# Patient Record
Sex: Male | Born: 1940
Health system: Southern US, Community
[De-identification: ages and names within clinical notes are randomized; demographics above are authoritative.]

## PROBLEM LIST (undated history)

## (undated) DIAGNOSIS — D649 Anemia, unspecified: Secondary | ICD-10-CM

## (undated) DIAGNOSIS — I471 Supraventricular tachycardia, unspecified: Secondary | ICD-10-CM

## (undated) DIAGNOSIS — F0781 Postconcussional syndrome: Secondary | ICD-10-CM

## (undated) DIAGNOSIS — M19019 Primary osteoarthritis, unspecified shoulder: Secondary | ICD-10-CM

## (undated) DIAGNOSIS — E538 Deficiency of other specified B group vitamins: Secondary | ICD-10-CM

## (undated) DIAGNOSIS — R0602 Shortness of breath: Secondary | ICD-10-CM

## (undated) DIAGNOSIS — E039 Hypothyroidism, unspecified: Secondary | ICD-10-CM

## (undated) DIAGNOSIS — E785 Hyperlipidemia, unspecified: Secondary | ICD-10-CM

## (undated) DIAGNOSIS — R7303 Prediabetes: Secondary | ICD-10-CM

## (undated) DIAGNOSIS — K219 Gastro-esophageal reflux disease without esophagitis: Secondary | ICD-10-CM

## (undated) DIAGNOSIS — M75100 Unspecified rotator cuff tear or rupture of unspecified shoulder, not specified as traumatic: Secondary | ICD-10-CM

## (undated) DIAGNOSIS — I10 Essential (primary) hypertension: Secondary | ICD-10-CM

## (undated) DIAGNOSIS — I2510 Atherosclerotic heart disease of native coronary artery without angina pectoris: Secondary | ICD-10-CM

## (undated) HISTORY — DX: Supraventricular tachycardia: I47.1

## (undated) HISTORY — DX: Hyperlipidemia, unspecified: E78.5

## (undated) HISTORY — PX: COLONOSCOPY W/ ENDOSCOPIC US: SHX1379

## (undated) HISTORY — PX: OTHER SURGICAL HISTORY: SHX169

## (undated) HISTORY — DX: Primary osteoarthritis, unspecified shoulder: M19.019

## (undated) HISTORY — DX: Atherosclerotic heart disease of native coronary artery without angina pectoris: I25.10

## (undated) HISTORY — PX: CARPAL TUNNEL RELEASE: SHX101

## (undated) HISTORY — DX: Essential (primary) hypertension: I10

## (undated) HISTORY — PX: JOINT REPLACEMENT: SHX530

## (undated) HISTORY — PX: NASAL SINUS SURGERY: SHX719

## (undated) HISTORY — PX: RESECTION TUMOR CLAVICLE RADICAL: SUR1255

## (undated) HISTORY — PX: TRIGGER FINGER RELEASE: SHX641

## (undated) HISTORY — DX: Unspecified rotator cuff tear or rupture of unspecified shoulder, not specified as traumatic: M75.100

## (undated) HISTORY — PX: LUMBAR LAMINECTOMY: SHX95

## (undated) HISTORY — DX: Supraventricular tachycardia, unspecified: I47.10

## (undated) HISTORY — PX: BACK SURGERY: SHX140

## (undated) HISTORY — DX: Prediabetes: R73.03

## (undated) HISTORY — DX: Gastro-esophageal reflux disease without esophagitis: K21.9

---

## 1969-06-27 HISTORY — PX: LUMBAR LAMINECTOMY: SHX95

## 1998-04-10 ENCOUNTER — Ambulatory Visit (HOSPITAL_COMMUNITY): Admission: RE | Admit: 1998-04-10 | Discharge: 1998-04-10 | Payer: Self-pay | Admitting: Orthopedic Surgery

## 1998-04-18 ENCOUNTER — Encounter: Admission: RE | Admit: 1998-04-18 | Discharge: 1998-07-17 | Payer: Self-pay | Admitting: Anesthesiology

## 2000-04-20 ENCOUNTER — Emergency Department (HOSPITAL_COMMUNITY): Admission: EM | Admit: 2000-04-20 | Discharge: 2000-04-20 | Payer: Self-pay | Admitting: Emergency Medicine

## 2000-04-20 ENCOUNTER — Encounter: Payer: Self-pay | Admitting: Emergency Medicine

## 2004-06-27 HISTORY — PX: CORONARY ANGIOPLASTY WITH STENT PLACEMENT: SHX49

## 2004-07-08 ENCOUNTER — Inpatient Hospital Stay (HOSPITAL_COMMUNITY): Admission: AD | Admit: 2004-07-08 | Discharge: 2004-07-10 | Payer: Self-pay | Admitting: Cardiology

## 2004-07-14 ENCOUNTER — Inpatient Hospital Stay (HOSPITAL_COMMUNITY): Admission: EM | Admit: 2004-07-14 | Discharge: 2004-07-16 | Payer: Self-pay | Admitting: Emergency Medicine

## 2004-07-16 ENCOUNTER — Encounter (INDEPENDENT_AMBULATORY_CARE_PROVIDER_SITE_OTHER): Payer: Self-pay | Admitting: *Deleted

## 2005-09-24 ENCOUNTER — Emergency Department (HOSPITAL_COMMUNITY): Admission: EM | Admit: 2005-09-24 | Discharge: 2005-09-24 | Payer: Self-pay | Admitting: Emergency Medicine

## 2006-02-25 ENCOUNTER — Ambulatory Visit: Payer: Self-pay | Admitting: Internal Medicine

## 2006-03-19 ENCOUNTER — Ambulatory Visit (HOSPITAL_BASED_OUTPATIENT_CLINIC_OR_DEPARTMENT_OTHER): Admission: RE | Admit: 2006-03-19 | Discharge: 2006-03-19 | Payer: Self-pay | Admitting: Orthopedic Surgery

## 2006-04-21 ENCOUNTER — Emergency Department (HOSPITAL_COMMUNITY): Admission: EM | Admit: 2006-04-21 | Discharge: 2006-04-21 | Payer: Self-pay | Admitting: Emergency Medicine

## 2006-04-24 ENCOUNTER — Ambulatory Visit: Payer: Self-pay | Admitting: Internal Medicine

## 2006-06-03 ENCOUNTER — Ambulatory Visit: Payer: Self-pay | Admitting: Internal Medicine

## 2006-06-03 ENCOUNTER — Encounter (INDEPENDENT_AMBULATORY_CARE_PROVIDER_SITE_OTHER): Payer: Self-pay | Admitting: *Deleted

## 2007-07-25 ENCOUNTER — Emergency Department (HOSPITAL_COMMUNITY): Admission: EM | Admit: 2007-07-25 | Discharge: 2007-07-25 | Payer: Self-pay | Admitting: Emergency Medicine

## 2007-07-29 ENCOUNTER — Ambulatory Visit (HOSPITAL_COMMUNITY): Admission: RE | Admit: 2007-07-29 | Discharge: 2007-07-29 | Payer: Self-pay | Admitting: Internal Medicine

## 2007-09-09 ENCOUNTER — Ambulatory Visit (HOSPITAL_BASED_OUTPATIENT_CLINIC_OR_DEPARTMENT_OTHER): Admission: RE | Admit: 2007-09-09 | Discharge: 2007-09-09 | Payer: Self-pay | Admitting: Orthopedic Surgery

## 2007-12-26 HISTORY — PX: REPLACEMENT TOTAL KNEE: SUR1224

## 2008-01-24 ENCOUNTER — Inpatient Hospital Stay (HOSPITAL_COMMUNITY): Admission: RE | Admit: 2008-01-24 | Discharge: 2008-01-28 | Payer: Self-pay | Admitting: Orthopedic Surgery

## 2008-01-25 ENCOUNTER — Encounter (INDEPENDENT_AMBULATORY_CARE_PROVIDER_SITE_OTHER): Payer: Self-pay | Admitting: *Deleted

## 2008-03-08 ENCOUNTER — Encounter (INDEPENDENT_AMBULATORY_CARE_PROVIDER_SITE_OTHER): Payer: Self-pay | Admitting: *Deleted

## 2008-12-19 ENCOUNTER — Ambulatory Visit (HOSPITAL_COMMUNITY): Admission: RE | Admit: 2008-12-19 | Discharge: 2008-12-19 | Payer: Self-pay | Admitting: Cardiology

## 2008-12-19 HISTORY — PX: CARDIAC CATHETERIZATION: SHX172

## 2009-10-27 DIAGNOSIS — F0781 Postconcussional syndrome: Secondary | ICD-10-CM

## 2009-10-27 HISTORY — DX: Postconcussional syndrome: F07.81

## 2010-03-21 ENCOUNTER — Encounter: Admission: RE | Admit: 2010-03-21 | Discharge: 2010-03-21 | Payer: Self-pay | Admitting: Neurology

## 2010-06-20 ENCOUNTER — Ambulatory Visit: Payer: Self-pay | Admitting: Cardiology

## 2010-10-01 ENCOUNTER — Encounter (INDEPENDENT_AMBULATORY_CARE_PROVIDER_SITE_OTHER): Payer: Self-pay | Admitting: *Deleted

## 2010-10-04 ENCOUNTER — Ambulatory Visit: Payer: Self-pay | Admitting: Cardiology

## 2010-10-09 ENCOUNTER — Telehealth (INDEPENDENT_AMBULATORY_CARE_PROVIDER_SITE_OTHER): Payer: Self-pay | Admitting: Radiology

## 2010-10-10 ENCOUNTER — Encounter: Payer: Self-pay | Admitting: Cardiovascular Disease

## 2010-10-10 ENCOUNTER — Encounter (HOSPITAL_COMMUNITY)
Admission: RE | Admit: 2010-10-10 | Discharge: 2010-11-26 | Payer: Self-pay | Source: Home / Self Care | Attending: Cardiology | Admitting: Cardiology

## 2010-10-10 ENCOUNTER — Encounter: Payer: Self-pay | Admitting: Cardiology

## 2010-10-10 ENCOUNTER — Ambulatory Visit: Payer: Self-pay

## 2010-10-27 HISTORY — PX: CARDIOVASCULAR STRESS TEST: SHX262

## 2010-11-25 ENCOUNTER — Ambulatory Visit
Admission: RE | Admit: 2010-11-25 | Discharge: 2010-11-25 | Payer: Self-pay | Source: Home / Self Care | Attending: Internal Medicine | Admitting: Internal Medicine

## 2010-11-25 ENCOUNTER — Encounter: Payer: Self-pay | Admitting: Internal Medicine

## 2010-11-25 DIAGNOSIS — K219 Gastro-esophageal reflux disease without esophagitis: Secondary | ICD-10-CM | POA: Insufficient documentation

## 2010-11-25 DIAGNOSIS — Z860100 Personal history of colon polyps, unspecified: Secondary | ICD-10-CM | POA: Insufficient documentation

## 2010-11-25 DIAGNOSIS — K5900 Constipation, unspecified: Secondary | ICD-10-CM | POA: Insufficient documentation

## 2010-11-25 DIAGNOSIS — I2510 Atherosclerotic heart disease of native coronary artery without angina pectoris: Secondary | ICD-10-CM | POA: Insufficient documentation

## 2010-11-25 DIAGNOSIS — R079 Chest pain, unspecified: Secondary | ICD-10-CM | POA: Insufficient documentation

## 2010-11-26 NOTE — Letter (Signed)
Summary: New Patient letter  [facility]  [address] [place], [place] [address]   Phone: [phone]  Fax: [phone]       10/01/2010 MRN: [id number]  [facility] [address] [place], [place]  [address]  Dear Johnny [name],  Welcome to the Gastroenterology Division at [facility].    You are scheduled to see Dr.  Marina Goodell on 11-25-10 at 10:00a.m. on the 3rd floor at [facility], [address]  We ask that you try to arrive at our office 15 minutes prior to your appointment time to allow for check-in.  We would like you to complete the enclosed self-administered evaluation form prior to your visit and bring it with you on the day of your appointment.  We will review it with you.  Also, please bring a complete list of all your medications or, if you prefer, bring the medication bottles and we will list them.  Please bring your insurance card so that we Degner make a copy of it.  If your insurance requires a referral to see a specialist, please bring your referral form from your primary care physician.  Co-payments are due at the time of your visit and Anger be paid by cash, check or credit card.     Your office visit will consist of a consult with your physician (includes a physical exam), any laboratory testing he/she [place] order, scheduling of any necessary diagnostic testing (e.g. x-ray, ultrasound, CT-scan), and scheduling of a procedure (e.g. Endoscopy, Colonoscopy) if required.  Please allow enough time on your schedule to allow for any/all of these possibilities.    If you cannot keep your appointment, please call [phone] to cancel or reschedule prior to your appointment date.  This allows [place] the opportunity to schedule an appointment for another patient in need of care.  If you do not cancel or reschedule by 5 p.m. the business day prior to your appointment date, you will be charged a $50.00 late cancellation/no-show fee.    Thank you for choosing  [facility] for your medical needs.  We appreciate the opportunity to care for you.  Please visit [place] at our website  to learn more about our practice.                     Sincerely,                                                             The Gastroenterology Division

## 2010-11-27 ENCOUNTER — Encounter: Payer: Self-pay | Admitting: Internal Medicine

## 2010-11-28 NOTE — Therapy (Signed)
Summary: Audiology  Audiology   Imported By: Pat Kocher 10/10/2010 09:02:49  _____________________________________________________________________  External Attachment:    Type:   Image     Comment:   External Document

## 2010-11-28 NOTE — Discharge Summary (Signed)
Summary: Discharge Summary   NAME:  Johnny [name], Johnny [name] NO.:  [id number]      MEDICAL RECORD NO.:  [id number]          PATIENT TYPE:  INP      LOCATION:  4743                         FACILITY:  [facility]      PHYSICIAN:  Mila Homer. Sherlean Foot, M.D. DATE OF BIRTH:  04-02-1941      DATE OF ADMISSION:  01/24/2008   DATE OF DISCHARGE:  01/28/2008                                  DISCHARGE SUMMARY      ADMISSION DIAGNOSES:   1. End-stage osteoarthritis bilateral knees, left worse than right.   2. Hypertension.   3. Coronary artery disease with history of stents on Plavix.   4. Hyperlipidemia.   5. Chronic vitamin B12 anemia.      DISCHARGE DIAGNOSES:   1. End-stage osteoarthritis bilateral knees status post left total       knee arthroplasty.   2. Acute blood loss anemia secondary to surgery.   3. Nausea/reflux.   4. Vertigo, now resolved.   5. Supraventricular tachycardia, now resolved.   6. Constipation.   7. Hypokalemia, now resolved.   8. Hypertension.   9. Coronary artery disease with history of stents on Plavix.   10.Hyperlipidemia.   11.Chronic pernicious anemia due to vitamin B12 deficiency.      SURGICAL PROCEDURE:  On January 24, 2008, Johnny [name] underwent a left total   knee arthroplasty by Dr. Mila Homer. Lucey assisted by Legrand Pitts. Duffy, PA-   C.  He had a NexGen All-Poly size 35 poly, 9 mm thickness placed with a   NexGen Legacy knee LPS femoral component size G left.  A NexGen Legacy   knee LPS articular surface size blue GH 10 mm height with a NexGen   fluted stemmed tibial component size 7.      COMPLICATIONS:  None.      CONSULTS:   1. Physical Therapy consult January 25, 2008.   2. Cardiology consult by Dr. Swaziland January 25, 2008, in addition to a       case management consult.   3. Occupational Therapy consult January 26, 2008.      HISTORY OF PRESENT ILLNESS:  This 70 year old white male patient   presented to Dr. Sherlean Foot with history of bilateral knee pain,  left worse   than right for more than 20 years.  The left knee pain is constant,   severe, sharp, dull, stabbing, and burning sensation which is getting   worse.  It awakens him at night.  Activity and work worsen his symptoms,   and he has failed conservative treatments.  X-rays show end-stage   arthritic changes of the knee and because of this he is presenting for a   left knee replacement.      HOSPITAL COURSE:  Johnny [name] tolerated his surgical procedure well without   immediate postoperative complications.  He was transferred to 5000.   Postop day 1, he was having some nausea and reflux symptoms and had   complained that his left leg fell off the bed while  he was in the PACU   the day before.  That was unable to be verified, but an x-ray was taken   to rule out fracture which was within normal limits.  He was started on   meds for reflux.  Hemovac was discontinued from the leg.  He was started   on therapy per protocol.  During that day while he was getting up with   therapy, he actually had a run of severe tachycardia.  Because of that,   a cardiology consult was obtained from Dr. Elvis Coil office and he   followed him throughout the hospitalization.      Postop day 2, a chest x-ray the day before showed low lung volumes.  CT   of the chest was negative for PE.  Cardiac enzymes were within normal   limits.  He did convert to normal sinus rhythm after being treated with   some adenosine, and he seemed to be stable from a cardiac standpoint.   His Foley was able to be discontinued, dressing was changed to the knee,   and incision was well-approximated with staples.  He was continued on   therapy and switched to p.o. pain meds.      On postop day #3, he continued to remain stable from a cardiac   standpoint.  T-max was 99.1.  Vitals were stable.  Hemoglobin 11.2,   hematocrit 32.3, white count 11.3.  Leg was neurovascularly intact.  He   has tolerated CPM and had some constipation which  was treated with a   laxative.  Cardiology felt he needed to be monitored for another day,   but then on February 24, 2008, he was ready for discharge home.  He was   afebrile at that time, vitals stable, doing well with therapy, felt he   was ready for discharge home, and discharged home later that day.      DISCHARGE INSTRUCTIONS:  Diet:  He can resume his regular   prehospitalization diet.      MEDICATIONS:  He Schmieder resume his home meds as follows.   1. Plavix 75 mg p.o. q.a.m.   2. Mobic is on hold at this time.   3. Monopril 3 mg half a tablet p.o. q.a.m.   4. Protonix 40 mg p.o. q.a.m.   5. Aspirin 81 mg is on hold at this time until his Lovenox is       completed.   6. Lipitor 10 mg p.o. q.a.m.   7. Metoprolol 25 mg p.o. b.i.d.   8. Hydroxyzine 25 mg p.o. q.a.m.   9. Meclizine 25 mg p.o. q.6 h. p.r.n.   10.He is to hold off on the Extra Strength Tylenol at this time.   11.Vitamin B12 shot monthly.   12.Amlodipine 10 mg p.o. q.a.m.      Additional meds at this time include:   1. Lovenox 40 mg subcu at 8 a.m., last dose on February 07, 2008, and he       can resume his aspirin on February 08, 2008.   2. Celebrex 200 mg 1 tablet p.o. b.i.d. for 1 week and then 1 q.a.m.       21 with no refill.   3. Percocet 5/325 1-2 tablets p.o. q.4 h. p.r.n. for pain 60 with no       refill.   4. Robaxin 500 mg 1-2 tablets p.o. q.6 h. p.r.n. for spasms 50 with no       refill.  ACTIVITY:  He can be out of bed, weightbearing as tolerated on the left   leg with use of a walker.  He is to have home CPM 0-90 degrees 6-8 hours   a day and home health PT per [facility].  Please see the   blue total knee discharge sheet for further activity instructions.      WOUND CARE:  He Meldrum shower after no drainage from the wound for 2 days.   Please see the blue total knee discharge sheet for further wound care   instructions.      FOLLOWUP:  He needs to follow up with Dr. Sherlean Foot in our office  on   Tuesday, February 08, 2008, and needs to call [phone] for that   appointment.      LABORATORY DATA:  Chest CT done on January 25, 2008, showed no signs of   pulmonary embolism and mild-to-moderate bibasilar atelectasis.      ABG done on January 25, 2008, on room air showed a pH of 7.434, pCO2 of   36.1, pO2 of 50.6, bicarb of 23.8, and total CO2 of 24.9.      Hemoglobin/hematocrit ranged from 17 and 49.8 on January 19, 2008, to 11.2   and 32.3 on January 27, 2008.  White count went from 8.3 on January 19, 2008,   to 15.2 on January 26, 2008, to 11.3 on January 27, 2008.  Platelets remained   within normal limits.      Sodium dropped to a low of 132 on January 26, 2008.  Potassium ranged from   4.5 on January 19, 2008, to 3.2 on January 27, 2008, to 3.3 on January 28, 2008.   Glucose ranged from 98 on January 19, 2008, to 144 on January 27, 2008, to   100 on January 28, 2008.      Calcium ranged from 10.1 on January 19, 2008, to a low of 8.1 on January 26, 2008, to 8.1 on January 28, 2008.      CK, CK-MB on January 25, 2008, at 15:15 was 296 and 5 with a troponin of   0.02; at 6:10 it was 327 and 5.4 with a troponin of 0.02; and on January 26, 2008, CK was 382, CK-MB 6, troponin 0.02.  All other laboratory   studies were within normal limits.               Legrand Pitts Duffy, P.A.         ______________________________   Mila Homer. Sherlean Foot, M.D.         KED/MEDQ  D:  03/08/2008  T:  03/09/2008  Job:  [id number]

## 2010-11-28 NOTE — Procedures (Signed)
Summary: Colonoscopy   Colonoscopy  Procedure date:  06/03/2006  Findings:      Location:  [facility].  Results: Polyp. Tubular Adenoma  Patient Name: [name], [name] MRN:  Procedure Procedures: Colonoscopy CPT: [phone].    with polypectomy. CPT: [id number].  Personnel: Endoscopist: Wilhemina Bonito. Marina Goodell, MD.  Referred By: Pearletha Furl Jacky Kindle, MD.  Exam Location: Exam performed in Outpatient Clinic. Outpatient  Patient Consent: Procedure, Alternatives, Risks and Benefits discussed, consent obtained, from patient. Consent was obtained by the RN.  Indications  Increased Risk Screening: Family History of Polyps.  History  Current Medications: Patient is not currently taking Coumadin.  Comments: Patient with drug eluting stent. He has elected to stay on plavix / asa for the exam Pre-Exam Physical: Performed Jun 03, 2006. Cardio-pulmonary exam, Rectal exam, HEENT exam , Abdominal exam, Mental status exam WNL.  Comments: Pt. history reviewed/updated, physical exam performed prior to initiation of sedation?yes Exam Exam: Extent of exam reached: Cecum, extent intended: Cecum.  The cecum was identified by appendiceal orifice and IC valve. Patient position: on left side. Time to Cecum: 00:03:10. Time for Withdrawl: 00:14:45. Colon retroflexion performed. Images taken. ASA Classification: III. Tolerance: excellent.  Monitoring: Pulse and BP monitoring, Oximetry used. Supplemental O2 given.  Colon Prep Used Miralax for colon prep. Prep results: excellent.  Sedation Meds: Patient assessed and found to be appropriate for moderate (conscious) sedation. Fentanyl 50 mcg. given IV. Versed 5 mg. given IV.  Findings NORMAL EXAM: Cecum to Rectum.  POLYP: Sigmoid Colon, diminutive, Procedure:  snare without cautery, removed, retrieved, Polyp sent to pathology. ICD9: Colon Polyps: 211. 3.   Assessment  Diagnoses: 211.3: Colon Polyps.   Events  Unplanned Interventions: No  intervention was required.  Unplanned Events: There were no complications. Plans Medication Plan: Continue current medications.  Disposition: After procedure patient sent to recovery. After recovery patient sent home.  Scheduling/Referral: Colonoscopy, to Wilhemina Bonito. Marina Goodell, MD, in 5 years if polyp adenomatous; otherwise 10 years,    This report was created from the original endoscopy report, which was reviewed and signed by the above listed endoscopist.   cc:  Geoffry Paradise, MD      Peter Swaziland, MD      The Patient

## 2010-11-28 NOTE — Progress Notes (Signed)
Summary: nuc pre-procedure  Phone Note Outgoing Call   Call placed by: Harlow Asa CNMT Call placed to: Patient Reason for Call: Confirm/change Appt Summary of Call: Left message with information on [employer] Information Sheet (see scanned document for details).      Nuclear Med Background Indications for Stress Test: Evaluation for Ischemia, Stent Patency, PTCA Patency   History: Angioplasty, Heart Catheterization, Myocardial Perfusion Study, Stents  History Comments: '05 ptca-LAD; '08 MPI inf. scar with partial reversiblility/ EF=54%  Symptoms: Chest Pain, DOE, Light-Headedness    Nuclear Pre-Procedure Cardiac Risk Factors: Family History - CAD, History of Smoking, Hypertension, Lipids

## 2010-11-28 NOTE — Assessment & Plan Note (Signed)
Summary: Cardiology Nuclear Testing  Nuclear Med Background Indications for Stress Test: Evaluation for Ischemia, Stent Patency, PTCA Patency   History: Angioplasty, Heart Catheterization, Myocardial Perfusion Study, Stents  History Comments: '05 ptca-LAD; '08 MPI inf. scar with partial reversiblility/ EF=54%  Symptoms: Chest Pain, DOE, Light-Headedness, Palpitations    Nuclear Pre-Procedure Cardiac Risk Factors: Family History - CAD, History of Smoking, Hypertension, Lipids Caffeine/Decaff Intake: None NPO After: 8:00 PM Lungs: clear IV 0.9% NS with Angio Cath: 22g     IV Site: R Antecubital IV Started by: Bonnita Levan, RN Chest Size (in) 48     Height (in): 72 Weight (lb): 204 BMI: 27.77  Nuclear Med Study 1 or 2 day study:  1 day     Stress Test Type:  Stress Reading MD:  Kristeen Miss, MD     Referring MD:  P. Swaziland Resting Radionuclide:  Technetium 68m Tetrofosmin     Resting Radionuclide Dose:  11.0 mCi  Stress Radionuclide:  Technetium 39m Tetrofosmin     Stress Radionuclide Dose:  33.0 mCi   Stress Protocol Exercise Time (min):  5:01 min     Max HR:  130 bpm     Predicted Max HR:  149 bpm  Max Systolic BP: 192 mm Hg     Percent Max HR:  87.25 %     METS: 7.0 Rate Pressure Product:  [address]    Stress Test Technologist:  Milana Na, EMT-P     Nuclear Technologist:  Domenic Polite, CNMT  Rest Procedure  Myocardial perfusion imaging was performed at rest 45 minutes following the intravenous administration of Technetium 59m Tetrofosmin.  Stress Procedure  The patient exercised for 5:01. The patient stopped due to fatigue and denied any chest pain.  There were no significant ST-T wave changes and occ pvcs/pac.  Technetium 77m Tetrofosmin was injected at peak exercise and myocardial perfusion imaging was performed after a brief delay.  QPS Raw Data Images:  Normal; no motion artifact; normal heart/lung ratio. Stress Images:  There is mild attenuation of the  entire inferior wall with normal uptake in the remaining walls. Rest Images:  There is mild attenuation of the entire inferior wall with normal uptake in the remaining walls. Subtraction (SDS):  No evidence of ischemia. Transient Ischemic Dilatation:  0.97  (Normal <1.22)  Lung/Heart Ratio:  0.22  (Normal <0.45)  Quantitative Gated Spect Images QGS EDV:  138 ml QGS ESV:  64 ml QGS EF:  54 % QGS cine images:  Normal LV function.  Findings Low risk nuclear study      Overall Impression  Exercise Capacity: Fair exercise capacity. BP Response: Normal blood pressure response. Clinical Symptoms: No chest pain ECG Impression: No significant ST segment change suggestive of ischemia. Overall Impression: Low risk stress nuclear study. Overall Impression Comments: There is no evidence of ischemia.  There is mild attenuation of the inferior wall that Horsman be due to diaphramatic attenuation.  The LV systolic function is normal.  Appended Document: Cardiology Nuclear Testing copy sent to Dr.Jordan

## 2010-12-04 NOTE — Assessment & Plan Note (Signed)
Summary: Chest pain, GERD, colon polyps   History of Present Illness Visit Type: Initial Consult Primary GI MD: Yancey Flemings MD Primary Provider: Geoffry Paradise, MD Requesting Provider:  Dr Geoffry Paradise Chief Complaint: Patient here to discuss having an endoscopy per Dr Lanell Matar request. Patient states that he has occasional chest pain and reflux although he thinks it is fairly well controlled on protonix. History of Present Illness:   70 year old white male with a history of hypertension, coronary artery disease status post coronary artery stent placement remotely, GERD, adenomatous colon polyps,and osteoarthritis. He is accompanied by his wife. He has had today regarding GERD and chest pain, and the need for upper endoscopy. Patient was last seen in August of 2007 when he underwent colonoscopy because of a family history of colon polyps. He was found to have sigmoid adenoma which was removed. Followup in 5 years recommended. In terms of chest pain, he states that he has had this since his stent placement 6 years ago. He does have chronic GERD for which he takes protonix. He describes chest pain as left-sided as well as the chest pressure. States he has this most days. He does note as this was certain activities. There is no effect with meals. No dysphagia. Increasing PPI b.i.d. does not help. He has undergone recent cardiac evaluation with Dr. Swaziland regarding his pain. He and his wife tell me that that evaluation was negative. His lower GI complaint is that of constipation for which he has been taking Dulcolax. No bleeding. He has come off Plavix in the past for procedure work including knee replacement.   GI Review of Systems    Reports acid reflux and  chest pain.      Denies abdominal pain, belching, bloating, dysphagia with liquids, dysphagia with solids, heartburn, loss of appetite, nausea, vomiting, vomiting blood, weight loss, and  weight gain.      Reports constipation.     Denies  anal fissure, black tarry stools, change in bowel habit, diarrhea, diverticulosis, fecal incontinence, heme positive stool, hemorrhoids, irritable bowel syndrome, jaundice, light color stool, liver problems, rectal bleeding, and  rectal pain.    Current Medications (verified): 1)  Cyanocobalamin 1000 Mcg/ml Soln (Cyanocobalamin) .[name].. 1 Injection Im Monthly 2)  Hydroxyzine Hcl 25 Mg Tabs (Hydroxyzine Hcl) .[name].. 1 By Mouth Once Daily 3)  Klor-Con 10 10 Meq Cr-Tabs (Potassium Chloride) .[name].. 1 By Mouth Once Daily 4)  Lipitor 10 Mg Tabs (Atorvastatin Calcium) .[name].. 1 By Mouth Once Daily 5)  Lisinopril 20 Mg Tabs (Lisinopril) .[name].. 1 By Mouth Once Daily 6)  Meclizine Hcl 25 Mg Tabs (Meclizine Hcl) .[name].. 1 By Mouth 2-3 Times Per Day As Needed 7)  Metoprolol Tartrate 25 Mg Tabs (Metoprolol Tartrate) .... Take 1 Tablet By Mouth Two Times A Day 8)  Mobic 15 Mg Tabs (Meloxicam) .[name].. 1 By Mouth Once Daily 9)  Plavix 75 Mg Tabs (Clopidogrel Bisulfate) .[name].. 1 By Mouth Once Daily 10)  Protonix 40 Mg Tbec (Pantoprazole Sodium) .[name].. 1 By Mouth Once Daily 11)  Aspirin 81 Mg Tbec (Aspirin) .[name].. 1 By Mouth Once Daily  Allergies (verified): No Known Drug Allergies  Past History:  Past Medical History: ASCAD IGT HTN Osteoarthritis Carpel Tunnel GERD Anemia Heart Arrythmia Chronic Headaches  Past Surgical History: Cardiac Stent Placement Back Surgery Bilateral Hand Surgery Sinus Surgery x 2 Left Knee Replacement  Family History: Family History of Colon Polyps: Brother? Family History of Diabetes: Father  Social History: Patient is a former smoker. -stopped  38 years ago Alcohol Use - no Illicit Drug Use - no  Review of Systems       The patient complains of arthritis/joint pain, headaches-new, hearing problems, heart rhythm changes, itching, muscle pains/cramps, and thirst - excessive.  The patient denies allergy/sinus, anemia, anxiety-new, back pain, blood in urine, breast changes/lumps, change  in vision, confusion, cough, coughing up blood, depression-new, fainting, fatigue, fever, heart murmur, menstrual pain, night sweats, nosebleeds, pregnancy symptoms, shortness of breath, skin rash, sleeping problems, sore throat, swelling of feet/legs, swollen lymph glands, urination - excessive, urination changes/pain, urine leakage, vision changes, and voice change.    Vital Signs:  Patient profile:   70 year old male Height:      72 inches Weight:      210.25 pounds BMI:     28.62 BSA:     2.18 Pulse rate:   72 / minute Pulse rhythm:   regular BP sitting:   150 / 80  (left arm)  Vitals Entered By: Lamona Curl CMA Duncan Dull) (November 25, 2010 9:36 AM)  Physical Exam  General:  Well developed, well nourished, no acute distress. Head:  Normocephalic and atraumatic. Eyes:  PERRLA, no icterus. Mouth:  No deformity or lesions. Neck:  Supple; no masses or thyromegaly. Lungs:  Clear throughout to auscultation. Heart:  Regular rate and rhythm; no murmurs, rubs,  or bruits. Abdomen:  Soft, nontender and nondistended. No masses, hepatosplenomegaly or hernias noted. Normal bowel sounds. Rectal:  deferred Msk:  Symmetrical with no gross deformities. Normal posture. Pulses:  Normal pulses noted. Extremities:  No clubbing, cyanosis, edema or deformities noted. Neurologic:  Alert and  oriented x4;  grossly normal neurologically. Skin:  Intact without significant lesions or rashes. Psych:  Alert and cooperative. Somewhat anxious   Impression & Recommendations:  Problem # 1:  CHEST PAIN UNSPECIFIED (ICD-786.50) chronic chest pain. History of coronary artery disease. Recent negative cardiac evaluation reported. History of chronic GERD. Uncertain the nature of his chest pain. Does not have specific GI complaints and does not respond to high-dose PPI. No prior upper endoscopy in a 70 year old white male with a greater than 20 year history of GERD.  Plan: #1. Upper endoscopy to evaluate  chest pain as well as provide Barrett's screening. The nature of the procedure as well as the risks, benefits, and alternatives were reviewed. He understood and agreed to proceed  Problem # 2:  GERD (ICD-530.81) Plan: #1. Reflux precautions #2. Continue Protonix #3. Upper endoscopy planned. See above  Problem # 3:  CONSTIPATION (ICD-564.00) likely functional. Last colonoscopy 5 years ago as described. Okay to use Ducolax on demand. Could also use MiraLax  Problem # 4:  PERSONAL HISTORY OF COLONIC POLYPS (ICD-V12.72) history of adenomatous colon polyp. Due for followup this year. We will schedule his surveillance colonoscopy at this time along with his upper endoscopy for his convenience. The nature of colonoscopy as well as risks, benefits, and alternatives were again reviewed. He understood and agreed to proceed. Movi prep prescribed. The patient instructed on its use. We will need to address his Plavix therapy. He is interested in holding Plavix if okay with his cardiologist, Dr. Swaziland. We will send Dr. Swaziland a letter regarding the feasibility of holding Plavix, while continuing aspirin, for his procedure work.  Problem # 5:  CAD (ICD-414.00) stable with negative and recent workup. Relevant issue is Plavix therapy and his procedure were. We will check with Dr. Swaziland to see if it is okay to hold Plavix for  about 5 days prior to his procedures, while continuing aspirin.  Other Orders: Colon/Endo (Colon/Endo)  Patient Instructions: 1)  Colon/Endo [facility] 12/17/10 2:00 pm arrive at 1:00 pm on 4th floor. 2)  Movi prep instructions given. 3)  Movi prep Rx sent to your pharmacy for you to pick up. 4)  We will send a letter to Dr. Peter Swaziland for approval to hold your Plavix x 5 days.   Stay on your Aspirin.   5)  Copy sent to : Geoffry Paradise, MD  Peter Swaziland, MD 6)  Colonoscopy and Flexible Sigmoidoscopy brochure given.  7)  Upper Endoscopy brochure given.  8)  The medication list was  reviewed and reconciled.  All changed / newly prescribed medications were explained.  A complete medication list was provided to the patient / caregiver. Prescriptions: MOVIPREP 100 GM  SOLR (PEG-KCL-NACL-NASULF-NA ASC-C) As per prep instructions.  #1 x 0   Entered by:   Milford Cage NCMA   Authorized by:   Hilarie Fredrickson MD   Signed by:   Milford Cage NCMA on 11/25/2010   Method used:   Electronically to        [facility]* (retail)       [address]       [place], [place]  [address]       Ph: [phone]       Fax: [phone]   RxID:   [id number]

## 2010-12-04 NOTE — Letter (Signed)
Summary: [facility]  [facility]  [address] [place], [place] [address]   Phone: [phone]  Fax: [phone]       Johnny [name]    [birth year]    MRN: [id number]        Procedure Day /Date:TUESDAY, 12/17/10     Arrival Time:1:00 PM     Procedure Time:2:00 PM     Location of Procedure:                    X  [facility] (4th Floor)   PREPARATION FOR COLONOSCOPY WITH MOVIPREP/ENDO   Starting 5 days prior to your procedure 12/12/10 do not eat nuts, seeds, popcorn, corn, beans, peas,  salads, or any raw vegetables.  Do not take any fiber supplements (e.g. Metamucil, Citrucel, and Benefiber).  THE DAY BEFORE YOUR PROCEDURE         DATE: 12/16/10  DAY: MONDAY  1.  Drink clear liquids the entire day-NO SOLID FOOD  2.  Do not drink anything colored red or purple.  Avoid juices with pulp.  No orange juice.  3.  Drink at least 64 oz. (8 glasses) of fluid/clear liquids during the day to prevent dehydration and help the prep work efficiently.  CLEAR LIQUIDS INCLUDE: Water Jello Ice Popsicles Tea (sugar ok, no milk/cream) Powdered fruit flavored drinks Coffee (sugar ok, no milk/cream) Gatorade Juice: apple, white grape, white cranberry  Lemonade Clear bullion, consomm, broth Carbonated beverages (any kind) Strained chicken noodle soup Hard Candy                             4.  In the morning, mix first dose of MoviPrep solution:    Empty 1 Pouch A and 1 Pouch B into the disposable container    Add lukewarm drinking water to the top line of the container. Mix to dissolve    Refrigerate (mixed solution should be used within 24 hrs)  5.  Begin drinking the prep at 5:00 p.m. The MoviPrep container is divided by 4 marks.   Every 15 minutes drink the solution down to the next mark (approximately 8 oz) until the full liter is complete.   6.  Follow completed prep with 16 oz of clear liquid of your choice (Nothing red or purple).  Continue  to drink clear liquids until bedtime.  7.  Before going to bed, mix second dose of MoviPrep solution:    Empty 1 Pouch A and 1 Pouch B into the disposable container    Add lukewarm drinking water to the top line of the container. Mix to dissolve    Refrigerate  THE DAY OF YOUR PROCEDURE      DATE: 12/17/10 DAY: TUESDAY  Beginning at 9:00 a.m. (5 hours before procedure):         1. Every 15 minutes, drink the solution down to the next mark (approx 8 oz) until the full liter is complete.  2. Follow completed prep with 16 oz. of clear liquid of your choice.    3. You Buehner drink clear liquids until 12 NOON (2 HOURS BEFORE PROCEDURE).   MEDICATION INSTRUCTIONS  Unless otherwise instructed, you should take regular prescription medications with a small sip of water   as early as possible the morning of your procedure.   Stop taking Plavix 12/12/10  (5 days before procedure).   WE WILL SEND DR. Swaziland A LETTER FOR APPROVAL TO  HOLD PLAVIX.  WE WILL CONTACT YOU REGARDING THIS.  IF YOU HAVEN'T HEARD FROM OUR OFFICE WITHIN 1 WEEK PRIOR TO PROCEDURE, PLEASE CALL OUR OFFICE.        OTHER INSTRUCTIONS  You will need a responsible adult at least 72 years of age to accompany you and drive you home.   This person must remain in the waiting room during your procedure.  Wear loose fitting clothing that is easily removed.  Leave jewelry and other valuables at home.  However, you Roberti wish to bring a book to read or  an iPod/MP3 player to listen to music as you wait for your procedure to start.  Remove all body piercing jewelry and leave at home.  Total time from sign-in until discharge is approximately 2-3 hours.  You should go home directly after your procedure and rest.  You can resume normal activities the  day after your procedure.  The day of your procedure you should not:   Drive   Make legal decisions   Operate machinery   Drink alcohol   Return to work  You will receive  specific instructions about eating, activities and medications before you leave.    The above instructions have been reviewed and explained to me by   _______________________    I fully understand and can verbalize these instructions _____________________________ Date _________

## 2010-12-04 NOTE — Letter (Signed)
Summary: Anticoagulation Modification Letter  [facility]  [address] [place], [place] [address]   Phone: [phone]  Fax: [phone]    November 25, 2010  Re:    Johnny [name] DOB:    1941/06/12 MRN:    [id number]    Dear Peter Swaziland, MD:  We have scheduled the above patient for an Endo/Colon procedure. Our records show that  he is on anticoagulation therapy. Please advise as to how long the patient [name] come off their therapy of Plavix prior to the scheduled procedure(s) on 12/17/10.   Please fax back/or route the completed form to Milford Cage, CMA at [phone].  Thank you for your help with this matter.  Sincerely,    Wilhemina Bonito. Marina Goodell, MD   Physician Recommendation:  Hold Plavix 5 days prior ________________   Other  Stay on Aspirin  Appended Document: Anticoagulation Modification Letter Ok'd by Dr. Swaziland to hold Plavix x 5 days and stay on Aspirin.  Pt. notified.

## 2010-12-12 NOTE — Letter (Signed)
Summary: Anticoag  Anticoag   Imported By: Lester Cordaville 12/04/2010 07:27:10  _____________________________________________________________________  External Attachment:    Type:   Image     Comment:   External Document

## 2010-12-17 ENCOUNTER — Encounter: Payer: Self-pay | Admitting: Internal Medicine

## 2010-12-17 ENCOUNTER — Encounter (AMBULATORY_SURGERY_CENTER): Payer: Medicare Other | Admitting: Internal Medicine

## 2010-12-17 DIAGNOSIS — Z1211 Encounter for screening for malignant neoplasm of colon: Secondary | ICD-10-CM

## 2010-12-17 DIAGNOSIS — Z860100 Personal history of colon polyps, unspecified: Secondary | ICD-10-CM

## 2010-12-17 DIAGNOSIS — K222 Esophageal obstruction: Secondary | ICD-10-CM

## 2010-12-17 DIAGNOSIS — K219 Gastro-esophageal reflux disease without esophagitis: Secondary | ICD-10-CM

## 2010-12-18 NOTE — Consult Note (Signed)
Summary: [facility]   Imported By: Sherian Rein 12/11/2010 06:56:08  _____________________________________________________________________  External Attachment:    Type:   Image     Comment:   External Document

## 2010-12-24 NOTE — Procedures (Signed)
Summary: Upper Endoscopy  Patient: [name] Note: All result statuses are Final unless otherwise noted.  Tests: (1) Upper Endoscopy (EGD)   EGD Upper Endoscopy       DONE     [facility]     [address]     [place], [place]  [address]           ENDOSCOPY PROCEDURE REPORT           PATIENT:  [name], Johnny  MR#:  [id number]     BIRTHDATE:  [birth year], 69 yrs. old  GENDER:  male           ENDOSCOPIST:  Wilhemina Bonito. Eda Keys, MD     Referred by:  Office           PROCEDURE DATE:  12/17/2010     PROCEDURE:  EGD, diagnostic [address]     ASA CLASS:  Class III     INDICATIONS:  GERD, Screening for Barrett's esopahgus in a GERD     patient., CHRONIC atypical chest pain           MEDICATIONS:   There was residual sedation effect present from     prior procedure.     TOPICAL ANESTHETIC:  Exactacain Spray           DESCRIPTION OF PROCEDURE:   After the risks benefits and     alternatives of the procedure were thoroughly explained, informed     consent was obtained.  The LB [id number]-H180 [id number] endoscope was     introduced through the mouth and advanced to the second portion of     the duodenum, without limitations.  The instrument was slowly     withdrawn as the mucosa was fully examined.     <<PROCEDUREIMAGES>>           A large caliber ring-like stricture was found in the distal     esophagus.  Otherwise normal esophagus without Barrett's.  The     stomach was entered and closely examined. The antrum, angularis,     and lesser curvature were well visualized, including a retroflexed     view of the cardia and fundus. The stomach wall was normally     distensable. The scope passed easily through the pylorus into the     duodenum.  The duodenal bulb was normal in appearance, as was the     postbulbar duodenum.    Retroflexed views revealed a small hiatal     hernia.    The scope was then withdrawn from the patient and the     procedure completed.           COMPLICATIONS:  None             ENDOSCOPIC IMPRESSION:     1) Stricture in the distal esophagus     2) Otherwise normal esophagus     3) Normal stomach     4) Normal duodenum     5) Gerd           RECOMMENDATIONS:     1) Anti-reflux regimen to be followed     2) Continue PPI (reflux medication0           _____________________________     Wilhemina Bonito. Eda Keys, MD           CC:  Geoffry Paradise, MD;Peter Swaziland, MD;The Patient           n.     eSIGNED:  Wilhemina Bonito. Eda Keys at 12/17/2010 03:08 PM           Johnny [name], Johnny [name], [id number]  Note: An exclamation mark (!) indicates a result that was not dispersed into the flowsheet. Document Creation Date: 12/17/2010 3:08 PM _______________________________________________________________________  (1) Order result status: Final Collection or observation date-time: 12/17/2010 15:01 Requested date-time:  Receipt date-time:  Reported date-time:  Referring Physician:   Ordering Physician: Fransico Setters [phone]) Specimen Source:  Source: Launa Grill Order Number: [id number] Lab site:

## 2010-12-24 NOTE — Procedures (Signed)
Summary: Colonoscopy  Patient: Johnny [name] Note: All result statuses are Final unless otherwise noted.  Tests: (1) Colonoscopy (COL)   COL Colonoscopy           DONE     [facility]     [address]     [place], [place]  [address]           COLONOSCOPY PROCEDURE REPORT           PATIENT:  [name], [name]  MR#:  [id number]     BIRTHDATE:  [birth year], 69 yrs. old  GENDER:  male     ENDOSCOPIST:  Wilhemina Bonito. Eda Keys, MD     REF. BY:  Office     PROCEDURE DATE:  12/17/2010     PROCEDURE:  Surveillance Colonoscopy     ASA CLASS:  Class III     INDICATIONS:  history of pre-cancerous (adenomatous) colon polyps,     surveillance and high-risk screening ; index 05-2006 w/ TAs     MEDICATIONS:   Fentanyl 75 mcg IV, Versed 7 mg IV           DESCRIPTION OF PROCEDURE:   After the risks benefits and     alternatives of the procedure were thoroughly explained, informed     consent was obtained.  Digital rectal exam was performed and     revealed no abnormalities.   The LB 180AL [id number] endoscope was     introduced through the anus and advanced to the cecum, which was     identified by both the appendix and ileocecal valve, without     limitations.Time to cecum = 5:00 min.  The quality of the prep was     excellent, using MoviPrep.  The instrument was then slowly     withdrawn (time =9:07 min) as the colon was fully examined.     <<PROCEDUREIMAGES>>           FINDINGS:  A normal appearing cecum, ileocecal valve, and     appendiceal orifice were identified. The ascending, hepatic     flexure, transverse, splenic flexure, descending, sigmoid colon,     and rectum appeared unremarkable.   Retroflexed views in the     rectum revealed internal hemorrhoids.    The scope was then     withdrawn from the patient and the procedure completed.           COMPLICATIONS:  None           ENDOSCOPIC IMPRESSION:     1) Normal colon     2) Internal hemorrhoids           RECOMMENDATIONS:     1) Follow up  colonoscopy in 5 years (hx adenomas)     2) RESUME PLAVIX TODAY           ______________________________     Wilhemina Bonito. Eda Keys, MD           CC:  Geoffry Paradise, MD;Peter Swaziland, MD;The Patient           n.     Rosalie DoctorWilhemina Bonito. Eda Keys at 12/17/2010 02:57 PM           [name], [name], [id number]  Note: An exclamation mark (!) indicates a result that was not dispersed into the flowsheet. Document Creation Date: 12/17/2010 2:57 PM _______________________________________________________________________  (1) Order result status: Final Collection or observation date-time: 12/17/2010 14:51 Requested date-time:  Receipt date-time:  Reported date-time:  Referring Physician:   Ordering Physician: Fransico Setters [phone]) Specimen Source:  Source: Launa Grill Order Number: [id number] Lab site:   Appended Document: Colonoscopy    Clinical Lists Changes  Observations: Added new observation of COLONNXTDUE: 11/2015 (12/18/2010 8:16)

## 2011-01-22 ENCOUNTER — Telehealth: Payer: Self-pay | Admitting: *Deleted

## 2011-01-22 NOTE — Telephone Encounter (Signed)
Called stating he is having rotator cuff surgery and needs to know if he should stop Plavix and ASA. Per Dr. Swaziland stop Plavix 5 days before surgery but needs to stay on ASA 81 mg

## 2011-01-23 ENCOUNTER — Telehealth: Payer: Self-pay | Admitting: *Deleted

## 2011-01-23 NOTE — Telephone Encounter (Signed)
Called stating he is having rotator cuff surgery and wants to know if should stop Plavix. Per Dr. Swaziland Knueppel stop 5 days prior to procedure but needs to continue ASA

## 2011-01-30 ENCOUNTER — Telehealth: Payer: Self-pay | Admitting: Cardiology

## 2011-01-30 NOTE — Telephone Encounter (Signed)
PRE SURGICAL TESTING AT  ASKING FOR: LAST OV NOTE,EKG,ANY CARDIAC TESTING THAT HAS  BEEN DONE (AS IN [facility] STUDY)

## 2011-02-04 ENCOUNTER — Other Ambulatory Visit: Payer: Self-pay | Admitting: Orthopedic Surgery

## 2011-02-04 ENCOUNTER — Encounter (HOSPITAL_COMMUNITY): Payer: Medicare Other

## 2011-02-04 LAB — CBC
MCH: 30.9 pg (ref 26.0–34.0)
MCHC: 34.4 g/dL (ref 30.0–36.0)
MCV: 89.8 fL (ref 78.0–100.0)
Platelets: 215 10*3/uL (ref 150–400)
RDW: 12.3 % (ref 11.5–15.5)
WBC: 8.3 10*3/uL (ref 4.0–10.5)

## 2011-02-04 LAB — BASIC METABOLIC PANEL
BUN: 15 mg/dL (ref 6–23)
Calcium: 9.5 mg/dL (ref 8.4–10.5)
Creatinine, Ser: 0.97 mg/dL (ref 0.4–1.5)
GFR calc non Af Amer: >60 mL/min (ref >60)
Potassium: 4.2 mEq/L (ref 3.5–5.1)

## 2011-02-04 LAB — SURGICAL PCR SCREEN: Staphylococcus aureus: POSITIVE — AB

## 2011-02-11 ENCOUNTER — Ambulatory Visit (HOSPITAL_COMMUNITY): Admission: RE | Admit: 2011-02-11 | Payer: Medicare Other | Source: Ambulatory Visit | Admitting: Orthopedic Surgery

## 2011-02-11 ENCOUNTER — Inpatient Hospital Stay (HOSPITAL_COMMUNITY)
Admission: RE | Admit: 2011-02-11 | Discharge: 2011-02-13 | DRG: 497 | Disposition: A | Discharge status: Home / Self Care | Payer: Medicare Other | Source: Ambulatory Visit | Attending: Orthopedic Surgery | Admitting: Orthopedic Surgery

## 2011-02-11 DIAGNOSIS — S43429A Sprain of unspecified rotator cuff capsule, initial encounter: Secondary | ICD-10-CM | POA: Diagnosis present

## 2011-02-11 DIAGNOSIS — Z9861 Coronary angioplasty status: Secondary | ICD-10-CM

## 2011-02-11 DIAGNOSIS — S43439A Superior glenoid labrum lesion of unspecified shoulder, initial encounter: Secondary | ICD-10-CM | POA: Diagnosis present

## 2011-02-11 DIAGNOSIS — S43499A Other sprain of unspecified shoulder joint, initial encounter: Secondary | ICD-10-CM | POA: Diagnosis present

## 2011-02-11 DIAGNOSIS — Z01818 Encounter for other preprocedural examination: Secondary | ICD-10-CM

## 2011-02-11 DIAGNOSIS — I10 Essential (primary) hypertension: Secondary | ICD-10-CM | POA: Diagnosis present

## 2011-02-11 DIAGNOSIS — M249 Joint derangement, unspecified: Principal | ICD-10-CM | POA: Diagnosis present

## 2011-02-11 DIAGNOSIS — M19019 Primary osteoarthritis, unspecified shoulder: Secondary | ICD-10-CM | POA: Diagnosis present

## 2011-02-11 DIAGNOSIS — M25819 Other specified joint disorders, unspecified shoulder: Secondary | ICD-10-CM | POA: Diagnosis present

## 2011-02-11 DIAGNOSIS — I2510 Atherosclerotic heart disease of native coronary artery without angina pectoris: Secondary | ICD-10-CM | POA: Diagnosis present

## 2011-02-11 DIAGNOSIS — X58XXXA Exposure to other specified factors, initial encounter: Secondary | ICD-10-CM | POA: Diagnosis present

## 2011-02-11 DIAGNOSIS — IMO0002 Reserved for concepts with insufficient information to code with codable children: Secondary | ICD-10-CM | POA: Diagnosis present

## 2011-02-11 HISTORY — PX: SHOULDER SURGERY: SHX246

## 2011-02-27 NOTE — Op Note (Signed)
Johnny [name], Johnny [name]                   ACCOUNT NO.:  [id number]  MEDICAL RECORD NO.:  [id number]           PATIENT TYPE:  I  LOCATION:  1614                         FACILITY:  [facility]  PHYSICIAN:  Marlowe Kays, M.D.  DATE OF BIRTH:  August 13, 1941  DATE OF PROCEDURE:  02/11/2011 DATE OF DISCHARGE:                              OPERATIVE REPORT   PREOPERATIVE DIAGNOSES: 1. Labral tear. 2. Suspected partial complete biceps tendon tear. 3. Acromioclavicular joint arthritis. 4. Rotator cuff tear, right shoulder.  POSTOPERATIVE DIAGNOSES: 1. Labral tear. 2. Suspected partial complete biceps tendon tear. 3. Acromioclavicular joint arthritis. 4. Rotator cuff tear, right shoulder.  OPERATIONS: 1. Right shoulder arthroscopy with labral debridement and debridement     of probable remnant of the biceps tendon. 2. Open distal clavicle resection. 3. Open anterior acromionectomy and repair of torn rotator cuff.  SURGEON:  Marlowe Kays, MD  ASSISTANT:  Druscilla Brownie. Idolina Primer, [place]  ANESTHESIA:  General.  INDICATION FOR PROCEDURE:  Because of painful right shoulder, MRI was done demonstrating preoperative diagnoses.  He has been cleared for surgery.  He has been off his Plavix for 5 days and it was felt that he could have the above-mentioned surgery.  PROCEDURE IN DETAIL:  Prophylactic antibiotics, satisfactory general anesthesia, beach-chair position on the Allen frame, right shoulder was prepped with DuraPrep and draped in sterile field.  Time-out performed. Anatomy of the shoulder joint was marked out as well as planned incision for the distal clavicle resection and rotator cuff repair.  Through a posterior soft spot portal entered atraumatically into the glenohumeral joint.  Initially, there was a large amount of clotted blood, secondary to his Plavix which Auletta not have been cleared from the system completely.  By flushing the joint, I was eventually able to clear most of the clotted  material and enough visualization to see a very frayed labrum.  I really could not make out the interval between the biceps tendon subscapularis but advanced the scope anteriorly using a switching stick, made an anterior incision, and over the switching stick, I placed a metal cannula in the joint followed by 4.2 shaver.  This allowed me to clear out the clotted material and shave down the labrum, what probably was the remnant of the biceps tendon I shaved down as well.  We then kept the camera operative and I made incision over the distal clavicle curving down over the anterior acromion.  The very arthritic AC joint was identified and measured centimeter and a half medial to it and then undermined the clavicle at this point using micro saw and protecting the underlying tissues, amputated clavicle at this point.  I then removed this with towel clip and cutting cautery technique.  Applied bone wax to the raw bone.  I then continued the dissection distally with subperiosteal dissection using cutting cautery, exposing the anterior acromion, which I undermined with a small Cobb elevator and joint fluid came forth confirming the tear.  I placed a larger Cobb elevator and made initial anterior acromionectomy with micro saw.  I then supplemented this with another pass with the saw  and using small rongeur and rasp, widely decompressed the subacromial space.  A triangular- shaped rotator cuff tear with retraction was noted.  It is about 2 cm at the base, it is about 2 cm at the apex.  The posterior wing of this was quite thinned out and also noted vertical tear way up beneath the distal clavicle.  After roughening up the area just above the greater tuberosity with a small rongeur, I used a 4-strand Stryker anchor, tacking down the rotator cuff.  I then used the same suture with multiple side-to-side sutures including several side-to-side sutures on the surface of the clavicle.  This seemed to give  a nice stable repair with his arm to a side.  Pre and post repair pictures were also taken. I then irrigated the wound well with sterile saline and placed Gelfoam in the distal clavicle resection site and then closed the wound with interrupted #1 Vicryl in the fascia over the distal clavicle and acromion, also repairing the small slit at the deltoid muscle.  This was with #1 Vicryl, then closed the subcutaneous tissue with 2-0 Vicryl, Steri-Strips on the skin except for the 2 portal sites which I closed with 4-0 nylon.  Betadine and Adaptic was placed on them.  Dry sterile dressing to the shoulder, followed by shoulder immobilizer.  He tolerated the procedure well and was taken to recovery in satisfactory condition with no known complications.  Minimal bleeding and no blood replacement.          ______________________________ Marlowe Kays, M.D.     JA/MEDQ  D:  02/11/2011  T:  02/12/2011  Job:  [id number]  Electronically Signed by Marlowe Kays M.D. on 02/27/2011 12:35:38 PM

## 2011-03-11 NOTE — H&P (Signed)
NAMEMarland Kitchen  Johnny, [name] NO.:  [id number]   MEDICAL RECORD NO.:  [id number]           PATIENT TYPE:   LOCATION:                                 FACILITY:   PHYSICIAN:  Johnny [name], M.D.  DATE OF BIRTH:  1941/02/07   DATE OF ADMISSION:  DATE OF DISCHARGE:                              HISTORY & PHYSICAL   HISTORY OF PRESENT ILLNESS:  [name] is a 70 year old white male who has  a history of coronary artery disease and supraventricular tachycardia.  He presents with a 45-month history of increased dyspnea on exertion  associated with chest tightness and a sharp left parasternal pain.  The  symptoms are consistent with angina.  He is status post stenting of the  LAD in August 2005 with a 3.5 x 13-mm Cypher stent.  He subsequently had  stress Cardiolite studies that have been equivocal showing evidence of  inferoseptal infarction and/or ischemia that did not correlate with his  cardiac catheterization findings.  Given these recent onset of symptoms  that are typical for angina it was felt that he would be best served by  a diagnostic cardiac catheterization since stress testing would likely  still be equivocal.   PAST MEDICAL HISTORY:  1. History of supraventricular tachycardia probably AV node reentrant      tachycardia that has been treated with beta blocker therapy.  2. Coronary artery disease as noted above.  3. Hypertension.  4. Hyperlipidemia.  5. History of mild glucose intolerance.   The patient has had previous lumbar laminectomy and previous total knee  replacement.   ALLERGIES:  He has no known allergies.   CURRENT MEDICATIONS:  1. Metoprolol 25 mg b.i.d.  2. Monopril 25 mg daily.  3. Plavix 75 mg daily.  4. Aspirin 81 mg daily.  5. Potassium 10 mEq daily.  6. Protonix 40 mg per day.  7. Lipitor 10 mg per day.  8. Mobic 15 mg at bedtime.  9. Hydroxyzine 25 mg at bedtime.  10.Meclizine 25 mg p.r.n.   SOCIAL HISTORY:  The patient is married.   He quit smoking over 30 years  ago.  He does not drink alcohol.  He is retired, previously worked as a  Advertising account executive.   FAMILY HISTORY:  Father died of diabetes and renal failure at age 46.  Mother died at age 56 with a stroke.  He has had four brothers who are  alive and well.  One sister is alive and well.  He has two children who  are alive and well.   REVIEW OF SYSTEMS:  He denies any symptoms of claudication.  He has had  no history of TIA or stroke.  He has had no bleeding problems.  He does  have some intermittent vertigo.  He has had persistent knee pain despite  his knee replacement surgery.  He has had no orthopnea, PND or edema.  No change in appetite or weight.  No change in bowel or bladder habits.  All other systems are reviewed and are negative.   PHYSICAL EXAMINATION:  GENERAL:  The patient is a pleasant white male in  no apparent distress.  VITAL SIGNS:  His weight is 213, blood pressure is 118/70, pulse 64 and  regular, respirations were normal.  HEENT:  He is normocephalic, atraumatic.  His pupils are equal, round,  and reactive to light and accommodation.  Sclerae clear.  Oropharynx is  clear.  NECK:  Supple without JVD, adenopathy, thyromegaly or bruits.  LUNGS:  Clear to auscultation and percussion.  CARDIAC:  A regular rate and rhythm.  Normal S1-S2 without gallop,  murmur, rub or click.  ABDOMEN:  Soft and nontender.  He has no hepatosplenomegaly, mass or  bruits.  Bowel sounds are positive.  EXTREMITIES:  Without edema.  Femoral and pedal pulses are 2+ and  symmetric.  There is no cyanosis.  SKIN:  Warm and dry.  NEUROLOGIC:  He is alert and oriented x4.  Cranial nerves II-XII are  intact.  His muscle strength is normal.   LABORATORY DATA:  ECG shows normal sinus rhythm with left ventricular  hypertrophy and nonspecific T-wave abnormality.  Chest x-ray shows no  active disease.   IMPRESSION:  1. New onset of dyspnea on exertion and chest  tightness consistent      with angina pectoris.  2. Coronary artery disease status post stenting of the left anterior      descending in 2005.  3. History of supraventricular tachycardia.  4. Hypertension.  5. Hyperlipidemia.  6. Status post left total knee replacement.   PLAN:  We will proceed with diagnostic cardiac catheterization with  further therapy pending these results.           ______________________________  Johnny [name], M.D.     PMJ/MEDQ  D:  12/15/2008  T:  12/16/2008  Job:  [id number]   cc:   Johnny [name], M.D.

## 2011-03-11 NOTE — Cardiovascular Report (Signed)
NAMEMarland [name]  [name], [name] NO.:  [id number]   MEDICAL RECORD NO.:  [id number]          PATIENT TYPE:  OIB   LOCATION:  2899                         FACILITY:  [facility]   PHYSICIAN:  Peter M. Swaziland, M.D.  DATE OF BIRTH:  1940/12/29   DATE OF PROCEDURE:  12/19/2008  DATE OF DISCHARGE:                            CARDIAC CATHETERIZATION   INDICATIONS FOR PROCEDURE:  Mr. Tirpak is a 70 year old white male with a  history of coronary artery disease status post stenting of the proximal  LAD in September 2005.  He experienced some recent symptoms of dyspnea  on exertion and substernal chest tightness.  Previous Cardiolite studies  have been equivocal.  The patient does have a history of hypertension  and hypercholesterolemia.   PROCEDURES:  Left heart catheterization, coronary and left ventricular  angiography.   ACCESS:  Via the right femoral artery using standard Seldinger  technique.   EQUIPMENT:  A 6-French 4-cm right and left Judkins catheter, 6-French  pigtail catheter, and 6-French arterial sheath.   MEDICATIONS:  Local anesthesia 1% Xylocaine and Versed 2 mg IV.   CONTRAST:  90 mL of Omnipaque.   HEMODYNAMIC DATA:  Aortic pressure was 150/76 with a mean of 105 mmHg.  Left ventricular pressure was 148 with EDP of 8 mmHg.   ANGIOGRAPHIC DATA:  Left coronary artery arises and distributes in the  dominant fashion.  The left main coronary artery is short and normal.   The left anterior descending artery is a large vessel.  There is a stent  in the proximal vessel that is widely patent with 0% stenosis.  There is  20% narrowing in the LAD following the stent.  The remainder LAD is  without significant disease.  The first diagonal has a high takeoff and  has a 30% narrowing in the ostium.   The left circumflex coronary is a large dominant vessel and has minor  irregularities less than 10% proximally, otherwise appears normal.   The right coronary artery is a  nondominant vessel and is small and is  normal.   Left ventricular angiography was performed in the RAO view.  This  demonstrates normal left ventricular size and contractility with normal  systolic function.  Ejection fraction is estimated at 55%.   FINAL INTERPRETATION:  1. There is mild nonobstructive coronary artery disease.  The stent in      the proximal left anterior descending is still widely patent.  2. Normal left ventricular function.           ______________________________  Peter M. Swaziland, M.D.     PMJ/MEDQ  D:  12/19/2008  T:  12/19/2008  Job:  [id number]   cc:   Geoffry Paradise, M.D.

## 2011-03-11 NOTE — Op Note (Signed)
NAME:  Johnny, [name]                   ACCOUNT NO.:  [id number]   MEDICAL RECORD NO.:  [id number]          PATIENT TYPE:  AMB   LOCATION:  DSC                          FACILITY:  [facility]   PHYSICIAN:  Katy Fitch. Sypher, M.D. DATE OF BIRTH:  1941/08/27   DATE OF PROCEDURE:  09/09/2007  DATE OF DISCHARGE:  09/09/2007                               OPERATIVE REPORT   PREOPERATIVE DIAGNOSIS:  1. Chronic left median nerve entrapment neuropathy at wrist.  2. Chronic right ring finger stenosing tenosynovitis.  3. Right index finger metacarpal phalangeal joint degenerative      arthritis with pain.   POSTOPERATIVE DIAGNOSIS:  1. Chronic left median nerve entrapment neuropathy at wrist.  2. Chronic right ring finger stenosing tenosynovitis.  3. Right index finger metacarpal phalangeal joint degenerative      arthritis with pain.   OPERATION:  1. Release of left transverse carpal ligament.  2. Injection of right ring finger flexor sheath.  3. Injection of the right index finger metacarpal phalangeal joint to      try to palliate degenerative arthritis pain.   SURGEON:  Katy Fitch. Sypher, M.D.   ASSISTANT:  Marveen Reeks. Dasnoit, P.A.-C.   ANESTHESIA:  General by LMA.   SUPERVISING ANESTHESIOLOGIST:  Janetta Hora. Gelene Mink, M.D.   INDICATIONS:  Johnny [name] is a 70 year old gentleman referred through the  courtesy of Dr. Geoffry Paradise of [facility] for  evaluation of left hand numbness and right hand pain.  Clinical  examination revealed evidence of left carpal tunnel syndrome and severe  arthritis of the right index finger MP joint and stenosing tenosynovitis  of the right ring finger flexor tendons at the A1 pulley.  He also had  early Dupuytren's contracture involving the pretendinous fibers to the  right ring finger.  Electrodiagnostic studies of his hands confirmed  significant left carpal tunnel syndrome.   After informed consent, Johnny [name] is brought to the operating room at  this  time anticipating release of his left transverse carpal ligament to  treat his numbness, injection of his right ring finger flexor sheath to  try to relieve his stenosing tenosynovitis symptoms, and after lengthy  informed consent in the office, injection of the index MP joint to try  to palliate pain due to arthritis.  At some point in the future, he [name]  benefit from implant arthroplasty of the right index finger metacarpal  phalangeal joint.   PROCEDURE:  Johnny [name] is brought to the operating room and placed in the  supine position on the operating table.  Dr. Gelene Mink provided a  preoperative anesthesia consult and reviewed his medical records  including his cardiology records from [facility]/Dr. Peter Swaziland.  General anesthesia by LMA technique was  recommended and accepted.  Johnny [name] is brought to room 8 and placed in  the supine position on the operating table.  Due to the fact that he had  a healing burn in the dorsal aspect of his left hand, 1 gram of Ancef  was administered as an IV prophylactic antibiotic.  It  should be noted  that there was no sign of cellulitis or any lymphangitis.  The left arm  was prepped with Betadine soap solution and sterilely draped.  A  pneumatic tourniquet was applied to the proximal left brachium.  Following exsanguination of the left arm with an Esmarch bandage, the  arterial tourniquet inflated to 220 mmHg.   The procedure commenced with a 15 mm incision in line of the ring finger  and the palm.  The subcutaneous tissues were carefully divided down to  the palmar fascia.  This was split longitudinally to the common sensory  branch of the median nerve.  They were followed back to the median nerve  proper.  The median nerve was gently separated from the transverse  carpal ligament with a Insurance risk surveyor.  The transverse carpal  ligament was then released with scissors along its ulnar border  extending into the  distal forearm.  This widely opened the carpal canal.  The ulnar bursa was thickened and fibrotic.  No masses or other  predicaments were appreciated.  The wound was then repaired with  intradermal 3-0 Prolene suture.   Attention was then directed to the right hand.  A syringe with a mixture  of Depo-Medrol 40 mg per mL and 1.5 mL of 2% plain lidocaine was used to  infiltrate a total volume of 1.2 mL into the right ring finger flexor  sheath.  The technique was as follows:  The finger was fully flexed, the  A1 pulley palpated, the tendon sheath was instrumented directly through  the A1 pulley with a 27 gauge needle.  The finger was fully extended  disengaging the needle tip from the flexor tendons.  The flexor sheath  was injected with the mixture creating distention of the sheath for half  of the injection and the remaining half placed superficial to the sheath  at the A1 pulley.  The needle was withdrawn and a Band-Aid applied.   The index finger was then prepared on its dorsal surface of the MP  joint.  By palpation, the joint margin was identified.  A 27 gauge  needle was used to instrument the joint through the ulnar sagittal  fibers assuring that the joint capsule was penetrated.  A mixture of 1  mL of 50/50 Depo-Medrol 40 mg per mL and 2% lidocaine was infiltrated  with capsular distention.  There were no apparent complications.  This  wound was cleaned with sterile saline followed by placement of a Band-  Aid.   Johnny [name] tolerated the surgery and anesthesia well.  His left hand wound  was dressed with Steri-Strips, sterile gauze, sterile Webril, and a  volar plaster splint maintaining the wrist in 5 degrees dorsiflexion.  He was transferred to the recovery room with stable vital signs.  For  aftercare, he is provided a prescription for Percocet 5 mg one p.o. q.4-  6h. p.r.n. pain, 20 tablets without refill.  I will see him back for  follow up in one week in the office or sooner  if problems occur.      Katy Fitch Sypher, M.D.  Electronically Signed     RVS/MEDQ  D:  09/09/2007  T:  09/09/2007  Job:  [id number]

## 2011-03-11 NOTE — Consult Note (Signed)
NAMEMarland Kitchen  Johnny [name], Johnny [name] NO.:  [id number]   MEDICAL RECORD NO.:  [id number]          PATIENT TYPE:  INP   LOCATION:  4743                         FACILITY:  [facility]   PHYSICIAN:  Peter M. Swaziland, M.D.  DATE OF BIRTH:  Apr 09, 1941   DATE OF CONSULTATION:  01/25/2008  DATE OF DISCHARGE:                                 CONSULTATION   HISTORY OF PRESENT ILLNESS:  [name] is a 70 year old white male who is  seen for evaluation of tachycardia.  He is status post left total knee  replacement yesterday.  This morning he was noted to have a rapid  heartbeat.  He felt weak.  He had some vague chest discomfort.  He  denied any shortness of breath.  An ECG was obtained and documented  supraventricular tachycardia with a rate of 140 beats per minute.  There  were no ST or T-wave changes.  The patient was transferred to telemetry.  He was subsequently given 6 mg of IV adenosine, without effect.  He was  given then 12 mg of IV adenosine, with prompt conversion to normal sinus  rhythm with a rate of 100.  His blood pressure was stable at 130/70.  The patient has no prior history of arrhythmia.   PAST MEDICAL HISTORY:  1. Coronary artery disease, status post stenting of the proximal to      mid-LAD in September 2005 with a 3.5 x 13-mm Cypher stent.  The      patient had a stress Cardiolite study in [place] of 2008 which showed a      small, partially-reversible defect in the inferior wall.  The      anterior wall appeared normal.  This was unchanged from June of      2006.  Left ventricular function was normal.  2. Hyperlipidemia.  3. Hypertension.  4. Carpal tunnel syndrome.  5. Glucose intolerance.  6. Status post lumbar laminectomy.  7. History of sinus surgery x2.   ALLERGIES:  HE HAS NO KNOWN ALLERGIES.   CURRENT MEDICATIONS:  Include metoprolol 25 mg b.i.d., amlodipine 10 mg  per day, hydroxyzine 25 mg q.h.s., Monopril 20 mg per day, Protonix 40  mg per day, aspirin 81 mg per  day, Plavix 75 mg per day, Lipitor 10 mg  per day, Mobic daily, and Lovenox 30 mg subcu q.12 h.   SOCIAL HISTORY:  The patient is married.  He is a Advertising account executive.  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  He has had a remote history of tobacco use.  He  denies alcohol use.   FAMILY HISTORY:  Mother died age 35 with a stroke.  Father died at age  12 of kidney failure.   REVIEW OF SYSTEMS:  His review of systems is otherwise noncontributory.   PHYSICAL EXAMINATION:  The patient is a well-developed white male in no  apparent distress.  Blood pressure is 130/70, pulse is 100, sinus rhythm.  He is afebrile.  HEENT:  Exam is unremarkable.  He appears to be mildly sweaty.  NECK:  Supple without JVD, adenopathy, thyromegaly or  bruits.  Lungs were clear anteriorly.  CARDIAC:  Exam reveals a regular rate and rhythm without gallop, murmur  or click.  ABDOMEN:  Soft, nontender.  He has no edema.  Pedal pulses were good.  NEUROLOGIC:  Exam is intact.   LABORATORY DATA:  ECG showed supraventricular tachycardia, rate of 140,  otherwise normal.  Sodium is 137, potassium 3.8, chloride 103, CO2 25,  BUN 11, creatinine 0.98, glucose 125, white count 13,500, hemoglobin  12.3, hematocrit 35.6, platelets 188,000.  Arterial blood gas showed a  pH of 7.43, pCO2 of 36, pO2 of 51, bicarb of 24.  Troponin was 0.02 and  CK was 296 with 5 MB.   IMPRESSION:  1. Supraventricular tachycardia, new onset, now converted after      adenosine.  2. Coronary disease with prior stenting of the left anterior      descending.  3. Status post left total knee replacement.  4. Hypertension.  5. Hyperlipidemia.   PLAN:  Will repeat a 12-lead ECG now post conversion and will obtain  serial cardiac enzymes.  I would recommend proceeding with a chest CT  with contrast.  The patient will need to be maintained on telemetry.  Monitor.  Will increase his metoprolol to 50 mg b.i.d.           ______________________________  Peter M.  [place]iland, M.D.     PMJ/MEDQ  D:  01/25/2008  T:  01/25/2008  Job:  [id number]   cc:   Mila Homer. Sherlean Foot, M.D.  Geoffry Paradise, M.D.

## 2011-03-11 NOTE — Op Note (Signed)
NAMEMarland Kitchen  [name], Johnny NO.:  [id number]   MEDICAL RECORD NO.:  [id number]          PATIENT TYPE:  INP   LOCATION:  2550                         FACILITY:  [facility]   PHYSICIAN:  Mila Homer. Sherlean Foot, M.D. DATE OF BIRTH:  05-May-1941   DATE OF PROCEDURE:  01/24/2008  DATE OF DISCHARGE:                               OPERATIVE REPORT   SURGEON:  Dr. Sherlean Foot.   ASSISTANT:  Arnoldo Morale, PA.   ANESTHESIA:  General.   PREOPERATIVE DIAGNOSIS:  Left knee osteoarthritis.   POSTOPERATIVE DIAGNOSIS:  Left knee osteoarthritis.   PROCEDURE:  Left total knee arthroplasty.   INDICATIONS FOR PROCEDURE:  The patient is a 70 year old white male with  failure of conservative measures for osteoarthritis of the left knee.  Informed consent was obtained.   DESCRIPTION OF PROCEDURE:  The patient was given general anesthesia.  A  Foley catheter place was placed.  The left leg was prepped and draped in  the usual sterile fashion.  The extremity was exsanguinated with Esmarch  and tourniquet inflated to 350 mmHg to set for an hour.  A #10 blade was  used to make a midline incision 6 inches in length.  A new 10 blade was  used to make a median parapatellar arthrotomy to perform a synovectomy.  I elevated the deep MCL off the medial crest of the tibia around to and  through the semimembranosus attachment.  I then everted the patella that  measured 25-mm thick.  I reamed down 9 mm and drilled through three lug  holes through 30- to 5-mm template.  With the 35-trial in place, we  recreated 25-mm thickness.  I then subluxed the patella lateral and it  went into flexion.  I used the intramedullary guide on the tibia to make  a perpendicular cut to the anatomic axis of the tibia.  I used the  intramedullary guide on the femur to make an intramedullary hole and  then placed the guide on 6 degrees.  I pinned the distal femoral cutting  block in place and made the cut with a sagittal saw after pinning  it.  I  then marked out the epicondylar angle and the posterior condylar angle  measured 5 degrees and was sized to a size G and pinned through the 5-  degree external rotation holes.  I then made the anterior-posterior  chamfer cuts with the femoral cutter.  I then placed a lamina spreader  in the knee and removed the ACL, PCL, medial lateral menisci, posterior  condylar osteophytes.  I then placed a 10-mm spacer block in the knee  and had good flexion/extension gap balance.  I then finished the femur  with a size G finishing block and finished the tibia with a size 7  tibial tray drilling keel and then trialed with a 7  tibia, G femur, 10  insert, 35 patella, and had good flexion/extension gap balance, dropping  angle back to 125 degrees easily.  I then removed the trial cuts and  copiously irrigated.  I cemented the components and removed excess  cement.  Once the cement was hardened, I dropped the tourniquet and let  tourniquet down, obtained hemostasis, and copiously irrigated once  again.  The left knee Hemovac coming out superolateral and deep to the  arthrotomy, and pain catheter coming out superficial to the arthrotomy  and superomedially.  I closed the arthrotomy with figure-of-eight 1-  Vicryl sutures and closed deep soft tissue with 0-Vicryl sutures.  I  closed the subcuticular layer with a running 2-0 Vicryl.  I closed the  skin with skin staples and dressed it with Xeroform, dressing sponges,  sterile Webril, and TED stocking.   COMPLICATIONS:  None.   DRAINS:  Hemovac and pain catheter.   ESTIMATED BLOOD LOSS:  300 mL.   TOURNIQUET TIME:  53 minutes.           ______________________________  Mila Homer Sherlean Foot, M.D.     SDL/MEDQ  D:  01/24/2008  T:  01/24/2008  Job:  [id number]

## 2011-03-14 NOTE — Discharge Summary (Signed)
NAMEMarland Kitchen  Johnny [name], Johnny [name] NO.:  [id number]   MEDICAL RECORD NO.:  [id number]          PATIENT TYPE:  INP   LOCATION:  4743                         FACILITY:  [facility]   PHYSICIAN:  Mila Homer. Sherlean Foot, M.D. DATE OF BIRTH:  Jun 17, 1941   DATE OF ADMISSION:  01/24/2008  DATE OF DISCHARGE:  01/28/2008                               DISCHARGE SUMMARY   ADMISSION DIAGNOSES:  1. End-stage osteoarthritis bilateral knees, left worse than right.  2. Hypertension.  3. Coronary artery disease with history of stents on Plavix.  4. Hyperlipidemia.  5. Chronic vitamin B12 anemia.   DISCHARGE DIAGNOSES:  1. End-stage osteoarthritis bilateral knees status post left total      knee arthroplasty.  2. Acute blood loss anemia secondary to surgery.  3. Nausea/reflux.  4. Vertigo, now resolved.  5. Supraventricular tachycardia, now resolved.  6. Constipation.  7. Hypokalemia, now resolved.  8. Hypertension.  9. Coronary artery disease with history of stents on Plavix.  10.Hyperlipidemia.  11.Chronic pernicious anemia due to vitamin B12 deficiency.   SURGICAL PROCEDURE:  On January 24, 2008, Johnny [name] underwent a left total  knee arthroplasty by Dr. Mila Homer. Lucey assisted by Legrand Pitts. Duffy, PA-  C.  Johnny had a NexGen All-Poly size 35 poly, 9 mm thickness placed with a  NexGen Legacy knee LPS femoral component size G left.  A NexGen Legacy  knee LPS articular surface size blue GH 10 mm height with a NexGen  fluted stemmed tibial component size 7.   COMPLICATIONS:  None.   CONSULTS:  1. Physical Therapy consult January 25, 2008.  2. Cardiology consult by Dr. Swaziland January 25, 2008, in addition to a      case management consult.  3. Occupational Therapy consult January 26, 2008.   HISTORY OF PRESENT ILLNESS:  This 70 year old white male patient  presented to Dr. Sherlean Foot with history of bilateral knee pain, left worse  than right for more than 20 years.  The left knee pain is constant,  severe,  sharp, dull, stabbing, and burning sensation which is getting  worse.  It awakens him at night.  Activity and work worsen his symptoms,  and Johnny has failed conservative treatments.  X-rays show end-stage  arthritic changes of the knee and because of this Johnny is presenting for a  left knee replacement.   HOSPITAL COURSE:  Johnny [name] tolerated his surgical procedure well without  immediate postoperative complications.  Johnny was transferred to 5000.  Postop day 1, Johnny was having some nausea and reflux symptoms and had  complained that his left leg fell off the bed while Johnny was in the PACU  the day before.  That was unable to be verified, but an x-ray was taken  to rule out fracture which was within normal limits.  Johnny was started on  meds for reflux.  Hemovac was discontinued from the leg.  Johnny was started  on therapy per protocol.  During that day while Johnny was getting up with  therapy, Johnny actually had a run of severe tachycardia.  Because  of that,  a cardiology consult was obtained from Dr. Elvis Coil office and Johnny  followed him throughout the hospitalization.   Postop day 2, a chest x-ray the day before showed low lung volumes.  CT  of the chest was negative for PE.  Cardiac enzymes were within normal  limits.  Johnny did convert to normal sinus rhythm after being treated with  some adenosine, and Johnny seemed to be stable from a cardiac standpoint.  His Foley was able to be discontinued, dressing was changed to the knee,  and incision was well-approximated with staples.  Johnny was continued on  therapy and switched to p.o. pain meds.   On postop day #3, Johnny continued to remain stable from a cardiac  standpoint.  T-max was 99.1.  Vitals were stable.  Hemoglobin 11.2,  hematocrit 32.3, white count 11.3.  Leg was neurovascularly intact.  Johnny  has tolerated CPM and had some constipation which was treated with a  laxative.  Cardiology felt Johnny needed to be monitored for another day,  but then on February 24, 2008, Johnny  was ready for discharge home.  Johnny was  afebrile at that time, vitals stable, doing well with therapy, felt Johnny  was ready for discharge home, and discharged home later that day.   DISCHARGE INSTRUCTIONS:  Diet:  Johnny can resume his regular  prehospitalization diet.   MEDICATIONS:  Johnny Arnett resume his home meds as follows.  1. Plavix 75 mg p.o. q.a.m.  2. Mobic is on hold at this time.  3. Monopril 3 mg half a tablet p.o. q.a.m.  4. Protonix 40 mg p.o. q.a.m.  5. Aspirin 81 mg is on hold at this time until his Lovenox is      completed.  6. Lipitor 10 mg p.o. q.a.m.  7. Metoprolol 25 mg p.o. b.i.d.  8. Hydroxyzine 25 mg p.o. q.a.m.  9. Meclizine 25 mg p.o. q.6 h. p.r.n.  10.Johnny is to hold off on the Extra Strength Tylenol at this time.  11.Vitamin B12 shot monthly.  12.Amlodipine 10 mg p.o. q.a.m.   Additional meds at this time include:  1. Lovenox 40 mg subcu at 8 a.m., last dose on February 07, 2008, and Johnny      can resume his aspirin on February 08, 2008.  2. Celebrex 200 mg 1 tablet p.o. b.i.d. for 1 week and then 1 q.a.m.      21 with no refill.  3. Percocet 5/325 1-2 tablets p.o. q.4 h. p.r.n. for pain 60 with no      refill.  4. Robaxin 500 mg 1-2 tablets p.o. q.6 h. p.r.n. for spasms 50 with no      refill.   ACTIVITY:  Johnny can be out of bed, weightbearing as tolerated on the left  leg with use of a walker.  Johnny is to have home CPM 0-90 degrees 6-8 hours  a day and home health PT per [facility].  Please see the  blue total knee discharge sheet for further activity instructions.   WOUND CARE:  Johnny [name] shower after no drainage from the wound for 2 days.  Please see the blue total knee discharge sheet for further wound care  instructions.   FOLLOWUP:  Johnny needs to follow up with Dr. Sherlean Foot in our office on  Tuesday, February 08, 2008, and needs to call [phone] for that  appointment.   LABORATORY DATA:  Chest CT done on January 25, 2008, showed no signs of  pulmonary  embolism and mild-to-moderate bibasilar atelectasis.   ABG done on January 25, 2008, on room air showed a pH of 7.434, pCO2 of  36.1, pO2 of 50.6, bicarb of 23.8, and total CO2 of 24.9.   Hemoglobin/hematocrit ranged from 17 and 49.8 on January 19, 2008, to 11.2  and 32.3 on January 27, 2008.  White count went from 8.3 on January 19, 2008,  to 15.2 on January 26, 2008, to 11.3 on January 27, 2008.  Platelets remained  within normal limits.   Sodium dropped to a low of 132 on January 26, 2008.  Potassium ranged from  4.5 on January 19, 2008, to 3.2 on January 27, 2008, to 3.3 on January 28, 2008.  Glucose ranged from 98 on January 19, 2008, to 144 on January 27, 2008, to  100 on January 28, 2008.   Calcium ranged from 10.1 on January 19, 2008, to a low of 8.1 on January 26, 2008, to 8.1 on January 28, 2008.   CK, CK-MB on January 25, 2008, at 15:15 was 296 and 5 with a troponin of  0.02; at 6:10 it was 327 and 5.4 with a troponin of 0.02; and on January 26, 2008, CK was 382, CK-MB 6, troponin 0.02.  All other laboratory  studies were within normal limits.      Legrand Pitts Duffy, P.A.    ______________________________  Mila Homer. Sherlean Foot, M.D.    KED/MEDQ  D:  03/08/2008  T:  03/09/2008  Job:  [id number]

## 2011-03-14 NOTE — Discharge Summary (Signed)
NAMEMarland Kitchen  EILEEN, CROSWELL NO.:  [id number]   MEDICAL RECORD NO.:  [id number]          PATIENT TYPE:  INP   LOCATION:  5525                         FACILITY:  [facility]   PHYSICIAN:  Peter M. Swaziland, M.D.  DATE OF BIRTH:  [birth year]   DATE OF ADMISSION:  07/14/2004  DATE OF DISCHARGE:  07/16/2004                                 DISCHARGE SUMMARY   Mr. Hafford is seen for recurrent chest pain.  He had undergone stenting of a  large proximal LAD stenosis on July 09, 2004.  He states he felt  excellent for approximately three days but this past weekend began  experiencing sharp parasternal chest pain associated with a pressure.  This  pain was constant and unremitting.  Did not seem to improve significantly  with nitroglycerin.  He returned to the hospital for further evaluation.   For details of past medical history, social history, family history please  see admission history and physical.   LABORATORY DATA:  His ECG was normal.   His chest x-ray showed no acute changes.   His cardiac enzymes were negative x 3.  Chemistry panel was normal.  CBC and  coags were normal.   HOSPITAL COURSE:  The patient was initially admitted and placed on IV  heparin, IV nitroglycerin.  His pain really did not improve significantly  with this therapy but did gradually abate during his hospital stay.  He  ruled out for myocardial infarction.  Given the concern of his recurrent  pain after recent stent, he did undergo repeat cardiac catheterization on  July 15, 2004.  This demonstrated the LAD was widely patent to the  previously stented site.  There was a high take off of the first diagonal  that did show some mild impingement at the ostium but this did not appear to  be severe, and he had excellent flow down the diagonal branch.  This was  essentially unchanged from when we completed his stent procedure.  The  patient's heparin and nitroglycerin were subsequently discontinued.  He  was  started on Protonix for acid reflux disease.  He underwent abdominal  ultrasound the following morning and this was negative.  His pain had  resolved at this point and it was felt that he was stable for discharge.  Based on his findings, it was felt that his pain was non-cardiac.   IMPRESSION:  1.  Chest pain, noncardiac.  2.  Atherosclerotic coronary artery disease, status post recent stenting of      the proximal left anterior descending artery.  3.  Hypertension.  4.  Hypercholesterolemia.   PLAN:  Discharge to home.   MEDICATIONS:  1.  Coated aspirin 81 mg per day.  2.  Plavix 75 mg per day.  3.  Toprol XL 50 mg per day.  4.  Lipitor 10 mg daily.  5.  Monopril 20 mg daily.  6.  Mobic 15 mg daily.  7.  Hydroxizine 25 mg q.h.s.  8.  Protonix 40 mg per day.   We will continue with a low fat diet.  Follow up with Dr. Swaziland in one week.   His discharge status is improved.       PMJ/MEDQ  D:  07/16/2004  T:  07/16/2004  Job:  [id number]   cc:   Geoffry Paradise, M.D.  [address]  [place]  [place] [address]  Fax: [phone]

## 2011-03-14 NOTE — Op Note (Signed)
NAME:  Johnny [name], Johnny [name]                   ACCOUNT NO.:  [id number]   MEDICAL RECORD NO.:  [id number]          PATIENT TYPE:  AMB   LOCATION:  DSC                          FACILITY:  [facility]   PHYSICIAN:  Katy Fitch. Sypher, M.D. DATE OF BIRTH:  [birth year]   DATE OF PROCEDURE:  03/19/2006  DATE OF DISCHARGE:                                 OPERATIVE REPORT   PREOPERATIVE DIAGNOSES:  1.  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  2.  Chronic stenosing tenosynovitis right long finger at A1 pulley.   POSTOPERATIVE DIAGNOSES:  1.  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  2.  Chronic stenosing tenosynovitis right long finger at A1 pulley.   OPERATION:  1.  Release of right transverse carpal ligament.  2.  Release of right long finger A1 pulley.   SURGEON:  Katy Fitch. Sypher, M.D.   ASSISTANT:  No assistant.   ANESTHESIA:  General by LMA.   SUPERVISING ANESTHESIOLOGIST:  Dr. Gypsy Balsam.   INDICATIONS:  Johnny [name] is a 70 year old gentleman referred through the  courtesy of Dr. Jacky Kindle for evaluation and management of right hand pain and  triggering of the right long finger.   Due to a failure to respond to nonoperative measures, he is brought to the  operating room at this time for release of his right transverse carpal  ligament and release of his right long finger A1 pulley.   PROCEDURE:  Johnny [name] is brought to the operating room and placed in supine  position on the operating table.   Following the induction of general anesthesia by LMA technique, the right  arm was prepped with Betadine soap solution, sterilely draped.  Following  exsanguination of the right arm with an Esmarch bandage, the arterial  tourniquet was inflated to 220 mmHg.  The fingers were positioned in a lead  hand followed by incision directly over the palpably thickened A1 pulley of  the right long fingers.  The subcutaneous tissues were carefully divided  taking care to gently retract  the radial and ulnar proper digital nerves and  arteries.   The A1 pulley was isolated, cleared with a Freer, split along its radial  border with a scalpel and scissors and the tendons delivered.  A small A0  pulley was noted and released.   The wound was then repaired with intradermal 3-0 Prolene suture.   Attention then was directed to the proximal palm.  A short incision was  fashioned in line with the ring finger.  The subcutaneous tissues were  carefully divided revealing a large palmar cutaneous branch that was gently  retracted and protected.  The palmar fascia was split longitudinally to  reveal the common sensory branches of the median nerve in the mid palmar  space.  The distal margin of the transverse carpal ligament was isolated and  scissors were used to release the ulnar border of the transverse carpal  ligament into the distal forearm.   Johnny [name] has a very large hand.   Despite our best efforts to retract, I could not visualize the  volar forearm  fascia well through a palmar incision.   Johnny [name] has had by palpation with a Glorious [place]ach a thick accessory transverse  carpal ligament at the level of the proximal wrist flexion crease.  Therefore I made a small transverse incision in the distal wrist flexion  crease and dissected the skin superficially off of the volar forearm fascia  and released the accessory transverse carpal ligament and the volar forearm  fascia through the more proximal incision.   This fully decompressed the contents of the carpal canal and allowed release  of the volar forearm fascia 4 cm above the wrist flexion crease.  There were  no apparent complications.   Bleeding points were electrocauterized with bipolar current followed by  repair of the skin with intradermal 3-0 Prolene suture.   2% lidocaine was infiltrated into all wounds for postoperative analgesia  followed by application of Steri-Strips.  A compressive dressing applied to  the volar  plaster splint maintaining the wrist in 5 degrees of dorsiflexion.   Johnny [name] tolerated the surgery and anesthesia well. He was transferred to the  recovery room with stable vital signs.   For aftercare, he was provided a prescription for Percocet 5 mg 1 p.o. q. 4-  6 hours p.r.n. pain, 20 tablets without refill.   We will see him back for follow-up in our office in 1 week or sooner p.r.n.  problems.      Katy Fitch Sypher, M.D.  Electronically Signed     RVS/MEDQ  D:  03/19/2006  T:  03/20/2006  Job:  [id number]

## 2011-03-14 NOTE — Cardiovascular Report (Signed)
NAME:  Johnny [name], Johnny [name]                             ACCOUNT NO.:  [id number]   MEDICAL RECORD NO.:  [id number]                   PATIENT TYPE:  INP   LOCATION:  2016                                 FACILITY:  [facility]   PHYSICIAN:  Elmore Guise., M.D.           DATE OF BIRTH:  09/05/1941   DATE OF PROCEDURE:  07/09/2004  DATE OF DISCHARGE:                              CARDIAC CATHETERIZATION   PROCEDURE:  Left heart catheterization, LV angiogram, coronary angiogram.   PROCEDURE DESCRIPTION:  The patient was brought to the cardiac  catheterization lab after obtaining appropriate informed consent.  He was  prepped and draped in sterile fashion.  Approximately 20 mL of subcutaneous  1% lidocaine was used for local anesthesia.  6 French sheath was placed in  the right femoral artery without difficulty.  Left heart catheterization,  coronary angiogram and LV angiogram were then performed without difficulty.   FINDINGS:  Left main:  Normal.  LAD:  Large vessel with eccentric hazy proximal 70-80% stenosis, mid and  distal with mild luminal irregularities.  Left circumflex:  Dominant, large vessel with mild luminal irregularities.  RCA:  Nondominant, mild luminal irregularities.   LVEF is 50-55% with no wall motion abnormalities.  LVEDP was 8 mmHg.   IMPRESSION:  1.  Single-vessel obstructive left anterior descending disease.  2.  Preserved left ventricular systolic function with an ejection fraction      of 50-55% and no wall motion abnormalities.   PLAN:  PCI of LAD.                                               Elmore Guise., M.D.    TWK/MEDQ  D:  07/09/2004  T:  07/09/2004  Job:  [id number]

## 2011-03-14 NOTE — H&P (Signed)
NAME:  Johnny [name], Johnny [name]                             ACCOUNT NO.:  [id number]   MEDICAL RECORD NO.:  [id number]                   PATIENT TYPE:  INP   LOCATION:  2016                                 FACILITY:  [facility]   PHYSICIAN:  Peter M. Swaziland, M.D.               DATE OF BIRTH:  1940/10/30   DATE OF ADMISSION:  07/08/2004  DATE OF DISCHARGE:                                HISTORY & PHYSICAL   HISTORY OF PRESENT ILLNESS:  Mr. Swickard is a 70 year old white male admitted  with refractory chest pain.  He presents with a 64-month history of not  feeling well.  He complained of increased fatigue and weakness, and  intermittent bilateral parasternal chest pain.  It was associated with a  pressure and heaviness in his left parasternal region and radiation to his  left arm.  He underwent a stress Cardiolite study on July 04, 2004.  He  walked 6-1/2 minutes on the Bruce protocol and developed his typical chest  pain.  He had runs of both nonsustained ventricular tachycardia and  supraventricular tachycardia with exercise without significant ST segment  changes.  His Cardiolite images demonstrate a fixed inferior wall and  reversible inferoseptal wall defects.  His ejection fraction is 47%.  Over  this past weekend his chest pain has worsened and is now persistent.  Because of these progressive symptoms, he is now admitted for medical  stabilization and further cardiac evaluation.   PAST MEDICAL HISTORY:  1.  Hypertension.  2.  Borderline glucose intolerance.  3.  Previous lumbar laminectomy.  4.  Previous sinus surgery x2.   MEDICATIONS:  1.  Monopril 20 mg per day.  2.  Hydroxyzine 25 mg per day.  3.  Lozol 2.5 mg daily.   ALLERGIES:  He has no known allergies.   SOCIAL HISTORY:  He is married.  He is a Advertising account executive.  He has 3  children.  He quit smoking 25 years ago.  Denies alcohol use.   FAMILY HISTORY:  Father died at age 71 of kidney failure.  His mother died  at age 61 of  a stroke.   REVIEW OF SYSTEMS:  He has had no history of bleeding disorder or ulcers; no  history of TIA or stroke; denies any claudication symptoms; has had no  edema, orthopnea or PND; no recent change in bowel or bladder habits.  Other  review of systems are negative.   PHYSICAL EXAM:  GENERAL:  The patient is a pleasant white male in no  apparent distress.  Weight is 216.  VITAL SIGNS:  Blood pressure is 148/100, pulse is 80 and regular.  HEENT:  Pupils are equal, round and reactive to light and accommodation.  Extraocular movements are full.  Oropharynx is clear.  NECK:  Neck is supple without JVD, adenopathy, thyromegaly or bruits.  LUNGS:  Lungs are clear.  CARDIAC:  Exam  reveals regular rate and rhythm without murmurs, rubs,  gallops or clicks.  ABDOMEN:  Abdomen is soft and nontender.  He has no masses or bruits.  EXTREMITIES:  Extremities are without edema.  Pulses are 2+ and symmetric.  NEUROLOGIC:  Exam is intact.   LABORATORY DATA:  ECG shows normal sinus rhythm, nonspecific T wave  abnormality.   IMPRESSION:  1.  Unstable angina.  2.  Abnormal Cardiolite study.  3.  Hypertension.  4.  Glucose intolerance.   PLAN:  1.  Will admit to telemetry.  2.  Will begin on aspirin, beta blockade, IV heparin and IV nitroglycerin.  3.  We will check cardiac enzymes and routine laboratory data.  4.  We will plan on cardiac catheterization in the morning.                                                Peter M. Swaziland, M.D.    PMJ/MEDQ  D:  07/08/2004  T:  07/08/2004  Job:  [id number]   cc:   Geoffry Paradise, M.D.  [address]  [place]  [place] [address]  Fax: [phone]

## 2011-03-14 NOTE — Discharge Summary (Signed)
NAMEMarland [name]  ANTHONE, PRIEUR NO.:  [id number]   MEDICAL RECORD NO.:  [id number]          PATIENT TYPE:  INP   LOCATION:  5525                         FACILITY:  [facility]   PHYSICIAN:  Peter M. Swaziland, M.D.  DATE OF BIRTH:  Orser 19, 1942   DATE OF ADMISSION:  07/14/2004  DATE OF DISCHARGE:  07/16/2004                                 DISCHARGE SUMMARY   HISTORY OF PRESENT ILLNESS:  Johnny [name] is a 70 year old white male who  presented with symptoms of recurrent chest pain.  He underwent stent  placement in the proximal LAD on 07/09/04.  Prior Cardiolite study had shown  nonsustained ventricular tachycardia and runs of supraventricular  tachycardia with ST depression.  There was a reversible inferior septal wall  defect.  Ejection fraction was 47%.  Subsequent cardiac catheterization  demonstrated a 70-80% proximal LAD stenosis and this was successfully  stented.  The patient felt well until the day prior to readmission when he  recurrent anterior chest pain while he was walking, he took some  nitroglycerin with improvement but continued to having waxing and waning  pain throughout the day.  He presented back to the emergency room for  reevaluation.   For details of his past medical history, social history, family history, and  physical exam, please see admission history and physical.   LABORATORY DATA:  ECG showed no acute change.  Chest x-ray unchanged.  White  count 7900, hemoglobin 15.1, hematocrit 43.1, platelets 279,000.  Coags were  normal.  Sodium 136, potassium 3.6, chloride 107, CO2 22, BUN 13, creatinine  1.1, glucose 97.  All other chemistries were normal.  Serial cardiac enzymes  were negative.   HOSPITAL COURSE:  The patient was admitted and placed on IV nitroglycerin  and heparin.  He ruled out for a myocardial infarction.  He underwent repeat  cardiac catheterization which demonstrated that the stent in the LAD was  widely patent without filling defects.  There  was some ostial narrowing in  the first diagonal of approximately 50% but there was excellent flow and  this was essentially unchanged from his prior study.  There were no other  significant lesions noted and left ventricular function was normal.  At this  point, we discontinued his heparin and nitroglycerin.  He was placed on  Protonix.  Abdominal ultrasound obtained the following morning demonstrated  no gallstones.  He had a simple cyst in the lower pole of the right kidney.  Otherwise negative ultrasound.  The patient's pain had resolved.  He was  discharged home in stable condition on 08/15/04.   DISCHARGE DIAGNOSES:  1.  Noncardiac chest pain, possibly esophageal.  2.  Atherosclerotic coronary artery disease, status post recent stenting of      the left anterior descending with continued patency.  3.  Hypertension.  4.  Hyperlipidemia.  5.  Glucose intolerance.   DISCHARGE MEDICATIONS:  1.  Enteric-coated aspirin 81 mg per day.  2.  Plavix 75 mg daily.  3.  Toprol XL 50 mg per day.  4.  Lipitor 10 mg daily.  5.  Monopril 20 mg per day.  6.  Mobic 15 mg daily.  7.  Hydroxyzine 25 mg q.h.s.  8.  Protonix 40 mg per day.   The patient is to continue a low fat diet and follow up with Dr. Swaziland in 1  week.  If his pain resolves on Protonix, no further evaluation is warranted.  However, if he continues to have chest pain would consider further upper GI  evaluation and possibly CT of the chest.       PMJ/MEDQ  D:  08/15/2004  T:  08/15/2004  Job:  [id number]   cc:   Geoffry Paradise, M.D.  [address]  [place]  [place] [address]  Fax: [phone]

## 2011-03-14 NOTE — Cardiovascular Report (Signed)
NAME:  Johnny [name], Johnny [name]                             ACCOUNT NO.:  [id number]   MEDICAL RECORD NO.:  [id number]                   PATIENT TYPE:  INP   LOCATION:  5501                                 FACILITY:  [facility]   PHYSICIAN:  Peter M. Swaziland, M.D.               DATE OF BIRTH:  Jan 31, 1941   DATE OF PROCEDURE:  07/15/2004  DATE OF DISCHARGE:                              CARDIAC CATHETERIZATION   INDICATION FOR PROCEDURE:  [name] is a 70 year old white male who presented  a week ago with unstable angina.  He underwent cardiac catheterization at  that time and had a moderate to severe proximal LAD stenosis estimated 70-  80%.  He underwent successful stenting with a 3.5 x 13-mm Cypher drug-  eluting stent that was post dilated to 4 atmospheres.  Following this  procedure, the patient had resolution of chest pain and really felt well up  until yesterday when he had recurrent chest pain similar to his presenting  symptoms before.   ACCESS:  Via the left femoral artery using standard Seldinger technique.   PROCEDURE:  Left heart catheterization, coronary and left ventricular  angiography.   EQUIPMENT:  6 French 4 cm right and left Judkins catheter, 6 French pigtail  catheter, 6 French arterial sheath.   CONTRAST:  90 mL of Omnipaque.   HEMODYNAMIC DATA:  Aortic pressure 129/79 with mean of 100.  Left  ventricular pressure was 129 with EDP of 13 mmHg.   ANGIOGRAPHIC DATA:  The left coronary artery arises and distributes  normally.  The left main coronary artery is normal.   The left anterior descending artery is a very large vessel. The stented site  in the proximal vessel is still widely patent with 0% stenosis.  There is 20-  30% narrowing in the mid LAD.  There is a high take off of the diagonal  which is within the stented segment.  There appears to be a 50% narrowing at  the ostium of this vessel, but there is excellent flow down the diagonal.   The septal perforator is also  well spared.   The left circumflex coronary artery is a very large dominant vessel and  appears normal.   The right coronary artery is nondominant and is normal.   LEFT VENTRICULAR ANGIOGRAPHY:  Performed in the RAO view, demonstrates  normal left ventricular size and contractility with normal systolic  function.  Ejection fraction is estimated at 60%.  There are no segmental  wall motion abnormalities.   FINAL INTERPRETATION:  1.  There is nonobstructive atherosclerotic coronary artery disease.  The      stent in the proximal left anterior      descending is still widely patent and there does not appear to be any      significant side branch compromise.  2.  Normal left ventricular function.   PLAN:  Recommend continued medical  therapy.  Will consider further  evaluation for noncardiac chest pain.      PMJ/MEDQ  D:  07/15/2004  T:  07/15/2004  Job:  [id number]   cc:   Geoffry Paradise, M.D.  [address]  [place]  [place] [address]  Fax: [phone]

## 2011-03-14 NOTE — H&P (Signed)
NAMEMarland Kitchen  Johnny [name], Johnny [name] NO.:  [id number]   MEDICAL RECORD NO.:  [id number]          PATIENT TYPE:  INP   LOCATION:  1831                         FACILITY:  [facility]   PHYSICIAN:  Darden Palmer., M.D.DATE OF BIRTH:  May 06, 1941   DATE OF ADMISSION:  07/14/2004  DATE OF DISCHARGE:                                HISTORY & PHYSICAL   REASON FOR CONSULTATION:  Chest pain.   HISTORY:  This 70 year old male was admitted with recurrent chest pain  following stent placement to the LAD on July 09, 2004. He had presented  with a two-month history of malaise, fatigue, and bilateral parasternal  chest pain. It was associated with pressure and heaviness in his left  parasternal area and radiated to his left arm. He had a stress Cardiolite on  September 8th showing runs of nonsustained ventricular tachycardia with SVT  without ST depression. Cardiolite changes showed fixed inferior wall defect  and reversible inferoseptal defect. The EF was 47%. His chest pain was  worsened and persisted, and he was admitted for medical stabilization and  cardiac evaluation on September 12th. He had a 70% to 80% eccentric proximal  stenosis, the left circumflex was dominant with minimal irregularity, the  right coronary artery was nondominant, and the left ventricular function  showed an ejection fraction of 50% to 55%. He underwent stenting by Dr.  Peter Swaziland with a 3.5 x 13 mm Cypher drug-eluting stent. This was post  dilated with a 4 mm balloon. He had a result noted. He was sent home the  next day and felt well. Yesterday, he had the onset of recurrent anterior  chest discomfort when he was walking that was associated with exertion and  he went home and took a nitroglycerin that resulted in some improvement in  his symptoms. He had recurrent of discomfort that waxed and waned through  the evening and again recurred today, and he took additional nitroglycerin.  He stated the pain  felt similar to what he had previously been having and he  was advised to come to the emergency room. The pain had significantly  lessened by the time he got to the emergency room and was described as a 1-  2/10 pain. He did not have any pleuritic nature of the pain. The pain was  not made worse with pressure or changed in body position.   PAST MEDICAL HISTORY:  Remarkable for hypertension, borderline glucose  intolerance, and hyperlipidemia.   PAST SURGICAL HISTORY:  Lumbar laminectomy, previous sinus surgery times  two.   MEDICATIONS:  1.  Monopril 20 mg daily.  2.  Mobic 15 mg daily.  3.  Aspirin 81 mg daily.  4.  Plavix 75 mg daily.  5.  Lipitor 10 mg daily.  6.  Toprol XL 50 mg daily.  7.  Hydroxyzine 25 mg daily.   SOCIAL HISTORY:  He is married, is a Advertising account executive, and has three  children. He quit smoking 25 years ago. He denies alcohol usage.   FAMILY HISTORY:  Father died of age 13 of kidney  failure. Mother died of age  40 of a stroke.   REVIEW OF SYSTEMS:  He is essentially unchanged since the one dictated in  the history last week. He has no history of previous GI disorders at the  present time. He had been active since his catheterization without recurrent  difficulty.   PHYSICAL EXAMINATION:  GENERAL: He is a pleasant male who is in no acute  distress, slightly obese.  VITAL SIGNS: Blood pressure is currently 130/85, pulse 80 and regular.  SKIN: Warm and dry.  HEENT: EOMI. PERRLA. C&S clear. Fundi not examined. Pharynx negative.  NECK: Supple without masses, JVD, thyromegaly, or bruits.  LUNGS: Clear A&P.  CARDIAC: Normal S1 and S2. No S3, S4, or murmur.  ABDOMEN: Soft, nontender. No hepatosplenomegaly or aneurysm.  EXTREMITIES: Without edema. Femoral pulses 2+. Distal pulses 2+.  NEUROLOGIC: Normal.   EKG did not show any acute abnormality.   Laboratory data on admission shows normal CBC. Chemistry panel shows  potassium of 3.6. Initial set of  cardiac enzymes was normal.   IMPRESSION:  1.  Recurrent chest pain consistent with unstable angina following stent      placement last week.  2.  Coronary artery disease with previous left anterior descending disease,      minimal disease in other vessels.  3.  Hypertension.  4.  Hyperlipidemia, under treatment.  5.  History of lumbar laminectomy.   RECOMMENDATIONS:  I reviewed the cardiac catheterization films on him. He  had a good stent result, but he had a diagonal or intermediate branch that  arose proximal to the stenosis that possibly could have been affected during  the intervention. I could not identify the origin of this vessel on the post  catheterization angiograms and this  could essentially be a culprit vessel if he is having unstable angina. Thus,  he will be admitted to the hospital and placed on heparin, continue on his  other medications including Plavix and I will let Dr. Swaziland re-assess him  in the morning. He [place] need to have a repeat catheterization.       WST/MEDQ  D:  07/14/2004  T:  07/14/2004  Job:  [id number]   cc:   Geoffry Paradise, M.D.  [address]  [place]  [place] [address]  Fax: [phone]   Peter M. Swaziland, M.D.  [address]  [place], [place] [address]  Fax: [phone]

## 2011-03-14 NOTE — Discharge Summary (Signed)
NAME:  Johnny [name], Johnny [name]                             ACCOUNT NO.:  [id number]   MEDICAL RECORD NO.:  [id number]                   PATIENT TYPE:  INP   LOCATION:  6532                                 FACILITY:  [facility]   PHYSICIAN:  Peter M. Swaziland, M.D.               DATE OF BIRTH:  1941-04-18   DATE OF ADMISSION:  07/08/2004  DATE OF DISCHARGE:  07/10/2004                                 DISCHARGE SUMMARY   HISTORY OF PRESENT ILLNESS:  Johnny [name] is a 70 year old white male with  history of hypertension, who presented with refractory chest pain.  He had  not felt well over the past 2 months.  He developed intermittent bilateral  parasternal chest pain associated with pressure and heaviness.  He had a  stress Cardiolite on July 04, 2004 which did reproduce his chest pain.  He had some nonsustained arrhythmias.  He had no significant ST segment  changes.  His Cardiolite study suggested a fixed inferior reversible  inferoseptal wall defect.  Over the weekend, his pain had worsened and he  was admitted for medical stabilization and evaluation.  For details of his  past medical history, social history, family history and physical exam,  please see admission history and physical.   LABORATORY DATA:  His ECG showed sinus rhythm with nonspecific T wave  abnormality.   His chest x-ray showed no active disease.   His white count was 7700, his hemoglobin 15.7, hematocrit 44.5, platelets  250,000.  Sodium 139, potassium 3.4, chloride 105, CO2 26, BUN 15,  creatinine 1.1, glucose was 98.  His liver functions were normal.  Calcium  was normal.  BNP level was less than 30.  Hemoglobin A1c was 5.5%.  His  cholesterol panel showed a total cholesterol of 218, triglycerides 342, HDL  of 44 and LDL of 106.   HOSPITAL COURSE:  The patient was admitted to telemetry monitoring.  He was  started on IV nitroglycerin and IV heparin with significant improvement in  his chest pain symptoms.  On July 09, 2004, he underwent cardiac  catheterization.  This demonstrated a left dominant circulation with very  large circumflex vessel.  There was a 70% to 80% eccentric stenosis in the  proximal LAD, otherwise, no significant obstructive disease was noted.  Left  ventricular function appeared normal.  He underwent successful stenting of  the proximal LAD using a 3.5 x 13.0-mm CYPHER drug-eluting stent and post-  dilating up to 4 atmospheres with a 4.0 x 12.0-mm Quantum balloon up to 16  atmospheres.  This yielded an excellent angiographic result with 0% residual  stenosis.  Following the procedure, ECG was normal.  He had no further chest  pain.  He had no groin complications.  His blood pressure was running fairly  low during the hospital stay and because of his low potassium, we  recommended stopping his Lozol.  He  was discharged home on July 10, 2004 in stable condition.   DISCHARGE DIAGNOSES:  1.  Unstable angina pectoris.  2.  Hypertension.  3.  Hypercholesterolemia.   DISCHARGE MEDICATIONS:  1.  Hydroxyzine 25 mg nightly.  2.  Monopril 20 mg per day.  3.  Mobic 15 mg per day.  4.  Coated aspirin 81 mg per day.  5.  Plavix 75 mg per day.  6.  Lipitor 10 mg per day.  7.  Toprol-XL 50 mg per day.   ACTIVITY:  The patient is to avoid heavy lifting or straining for the next  week.   DIET:  Will follow a low-cholesterol diet.   FOLLOWUP:  Will follow up with Dr. Peter M. Swaziland in 1 weeks.   DISCHARGE STATUS:  Improved.                                                Peter M. Swaziland, M.D.    PMJ/MEDQ  D:  07/10/2004  T:  07/10/2004  Job:  [id number]   cc:   Geoffry Paradise, M.D.  [address]  [place]  [place] [address]  Fax: [phone]

## 2011-03-14 NOTE — Cardiovascular Report (Signed)
NAME:  Johnny [name], Johnny [name]                             ACCOUNT NO.:  [id number]   MEDICAL RECORD NO.:  [id number]                   PATIENT TYPE:  INP   LOCATION:  6532                                 FACILITY:  [facility]   PHYSICIAN:  Peter M. Swaziland, M.D.               DATE OF BIRTH:  Nov 29, 1940   DATE OF PROCEDURE:  07/09/2004  DATE OF DISCHARGE:                              CARDIAC CATHETERIZATION   INDICATION FOR PROCEDURE:  The patient is a 70 year old white male who  presents with unstable angina.  He has a history of hypertension,  hyperlipidemia.  Cardiac catheterization demonstrates a left dominant  circulation.  There is a moderate to severe stenosis in the proximal LAD  estimated at 70-80% which is eccentric.  It is a very large vessel.  He had  no other significant obstructive disease and left ventricular function was  normal.  We proceeded with stenting of the proximal LAD.   EQUIPMENT:  A 6 French arterial sheath, 6 [place] FL4 guide, a 0.014 High-  Torque floppy wire, a 3.5 x 13 mm CYPHER drug-eluting stent and a 4.0 x 12  mm Quantum drug-eluting stent.   MEDICATIONS:  The patient was given 300 mg of p.o. Plavix, 2 mg of IV  Versed, double bolus Integrilin followed by continuous infusion.  Heparin  5000 units IV with subsequent HCT of 280.  The patient was maintained on IV  nitroglycerin during procedure.   PROCEDURE NOTE:  The lesion in the LAD was easily crossed with the wire.  It  was primarily stented using a 3.5 x 13 mm CYPHER drug-eluting stent.  This  was deployed at 11 atmospheres and then post dilated to 16 atmospheres with  a stent balloon.  We then exchanged for a 4.0 x 12 mm Quantum balloon to  post dilate and this was dilated up to 16 atmospheres.  This yielded an  excellent angiographic result with 0% residual stenosis at the lesion site.  There was TIMI grade 3 flow.  The patient was asymptomatic.   FINAL INTERPRETATION:  Successful stenting of the proximal left  anterior  descending.                                               Peter M. Swaziland, M.D.    PMJ/MEDQ  D:  07/09/2004  T:  07/09/2004  Job:  [id number]   cc:   Geoffry Paradise, M.D.  [address]  [place]  [place] [address]  Fax: [phone]

## 2011-04-08 ENCOUNTER — Encounter: Payer: Self-pay | Admitting: Cardiology

## 2011-04-10 ENCOUNTER — Encounter: Payer: Self-pay | Admitting: Cardiology

## 2011-04-10 ENCOUNTER — Ambulatory Visit (INDEPENDENT_AMBULATORY_CARE_PROVIDER_SITE_OTHER): Payer: Medicare Other | Admitting: Cardiology

## 2011-04-10 DIAGNOSIS — I471 Supraventricular tachycardia: Secondary | ICD-10-CM

## 2011-04-10 DIAGNOSIS — I10 Essential (primary) hypertension: Secondary | ICD-10-CM

## 2011-04-10 DIAGNOSIS — I2510 Atherosclerotic heart disease of native coronary artery without angina pectoris: Secondary | ICD-10-CM

## 2011-04-10 DIAGNOSIS — E785 Hyperlipidemia, unspecified: Secondary | ICD-10-CM

## 2011-04-10 DIAGNOSIS — I498 Other specified cardiac arrhythmias: Secondary | ICD-10-CM

## 2011-04-10 NOTE — Progress Notes (Signed)
Johnny [name] Date of Birth: 1941-03-27   History of Present Illness: Johnny [name] is seen for followup of his coronary disease today. Since his last evaluation here he suffered a severe tear in his right shoulder and had extensive surgery. His arm is still in a sling. This has limited his activity. He denies any significant chest pain or shortness of breath. He does note intermittent indigestion which was also the symptoms back in December at which time his Cardiolite study was normal.  Current Outpatient Prescriptions on File Prior to Visit  Medication Sig Dispense Refill  . aspirin 81 MG tablet Take 81 mg by mouth daily.        [name] atorvastatin (LIPITOR) 10 MG tablet Take 10 mg by mouth daily.        . clopidogrel (PLAVIX) 75 MG tablet Take 75 mg by mouth daily.        . Cyanocobalamin (VITAMIN B-12 IJ) Inject as directed as directed.        . fosinopril (MONOPRIL) 20 MG tablet Take 20 mg by mouth daily.        . hydrOXYzine (ATARAX) 25 MG tablet Take 25 mg by mouth daily.        . meclizine (ANTIVERT) 25 MG tablet Take 25 mg by mouth 3 (three) times daily as needed.        . meloxicam (MOBIC) 15 MG tablet Take 15 mg by mouth daily.        . metoprolol tartrate (LOPRESSOR) 25 MG tablet Take 25 mg by mouth 2 (two) times daily.        . pantoprazole (PROTONIX) 40 MG tablet Take 40 mg by mouth daily.        . potassium chloride (K-DUR,KLOR-CON) 10 MEQ tablet Take 10 mEq by mouth daily.        [name] DISCONTD: topiramate (TOPAMAX) 25 MG tablet Take 25 mg by mouth 2 (two) times daily.          No Known Allergies  Past Medical History  Diagnosis Date  . Coronary artery disease   . SVT (supraventricular tachycardia)   . Dyslipidemia   . Hypertension   . GERD (gastroesophageal reflux disease)   . Indigestion     Past Surgical History  Procedure Date  . Lumbar laminectomy   . Replacement total knee   . Cardiac catheterization 12/19/08    NORMAL. EF 55%  . Colonoscopy w/ endoscopic [place]   .  Cardiovascular stress test 10/2010  . Shoulder surgery 02/11/2011    History  Smoking status  . Former Smoker  . Quit date: 04/07/1976  Smokeless tobacco  . Not on file    History  Alcohol Use No    Family History  Problem Relation Age of Onset  . Stroke Mother   . Heart attack Father   . Diabetes Father   . Kidney failure Father     Review of Systems: The review of systems is positive for intermittent indigestion.  All other systems were reviewed and are negative.  Physical Exam: BP 136/88  Pulse 72  Ht 6\' 1"  (1.854 m)  Wt 210 lb 12.8 oz (95.618 kg)  BMI 27.81 kg/m2 He is a pleasant white male in no acute distress. HEENT exam is unremarkable. He has no JVD or bruits. Lungs are clear. Cardiac exam reveals a regular rate and rhythm without gallop or murmur. His right arm is in a sling. Abdomen is soft and nontender. He has no edema. Pedal  pulses are good. He is alert and oriented x3. Cranial nerves II through XII are intact. LABORATORY DATA:   Assessment / Plan:

## 2011-04-10 NOTE — Assessment & Plan Note (Signed)
Well controlled on metoprolol. We will continue on his current dose.

## 2011-04-10 NOTE — Patient Instructions (Signed)
Continue your current medications.  I will see you back in 6 months.   

## 2011-04-10 NOTE — Assessment & Plan Note (Signed)
He is having no significant anginal symptoms. I think his indigestion symptoms are GI related. He will continue on Protonix.

## 2011-04-10 NOTE — Assessment & Plan Note (Signed)
Blood pressure was high on his initial assessment today but came down with rest. He will monitor his blood pressure at home and keep a record.

## 2011-07-04 ENCOUNTER — Other Ambulatory Visit: Payer: Self-pay | Admitting: Cardiology

## 2011-07-04 MED ORDER — METOPROLOL TARTRATE 25 MG PO TABS: 25.0000 mg | ORAL_TABLET | Freq: Two times a day (BID) | ORAL | Status: DC

## 2011-07-04 MED ORDER — ATORVASTATIN CALCIUM 10 MG PO TABS: 10.0000 mg | ORAL_TABLET | Freq: Every day | ORAL | Status: DC

## 2011-07-04 MED ORDER — PANTOPRAZOLE SODIUM 40 MG PO TBEC: 40.0000 mg | DELAYED_RELEASE_TABLET | Freq: Every day | ORAL | Status: DC

## 2011-07-04 MED ORDER — POTASSIUM CHLORIDE CRYS ER 10 MEQ PO TBCR: 10.0000 meq | EXTENDED_RELEASE_TABLET | Freq: Every day | ORAL | Status: DC

## 2011-07-04 NOTE — Telephone Encounter (Signed)
Called needing a 90 day refill of her husbands atorvastatin (LIPITOR) 10 MG tablet, pantoprazole (PROTONIX) 40 MG tablet, metoprolol tartrate (LOPRESSOR) 25 MG tablet, potassium chloride (K-DUR,KLOR-CON) 10 MEQ tablet fill with [facility]. Please call back. I have pulled his chart.

## 2011-07-04 NOTE — Telephone Encounter (Signed)
Called requesting refill on lipitor,protonix, metoprolol tartrate,KCL to be sent to [facility]. sent

## 2011-07-21 LAB — TYPE AND SCREEN

## 2011-07-21 LAB — CBC
HCT: 35.6 — ABNORMAL LOW
Hemoglobin: 12.3 — ABNORMAL LOW
MCHC: 34.2
MCHC: 34.6
Platelets: 278
RDW: 12.3
RDW: 12.9

## 2011-07-21 LAB — DIFFERENTIAL
Eosinophils Absolute: 0.2
Eosinophils Relative: 2
Lymphocytes Relative: 30
Lymphs Abs: 2.4
Monocytes Relative: 13 — ABNORMAL HIGH
Neutrophils Relative %: 55

## 2011-07-21 LAB — COMPREHENSIVE METABOLIC PANEL
ALT: 19
AST: 20
Calcium: 10.1
Creatinine, Ser: 1.04
GFR calc Af Amer: >60
Sodium: 137
Total Protein: 7.5

## 2011-07-21 LAB — BASIC METABOLIC PANEL
CO2: 25
GFR calc non Af Amer: >60
Glucose, Bld: 125 — ABNORMAL HIGH
Potassium: 3.8
Sodium: 137

## 2011-07-21 LAB — CARDIAC PANEL(CRET KIN+CKTOT+MB+TROPI)
CK, MB: 5 — ABNORMAL HIGH
Relative Index: 1.7
Relative Index: 1.7
Troponin I: 0.02
Troponin I: 0.02

## 2011-07-21 LAB — URINALYSIS, ROUTINE W REFLEX MICROSCOPIC
Hgb urine dipstick: NEGATIVE
Protein, ur: NEGATIVE
Specific Gravity, Urine: 1.006
Urobilinogen, UA: 0.2

## 2011-07-21 LAB — BLOOD GAS, ARTERIAL
Acid-Base Excess: 0
TCO2: 24.9
pCO2 arterial: 36.1

## 2011-07-21 LAB — PROTIME-INR: INR: 0.9

## 2011-07-22 LAB — CBC
HCT: 32.3 — ABNORMAL LOW
HCT: 33.1 — ABNORMAL LOW
Hemoglobin: 11.2 — ABNORMAL LOW
Hemoglobin: 11.6 — ABNORMAL LOW
MCV: 90.5
MCV: 90.9
Platelets: 167
RBC: 3.66 — ABNORMAL LOW
RDW: 12.2
WBC: 15.2 — ABNORMAL HIGH

## 2011-07-22 LAB — BASIC METABOLIC PANEL
BUN: 10
BUN: 10
BUN: 10
CO2: 23
CO2: 24
Calcium: 8.1 — ABNORMAL LOW
Chloride: 103
Creatinine, Ser: 0.91
Creatinine, Ser: 1
GFR calc non Af Amer: >60
GFR calc non Af Amer: >60
Glucose, Bld: 100 — ABNORMAL HIGH
Glucose, Bld: 127 — ABNORMAL HIGH
Glucose, Bld: 144 — ABNORMAL HIGH
Potassium: 3.3 — ABNORMAL LOW

## 2011-07-22 LAB — B-NATRIURETIC PEPTIDE (CONVERTED LAB): Pro B Natriuretic peptide (BNP): 123 — ABNORMAL HIGH

## 2011-07-25 ENCOUNTER — Telehealth: Payer: Self-pay | Admitting: Cardiology

## 2011-07-25 NOTE — Telephone Encounter (Signed)
Refill of generic plavix, uses [facility] 90 days supply

## 2011-07-28 MED ORDER — CLOPIDOGREL BISULFATE 75 MG PO TABS: 75.0000 mg | ORAL_TABLET | Freq: Every day | ORAL | Status: DC

## 2011-08-05 LAB — BASIC METABOLIC PANEL
BUN: 17
Chloride: 106
Creatinine, Ser: 1.04
Glucose, Bld: 144 — ABNORMAL HIGH
Potassium: 4.1

## 2011-08-05 LAB — POCT HEMOGLOBIN-HEMACUE: Operator id: 128471

## 2011-08-07 LAB — COMPREHENSIVE METABOLIC PANEL WITH GFR
AST: 22
Albumin: 4.1
Calcium: 9.4
Chloride: 103
Creatinine, Ser: 1.08
GFR calc Af Amer: >60

## 2011-08-07 LAB — DIFFERENTIAL
Basophils Absolute: 0
Basophils Relative: 0
Eosinophils Absolute: 0
Eosinophils Relative: 0
Lymphocytes Relative: 10 — ABNORMAL LOW
Lymphs Abs: 1
Monocytes Absolute: 0.6
Monocytes Relative: 6
Neutro Abs: 8.1 — ABNORMAL HIGH
Neutrophils Relative %: 83 — ABNORMAL HIGH

## 2011-08-07 LAB — COMPREHENSIVE METABOLIC PANEL
ALT: 17
Alkaline Phosphatase: 61
BUN: 16
CO2: 25
GFR calc non Af Amer: >60
Glucose, Bld: 105 — ABNORMAL HIGH
Potassium: 3.8
Sodium: 136
Total Bilirubin: 1
Total Protein: 7.3

## 2011-08-07 LAB — I-STAT 8, (EC8 V) (CONVERTED LAB)
BUN: 17
Bicarbonate: 24.4 — ABNORMAL HIGH
Glucose, Bld: 106 — ABNORMAL HIGH
HCT: 51
Hemoglobin: 17.3 — ABNORMAL HIGH
Operator id: 284141
Potassium: 3.8
Sodium: 138

## 2011-08-07 LAB — URINALYSIS, ROUTINE W REFLEX MICROSCOPIC
Bilirubin Urine: NEGATIVE
Glucose, UA: NEGATIVE
Hgb urine dipstick: NEGATIVE
Ketones, ur: NEGATIVE
Nitrite: NEGATIVE
Protein, ur: NEGATIVE
Specific Gravity, Urine: 1.009
Urobilinogen, UA: 0.2
pH: 7

## 2011-08-07 LAB — CBC
HCT: 48.3
Hemoglobin: 16.3
MCHC: 33.8
MCV: 91.4
Platelets: 257
RBC: 5.29
RDW: 12.3
WBC: 9.8

## 2011-08-07 LAB — POCT CARDIAC MARKERS
CKMB, poc: 2.5
Myoglobin, poc: 80.4
Operator id: 284141
Troponin i, poc: <0.05
Troponin i, poc: <0.05

## 2011-08-07 LAB — POCT I-STAT CREATININE: Creatinine, Ser: 1

## 2011-10-01 ENCOUNTER — Ambulatory Visit (INDEPENDENT_AMBULATORY_CARE_PROVIDER_SITE_OTHER): Payer: Medicare Other | Admitting: Cardiology

## 2011-10-01 ENCOUNTER — Encounter: Payer: Self-pay | Admitting: Cardiology

## 2011-10-01 VITALS — BP 122/72 | HR 62 | Ht 72.0 in | Wt 214.8 lb

## 2011-10-01 DIAGNOSIS — I498 Other specified cardiac arrhythmias: Secondary | ICD-10-CM

## 2011-10-01 DIAGNOSIS — I471 Supraventricular tachycardia: Secondary | ICD-10-CM

## 2011-10-01 DIAGNOSIS — I2510 Atherosclerotic heart disease of native coronary artery without angina pectoris: Secondary | ICD-10-CM

## 2011-10-01 DIAGNOSIS — I10 Essential (primary) hypertension: Secondary | ICD-10-CM

## 2011-10-01 MED ORDER — LISINOPRIL-HYDROCHLOROTHIAZIDE 20-12.5 MG PO TABS: 1.0000 | ORAL_TABLET | Freq: Two times a day (BID) | ORAL | Status: DC

## 2011-10-01 NOTE — Patient Instructions (Signed)
Continue your current medications  I will see you again in 6 months.   

## 2011-10-01 NOTE — Assessment & Plan Note (Signed)
No recurrence on metoprolol.

## 2011-10-01 NOTE — Progress Notes (Signed)
Johnny [name] Date of Birth: 07-31-1941   History of Present Illness: Johnny [name] is seen for followup of his coronary disease today. Since his last visit he underwent a shoulder reconstruction. He has recovered well from that. His activity is still limited. He reports his blood pressure was elevated and his ACE inhibitor was changed to lisinopril HCT twice a day. Since then his blood pressure has improved. He denies any chest pain. He's had no episodes of tachycardia.  Current Outpatient Prescriptions on File Prior to Visit  Medication Sig Dispense Refill  . aspirin 81 MG tablet Take 81 mg by mouth daily.        [name] atorvastatin (LIPITOR) 10 MG tablet Take 1 tablet (10 mg total) by mouth daily.  90 tablet  3  . clopidogrel (PLAVIX) 75 MG tablet Take 1 tablet (75 mg total) by mouth daily.  90 tablet  3  . Cyanocobalamin (VITAMIN B-12 IJ) Inject as directed as directed.        . hydrOXYzine (ATARAX) 25 MG tablet Take 25 mg by mouth daily.        . meclizine (ANTIVERT) 25 MG tablet Take 25 mg by mouth 3 (three) times daily as needed.        . meloxicam (MOBIC) 15 MG tablet Take 15 mg by mouth daily.        . metoprolol tartrate (LOPRESSOR) 25 MG tablet Take 1 tablet (25 mg total) by mouth 2 (two) times daily.  180 tablet  3  . pantoprazole (PROTONIX) 40 MG tablet Take 1 tablet (40 mg total) by mouth daily.  90 tablet  3  . potassium chloride (K-DUR,KLOR-CON) 10 MEQ tablet Take 1 tablet (10 mEq total) by mouth daily.  90 tablet  3  . lisinopril-hydrochlorothiazide (ZESTORETIC) 20-12.5 MG per tablet Take 1 tablet by mouth 2 (two) times daily.        No Known Allergies  Past Medical History  Diagnosis Date  . Coronary artery disease   . SVT (supraventricular tachycardia)   . Dyslipidemia   . Hypertension   . GERD (gastroesophageal reflux disease)   . Indigestion   . Glucose intolerance (pre-diabetes)     Borderline glucose intolerance  . Unstable angina pectoris   . Hypercholesterolemia   .  Acromioclavicular joint arthritis   . Rotator cuff tear     Past Surgical History  Procedure Date  . Lumbar laminectomy   . Replacement total knee   . Cardiac catheterization 12/19/08    NORMAL. EF 55%  . Colonoscopy w/ endoscopic [place]   . Cardiovascular stress test 10/2010  . Shoulder surgery 02/11/2011  . Nasal sinus surgery     x2  . Resection tumor clavicle radical     Open distal clavicle resection  . Reconstructive shoulder surgery     right    History  Smoking status  . Former Smoker  . Quit date: 04/07/1976  Smokeless tobacco  . Not on file    History  Alcohol Use No    Family History  Problem Relation Age of Onset  . Stroke Mother   . Heart attack Father   . Diabetes Father   . Kidney failure Father     Review of Systems: As noted in history of present illness.  All other systems were reviewed and are negative.  Physical Exam: BP 122/72  Pulse 62  Ht 6' (1.829 m)  Wt 97.433 kg (214 lb 12.8 oz)  BMI 29.13 kg/m2 He is  a pleasant white male in no acute distress. HEENT exam is unremarkable. He has no JVD or bruits. Lungs are clear. Cardiac exam reveals a regular rate and rhythm without gallop or murmur. His right arm is in a sling. Abdomen is soft and nontender. He has no edema. Pedal pulses are good. He is alert and oriented x3. Cranial nerves II through XII are intact. LABORATORY DATA: ECG today demonstrates normal sinus rhythm with leftward axis. It is otherwise normal.  Assessment / Plan:

## 2011-10-01 NOTE — Assessment & Plan Note (Signed)
Blood pressure is now well controlled on current antihypertensives. We will continue the same.

## 2011-10-15 ENCOUNTER — Encounter (HOSPITAL_COMMUNITY): Payer: Self-pay | Admitting: General Practice

## 2011-10-15 ENCOUNTER — Inpatient Hospital Stay (HOSPITAL_COMMUNITY): Payer: Medicare Other

## 2011-10-15 ENCOUNTER — Encounter: Payer: Self-pay | Admitting: Cardiology

## 2011-10-15 ENCOUNTER — Ambulatory Visit (HOSPITAL_COMMUNITY)
Admission: EM | Admit: 2011-10-15 | Discharge: 2011-10-16 | Disposition: A | Discharge status: Home / Self Care | Payer: Medicare Other | Source: Ambulatory Visit | Attending: Cardiovascular Disease | Admitting: Cardiovascular Disease

## 2011-10-15 ENCOUNTER — Ambulatory Visit (INDEPENDENT_AMBULATORY_CARE_PROVIDER_SITE_OTHER): Payer: Medicare Other | Admitting: Cardiology

## 2011-10-15 ENCOUNTER — Encounter (HOSPITAL_COMMUNITY)
Admission: EM | Disposition: A | Discharge status: Home / Self Care | Payer: Self-pay | Source: Ambulatory Visit | Attending: Cardiovascular Disease

## 2011-10-15 DIAGNOSIS — E785 Hyperlipidemia, unspecified: Secondary | ICD-10-CM | POA: Insufficient documentation

## 2011-10-15 DIAGNOSIS — I10 Essential (primary) hypertension: Secondary | ICD-10-CM | POA: Insufficient documentation

## 2011-10-15 DIAGNOSIS — I2510 Atherosclerotic heart disease of native coronary artery without angina pectoris: Secondary | ICD-10-CM

## 2011-10-15 DIAGNOSIS — Z9861 Coronary angioplasty status: Secondary | ICD-10-CM | POA: Insufficient documentation

## 2011-10-15 DIAGNOSIS — K219 Gastro-esophageal reflux disease without esophagitis: Secondary | ICD-10-CM | POA: Insufficient documentation

## 2011-10-15 DIAGNOSIS — R0602 Shortness of breath: Secondary | ICD-10-CM

## 2011-10-15 DIAGNOSIS — I471 Supraventricular tachycardia, unspecified: Secondary | ICD-10-CM | POA: Insufficient documentation

## 2011-10-15 DIAGNOSIS — R079 Chest pain, unspecified: Secondary | ICD-10-CM

## 2011-10-15 DIAGNOSIS — I200 Unstable angina: Secondary | ICD-10-CM | POA: Insufficient documentation

## 2011-10-15 DIAGNOSIS — E538 Deficiency of other specified B group vitamins: Secondary | ICD-10-CM

## 2011-10-15 DIAGNOSIS — I498 Other specified cardiac arrhythmias: Secondary | ICD-10-CM

## 2011-10-15 HISTORY — PX: CARDIAC CATHETERIZATION: SHX172

## 2011-10-15 HISTORY — DX: Postconcussional syndrome: F07.81

## 2011-10-15 HISTORY — PX: LEFT HEART CATHETERIZATION WITH CORONARY ANGIOGRAM: SHX5451

## 2011-10-15 HISTORY — DX: Deficiency of other specified B group vitamins: E53.8

## 2011-10-15 HISTORY — DX: Hypothyroidism, unspecified: E03.9

## 2011-10-15 HISTORY — DX: Anemia, unspecified: D64.9

## 2011-10-15 HISTORY — DX: Shortness of breath: R06.02

## 2011-10-15 LAB — POCT I-STAT, CHEM 8
BUN: 18 mg/dL (ref 6–23)
Calcium, Ion: 1.13 mmol/L (ref 1.12–1.32)
Chloride: 109 mEq/L (ref 96–112)
Glucose, Bld: 106 mg/dL — ABNORMAL HIGH (ref 70–99)
TCO2: 21 mmol/L (ref 0–100)

## 2011-10-15 LAB — COMPREHENSIVE METABOLIC PANEL
ALT: 12 U/L (ref 0–53)
AST: 16 U/L (ref 0–37)
Alkaline Phosphatase: 66 U/L (ref 39–117)
CO2: 19 mEq/L (ref 19–32)
Chloride: 104 mEq/L (ref 96–112)
GFR calc Af Amer: 72 mL/min — ABNORMAL LOW (ref >90)
GFR calc non Af Amer: 62 mL/min — ABNORMAL LOW (ref >90)
Glucose, Bld: 103 mg/dL — ABNORMAL HIGH (ref 70–99)
Potassium: 3.5 mEq/L (ref 3.5–5.1)
Sodium: 138 mEq/L (ref 135–145)
Total Bilirubin: 0.5 mg/dL (ref 0.3–1.2)

## 2011-10-15 LAB — LIPID PANEL
Cholesterol: 136 mg/dL (ref 0–200)
Total CHOL/HDL Ratio: 3.2 RATIO
VLDL: 47 mg/dL — ABNORMAL HIGH (ref 0–40)

## 2011-10-15 LAB — CBC
Hemoglobin: 15.8 g/dL (ref 13.0–17.0)
Platelets: 210 10*3/uL (ref 150–400)
RBC: 5.04 MIL/uL (ref 4.22–5.81)
WBC: 8.6 10*3/uL (ref 4.0–10.5)

## 2011-10-15 LAB — PROTIME-INR: Prothrombin Time: 13 seconds (ref 11.6–15.2)

## 2011-10-15 SURGERY — LEFT HEART CATHETERIZATION WITH CORONARY ANGIOGRAM
Anesthesia: LOCAL

## 2011-10-15 MED ORDER — MELOXICAM 15 MG PO TABS
15.0000 mg | ORAL_TABLET | Freq: Every day | ORAL | Status: DC
  Administered 2011-10-16: 15 mg via ORAL
  Filled 2011-10-15: qty 1

## 2011-10-15 MED ORDER — MIDAZOLAM HCL 2 MG/2ML IJ SOLN
INTRAMUSCULAR | Status: AC
  Filled 2011-10-15: qty 2

## 2011-10-15 MED ORDER — NITROGLYCERIN 0.4 MG SL SUBL: 0.4000 mg | SUBLINGUAL_TABLET | SUBLINGUAL | Status: DC | PRN

## 2011-10-15 MED ORDER — HYDROXYZINE HCL 25 MG PO TABS
25.0000 mg | ORAL_TABLET | Freq: Every day | ORAL | Status: DC
  Administered 2011-10-16: 25 mg via ORAL
  Filled 2011-10-15: qty 1

## 2011-10-15 MED ORDER — ASPIRIN EC 81 MG PO TBEC
81.0000 mg | DELAYED_RELEASE_TABLET | Freq: Every day | ORAL | Status: DC
  Administered 2011-10-16: 81 mg via ORAL
  Filled 2011-10-15: qty 1

## 2011-10-15 MED ORDER — LISINOPRIL 20 MG PO TABS
20.0000 mg | ORAL_TABLET | Freq: Two times a day (BID) | ORAL | Status: DC
  Administered 2011-10-16: 20 mg via ORAL
  Filled 2011-10-15 (×2): qty 1

## 2011-10-15 MED ORDER — SODIUM CHLORIDE 0.9 % IV SOLN: INTRAVENOUS | Status: AC

## 2011-10-15 MED ORDER — NITROGLYCERIN 0.2 MG/ML ON CALL CATH LAB
INTRAVENOUS | Status: AC
  Filled 2011-10-15: qty 1

## 2011-10-15 MED ORDER — METOPROLOL TARTRATE 25 MG PO TABS
25.0000 mg | ORAL_TABLET | Freq: Two times a day (BID) | ORAL | Status: DC
  Administered 2011-10-15 – 2011-10-16 (×2): 25 mg via ORAL
  Filled 2011-10-15 (×2): qty 1

## 2011-10-15 MED ORDER — LISINOPRIL-HYDROCHLOROTHIAZIDE 20-12.5 MG PO TABS: 1.0000 | ORAL_TABLET | Freq: Two times a day (BID) | ORAL | Status: DC

## 2011-10-15 MED ORDER — NITROGLYCERIN 0.4 MG SL SUBL
0.4000 mg | SUBLINGUAL_TABLET | SUBLINGUAL | Status: AC | PRN
Start: 2011-10-15 — End: ?

## 2011-10-15 MED ORDER — DIAZEPAM 5 MG PO TABS: 5.0000 mg | ORAL_TABLET | ORAL | Status: DC | PRN

## 2011-10-15 MED ORDER — ONDANSETRON HCL 4 MG/2ML IJ SOLN
4.0000 mg | Freq: Four times a day (QID) | INTRAMUSCULAR | Status: DC | PRN
Start: 2011-10-15 — End: 2011-10-16

## 2011-10-15 MED ORDER — PANTOPRAZOLE SODIUM 40 MG PO TBEC: 40.0000 mg | DELAYED_RELEASE_TABLET | Freq: Every day | ORAL | Status: DC

## 2011-10-15 MED ORDER — ACETAMINOPHEN 325 MG PO TABS
650.0000 mg | ORAL_TABLET | ORAL | Status: DC | PRN
  Administered 2011-10-15: 650 mg via ORAL
  Filled 2011-10-15: qty 2

## 2011-10-15 MED ORDER — CLOPIDOGREL BISULFATE 75 MG PO TABS
75.0000 mg | ORAL_TABLET | Freq: Every day | ORAL | Status: DC
  Administered 2011-10-16: 75 mg via ORAL
  Filled 2011-10-15: qty 1

## 2011-10-15 MED ORDER — FENTANYL CITRATE 0.05 MG/ML IJ SOLN
INTRAMUSCULAR | Status: AC
  Filled 2011-10-15: qty 2

## 2011-10-15 MED ORDER — OXYCODONE-ACETAMINOPHEN 5-325 MG PO TABS: 1.0000 | ORAL_TABLET | ORAL | Status: DC | PRN

## 2011-10-15 MED ORDER — LIDOCAINE HCL (PF) 1 % IJ SOLN
INTRAMUSCULAR | Status: AC
  Filled 2011-10-15: qty 30

## 2011-10-15 MED ORDER — ROSUVASTATIN CALCIUM 5 MG PO TABS
5.0000 mg | ORAL_TABLET | Freq: Every day | ORAL | Status: DC
  Filled 2011-10-15: qty 1

## 2011-10-15 MED ORDER — HEPARIN (PORCINE) IN NACL 2-0.9 UNIT/ML-% IJ SOLN
INTRAMUSCULAR | Status: AC
  Filled 2011-10-15: qty 2000

## 2011-10-15 MED ORDER — MECLIZINE HCL 25 MG PO TABS
25.0000 mg | ORAL_TABLET | Freq: Three times a day (TID) | ORAL | Status: DC | PRN
  Filled 2011-10-15: qty 1

## 2011-10-15 MED ORDER — HYDROCHLOROTHIAZIDE 12.5 MG PO CAPS
12.5000 mg | ORAL_CAPSULE | Freq: Two times a day (BID) | ORAL | Status: DC
  Administered 2011-10-16: 12.5 mg via ORAL
  Filled 2011-10-15 (×2): qty 1

## 2011-10-15 MED ORDER — ASPIRIN 81 MG PO TABS: 81.0000 mg | ORAL_TABLET | Freq: Every day | ORAL | Status: DC

## 2011-10-15 MED ORDER — POTASSIUM CHLORIDE CRYS ER 10 MEQ PO TBCR
10.0000 meq | EXTENDED_RELEASE_TABLET | Freq: Every day | ORAL | Status: DC
  Administered 2011-10-16: 10 meq via ORAL
  Filled 2011-10-15: qty 1

## 2011-10-15 NOTE — Procedures (Signed)
Cardiac Catheterization Procedure Note  Name: Johnny [name] MRN: [id number] DOB: [birth year]  Procedure: Left Heart Cath, Selective Coronary Angiography, LV angiography  Indication: 70 year old gentleman with known coronary artery disease who has undergone stenting of the proximal LAD several years ago. He presented to the office this afternoon with typical symptoms of unstable angina and was brought directly for urgent cardiac catheterization by EMS.   Procedural details: The right groin was prepped, draped, and anesthetized with 1% lidocaine. Using modified Seldinger technique, a 5 French sheath was introduced into the right femoral artery. Standard Judkins catheters were used for coronary angiography and left ventriculography. LAO aortic root angiography was performed. Catheter exchanges were performed over a guidewire. There were no immediate procedural complications. The patient was transferred to the post catheterization recovery area for further monitoring.  Procedural Findings: Hemodynamics:  AO 116/60 with a mean of 82 LV 115/2   Coronary angiography: Coronary dominance: right  Left mainstem: Dilated, widely patent, no obstructive disease.  Left anterior descending (LAD): There is a widely patent stent in the proximal LAD. There is diffuse ectasia in the mid LAD with slow flow. The distal LAD has diffuse nonobstructive disease with a focal 50-60% stenosis at a hinge point within a band of the vessel.  Left circumflex (LCx): The left circumflex is widely patent. There is also diffuse ectasia with some streaming of flow in the mid vessel. The left circumflex is dominant and it supplies 2 distal obtuse marginal branches and the left PDA branch. There is no high grade obstructive disease present.  Right coronary artery (RCA): Small, nondominant vessel with a dilated ostium.  Left ventriculography: Left ventricular systolic function is normal, LVEF is estimated at 55-65%, there is no  significant mitral regurgitation   Aortic root angiogram: No significant aortic insufficiency, no evi aortic dissection or aneurysm  Final Conclusions:   1. Patent proximal LAD stent 2. Diffuse coronary ectasia without significant obstructive disease 3. Normal left ventricular function 4. No significant abnormality noted in the ascending aorta  Recommendations: Admit for 23 hour observation, cycle cardiac enzymes, baseline lab work and portable chest x-ray. The patient does not appear to have an acute coronary syndrome.  Tonny Bollman 10/15/2011, 6:19 PM

## 2011-10-15 NOTE — H&P (Signed)
PCP: Dr. Jacky Kindle   70 yo with history of CAD and SVT was seen as an add-on today for chest pain. He usually sees Dr. [place]. Since his stent was placed in 2005, he has had periodic episodes of somewhat atypical chest pain. Yesterday, however, he had a more severe episode. He walked to his shop in his backyard and after getting there, developed rather severe substernal chest aching that radiated to his left arm. The severe pain lasted for several hours. He was short of breath. At some point (he does not remember the chronology), he felt his heart start to race. He went back to his house and sat down in his recliner. He took his blood pressure and found that his systolic BP was in the 70s and HR was 144. He thought something was wrong with his cuff so changed the batteries and took his BP again. The BP was still in the 70s. He felt lightheaded. Over time, the pain lessened but never completely resolved. The lightheadedness resolved. He went to bed and got up this morning, still with mild left-sided chest pain. He had an appointment already with Dr. Jacky Kindle so went there. It sounds like he told them about the chest pain yesterday but not the ongoing pain. They had him come by our office. Here, he told me that he was still having left-sided chest pain. It had never totally resolved (was just better than yesterday). Vitals were stable. I gave him a nitroglycerin and the pain improved quickly but did not resolve completely. He has had an ASA today.   ECG: NSR, IVCD (QRS 118 msec), nonspecific inferior and anterolateral TWIs   PMH: 1. CAD: s/p PCI to prox LAD in 9/05. Last cath 2/10 with EF 55%, patent LAD stent, nonobstructive disease.  2. H/o SVT  3. HTN  4. Shoulder surgery 5/12  5. GERD  6. Hyperlipidemia   SH: Married, lives in [place], nonsmoker, no ETOH.   FH: No premature CAD   ROS: All systems reviewed and negative except as per HPI.   Current Outpatient Prescriptions   Medication  Sig   Dispense  Refill   .  aspirin 81 MG tablet  Take 81 mg by mouth daily.     [name]  atorvastatin (LIPITOR) 10 MG tablet  Take 1 tablet (10 mg total) by mouth daily.  90 tablet  3   .  clopidogrel (PLAVIX) 75 MG tablet  Take 1 tablet (75 mg total) by mouth daily.  90 tablet  3   .  Cyanocobalamin (VITAMIN B-12 IJ)  Inject as directed as directed.     .  hydrOXYzine (ATARAX) 25 MG tablet  Take 25 mg by mouth daily.     [name]  lisinopril-hydrochlorothiazide (ZESTORETIC) 20-12.5 MG per tablet  Take 1 tablet by mouth 2 (two) times daily.     .  meclizine (ANTIVERT) 25 MG tablet  Take 25 mg by mouth 3 (three) times daily as needed.     .  meloxicam (MOBIC) 15 MG tablet  Take 15 mg by mouth daily.     .  metoprolol tartrate (LOPRESSOR) 25 MG tablet  Take 1 tablet (25 mg total) by mouth 2 (two) times daily.  180 tablet  3   .  pantoprazole (PROTONIX) 40 MG tablet  Take 1 tablet (40 mg total) by mouth daily.  90 tablet  3   .  potassium chloride (K-DUR,KLOR-CON) 10 MEQ tablet  Take 1 tablet (10 mEq total) by mouth  daily.  90 tablet  3    BP 146/90  Pulse 78  Ht 6\' 1"  (1.854 m)  Wt 95.763 kg (211 lb 1.9 oz)  BMI 27.85 kg/m2  SpO2 97%  General: NAD  Neck: No JVD, no thyromegaly or thyroid nodule.  Lungs: Clear to auscultation bilaterally with normal respiratory effort.  CV: Nondisplaced PMI. Heart regular S1/S2, no S3/S4, no murmur. No peripheral edema. No carotid bruit. Normal pedal pulses.  Abdomen: Soft, nontender, no hepatosplenomegaly, no distention.  Skin: Intact without lesions or rashes.  Neurologic: Alert and oriented x 3.  Psych: Normal affect.  Extremities: No clubbing or cyanosis.  HEENT: Normal.   CAD - Marca Ancona, MD 10/15/2011 5:23 PM Signed  Patient had DES to proximal LAD in 2005. He has not had a significant coronary event since that time. Yesterday, he developed severe substernal chest pain radiating down his left arm along with dyspnea. He still has some residual chest pain today.  The pain improved very soon after getting 1 sublingual NTG but is still present. ECG shows nonspecific changes and an IVCD. I am worried that this represents unstable angina. I am going to send him over to the hospital through the ER. I spoke with the cath lab and he will potentially be cathed this evening.   SVT (supraventricular tachycardia) - Marca Ancona, MD 10/15/2011 5:34 PM Addendum  He Minasyan have had an episode of SVT yesterday when blood pressure was low and HR in the 140s. He did not feel his heart racing, however, when the chest pain began. He was in NSR in the office today with ongoing chest pain. It is possible that SVT caused his symptoms, but given the prolonged nature, I am worried coronary ischemia and will plan cath as above. He will continue metoprolol. He will be on telemetry.

## 2011-10-15 NOTE — Assessment & Plan Note (Signed)
Patient had DES to proximal LAD in 2005.  He has not had a significant coronary event since that time.  Yesterday, he developed severe substernal chest pain radiating down his left arm along with dyspnea.  He still has some residual chest pain today.  The pain improved very soon after getting 1 sublingual NTG but is still present.  ECG shows nonspecific changes and an IVCD.  I am worried that this represents unstable angina.  I am going to send him over to the hospital through the ER.  I spoke with the cath lab and he will potentially be cathed this evening.

## 2011-10-15 NOTE — Progress Notes (Signed)
PCP: Dr. Jacky Kindle  70 yo with history of CAD and SVT was seen as an add-on today for chest pain.  He usually sees Dr. [place].  Since his stent was placed in 2005, he has had periodic episodes of somewhat atypical chest pain.  Yesterday, however, he had a more severe episode.  He walked to his shop in his backyard and after getting there, developed rather severe substernal chest aching that radiated to his left arm.  The severe pain lasted for several hours.  He was short of breath.  At some point (he does not remember the chronology), he felt his heart start to race.  He went back to his house and sat down in his recliner.  He took his blood pressure and found that his systolic BP was in the 70s and HR was 144.  He thought something was wrong with his cuff so changed the batteries and took his BP again.  The BP was still in the 70s.  He felt lightheaded.  Over time, the pain lessened but never completely resolved. The lightheadedness resolved.  He went to bed and got up this morning, still with mild left-sided chest pain.  He had an appointment already with Dr. Jacky Kindle so went there.  It sounds like he told them about the chest pain yesterday but not the ongoing pain.  They had him come by our office.  Here, he told me that he was still having left-sided chest pain.  It had never totally resolved (was just better than yesterday).  Vitals were stable.  I gave him a nitroglycerin and the pain improved quickly but did not resolve completely.  He has had an ASA today.   ECG: NSR, IVCD (QRS 118 msec), nonspecific inferior and anterolateral TWIs  PMH: 1. CAD: s/p PCI to prox LAD in 9/05.  Last cath 2/10 with EF 55%, patent LAD stent, nonobstructive disease.  2. H/o SVT 3. HTN 4. Shoulder surgery 5/12 5. GERD 6. Hyperlipidemia  SH: Married, lives in [place], nonsmoker, no ETOH.   FH: No premature CAD  ROS: All systems reviewed and negative except as per HPI.   Current Outpatient Prescriptions    Medication Sig Dispense Refill  . aspirin 81 MG tablet Take 81 mg by mouth daily.        [name] atorvastatin (LIPITOR) 10 MG tablet Take 1 tablet (10 mg total) by mouth daily.  90 tablet  3  . clopidogrel (PLAVIX) 75 MG tablet Take 1 tablet (75 mg total) by mouth daily.  90 tablet  3  . Cyanocobalamin (VITAMIN B-12 IJ) Inject as directed as directed.        . hydrOXYzine (ATARAX) 25 MG tablet Take 25 mg by mouth daily.        [name] lisinopril-hydrochlorothiazide (ZESTORETIC) 20-12.5 MG per tablet Take 1 tablet by mouth 2 (two) times daily.      . meclizine (ANTIVERT) 25 MG tablet Take 25 mg by mouth 3 (three) times daily as needed.        . meloxicam (MOBIC) 15 MG tablet Take 15 mg by mouth daily.        . metoprolol tartrate (LOPRESSOR) 25 MG tablet Take 1 tablet (25 mg total) by mouth 2 (two) times daily.  180 tablet  3  . pantoprazole (PROTONIX) 40 MG tablet Take 1 tablet (40 mg total) by mouth daily.  90 tablet  3  . potassium chloride (K-DUR,KLOR-CON) 10 MEQ tablet Take 1 tablet (10 mEq total) by mouth  daily.  90 tablet  3   BP 146/90  Pulse 78  Ht 6\' 1"  (1.854 m)  Wt 95.763 kg (211 lb 1.9 oz)  BMI 27.85 kg/m2  SpO2 97% General: NAD Neck: No JVD, no thyromegaly or thyroid nodule.  Lungs: Clear to auscultation bilaterally with normal respiratory effort. CV: Nondisplaced PMI.  Heart regular S1/S2, no S3/S4, no murmur.  No peripheral edema.  No carotid bruit.  Normal pedal pulses.  Abdomen: Soft, nontender, no hepatosplenomegaly, no distention.  Skin: Intact without lesions or rashes.  Neurologic: Alert and oriented x 3.  Psych: Normal affect. Extremities: No clubbing or cyanosis.  HEENT: Normal.

## 2011-10-15 NOTE — Interval H&P Note (Signed)
History and Physical Interval Note:  10/15/2011 5:50 PM  Johnny [name]  has presented today for surgery, with the diagnosis of Chest pain  The various methods of treatment have been discussed with the patient and family. After consideration of risks, benefits and other options for treatment, the patient has consented to  Procedure(s): LEFT HEART CATHETERIZATION WITH CORONARY ANGIOGRAM as a surgical intervention .  The patients' history has been reviewed, patient examined, no change in status, stable for surgery.  I have reviewed the patients' chart and labs.  Questions were answered to the patient's satisfaction.     Tonny Bollman

## 2011-10-15 NOTE — Patient Instructions (Addendum)
Dr Shirlee Latch is admitting you to [facility] tonight.   NTG 0.4mg  SL given at 4:50pm with some relief of chest pain--BP 146/86   BP at 4:55pm 122/80  Pt transported by EMS to [facility].

## 2011-10-15 NOTE — Assessment & Plan Note (Addendum)
Johnny [name] have had an episode of SVT yesterday when blood pressure was low and HR in the 140s.  Johnny did not feel his heart racing, however, when the chest pain began.  Johnny was in NSR in the office today with ongoing chest pain.  It is possible that SVT caused his symptoms, but given the prolonged nature, I am worried coronary ischemia and will plan cath as above.  Johnny will continue metoprolol.  Johnny will be on telemetry.

## 2011-10-16 ENCOUNTER — Encounter (INDEPENDENT_AMBULATORY_CARE_PROVIDER_SITE_OTHER): Payer: Medicare Other

## 2011-10-16 DIAGNOSIS — R079 Chest pain, unspecified: Secondary | ICD-10-CM

## 2011-10-16 DIAGNOSIS — R55 Syncope and collapse: Secondary | ICD-10-CM

## 2011-10-16 DIAGNOSIS — I498 Other specified cardiac arrhythmias: Secondary | ICD-10-CM

## 2011-10-16 LAB — CARDIAC PANEL(CRET KIN+CKTOT+MB+TROPI): Relative Index: 3 — ABNORMAL HIGH (ref 0.0–2.5)

## 2011-10-16 LAB — CBC
HCT: 45.2 % (ref 39.0–52.0)
Hemoglobin: 15.9 g/dL (ref 13.0–17.0)
MCH: 31.2 pg (ref 26.0–34.0)
MCHC: 35.2 g/dL (ref 30.0–36.0)

## 2011-10-16 LAB — COMPREHENSIVE METABOLIC PANEL
ALT: 9 U/L (ref 0–53)
AST: 14 U/L (ref 0–37)
CO2: 23 mEq/L (ref 19–32)
Chloride: 106 mEq/L (ref 96–112)
GFR calc non Af Amer: 66 mL/min — ABNORMAL LOW (ref >90)
Sodium: 139 mEq/L (ref 135–145)
Total Bilirubin: 0.6 mg/dL (ref 0.3–1.2)

## 2011-10-16 NOTE — Progress Notes (Signed)
TELEMETRY: Reviewed telemetry pt in NSR: Filed Vitals:   10/15/11 2050 10/15/11 2238 10/16/11 0033 10/16/11 0430  BP: 135/76 132/86 135/69 157/89  Pulse: 77 73 67 66  Temp: 97.5 F (36.4 C)  97.8 F (36.6 C) 97.5 F (36.4 C)  TempSrc: Oral  Oral Oral  Resp: 19  15 15   Height:      Weight:      SpO2: 97%  98% 97%    Intake/Output Summary (Last 24 hours) at 10/16/11 0728 Last data filed at 10/16/11 0300  Gross per 24 hour  Intake    864 ml  Output    900 ml  Net    -36 ml    SUBJECTIVE Feels well today. No chest pain. No tachycardia. No groin complaints.  LABS: Basic Metabolic Panel:  Basename 10/16/11 0515 10/15/11 1801 10/15/11 1755  NA 139 141 --  K 3.7 3.5 --  CL 106 109 --  CO2 23 -- 19  GLUCOSE 93 106* --  BUN 17 18 --  CREATININE 1.09 1.20 --  CALCIUM 8.7 -- 9.5  MG -- -- --  PHOS -- -- --   Liver Function Tests:  [facility] 10/16/11 0515 10/15/11 1755  AST 14 16  ALT 9 12  ALKPHOS 55 66  BILITOT 0.6 0.5  PROT 6.4 7.2  ALBUMIN 3.5 4.1   No results found for this basename: LIPASE:2,AMYLASE:2 in the last 72 hours CBC:  Basename 10/16/11 0515 10/15/11 1801 10/15/11 1755  WBC 7.0 -- 8.6  NEUTROABS -- -- --  HGB 15.9 16.3 --  HCT 45.2 48.0 --  MCV 88.8 -- 87.3  PLT 179 -- 210   Cardiac Enzymes:  Basename 10/16/11 0515 10/15/11 1755  CKTOTAL 110 118  CKMB 3.3 3.6  CKMBINDEX -- --  TROPONINI <0.30 <0.30   BNP: No components found with this basename: POCBNP:3 D-Dimer: No results found for this basename: DDIMER:2 in the last 72 hours Hemoglobin A1C:  Basename 10/15/11 1755  HGBA1C 5.7*   Fasting Lipid Panel:  Basename 10/15/11 1755  CHOL 136  HDL 43  LDLCALC 46  TRIG 236*  CHOLHDL 3.2  LDLDIRECT --   Thyroid Function Tests: No results found for this basename: TSH,T4TOTAL,FREET3,T3FREE,THYROIDAB in the last 72 hours Anemia Panel: No results found for this basename: VITAMINB12,FOLATE,FERRITIN,TIBC,IRON,RETICCTPCT in the last 72  hours  Radiology/Studies:  Dg Chest Port 1 View  10/15/2011  *RADIOLOGY REPORT*  Clinical Data: Chest pain  PORTABLE CHEST - 1 VIEW  Comparison: 01/25/2008  Findings: Normal heart size.  Negative for heart failure.  Negative for pneumonia or effusion.  IMPRESSION: No active cardiopulmonary disease.  Original Report Authenticated By: Camelia Phenes, M.D.    PHYSICAL EXAM General: Well developed, well nourished, in no acute distress. Head: Normocephalic, atraumatic, sclera non-icteric, no xanthomas, nares are without discharge. Neck: Negative for carotid bruits. JVD not elevated. Lungs: Clear bilaterally to auscultation without wheezes, rales, or rhonchi. Breathing is unlabored. Heart: RRR S1 S2 without murmurs, rubs, or gallops.  Abdomen: Soft, non-tender, non-distended with normoactive bowel sounds. No hepatomegaly. No rebound/guarding. No obvious abdominal masses. Msk:  Strength and tone appears normal for age. Extremities: No clubbing, cyanosis or edema.  Distal pedal pulses are 2+ and equal bilaterally. No groin hematoma. Neuro: Alert and oriented X 3. Moves all extremities spontaneously. Psych:  Responds to questions appropriately with a normal affect.  ASSESSMENT AND PLAN: 1. Chest pain. Normal cardiac enzymes. Cardiac cath shows no obstructive disease. Etiology unclear. Possibly related to  episode of SVT. 2. Hx SVT 3. HTN 4. Hyperlipidemia 5. GERD  Plan: will DC home today. Arrange event monitor as outpatient. F/u in 2-3 weeks. Continue prior meds.   Active Problems:  * No active hospital problems. *     Signed, Eshika Reckart [place] MD,FACC

## 2011-10-16 NOTE — Discharge Summary (Signed)
Patient seen and examined and history reviewed. Agree with above findings and plan. See progress note earlier today.  Theron Arista [facility] 10/16/2011 9:06 AM

## 2011-10-16 NOTE — Discharge Summary (Signed)
Discharge Summary   Patient ID: Johnny [name],  MRN: [id number], DOB/AGE: [birth year] 71 y.o.  Admit date: 10/15/2011 Discharge date: 10/16/2011  Discharge Diagnoses Principal Problem:  *CHEST PAIN UNSPECIFIED Active Problems:  CAD  GERD  SVT (supraventricular tachycardia)  HTN (hypertension)  Dyslipidemia   Allergies No Known Allergies  Procedures  Cardiac catheterization  Procedural Findings:  Hemodynamics:  AO 116/60 with a mean of 82  LV 115/2  Coronary angiography:  Coronary dominance: right  Left mainstem: Dilated, widely patent, no obstructive disease.  Left anterior descending (LAD): There is a widely patent stent in the proximal LAD. There is diffuse ectasia in the mid LAD with slow flow. The distal LAD has diffuse nonobstructive disease with a focal 50-60% stenosis at a hinge point within a band of the vessel.  Left circumflex (LCx): The left circumflex is widely patent. There is also diffuse ectasia with some streaming of flow in the mid vessel. The left circumflex is dominant and it supplies 2 distal obtuse marginal branches and the left PDA branch. There is no high grade obstructive disease present.  Right coronary artery (RCA): Small, nondominant vessel with a dilated ostium.  Left ventriculography: Left ventricular systolic function is normal, LVEF is estimated at 55-65%, there is no significant mitral regurgitation  Aortic root angiogram: No significant aortic insufficiency, no evi aortic dissection or aneurysm  Final Conclusions:  1. Patent proximal LAD stent  2. Diffuse coronary ectasia without significant obstructive disease  3. Normal left ventricular function  4. No significant abnormality noted in the ascending aorta   History of Present Illness  [name] is a 70 yo Caucasian male with PMHx significant for CAD (s/p PCI to prox LAD 09/05, repeat cath 02/10 revealed EF 55%, patent LAD stent, nonobstructive dz), hx of SVT, HTN, HL and GERD who was  transferred from [facility] office to [facility] hospital with complaints of chest pain concerning for unstable angina.   He reported periodic episodes of atypical chest pain since PCI in 2005. On 12/18, he had a more severe episode occurring upon walking to his shop in his backyard described as severe substernal chest pain radiating to the L arm, lasting for several hours. There was associated SOB, palpitations and lightheadedness. He returned to his house, rested and took BP revealed SBP in the 70s and HR of 144. The readings persisted upon repeating. The pain remitted somewhat, he went to bed and upon awakening, the pain persisted. He was referred to [facility] office from PCP, still with cp, vitals stable, given NTG SL, resolved somewhat and transferred to [facility] for suspected unstable angina.   Hospital Course   That evening he underwent cardiac cath revealing the above details, most notably patent proximal LAD stent, diffuse coronary ectasia without significant obstructive disease and normal EF. A recommendation was made to continue 23 hour observation while cycling CEs, ordering baseline labs and portable CXR. There was no evidence of ACS. CE neg x 3, CXR unremarkable, CMET, CBC WNL. Triglycerides were found to be elevated. No evidence of chest pain inpatient, tele revealed NSR. No ischemic changes on EKG.   Today he is asymptomatic, stable, in good condition and will be discharged home today continued on outpatient medications. He will have event monitor placed today at [facility] office and follow-up in 2-3 weeks. Will recommend regularly scheduled follow-up with PCP.  Discharge Vitals:  Blood pressure 157/89, pulse 66, temperature 97.5 F (36.4 C), temperature source Oral, resp. rate 15, height 6\' 1"  (1.854  m), weight 95.7 kg (210 lb 15.7 oz), SpO2 97.00%.   Labs: Recent Labs  Basename 10/16/11 0515 10/15/11 1801 10/15/11 1755   WBC 7.0 -- 8.6   HGB 15.9 16.3 --   HCT 45.2 48.0 --   MCV 88.8 -- 87.3   PLT  179 -- 210    Lab 10/16/11 0515 10/15/11 1801 10/15/11 1755  NA 139 141 138  K 3.7 3.5 3.5  CL 106 109 104  CO2 23 -- 19  BUN 17 18 19   CREATININE 1.09 1.20 1.14  CALCIUM 8.7 -- 9.5  PROT 6.4 -- --  BILITOT 0.6 -- --  ALKPHOS 55 -- --  ALT 9 -- --  AST 14 -- --  AMYLASE -- -- --  LIPASE -- -- --  GLUCOSE 93 106* 103*   Recent Labs  Basename 10/15/11 1755   HGBA1C 5.7*   Recent Labs  [facility] 10/16/11 0515 10/15/11 1755   CKTOTAL 110 118   CKMB 3.3 3.6   CKMBINDEX -- --   TROPONINI <0.30 <0.30    Recent Labs  Basename 10/15/11 1755   CHOL 136   HDL 43   LDLCALC 46   TRIG 236*   CHOLHDL 3.2   LDLDIRECT --   Disposition: stable, in good condition Discharge Orders    Future Appointments: Provider: Department: Dept Phone: Center:   11/04/2011 9:00 AM Beatrice Lecher, PA [facility] [phone] [id number]     Follow-up Information    Follow up with Tereso Newcomer, PA on 11/04/2011. (At 9:00 AM. )    Contact information:   [address] [place] [address] [phone]       Follow up with [facility] on 10/16/2011. (At 12:30 PM for event monitor. )    Contact information:   [address] [place] [address]          Discharge Medications: stable, in good condition Current Discharge Medication List    CONTINUE these medications which have NOT CHANGED   Details  aspirin 81 MG tablet Take 81 mg by mouth daily.      atorvastatin (LIPITOR) 10 MG tablet Take 1 tablet (10 mg total) by mouth daily. Qty: 90 tablet, Refills: 3    clopidogrel (PLAVIX) 75 MG tablet Take 1 tablet (75 mg total) by mouth daily. Qty: 90 tablet, Refills: 3    Cyanocobalamin (VITAMIN B-12 IJ) Inject as directed as directed.      hydrOXYzine (ATARAX) 25 MG tablet Take 25 mg by mouth daily.      lisinopril-hydrochlorothiazide (ZESTORETIC) 20-12.5 MG per tablet Take 1 tablet by mouth 2 (two) times daily.      meclizine (ANTIVERT) 25 MG tablet Take 25 mg by mouth 3 (three) times daily as needed.      meloxicam (MOBIC) 15 MG tablet Take 15 mg by mouth daily.     metoprolol tartrate (LOPRESSOR) 25 MG tablet Take 1 tablet (25 mg total) by mouth 2 (two) times daily. Qty: 180 tablet, Refills: 3    pantoprazole (PROTONIX) 40 MG tablet Take 1 tablet (40 mg total) by mouth daily. Qty: 90 tablet, Refills: 3    potassium chloride (K-DUR,KLOR-CON) 10 MEQ tablet Take 1 tablet (10 mEq total) by mouth daily. Qty: 90 tablet, Refills: 3    nitroGLYCERIN (NITROSTAT) 0.4 MG SL tablet Place 1 tablet (0.4 mg total) under the tongue every 5 (five) minutes as needed for chest pain. Qty: 1 tablet, Refills: 0  Outstanding Labs/Studies: None reported  Duration of Discharge Encounter: 40 minutes including physician time.  Signed, R. Hurman Horn, PA-C 10/16/2011, 8:28 AM

## 2011-10-30 DIAGNOSIS — D510 Vitamin B12 deficiency anemia due to intrinsic factor deficiency: Secondary | ICD-10-CM | POA: Diagnosis not present

## 2011-11-04 ENCOUNTER — Ambulatory Visit (INDEPENDENT_AMBULATORY_CARE_PROVIDER_SITE_OTHER): Payer: Medicare Other | Admitting: Physician Assistant

## 2011-11-04 ENCOUNTER — Encounter: Payer: Self-pay | Admitting: Physician Assistant

## 2011-11-04 VITALS — BP 118/68 | HR 65 | Ht 73.0 in | Wt 215.4 lb

## 2011-11-04 DIAGNOSIS — I2510 Atherosclerotic heart disease of native coronary artery without angina pectoris: Secondary | ICD-10-CM

## 2011-11-04 DIAGNOSIS — E785 Hyperlipidemia, unspecified: Secondary | ICD-10-CM

## 2011-11-04 DIAGNOSIS — R079 Chest pain, unspecified: Secondary | ICD-10-CM | POA: Diagnosis not present

## 2011-11-04 DIAGNOSIS — I498 Other specified cardiac arrhythmias: Secondary | ICD-10-CM | POA: Diagnosis not present

## 2011-11-04 DIAGNOSIS — I10 Essential (primary) hypertension: Secondary | ICD-10-CM

## 2011-11-04 DIAGNOSIS — I471 Supraventricular tachycardia, unspecified: Secondary | ICD-10-CM

## 2011-11-04 DIAGNOSIS — K219 Gastro-esophageal reflux disease without esophagitis: Secondary | ICD-10-CM

## 2011-11-04 DIAGNOSIS — E039 Hypothyroidism, unspecified: Secondary | ICD-10-CM | POA: Insufficient documentation

## 2011-11-04 MED ORDER — PANTOPRAZOLE SODIUM 40 MG PO TBEC: 40.0000 mg | DELAYED_RELEASE_TABLET | Freq: Two times a day (BID) | ORAL | Status: DC

## 2011-11-04 NOTE — Assessment & Plan Note (Addendum)
Lab Results  Component Value Date   TSH 6.034* 10/16/2011    Adjustments in Synthroid will be per his PCP.

## 2011-11-04 NOTE — Assessment & Plan Note (Signed)
As noted, his anatomy was stable a cardiac catheterization.  Continue aspirin and Plavix.

## 2011-11-04 NOTE — Assessment & Plan Note (Signed)
No evidence of recurrence thus far on his monitor.  Followup in one month as noted.

## 2011-11-04 NOTE — Patient Instructions (Signed)
Your physician recommends that you schedule a follow-up appointment in: 1 month with Dr.Jordan  Increase Protonix 40 mg twice daily.

## 2011-11-04 NOTE — Assessment & Plan Note (Addendum)
He continues to have symptoms.  Of note, he has had chest symptoms for many years.  His chest symptoms were much worse the day he was admitted to the hospital.  Thus far, his monitor does not demonstrate any significant arrhythmias.  I question whether or not his symptoms Sepulveda be related to a gastrointestinal etiology.  I have asked him to increase his Protonix to 40 mg twice a day.  He will complete his monitor.  I will bring him back in followup with Dr. Swaz[place]and in one month.  We [place] need to consider referring him back to gastroenterology at that point given the findings on his completed monitor and his symptoms.

## 2011-11-04 NOTE — Assessment & Plan Note (Addendum)
Increase Protonix as noted.  Consider referral back to gastroenterology at followup

## 2011-11-04 NOTE — Assessment & Plan Note (Signed)
Controlled.  Continue current therapy.  

## 2011-11-04 NOTE — Progress Notes (Signed)
[address] [place], [place]  [address] Phone: [phone] Fax:  [phone]  Date:  11/04/2011   Name:  Johnny [name]       DOB:  [birth year] MRN:  [id number]  PCP:  Dr. Jacky Kindle Primary Cardiologist:  Dr. Peter Swaziland  Primary Electrophysiologist:  None    History of Present Illness: Johnny [name] is a 71 y.o. male who presents for post hospital follow up.  He Has a history of CAD, status post prior PCI and SVT.  He was seen by Dr. Marca Ancona 12/19 with complaints of chest pain and palpitations.  MI was ruled out.  LHC 10/15/11: LAD stent patent with mid ectasia, distal LAD 50-60%, mid circumflex with ectasia, EF 55-65%.  It was questioned whether or not his symptoms [name] have been related to SVT an outpatient event monitor was arranged.  Labs: Hemoglobin 15.9, potassium 3.7, creatinine 1.09, ALT 9, TSH 6.034, hemoglobin A1c 5.7, TC 136, HDL 43, LDL 46, TG 236.  Chest x-ray was unremarkable.  I have reviewed the available strips today from the patient's event monitor.  Thus far, his event monitor demonstrates sinus rhythm, sinus tachycardia and rare PVCs/PACs.  No significant arrhythmia has been noted.  He still notes occasional chest discomfort.  He cannot really describe this.  It does not seem to be aggravated or alleviated by any thing in particular.  It lasts a good part of the day when it occurs.  He does note significant belching.  He has a history of GERD.  He apparently had an EGD in the recent past that demonstrated esophageal stricture.  However, for unclear reasons, he did not have dilatation performed.  His symptoms do not seem to be related to meals.  He does have chronic dyspnea with exertion.  This is unchanged.  He denies orthopnea, PND or significant pedal edema.  He denies syncope.  He denies further palpitations.  Past Medical History  Diagnosis Date  . Coronary artery disease     LHC 10/15/11: LAD stent patent with mid ectasia, distal LAD 50-60%,  mid circumflex with ectasia, EF 55-65%.  . SVT (supraventricular tachycardia)   . Dyslipidemia   . Hypertension   . GERD (gastroesophageal reflux disease)   . Glucose intolerance (pre-diabetes)     Borderline glucose intolerance  . Acromioclavicular joint arthritis   . Rotator cuff tear   . Shortness of breath 10/15/11    "here lately I've been having it on & off any time"  . Post concussive syndrome 2011    "for ~ 6-8 months"  . Hypothyroidism   . Anemia   . B12 deficiency 10/15/11    "take injections q 28 days"    Current Outpatient Prescriptions  Medication Sig Dispense Refill  . aspirin 81 MG tablet Take 81 mg by mouth daily.        [name] atorvastatin (LIPITOR) 10 MG tablet Take 1 tablet (10 mg total) by mouth daily.  90 tablet  3  . clopidogrel (PLAVIX) 75 MG tablet Take 1 tablet (75 mg total) by mouth daily.  90 tablet  3  . Cyanocobalamin (VITAMIN B-12 IJ) Inject as directed as directed.        . hydrOXYzine (ATARAX) 25 MG tablet Take 25 mg by mouth daily.        [name] levothyroxine (SYNTHROID, LEVOTHROID) 25 MCG tablet Take 25 mcg by mouth daily.        [name] lisinopril-hydrochlorothiazide (ZESTORETIC) 20-12.5 MG per tablet  Take 1 tablet by mouth 2 (two) times daily.      . meclizine (ANTIVERT) 25 MG tablet Take 25 mg by mouth 3 (three) times daily as needed.        . meloxicam (MOBIC) 15 MG tablet Take 15 mg by mouth daily.       . metoprolol tartrate (LOPRESSOR) 25 MG tablet Take 1 tablet (25 mg total) by mouth 2 (two) times daily.  180 tablet  3  . nitroGLYCERIN (NITROSTAT) 0.4 MG SL tablet Place 1 tablet (0.4 mg total) under the tongue every 5 (five) minutes as needed for chest pain.  1 tablet  0  . pantoprazole (PROTONIX) 40 MG tablet Take 1 tablet (40 mg total) by mouth daily.  90 tablet  3  . potassium chloride (K-DUR,KLOR-CON) 10 MEQ tablet Take 1 tablet (10 mEq total) by mouth daily.  90 tablet  3    Allergies: No Known Allergies  History  Substance Use Topics  . Smoking  status: Former Smoker -- 1.0 packs/day for 21 years    Types: Cigarettes    Quit date: 04/07/1976  . Smokeless tobacco: Never Used  . Alcohol Use: Yes     10/15/11 "haven't drank in 15-20 years"     ROS:  Please see the history of present illness.   He denies dysphagia or odynophagia.  He denies melena or hematochezia.  All other systems reviewed and negative.   PHYSICAL EXAM: VS:  BP 118/68  Pulse 65  Ht 6\' 1"  (1.854 m)  Wt 215 lb 6.4 oz (97.705 kg)  BMI 28.42 kg/m2 Well nourished, well developed, in no acute distress HEENT: normal Neck: no JVD Cardiac:  normal S1, S2; RRR; no murmur Lungs:  Decreased breath sounds bilaterally, no wheezing, rhonchi or rales Abd: soft, nontender, no hepatomegaly Ext: no edema; RFA site without hematoma or bruit Skin: warm and dry Neuro:  CNs 2-12 intact, no focal abnormalities noted  EKG:   Sinus rhythm, heart rate 65, left axis deviation, incomplete right bundle branch block, nonspecific ST-T wave changes, no change from prior  ASSESSMENT AND PLAN:

## 2011-11-04 NOTE — Assessment & Plan Note (Signed)
Managed by primary care.  LDL in the hospital was optimal.

## 2011-11-27 DIAGNOSIS — D510 Vitamin B12 deficiency anemia due to intrinsic factor deficiency: Secondary | ICD-10-CM | POA: Diagnosis not present

## 2011-12-09 ENCOUNTER — Ambulatory Visit (INDEPENDENT_AMBULATORY_CARE_PROVIDER_SITE_OTHER): Payer: Medicare Other | Admitting: Cardiology

## 2011-12-09 ENCOUNTER — Encounter: Payer: Self-pay | Admitting: Cardiology

## 2011-12-09 VITALS — BP 122/78 | HR 82 | Ht 72.0 in | Wt 211.6 lb

## 2011-12-09 DIAGNOSIS — R0989 Other specified symptoms and signs involving the circulatory and respiratory systems: Secondary | ICD-10-CM | POA: Diagnosis not present

## 2011-12-09 DIAGNOSIS — I498 Other specified cardiac arrhythmias: Secondary | ICD-10-CM

## 2011-12-09 DIAGNOSIS — I2510 Atherosclerotic heart disease of native coronary artery without angina pectoris: Secondary | ICD-10-CM

## 2011-12-09 DIAGNOSIS — I471 Supraventricular tachycardia: Secondary | ICD-10-CM

## 2011-12-09 DIAGNOSIS — R0609 Other forms of dyspnea: Secondary | ICD-10-CM | POA: Diagnosis not present

## 2011-12-09 DIAGNOSIS — I10 Essential (primary) hypertension: Secondary | ICD-10-CM

## 2011-12-09 DIAGNOSIS — R0600 Dyspnea, unspecified: Secondary | ICD-10-CM

## 2011-12-09 NOTE — Assessment & Plan Note (Signed)
No documented recurrence by about monitor. We will continue with his current dose of metoprolol.

## 2011-12-09 NOTE — Progress Notes (Signed)
Johnny [name] Date of Birth: 07-Jun-1941   History of Present Illness: [name] is seen for followup of his coronary disease today. He underwent extensive evaluation in December including cardiac catheterization which demonstrated mild nonobstructive disease. He subsequently wore an event monitor which demonstrated rare PACs and PVCs without any episodes of SVT. He still complains of feeling short of breath. His laboratory evaluation in the hospital showed hemoglobin of 15.9. His TSH was only mildly elevated.  Current Outpatient Prescriptions on File Prior to Visit  Medication Sig Dispense Refill  . aspirin 81 MG tablet Take 81 mg by mouth daily.        [name] atorvastatin (LIPITOR) 10 MG tablet Take 1 tablet (10 mg total) by mouth daily.  90 tablet  3  . clopidogrel (PLAVIX) 75 MG tablet Take 1 tablet (75 mg total) by mouth daily.  90 tablet  3  . Cyanocobalamin (VITAMIN B-12 IJ) Inject as directed as directed.        . hydrOXYzine (ATARAX) 25 MG tablet Take 25 mg by mouth daily.        [name] levothyroxine (SYNTHROID, LEVOTHROID) 25 MCG tablet Take 25 mcg by mouth daily.        [name] lisinopril-hydrochlorothiazide (ZESTORETIC) 20-12.5 MG per tablet Take 1 tablet by mouth 2 (two) times daily.      . meclizine (ANTIVERT) 25 MG tablet Take 25 mg by mouth 2 (two) times daily.       . meloxicam (MOBIC) 15 MG tablet Take 15 mg by mouth daily.       . metoprolol tartrate (LOPRESSOR) 25 MG tablet Take 1 tablet (25 mg total) by mouth 2 (two) times daily.  180 tablet  3  . nitroGLYCERIN (NITROSTAT) 0.4 MG SL tablet Place 1 tablet (0.4 mg total) under the tongue every 5 (five) minutes as needed for chest pain.  1 tablet  0  . pantoprazole (PROTONIX) 40 MG tablet Take 1 tablet (40 mg total) by mouth 2 (two) times daily.  180 tablet  3  . potassium chloride (K-DUR,KLOR-CON) 10 MEQ tablet Take 1 tablet (10 mEq total) by mouth daily.  90 tablet  3    No Known Allergies  Past Medical History  Diagnosis Date  .  Coronary artery disease     LHC 10/15/11: LAD stent patent with mid ectasia, distal LAD 50-60%, mid circumflex with ectasia, EF 55-65%.  . SVT (supraventricular tachycardia)   . Dyslipidemia   . Hypertension   . GERD (gastroesophageal reflux disease)   . Glucose intolerance (pre-diabetes)     Borderline glucose intolerance  . Acromioclavicular joint arthritis   . Rotator cuff tear   . Shortness of breath 10/15/11    "here lately I've been having it on & off any time"  . Post concussive syndrome 2011    "for ~ 6-8 months"  . Hypothyroidism   . Anemia   . B12 deficiency 10/15/11    "take injections q 28 days"    Past Surgical History  Procedure Date  . Lumbar laminectomy 1970's  . Replacement total knee 12/2007    left  . Colonoscopy w/ endoscopic [place]   . Cardiovascular stress test 10/2010  . Shoulder surgery 02/11/2011    right  . Nasal sinus surgery     x2  . Resection tumor clavicle radical     Open distal clavicle resection  . Reconstructive shoulder surgery     right  . Joint replacement   . Carpal tunnel  release     bilaterally  . Trigger finger release     ? both hands  . Back surgery   . Cardiac catheterization 12/19/08    NORMAL. EF 55%  . Cardiac catheterization 10/15/11  . Coronary angioplasty with stent placement 9/ 2005     History  Smoking status  . Former Smoker -- 1.0 packs/day for 21 years  . Types: Cigarettes  . Quit date: 04/07/1976  Smokeless tobacco  . Never Used    History  Alcohol Use  . Yes    10/15/11 "haven't drank in 15-20 years"    Family History  Problem Relation Age of Onset  . Stroke Mother   . Heart attack Father   . Diabetes Father   . Kidney failure Father     Review of Systems: As noted in history of present illness.  All other systems were reviewed and are negative.  Physical Exam: BP 122/78  Pulse 82  Ht 6' (1.829 m)  Wt 211 lb 9.6 oz (95.981 kg)  BMI 28.70 kg/m2 He is a pleasant white male in no acute  distress. HEENT exam is unremarkable. He has no JVD or bruits. Lungs are clear. Cardiac exam reveals a regular rate and rhythm without gallop or murmur. His right arm is in a sling. Abdomen is soft and nontender. He has no edema. Pedal pulses are good. He is alert and oriented x3. Cranial nerves II through XII are intact. LABORATORY DATA:   Assessment / Plan:

## 2011-12-09 NOTE — Patient Instructions (Signed)
Continue your current medication.  Stay active.   I will see you again in 6 months.

## 2011-12-09 NOTE — Assessment & Plan Note (Signed)
Recent cardiac catheterization in December showed nonobstructive coronary disease. He had normal LV function. He still has symptoms of dyspnea but I don't feel that these symptoms are cardiac related. I recommend he increase his aerobic activity.

## 2011-12-09 NOTE — Assessment & Plan Note (Signed)
Blood pressure is under excellent control. 

## 2012-01-27 DIAGNOSIS — D510 Vitamin B12 deficiency anemia due to intrinsic factor deficiency: Secondary | ICD-10-CM | POA: Diagnosis not present

## 2012-02-24 DIAGNOSIS — D510 Vitamin B12 deficiency anemia due to intrinsic factor deficiency: Secondary | ICD-10-CM | POA: Diagnosis not present

## 2012-03-23 DIAGNOSIS — D510 Vitamin B12 deficiency anemia due to intrinsic factor deficiency: Secondary | ICD-10-CM | POA: Diagnosis not present

## 2012-04-12 ENCOUNTER — Encounter: Payer: Self-pay | Admitting: Cardiology

## 2012-04-12 ENCOUNTER — Ambulatory Visit (INDEPENDENT_AMBULATORY_CARE_PROVIDER_SITE_OTHER): Payer: Medicare Other | Admitting: Cardiology

## 2012-04-12 VITALS — BP 116/62 | HR 68 | Ht 72.0 in | Wt 206.0 lb

## 2012-04-12 DIAGNOSIS — I10 Essential (primary) hypertension: Secondary | ICD-10-CM

## 2012-04-12 DIAGNOSIS — I498 Other specified cardiac arrhythmias: Secondary | ICD-10-CM

## 2012-04-12 DIAGNOSIS — I2510 Atherosclerotic heart disease of native coronary artery without angina pectoris: Secondary | ICD-10-CM | POA: Diagnosis not present

## 2012-04-12 DIAGNOSIS — I471 Supraventricular tachycardia: Secondary | ICD-10-CM

## 2012-04-12 NOTE — Patient Instructions (Signed)
Avoid caffeine  If your heart races take an extra metoprolol.  If this happens frequently let me know.  I will see you again in 6 months.

## 2012-04-12 NOTE — Assessment & Plan Note (Signed)
He is having infrequent episodes of SVT. I recommended that if he has an episode he should take an extra metoprolol. If these episodes occur more frequently we will need to titrate his dose up daily.

## 2012-04-12 NOTE — Progress Notes (Signed)
Johnny [name] Date of Birth: January 04, 1941   History of Present Illness: Johnny [name] is seen for followup today. He states that since his last visit he has had a couple episodes of elevated heart rate lasting as long as 3-4 hours. He feels a crazy sensation in his chest and notes that his blood pressure drops. He feels very fatigued. He thinks these episodes Johnny [name] of been related to stress. He denies any significant anginal symptoms. He does have a constant low-grade chest soreness. He has lost 5 pounds since his last visit.  Current Outpatient Prescriptions on File Prior to Visit  Medication Sig Dispense Refill  . aspirin 81 MG tablet Take 81 mg by mouth daily.        [name] atorvastatin (LIPITOR) 10 MG tablet Take 1 tablet (10 mg total) by mouth daily.  90 tablet  3  . clopidogrel (PLAVIX) 75 MG tablet Take 1 tablet (75 mg total) by mouth daily.  90 tablet  3  . Cyanocobalamin (VITAMIN B-12 IJ) Inject as directed as directed.        . hydrOXYzine (ATARAX) 25 MG tablet Take 25 mg by mouth daily.        [name] levothyroxine (SYNTHROID, LEVOTHROID) 25 MCG tablet Take 25 mcg by mouth daily.        [name] lisinopril-hydrochlorothiazide (ZESTORETIC) 20-12.5 MG per tablet Take 1 tablet by mouth 2 (two) times daily.      . meclizine (ANTIVERT) 25 MG tablet Take 25 mg by mouth 2 (two) times daily.       . meloxicam (MOBIC) 15 MG tablet Take 15 mg by mouth daily.       . metoprolol tartrate (LOPRESSOR) 25 MG tablet Take 1 tablet (25 mg total) by mouth 2 (two) times daily.  180 tablet  3  . nitroGLYCERIN (NITROSTAT) 0.4 MG SL tablet Place 1 tablet (0.4 mg total) under the tongue every 5 (five) minutes as needed for chest pain.  1 tablet  0  . pantoprazole (PROTONIX) 40 MG tablet Take 1 tablet (40 mg total) by mouth 2 (two) times daily.  180 tablet  3  . potassium chloride (K-DUR,KLOR-CON) 10 MEQ tablet Take 1 tablet (10 mEq total) by mouth daily.  90 tablet  3    No Known Allergies  Past Medical History  Diagnosis Date    . Coronary artery disease     LHC 10/15/11: LAD stent patent with mid ectasia, distal LAD 50-60%, mid circumflex with ectasia, EF 55-65%.  . SVT (supraventricular tachycardia)   . Dyslipidemia   . Hypertension   . GERD (gastroesophageal reflux disease)   . Glucose intolerance (pre-diabetes)     Borderline glucose intolerance  . Acromioclavicular joint arthritis   . Rotator cuff tear   . Shortness of breath 10/15/11    "here lately I've been having it on & off any time"  . Post concussive syndrome 2011    "for ~ 6-8 months"  . Hypothyroidism   . Anemia   . B12 deficiency 10/15/11    "take injections q 28 days"    Past Surgical History  Procedure Date  . Lumbar laminectomy 1970's  . Replacement total knee 12/2007    left  . Colonoscopy w/ endoscopic [place]   . Cardiovascular stress test 10/2010  . Shoulder surgery 02/11/2011    right  . Nasal sinus surgery     x2  . Resection tumor clavicle radical     Open distal clavicle resection  .  Reconstructive shoulder surgery     right  . Joint replacement   . Carpal tunnel release     bilaterally  . Trigger finger release     ? both hands  . Back surgery   . Cardiac catheterization 12/19/08    NORMAL. EF 55%  . Cardiac catheterization 10/15/11  . Coronary angioplasty with stent placement 9/ 2005     History  Smoking status  . Former Smoker -- 1.0 packs/day for 21 years  . Types: Cigarettes  . Quit date: 04/07/1976  Smokeless tobacco  . Never Used    History  Alcohol Use  . Yes    10/15/11 "haven't drank in 15-20 years"    Family History  Problem Relation Age of Onset  . Stroke Mother   . Heart attack Father   . Diabetes Father   . Kidney failure Father     Review of Systems: As noted in history of present illness.  All other systems were reviewed and are negative.  Physical Exam: BP 116/62  Pulse 68  Ht 6' (1.829 m)  Wt 93.441 kg (206 lb)  BMI 27.94 kg/m2 He is a pleasant white male in no acute distress.  HEENT exam is unremarkable. He has no JVD or bruits. Lungs are clear. Cardiac exam reveals a regular rate and rhythm without gallop or murmur. His right arm is in a sling. Abdomen is soft and nontender. He has no edema. Pedal pulses are good. He is alert and oriented x3. Cranial nerves II through XII are intact. LABORATORY DATA:   Assessment / Plan:

## 2012-04-12 NOTE — Assessment & Plan Note (Signed)
His last cardiac catheterization in February 2010 showed nonobstructive disease. He had a normal stress test in December of 2011. We will continue with risk factor modification.

## 2012-04-20 ENCOUNTER — Encounter: Payer: Self-pay | Admitting: Cardiology

## 2012-04-20 DIAGNOSIS — Z125 Encounter for screening for malignant neoplasm of prostate: Secondary | ICD-10-CM | POA: Diagnosis not present

## 2012-04-20 DIAGNOSIS — D510 Vitamin B12 deficiency anemia due to intrinsic factor deficiency: Secondary | ICD-10-CM | POA: Diagnosis not present

## 2012-04-20 DIAGNOSIS — I2510 Atherosclerotic heart disease of native coronary artery without angina pectoris: Secondary | ICD-10-CM | POA: Diagnosis not present

## 2012-04-20 DIAGNOSIS — I10 Essential (primary) hypertension: Secondary | ICD-10-CM | POA: Diagnosis not present

## 2012-04-20 DIAGNOSIS — E039 Hypothyroidism, unspecified: Secondary | ICD-10-CM | POA: Diagnosis not present

## 2012-04-26 DIAGNOSIS — D510 Vitamin B12 deficiency anemia due to intrinsic factor deficiency: Secondary | ICD-10-CM | POA: Diagnosis not present

## 2012-04-26 DIAGNOSIS — I10 Essential (primary) hypertension: Secondary | ICD-10-CM | POA: Diagnosis not present

## 2012-04-26 DIAGNOSIS — I2510 Atherosclerotic heart disease of native coronary artery without angina pectoris: Secondary | ICD-10-CM | POA: Diagnosis not present

## 2012-04-26 DIAGNOSIS — Z125 Encounter for screening for malignant neoplasm of prostate: Secondary | ICD-10-CM | POA: Diagnosis not present

## 2012-04-26 DIAGNOSIS — E039 Hypothyroidism, unspecified: Secondary | ICD-10-CM | POA: Diagnosis not present

## 2012-04-26 DIAGNOSIS — Z0000 Encounter for general adult medical examination without abnormal findings: Secondary | ICD-10-CM | POA: Diagnosis not present

## 2012-04-27 DIAGNOSIS — Z1212 Encounter for screening for malignant neoplasm of rectum: Secondary | ICD-10-CM | POA: Diagnosis not present

## 2012-05-18 DIAGNOSIS — D510 Vitamin B12 deficiency anemia due to intrinsic factor deficiency: Secondary | ICD-10-CM | POA: Diagnosis not present

## 2012-06-15 DIAGNOSIS — D510 Vitamin B12 deficiency anemia due to intrinsic factor deficiency: Secondary | ICD-10-CM | POA: Diagnosis not present

## 2012-06-30 ENCOUNTER — Other Ambulatory Visit: Payer: Self-pay | Admitting: *Deleted

## 2012-06-30 MED ORDER — POTASSIUM CHLORIDE CRYS ER 10 MEQ PO TBCR: 10.0000 meq | EXTENDED_RELEASE_TABLET | Freq: Every day | ORAL | Status: DC

## 2012-06-30 MED ORDER — METOPROLOL TARTRATE 25 MG PO TABS: 25.0000 mg | ORAL_TABLET | Freq: Two times a day (BID) | ORAL | Status: DC

## 2012-06-30 MED ORDER — ATORVASTATIN CALCIUM 10 MG PO TABS: 10.0000 mg | ORAL_TABLET | Freq: Every day | ORAL | Status: DC

## 2012-06-30 MED ORDER — CLOPIDOGREL BISULFATE 75 MG PO TABS: 75.0000 mg | ORAL_TABLET | Freq: Every day | ORAL | Status: DC

## 2012-07-02 ENCOUNTER — Telehealth: Payer: Self-pay

## 2012-07-02 MED ORDER — ATORVASTATIN CALCIUM 10 MG PO TABS: 10.0000 mg | ORAL_TABLET | Freq: Every day | ORAL | Status: DC

## 2012-07-02 MED ORDER — CLOPIDOGREL BISULFATE 75 MG PO TABS: 75.0000 mg | ORAL_TABLET | Freq: Every day | ORAL | Status: DC

## 2012-07-02 MED ORDER — METOPROLOL TARTRATE 25 MG PO TABS: 25.0000 mg | ORAL_TABLET | Freq: Two times a day (BID) | ORAL | Status: DC

## 2012-07-02 MED ORDER — POTASSIUM CHLORIDE CRYS ER 10 MEQ PO TBCR: 10.0000 meq | EXTENDED_RELEASE_TABLET | Freq: Every day | ORAL | Status: DC

## 2012-07-02 NOTE — Telephone Encounter (Signed)
Received a fax request from [facility] requesting refills on plavix,atorvastatin,metoprolol,potassium.Refills sent in 07/02/12.

## 2012-07-07 ENCOUNTER — Telehealth: Payer: Self-pay | Admitting: Cardiology

## 2012-07-07 NOTE — Telephone Encounter (Signed)
Called pt to see what medication he needed called into to his local pharmacy, spoke with his wife she was unsure and would have him call back ASAP. Caralee Ates, CMA

## 2012-07-07 NOTE — Telephone Encounter (Signed)
Pt needs 7 days  worth , to last until mail order gets there, pt out, uses  [facility] pharmacy [phone]

## 2012-07-07 NOTE — Telephone Encounter (Signed)
Pt called and stated he needed a 7 day of metoprolol tartrate (LOPRESSORE) 25 MG 1 tablet bid, called into [facility], [place] his Mail order will not be in until Friday he stated he took his last pill this morning.  Caralee Ates,  CMA  Called in metoprolol tartrate (LOPROSSORE) 25 MG 1 tablet bid, 14 tablets with no refills to [facility], [place] . Caralee Ates, CMA

## 2012-07-14 DIAGNOSIS — D510 Vitamin B12 deficiency anemia due to intrinsic factor deficiency: Secondary | ICD-10-CM | POA: Diagnosis not present

## 2012-07-15 DIAGNOSIS — Z23 Encounter for immunization: Secondary | ICD-10-CM | POA: Diagnosis not present

## 2012-08-11 DIAGNOSIS — D510 Vitamin B12 deficiency anemia due to intrinsic factor deficiency: Secondary | ICD-10-CM | POA: Diagnosis not present

## 2012-09-08 DIAGNOSIS — D510 Vitamin B12 deficiency anemia due to intrinsic factor deficiency: Secondary | ICD-10-CM | POA: Diagnosis not present

## 2012-10-06 DIAGNOSIS — D510 Vitamin B12 deficiency anemia due to intrinsic factor deficiency: Secondary | ICD-10-CM | POA: Diagnosis not present

## 2012-10-12 ENCOUNTER — Ambulatory Visit (INDEPENDENT_AMBULATORY_CARE_PROVIDER_SITE_OTHER): Payer: Medicare Other | Admitting: Cardiology

## 2012-10-12 ENCOUNTER — Encounter: Payer: Self-pay | Admitting: Cardiology

## 2012-10-12 VITALS — BP 118/70 | HR 66 | Ht 72.0 in | Wt 201.0 lb

## 2012-10-12 DIAGNOSIS — I471 Supraventricular tachycardia, unspecified: Secondary | ICD-10-CM

## 2012-10-12 DIAGNOSIS — E785 Hyperlipidemia, unspecified: Secondary | ICD-10-CM | POA: Diagnosis not present

## 2012-10-12 DIAGNOSIS — I10 Essential (primary) hypertension: Secondary | ICD-10-CM

## 2012-10-12 DIAGNOSIS — I498 Other specified cardiac arrhythmias: Secondary | ICD-10-CM

## 2012-10-12 DIAGNOSIS — I2510 Atherosclerotic heart disease of native coronary artery without angina pectoris: Secondary | ICD-10-CM

## 2012-10-12 NOTE — Patient Instructions (Signed)
Stop all caffeine.  Continue your current medication  I will see you again in 6 months.  If you have more episodes of tachycardia.

## 2012-10-12 NOTE — Progress Notes (Signed)
Johnny [name] Date of Birth: 07-11-1941   History of Present Illness: Mr. Daft is seen for followup today. He has generally done well from a cardiac standpoint. On October 13 he had an episode of tachycardia with a heart rate up to 147 beats per minute. This was associated with hypotension with blood pressure down to 70/52. He did become very lightheaded and had to lie down. This lasted about 3-4 hours and then resolved. He denied any chest pain or shortness of breath. This is the only episode he has had over the past 6 months.  Current Outpatient Prescriptions on File Prior to Visit  Medication Sig Dispense Refill  . aspirin 81 MG tablet Take 81 mg by mouth daily.        [name] atorvastatin (LIPITOR) 10 MG tablet Take 1 tablet (10 mg total) by mouth daily.  90 tablet  3  . clopidogrel (PLAVIX) 75 MG tablet Take 1 tablet (75 mg total) by mouth daily.  90 tablet  3  . Cyanocobalamin (VITAMIN B-12 IJ) Inject as directed as directed.        . hydrOXYzine (ATARAX) 25 MG tablet Take 25 mg by mouth daily.        [name] levothyroxine (SYNTHROID, LEVOTHROID) 25 MCG tablet Take 25 mcg by mouth daily.        . meclizine (ANTIVERT) 25 MG tablet Take 25 mg by mouth 2 (two) times daily.       . meloxicam (MOBIC) 15 MG tablet Take 15 mg by mouth daily.       . metoprolol tartrate (LOPRESSOR) 25 MG tablet Take 1 tablet (25 mg total) by mouth 2 (two) times daily.  180 tablet  3  . nitroGLYCERIN (NITROSTAT) 0.4 MG SL tablet Place 1 tablet (0.4 mg total) under the tongue every 5 (five) minutes as needed for chest pain.  1 tablet  0  . pantoprazole (PROTONIX) 40 MG tablet Take 1 tablet (40 mg total) by mouth 2 (two) times daily.  180 tablet  3  . potassium chloride (K-DUR,KLOR-CON) 10 MEQ tablet Take 1 tablet (10 mEq total) by mouth daily.  90 tablet  3  . lisinopril-hydrochlorothiazide (ZESTORETIC) 20-12.5 MG per tablet Take 1 tablet by mouth 2 (two) times daily.        No Known Allergies  Past Medical History   Diagnosis Date  . Coronary artery disease     LHC 10/15/11: LAD stent patent with mid ectasia, distal LAD 50-60%, mid circumflex with ectasia, EF 55-65%.  . SVT (supraventricular tachycardia)   . Dyslipidemia   . Hypertension   . GERD (gastroesophageal reflux disease)   . Glucose intolerance (pre-diabetes)     Borderline glucose intolerance  . Acromioclavicular joint arthritis   . Rotator cuff tear   . Shortness of breath 10/15/11    "here lately I've been having it on & off any time"  . Post concussive syndrome 2011    "for ~ 6-8 months"  . Hypothyroidism   . Anemia   . B12 deficiency 10/15/11    "take injections q 28 days"    Past Surgical History  Procedure Date  . Lumbar laminectomy 1970's  . Replacement total knee 12/2007    left  . Colonoscopy w/ endoscopic [place]   . Cardiovascular stress test 10/2010  . Shoulder surgery 02/11/2011    right  . Nasal sinus surgery     x2  . Resection tumor clavicle radical     Open distal clavicle  resection  . Reconstructive shoulder surgery     right  . Joint replacement   . Carpal tunnel release     bilaterally  . Trigger finger release     ? both hands  . Back surgery   . Cardiac catheterization 12/19/08    NORMAL. EF 55%  . Cardiac catheterization 10/15/11  . Coronary angioplasty with stent placement 9/ 2005     History  Smoking status  . Former Smoker -- 1.0 packs/day for 21 years  . Types: Cigarettes  . Quit date: 04/07/1976  Smokeless tobacco  . Never Used    History  Alcohol Use  . Yes    Comment: 10/15/11 "haven't drank in 15-20 years"    Family History  Problem Relation Age of Onset  . Stroke Mother   . Heart attack Father   . Diabetes Father   . Kidney failure Father     Review of Systems: As noted in history of present illness.  All other systems were reviewed and are negative.  Physical Exam: BP 118/70  Pulse 66  Ht 6' (1.829 m)  Wt 91.173 kg (201 lb)  BMI 27.26 kg/m2 He is a pleasant white  male in no acute distress. HEENT exam is unremarkable. He has no JVD or bruits. Lungs are clear. Cardiac exam reveals a regular rate and rhythm without gallop or murmur. His right arm is in a sling. Abdomen is soft and nontender. He has no edema. Pedal pulses are good. He is alert and oriented x3. Cranial nerves II through XII are intact. LABORATORY DATA: ECG today demonstrates normal sinus rhythm with a normal ECG. Laboratory data from 04/20/2012 showed a normal complete chemistry panel and CBC. Total cholesterol 146, triglycerides 210, HDL 35, LDL 69. TSH was 1.98 with a free T4 of 1.  Assessment / Plan: 1. SVT. Fortunately his episodes have been very infrequent but he does have significant hypotension associated with his episodes. We will continue his current dose of metoprolol. If he has more frequent episodes we E[place]ole need to consider him for ablative therapy.  2. Coronary disease. Cardiac catheterization 2010 showed nonobstructive disease. His last stress test in December of 2011 was normal.  3. Dyslipidemia, well controlled.  4. Hypertension, well controlled.

## 2012-11-03 DIAGNOSIS — D510 Vitamin B12 deficiency anemia due to intrinsic factor deficiency: Secondary | ICD-10-CM | POA: Diagnosis not present

## 2012-11-03 DIAGNOSIS — E039 Hypothyroidism, unspecified: Secondary | ICD-10-CM | POA: Diagnosis not present

## 2012-11-03 DIAGNOSIS — I2510 Atherosclerotic heart disease of native coronary artery without angina pectoris: Secondary | ICD-10-CM | POA: Diagnosis not present

## 2012-11-03 DIAGNOSIS — I10 Essential (primary) hypertension: Secondary | ICD-10-CM | POA: Diagnosis not present

## 2012-12-01 DIAGNOSIS — D510 Vitamin B12 deficiency anemia due to intrinsic factor deficiency: Secondary | ICD-10-CM | POA: Diagnosis not present

## 2012-12-14 ENCOUNTER — Telehealth: Payer: Self-pay | Admitting: Cardiology

## 2012-12-14 MED ORDER — PANTOPRAZOLE SODIUM 40 MG PO TBEC: 40.0000 mg | DELAYED_RELEASE_TABLET | Freq: Two times a day (BID) | ORAL | Status: DC

## 2012-12-14 NOTE — Addendum Note (Signed)
Addended by: Micki Riley C on: 12/14/2012 01:53 PM   Modules accepted: Orders

## 2012-12-14 NOTE — Telephone Encounter (Signed)
I reassured the patient i will send in his pantoprazole 40mg  twice daily. He ok'd and said he had other medications to refill but he rather let [facility] notify us like before. I let him know that when he let's them know to tell them in advance b/c it Grater take up to 48 business hours to refill. He verbally ok'd.  He also said [facility] had faxed [place] last Wednesday evening about his pantoprazole. I let him no we were closed Wednesday -Friday and apologized for this inconvenience. He then again ok'd and hang up.    Micki Riley, CMA

## 2012-12-14 NOTE — Telephone Encounter (Signed)
Per refill line  Pam at [facility] pharmacy has questions about patients Protonix

## 2012-12-14 NOTE — Telephone Encounter (Signed)
Johnny [name]

## 2012-12-14 NOTE — Telephone Encounter (Signed)
Patient called upset saying his rx for pantoprazole was not in the pharmacy as he requested and asked last week. i let him know that Dr Elvis Coil nurse called [facility] this morning to verify rx and he told me I was not listening and began to scream does he have to come up to make [place] understand his rx is not in the pharmacy and he is out of medication. I told him we can refill his protonix and he told me that it was sent in as  Once daily not twice daily and it was wrong. i reassured him that i will send it in Pantoprazole

## 2012-12-14 NOTE — Telephone Encounter (Addendum)
Called [facility]/ [facility], refill was completed already by pcp

## 2012-12-29 DIAGNOSIS — D510 Vitamin B12 deficiency anemia due to intrinsic factor deficiency: Secondary | ICD-10-CM | POA: Diagnosis not present

## 2013-01-26 DIAGNOSIS — D510 Vitamin B12 deficiency anemia due to intrinsic factor deficiency: Secondary | ICD-10-CM | POA: Diagnosis not present

## 2013-02-23 DIAGNOSIS — D510 Vitamin B12 deficiency anemia due to intrinsic factor deficiency: Secondary | ICD-10-CM | POA: Diagnosis not present

## 2013-03-23 DIAGNOSIS — D510 Vitamin B12 deficiency anemia due to intrinsic factor deficiency: Secondary | ICD-10-CM | POA: Diagnosis not present

## 2013-04-12 ENCOUNTER — Encounter: Payer: Self-pay | Admitting: Cardiology

## 2013-04-12 ENCOUNTER — Ambulatory Visit (INDEPENDENT_AMBULATORY_CARE_PROVIDER_SITE_OTHER): Payer: Medicare Other | Admitting: Cardiology

## 2013-04-12 VITALS — BP 100/68 | HR 62 | Ht 72.0 in | Wt 198.1 lb

## 2013-04-12 DIAGNOSIS — E785 Hyperlipidemia, unspecified: Secondary | ICD-10-CM | POA: Diagnosis not present

## 2013-04-12 DIAGNOSIS — I498 Other specified cardiac arrhythmias: Secondary | ICD-10-CM | POA: Diagnosis not present

## 2013-04-12 DIAGNOSIS — I471 Supraventricular tachycardia: Secondary | ICD-10-CM

## 2013-04-12 DIAGNOSIS — I2510 Atherosclerotic heart disease of native coronary artery without angina pectoris: Secondary | ICD-10-CM | POA: Diagnosis not present

## 2013-04-12 DIAGNOSIS — I10 Essential (primary) hypertension: Secondary | ICD-10-CM

## 2013-04-12 NOTE — Patient Instructions (Signed)
Continue your current therapy  I will see you in 6 months.   

## 2013-04-16 NOTE — Progress Notes (Signed)
Johnny [name] Date of Birth: 05-17-1941   History of Present Illness: Johnny [name] is seen for followup today. Johnny [name] has a history of supraventricular tachycardia. Since his last visit Johnny [name] only noted one episode of sustained tachycardia in February. This lasted about 3-4 hours and then resolved. Johnny [name] does have an occasional mild pounding sensation. Johnny [name] denies any chest pain or shortness of breath. Johnny [name] is fairly limited by hip and knee pain.  Current Outpatient Prescriptions on File Prior to Visit  Medication Sig Dispense Refill  . aspirin 81 MG tablet Take 81 mg by mouth daily.        [name] atorvastatin (LIPITOR) 10 MG tablet Take 1 tablet (10 mg total) by mouth daily.  90 tablet  3  . clopidogrel (PLAVIX) 75 MG tablet Take 1 tablet (75 mg total) by mouth daily.  90 tablet  3  . Cyanocobalamin (VITAMIN B-12 IJ) Inject as directed as directed.        . hydrOXYzine (ATARAX) 25 MG tablet Take 25 mg by mouth daily.        [name] levothyroxine (SYNTHROID, LEVOTHROID) 25 MCG tablet Take 25 mcg by mouth daily.        [name] lisinopril-hydrochlorothiazide (ZESTORETIC) 20-12.5 MG per tablet Take 1 tablet by mouth 2 (two) times daily.      . meclizine (ANTIVERT) 25 MG tablet Take 25 mg by mouth 2 (two) times daily as needed.       . meloxicam (MOBIC) 15 MG tablet Take 15 mg by mouth daily.       . metoprolol tartrate (LOPRESSOR) 25 MG tablet Take 1 tablet (25 mg total) by mouth 2 (two) times daily.  180 tablet  3  . nitroGLYCERIN (NITROSTAT) 0.4 MG SL tablet Place 1 tablet (0.4 mg total) under the tongue every 5 (five) minutes as needed for chest pain.  1 tablet  0  . pantoprazole (PROTONIX) 40 MG tablet Take 1 tablet (40 mg total) by mouth 2 (two) times daily.  180 tablet  1  . potassium chloride (K-DUR,KLOR-CON) 10 MEQ tablet Take 1 tablet (10 mEq total) by mouth daily.  90 tablet  3   No current facility-administered medications on file prior to visit.    No Known Allergies  Past Medical History  Diagnosis Date  .  Coronary artery disease     LHC 10/15/11: LAD stent patent with mid ectasia, distal LAD 50-60%, mid circumflex with ectasia, EF 55-65%.  . SVT (supraventricular tachycardia)   . Dyslipidemia   . Hypertension   . GERD (gastroesophageal reflux disease)   . Glucose intolerance (pre-diabetes)     Borderline glucose intolerance  . Acromioclavicular joint arthritis   . Rotator cuff tear   . Shortness of breath 10/15/11    "here lately I've been having it on & off any time"  . Post concussive syndrome 2011    "for ~ 6-8 months"  . Hypothyroidism   . Anemia   . B12 deficiency 10/15/11    "take injections q 28 days"    Past Surgical History  Procedure Laterality Date  . Lumbar laminectomy  1970's  . Replacement total knee  12/2007    left  . Colonoscopy w/ endoscopic [place]    . Cardiovascular stress test  10/2010  . Shoulder surgery  02/11/2011    right  . Nasal sinus surgery      x2  . Resection tumor clavicle radical      Open distal clavicle resection  .  Reconstructive shoulder surgery      right  . Joint replacement    . Carpal tunnel release      bilaterally  . Trigger finger release      ? both hands  . Back surgery    . Cardiac catheterization  12/19/08    NORMAL. EF 55%  . Cardiac catheterization  10/15/11  . Coronary angioplasty with stent placement  9/ 2005     History  Smoking status  . Former Smoker -- 1.00 packs/day for 21 years  . Types: Cigarettes  . Quit date: 04/07/1976  Smokeless tobacco  . Never Used    History  Alcohol Use  . Yes    Comment: 10/15/11 "haven't drank in 15-20 years"    Family History  Problem Relation Age of Onset  . Stroke Mother   . Heart attack Father   . Diabetes Father   . Kidney failure Father     Review of Systems: As noted in history of present illness.  All other systems were reviewed and are negative.  Physical Exam: BP 100/68  Pulse 62  Ht 6' (1.829 m)  Wt 198 lb 1.9 oz (89.867 kg)  BMI 26.86 kg/m2 Johnny [name] is a  pleasant white male in no acute distress. HEENT exam is unremarkable. Johnny [name] has no JVD or bruits. Lungs are clear. Cardiac exam reveals a regular rate and rhythm without gallop or murmur. Abdomen is soft and nontender. Johnny [name] has no edema. Pedal pulses are good. Johnny [name] is alert and oriented x3. Cranial nerves II through XII are intact.  LABORATORY DATA:   Assessment / Plan: 1. SVT. Fortunately his episodes have been very infrequent but Johnny [name] does have significant hypotension associated with his episodes. We will continue his current dose of metoprolol. If Johnny [name] has more frequent episodes we Hisle need to consider him for ablative therapy.  2. Coronary disease. Cardiac catheterization December 2012 showed nonobstructive disease. His last stress test in December of 2011 was normal.  3. Dyslipidemia, well controlled.  4. Hypertension, well controlled.

## 2013-04-25 DIAGNOSIS — E039 Hypothyroidism, unspecified: Secondary | ICD-10-CM | POA: Diagnosis not present

## 2013-04-25 DIAGNOSIS — D510 Vitamin B12 deficiency anemia due to intrinsic factor deficiency: Secondary | ICD-10-CM | POA: Diagnosis not present

## 2013-04-25 DIAGNOSIS — Z125 Encounter for screening for malignant neoplasm of prostate: Secondary | ICD-10-CM | POA: Diagnosis not present

## 2013-04-25 DIAGNOSIS — I10 Essential (primary) hypertension: Secondary | ICD-10-CM | POA: Diagnosis not present

## 2013-04-27 DIAGNOSIS — D510 Vitamin B12 deficiency anemia due to intrinsic factor deficiency: Secondary | ICD-10-CM | POA: Diagnosis not present

## 2013-05-02 DIAGNOSIS — Z23 Encounter for immunization: Secondary | ICD-10-CM | POA: Diagnosis not present

## 2013-05-02 DIAGNOSIS — I2510 Atherosclerotic heart disease of native coronary artery without angina pectoris: Secondary | ICD-10-CM | POA: Diagnosis not present

## 2013-05-02 DIAGNOSIS — K219 Gastro-esophageal reflux disease without esophagitis: Secondary | ICD-10-CM | POA: Diagnosis not present

## 2013-05-02 DIAGNOSIS — D510 Vitamin B12 deficiency anemia due to intrinsic factor deficiency: Secondary | ICD-10-CM | POA: Diagnosis not present

## 2013-05-02 DIAGNOSIS — Z1331 Encounter for screening for depression: Secondary | ICD-10-CM | POA: Diagnosis not present

## 2013-05-02 DIAGNOSIS — I10 Essential (primary) hypertension: Secondary | ICD-10-CM | POA: Diagnosis not present

## 2013-05-02 DIAGNOSIS — R7301 Impaired fasting glucose: Secondary | ICD-10-CM | POA: Diagnosis not present

## 2013-05-02 DIAGNOSIS — E039 Hypothyroidism, unspecified: Secondary | ICD-10-CM | POA: Diagnosis not present

## 2013-05-02 DIAGNOSIS — M1990 Unspecified osteoarthritis, unspecified site: Secondary | ICD-10-CM | POA: Diagnosis not present

## 2013-05-02 DIAGNOSIS — Z0000 Encounter for general adult medical examination without abnormal findings: Secondary | ICD-10-CM | POA: Diagnosis not present

## 2013-05-02 DIAGNOSIS — Z125 Encounter for screening for malignant neoplasm of prostate: Secondary | ICD-10-CM | POA: Diagnosis not present

## 2013-05-04 DIAGNOSIS — Z1212 Encounter for screening for malignant neoplasm of rectum: Secondary | ICD-10-CM | POA: Diagnosis not present

## 2013-05-25 DIAGNOSIS — D510 Vitamin B12 deficiency anemia due to intrinsic factor deficiency: Secondary | ICD-10-CM | POA: Diagnosis not present

## 2013-06-21 ENCOUNTER — Other Ambulatory Visit: Payer: Self-pay | Admitting: *Deleted

## 2013-06-21 MED ORDER — PANTOPRAZOLE SODIUM 40 MG PO TBEC: 40.0000 mg | DELAYED_RELEASE_TABLET | Freq: Two times a day (BID) | ORAL | Status: DC

## 2013-06-22 DIAGNOSIS — D510 Vitamin B12 deficiency anemia due to intrinsic factor deficiency: Secondary | ICD-10-CM | POA: Diagnosis not present

## 2013-07-20 DIAGNOSIS — Z23 Encounter for immunization: Secondary | ICD-10-CM | POA: Diagnosis not present

## 2013-07-20 DIAGNOSIS — D510 Vitamin B12 deficiency anemia due to intrinsic factor deficiency: Secondary | ICD-10-CM | POA: Diagnosis not present

## 2013-07-25 ENCOUNTER — Other Ambulatory Visit: Payer: Self-pay | Admitting: *Deleted

## 2013-07-25 MED ORDER — CLOPIDOGREL BISULFATE 75 MG PO TABS: 75.0000 mg | ORAL_TABLET | Freq: Every day | ORAL | Status: DC

## 2013-08-17 DIAGNOSIS — D510 Vitamin B12 deficiency anemia due to intrinsic factor deficiency: Secondary | ICD-10-CM | POA: Diagnosis not present

## 2013-09-14 DIAGNOSIS — D510 Vitamin B12 deficiency anemia due to intrinsic factor deficiency: Secondary | ICD-10-CM | POA: Diagnosis not present

## 2013-09-16 ENCOUNTER — Other Ambulatory Visit: Payer: Self-pay

## 2013-09-16 MED ORDER — ATORVASTATIN CALCIUM 10 MG PO TABS: 10.0000 mg | ORAL_TABLET | Freq: Every day | ORAL | Status: DC

## 2013-10-05 ENCOUNTER — Other Ambulatory Visit: Payer: Self-pay

## 2013-10-05 MED ORDER — METOPROLOL TARTRATE 25 MG PO TABS: 25.0000 mg | ORAL_TABLET | Freq: Two times a day (BID) | ORAL | Status: DC

## 2013-10-05 MED ORDER — POTASSIUM CHLORIDE CRYS ER 10 MEQ PO TBCR: 10.0000 meq | EXTENDED_RELEASE_TABLET | Freq: Every day | ORAL | Status: DC

## 2013-10-12 DIAGNOSIS — D510 Vitamin B12 deficiency anemia due to intrinsic factor deficiency: Secondary | ICD-10-CM | POA: Diagnosis not present

## 2013-10-13 ENCOUNTER — Ambulatory Visit: Payer: Medicare Other | Admitting: Cardiology

## 2013-10-14 ENCOUNTER — Ambulatory Visit: Payer: Medicare Other | Admitting: Nurse Practitioner

## 2013-10-24 ENCOUNTER — Ambulatory Visit (INDEPENDENT_AMBULATORY_CARE_PROVIDER_SITE_OTHER): Payer: Medicare Other | Admitting: Nurse Practitioner

## 2013-10-24 ENCOUNTER — Encounter: Payer: Self-pay | Admitting: Nurse Practitioner

## 2013-10-24 VITALS — BP 140/76 | HR 64 | Ht 72.0 in | Wt 208.8 lb

## 2013-10-24 DIAGNOSIS — R079 Chest pain, unspecified: Secondary | ICD-10-CM

## 2013-10-24 DIAGNOSIS — I259 Chronic ischemic heart disease, unspecified: Secondary | ICD-10-CM

## 2013-10-24 DIAGNOSIS — I498 Other specified cardiac arrhythmias: Secondary | ICD-10-CM

## 2013-10-24 DIAGNOSIS — I471 Supraventricular tachycardia: Secondary | ICD-10-CM

## 2013-10-24 NOTE — Patient Instructions (Signed)
Stay on your current medicines  See Dr. [place] in 6 months  Call the [facility] office at [phone] if you have any questions, problems or concerns.

## 2013-10-24 NOTE — Progress Notes (Signed)
Johnny [name] Date of Birth: February 20, 1941 Medical Record #[id number]  History of Present Illness: Johnny [name] is seen back today for a 6 month check. Seen for Johnny [name]. He has a history of SVT - associated with hypotension. Other issues include HLD, hypothyroidism, HTN and OA.   Last seen here in June - seemed to be doing ok - maintained on his beta blocker but did discuss possible EP referral for ablation due to the hypotension.   Comes in today. Here alone. Has done well since he was last here - reports less frequency of his SVT - only one episode back in October - his BP did drop into the 70s. No chest pain. Not short of breath. Limited more by knee/hip pain and is planning on seeing ortho soon for evaluation. Dr. Jacky [name] does his labs.   Current Outpatient Prescriptions  Medication Sig Dispense Refill  . aspirin 81 MG tablet Take 81 mg by mouth daily.        [name] atorvastatin (LIPITOR) 10 MG tablet Take 1 tablet (10 mg total) by mouth daily.  90 tablet  0  . clopidogrel (PLAVIX) 75 MG tablet Take 1 tablet (75 mg total) by mouth daily.  90 tablet  3  . Cyanocobalamin (VITAMIN B-12 IJ) Inject as directed as directed.        . hydrOXYzine (ATARAX) 25 MG tablet Take 25 mg by mouth daily.        [name] levothyroxine (SYNTHROID, LEVOTHROID) 25 MCG tablet Take 25 mcg by mouth daily.        . meclizine (ANTIVERT) 25 MG tablet Take 25 mg by mouth 2 (two) times daily as needed.       . meloxicam (MOBIC) 15 MG tablet Take 15 mg by mouth daily.       . metoprolol tartrate (LOPRESSOR) 25 MG tablet Take 1 tablet (25 mg total) by mouth 2 (two) times daily.  180 tablet  0  . pantoprazole (PROTONIX) 40 MG tablet Take 1 tablet (40 mg total) by mouth 2 (two) times daily.  180 tablet  1  . potassium chloride (K-DUR,KLOR-CON) 10 MEQ tablet Take 1 tablet (10 mEq total) by mouth daily.  90 tablet  0  . lisinopril-hydrochlorothiazide (ZESTORETIC) 20-12.5 MG per tablet Take 1 tablet by mouth 2 (two) times daily.      .  nitroGLYCERIN (NITROSTAT) 0.4 MG SL tablet Place 1 tablet (0.4 mg total) under the tongue every 5 (five) minutes as needed for chest pain.  1 tablet  0   No current facility-administered medications for this visit.    No Known Allergies  Past Medical History  Diagnosis Date  . Coronary artery disease     LHC 10/15/11: LAD stent patent with mid ectasia, distal LAD 50-60%, mid circumflex with ectasia, EF 55-65%.  . SVT (supraventricular tachycardia)     maintained on beta blocker  . Dyslipidemia   . Hypertension   . GERD (gastroesophageal reflux disease)   . Glucose intolerance (pre-diabetes)     Borderline glucose intolerance  . Acromioclavicular joint arthritis   . Rotator cuff tear   . Shortness of breath 10/15/11    "here lately I've been having it on & off any time"  . Post concussive syndrome 2011    "for ~ 6-8 months"  . Hypothyroidism   . Anemia   . B12 deficiency 10/15/11    "take injections q 28 days"    Past Surgical History  Procedure Laterality Date  .  Lumbar laminectomy  1970's  . Replacement total knee  12/2007    left  . Colonoscopy w/ endoscopic [place]    . Cardiovascular stress test  10/2010  . Shoulder surgery  02/11/2011    right  . Nasal sinus surgery      x2  . Resection tumor clavicle radical      Open distal clavicle resection  . Reconstructive shoulder surgery      right  . Joint replacement    . Carpal tunnel release      bilaterally  . Trigger finger release      ? both hands  . Back surgery    . Cardiac catheterization  12/19/08    NORMAL. EF 55%  . Cardiac catheterization  10/15/11  . Coronary angioplasty with stent placement  9/ 2005     History  Smoking status  . Former Smoker -- 1.00 packs/day for 21 years  . Types: Cigarettes  . Quit date: 04/07/1976  Smokeless tobacco  . Never Used    History  Alcohol Use  . Yes    Comment: 10/15/11 "haven't drank in 15-20 years"    Family History  Problem Relation Age of Onset  .  Stroke Mother   . Heart attack Father   . Diabetes Father   . Kidney failure Father     Review of Systems: The review of systems is per the HPI.  All other systems were reviewed and are negative.  Physical Exam: BP 140/76  Pulse 64  Ht 6' (1.829 m)  Wt 208 lb 12.8 oz (94.711 kg)  BMI 28.31 kg/m2 Patient is very pleasant and in no acute distress. Skin is warm and dry. Color is normal.  HEENT is unremarkable. Normocephalic/atraumatic. PERRL. Sclera are nonicteric. Neck is supple. No masses. No JVD. Lungs are clear. Cardiac exam shows a regular rate and rhythm - heart tones are distant. Abdomen is soft. Extremities are without edema. Gait and ROM are intact. No gross neurologic deficits noted.  LABORATORY DATA: EKG today shows sinus with incomplete RBBB. Tracing is unchanged.   Lab Results  Component Value Date   WBC 7.0 10/16/2011   HGB 15.9 10/16/2011   HCT 45.2 10/16/2011   PLT 179 10/16/2011   GLUCOSE 93 10/16/2011   CHOL 136 10/15/2011   TRIG 236* 10/15/2011   HDL 43 10/15/2011   LDLCALC 46 10/15/2011   ALT 9 10/16/2011   AST 14 10/16/2011   NA 139 10/16/2011   K 3.7 10/16/2011   CL 106 10/16/2011   CREATININE 1.09 10/16/2011   BUN 17 10/16/2011   CO2 23 10/16/2011   TSH 6.034* 10/16/2011   INR 0.96 10/15/2011   HGBA1C 5.7* 10/15/2011     Assessment / Plan: 1. SVT - only one spell in the last 6 months - he notes decreased frequency in general. I have left him on his current regimen. See him back in 6 months.  2. CAD - no symptoms reported.  3. HLD - labs per PCP.  4. HTN  Patient is agreeable to this plan and will call if any problems develop in the interim.   Johnny Macadamia, RN, ANP-C [facility] [address] [name], [name]  [id number]

## 2013-11-07 DIAGNOSIS — D510 Vitamin B12 deficiency anemia due to intrinsic factor deficiency: Secondary | ICD-10-CM | POA: Diagnosis not present

## 2013-11-07 DIAGNOSIS — I10 Essential (primary) hypertension: Secondary | ICD-10-CM | POA: Diagnosis not present

## 2013-11-07 DIAGNOSIS — E039 Hypothyroidism, unspecified: Secondary | ICD-10-CM | POA: Diagnosis not present

## 2013-11-07 DIAGNOSIS — Z6827 Body mass index (BMI) 27.0-27.9, adult: Secondary | ICD-10-CM | POA: Diagnosis not present

## 2013-11-07 DIAGNOSIS — R7301 Impaired fasting glucose: Secondary | ICD-10-CM | POA: Diagnosis not present

## 2013-11-22 DIAGNOSIS — H5000 Unspecified esotropia: Secondary | ICD-10-CM | POA: Diagnosis not present

## 2013-11-22 DIAGNOSIS — H25019 Cortical age-related cataract, unspecified eye: Secondary | ICD-10-CM | POA: Diagnosis not present

## 2013-11-22 DIAGNOSIS — H532 Diplopia: Secondary | ICD-10-CM | POA: Diagnosis not present

## 2013-11-22 DIAGNOSIS — H2510 Age-related nuclear cataract, unspecified eye: Secondary | ICD-10-CM | POA: Diagnosis not present

## 2013-12-07 DIAGNOSIS — D510 Vitamin B12 deficiency anemia due to intrinsic factor deficiency: Secondary | ICD-10-CM | POA: Diagnosis not present

## 2013-12-21 ENCOUNTER — Other Ambulatory Visit: Payer: Self-pay

## 2013-12-21 MED ORDER — METOPROLOL TARTRATE 25 MG PO TABS: 25.0000 mg | ORAL_TABLET | Freq: Two times a day (BID) | ORAL | Status: DC

## 2013-12-21 MED ORDER — ATORVASTATIN CALCIUM 10 MG PO TABS: 10.0000 mg | ORAL_TABLET | Freq: Every day | ORAL | Status: DC

## 2013-12-21 MED ORDER — POTASSIUM CHLORIDE CRYS ER 10 MEQ PO TBCR: 10.0000 meq | EXTENDED_RELEASE_TABLET | Freq: Every day | ORAL | Status: DC

## 2013-12-21 MED ORDER — PANTOPRAZOLE SODIUM 40 MG PO TBEC: 40.0000 mg | DELAYED_RELEASE_TABLET | Freq: Two times a day (BID) | ORAL | Status: DC

## 2014-01-04 DIAGNOSIS — D510 Vitamin B12 deficiency anemia due to intrinsic factor deficiency: Secondary | ICD-10-CM | POA: Diagnosis not present

## 2014-02-01 DIAGNOSIS — D510 Vitamin B12 deficiency anemia due to intrinsic factor deficiency: Secondary | ICD-10-CM | POA: Diagnosis not present

## 2014-03-01 DIAGNOSIS — D510 Vitamin B12 deficiency anemia due to intrinsic factor deficiency: Secondary | ICD-10-CM | POA: Diagnosis not present

## 2014-04-05 DIAGNOSIS — D510 Vitamin B12 deficiency anemia due to intrinsic factor deficiency: Secondary | ICD-10-CM | POA: Diagnosis not present

## 2014-04-17 ENCOUNTER — Ambulatory Visit (INDEPENDENT_AMBULATORY_CARE_PROVIDER_SITE_OTHER): Payer: Medicare Other | Admitting: Cardiology

## 2014-04-17 ENCOUNTER — Encounter: Payer: Self-pay | Admitting: Cardiology

## 2014-04-17 VITALS — BP 122/70 | HR 64 | Ht 72.0 in | Wt 209.0 lb

## 2014-04-17 DIAGNOSIS — I2510 Atherosclerotic heart disease of native coronary artery without angina pectoris: Secondary | ICD-10-CM | POA: Diagnosis not present

## 2014-04-17 DIAGNOSIS — I498 Other specified cardiac arrhythmias: Secondary | ICD-10-CM | POA: Diagnosis not present

## 2014-04-17 DIAGNOSIS — E785 Hyperlipidemia, unspecified: Secondary | ICD-10-CM

## 2014-04-17 DIAGNOSIS — I10 Essential (primary) hypertension: Secondary | ICD-10-CM

## 2014-04-17 DIAGNOSIS — I471 Supraventricular tachycardia: Secondary | ICD-10-CM

## 2014-04-17 NOTE — Patient Instructions (Signed)
Continue your current therapy  I will see you in 6 months.   

## 2014-04-17 NOTE — Progress Notes (Signed)
Brason M Covino Date of Birth: [birth year]   History of Present Illness: Mr. Stofer is seen for followup today. He has a history of supraventricular tachycardia. Since his last visit he only noted one episode of sustained tachycardia. This lasted about 2-3 hours and then resolved. He states it made him feel "goofy". Otherwise no significant palpitations. He denies any chest pain or shortness of breath. He is still limited by hip and knee pain.  Current Outpatient Prescriptions on File Prior to Visit  Medication Sig Dispense Refill  . aspirin 81 MG tablet Take 81 mg by mouth daily.        [name] atorvastatin (LIPITOR) 10 MG tablet Take 1 tablet (10 mg total) by mouth daily.  90 tablet  1  . clopidogrel (PLAVIX) 75 MG tablet Take 1 tablet (75 mg total) by mouth daily.  90 tablet  3  . Cyanocobalamin (VITAMIN B-12 IJ) Inject as directed as directed.        . hydrOXYzine (ATARAX) 25 MG tablet Take 25 mg by mouth daily.        [name] levothyroxine (SYNTHROID, LEVOTHROID) 25 MCG tablet Take 25 mcg by mouth daily.        . meclizine (ANTIVERT) 25 MG tablet Take 25 mg by mouth 2 (two) times daily as needed.       . meloxicam (MOBIC) 15 MG tablet Take 15 mg by mouth daily.       . metoprolol tartrate (LOPRESSOR) 25 MG tablet Take 1 tablet (25 mg total) by mouth 2 (two) times daily.  180 tablet  1  . pantoprazole (PROTONIX) 40 MG tablet Take 1 tablet (40 mg total) by mouth 2 (two) times daily.  180 tablet  1  . potassium chloride (K-DUR,KLOR-CON) 10 MEQ tablet Take 1 tablet (10 mEq total) by mouth daily.  90 tablet  1  . lisinopril-hydrochlorothiazide (ZESTORETIC) 20-12.5 MG per tablet Take 1 tablet by mouth 2 (two) times daily.      . nitroGLYCERIN (NITROSTAT) 0.4 MG SL tablet Place 1 tablet (0.4 mg total) under the tongue every 5 (five) minutes as needed for chest pain.  1 tablet  0   No current facility-administered medications on file prior to visit.    No Known Allergies  Past Medical History  Diagnosis  Date  . Coronary artery disease     LHC 10/15/11: LAD stent patent with mid ectasia, distal LAD 50-60%, mid circumflex with ectasia, EF 55-65%.  . SVT (supraventricular tachycardia)     maintained on beta blocker  . Dyslipidemia   . Hypertension   . GERD (gastroesophageal reflux disease)   . Glucose intolerance (pre-diabetes)     Borderline glucose intolerance  . Acromioclavicular joint arthritis   . Rotator cuff tear   . Shortness of breath 10/15/11    "here lately I've been having it on & off any time"  . Post concussive syndrome 2011    "for ~ 6-8 months"  . Hypothyroidism   . Anemia   . B12 deficiency 10/15/11    "take injections q 28 days"    Past Surgical History  Procedure Laterality Date  . Lumbar laminectomy  1970's  . Replacement total knee  12/2007    left  . Colonoscopy w/ endoscopic [place]    . Cardiovascular stress test  10/2010  . Shoulder surgery  02/11/2011    right  . Nasal sinus surgery      x2  . Resection tumor clavicle radical  Open distal clavicle resection  . Reconstructive shoulder surgery      right  . Joint replacement    . Carpal tunnel release      bilaterally  . Trigger finger release      ? both hands  . Back surgery    . Cardiac catheterization  12/19/08    NORMAL. EF 55%  . Cardiac catheterization  10/15/11  . Coronary angioplasty with stent placement  9/ 2005     History  Smoking status  . Former Smoker -- 1.00 packs/day for 21 years  . Types: Cigarettes  . Quit date: 04/07/1976  Smokeless tobacco  . Never Used    History  Alcohol Use  . Yes    Comment: 10/15/11 "haven't drank in 15-20 years"    Family History  Problem Relation Age of Onset  . Stroke Mother   . Heart attack Father   . Diabetes Father   . Kidney failure Father     Review of Systems: As noted in history of present illness.  All other systems were reviewed and are negative.  Physical Exam: BP 122/70  Pulse 64  Ht 6' (1.829 m)  Wt 209 lb (94.802  kg)  BMI 28.34 kg/m2 He is a pleasant white male in no acute distress. HEENT exam is unremarkable. He has no JVD or bruits. Lungs are clear. Cardiac exam reveals a regular rate and rhythm without gallop or murmur. Abdomen is soft and nontender. He has no edema. Pedal pulses are good. He is alert and oriented x3. Cranial nerves II through XII are intact.  LABORATORY DATA: Due for labs with complete physical in July  Assessment / Plan: 1. SVT. Fortunately his episodes have been very infrequent but he does have significant hypotension associated with his episodes. We will continue his current dose of metoprolol. If he has more frequent episodes we [place] need to consider him for ablative therapy.  2. Coronary disease. Cardiac catheterization December 2012 showed nonobstructive disease. His last stress test in December of 2011 was normal.  3. Dyslipidemia, well controlled. Follow up lab work in July  4. Hypertension, well controlled.

## 2014-05-03 DIAGNOSIS — D510 Vitamin B12 deficiency anemia due to intrinsic factor deficiency: Secondary | ICD-10-CM | POA: Diagnosis not present

## 2014-05-03 DIAGNOSIS — I10 Essential (primary) hypertension: Secondary | ICD-10-CM | POA: Diagnosis not present

## 2014-05-03 DIAGNOSIS — E039 Hypothyroidism, unspecified: Secondary | ICD-10-CM | POA: Diagnosis not present

## 2014-05-03 DIAGNOSIS — Z125 Encounter for screening for malignant neoplasm of prostate: Secondary | ICD-10-CM | POA: Diagnosis not present

## 2014-05-11 DIAGNOSIS — I10 Essential (primary) hypertension: Secondary | ICD-10-CM | POA: Diagnosis not present

## 2014-05-11 DIAGNOSIS — E039 Hypothyroidism, unspecified: Secondary | ICD-10-CM | POA: Diagnosis not present

## 2014-05-11 DIAGNOSIS — M1990 Unspecified osteoarthritis, unspecified site: Secondary | ICD-10-CM | POA: Diagnosis not present

## 2014-05-11 DIAGNOSIS — Z1331 Encounter for screening for depression: Secondary | ICD-10-CM | POA: Diagnosis not present

## 2014-05-11 DIAGNOSIS — Z125 Encounter for screening for malignant neoplasm of prostate: Secondary | ICD-10-CM | POA: Diagnosis not present

## 2014-05-11 DIAGNOSIS — D510 Vitamin B12 deficiency anemia due to intrinsic factor deficiency: Secondary | ICD-10-CM | POA: Diagnosis not present

## 2014-05-11 DIAGNOSIS — K219 Gastro-esophageal reflux disease without esophagitis: Secondary | ICD-10-CM | POA: Diagnosis not present

## 2014-05-11 DIAGNOSIS — Z0000 Encounter for general adult medical examination without abnormal findings: Secondary | ICD-10-CM | POA: Diagnosis not present

## 2014-05-11 DIAGNOSIS — R7301 Impaired fasting glucose: Secondary | ICD-10-CM | POA: Diagnosis not present

## 2014-05-11 DIAGNOSIS — I2510 Atherosclerotic heart disease of native coronary artery without angina pectoris: Secondary | ICD-10-CM | POA: Diagnosis not present

## 2014-05-12 DIAGNOSIS — Z1212 Encounter for screening for malignant neoplasm of rectum: Secondary | ICD-10-CM | POA: Diagnosis not present

## 2014-05-31 DIAGNOSIS — D510 Vitamin B12 deficiency anemia due to intrinsic factor deficiency: Secondary | ICD-10-CM | POA: Diagnosis not present

## 2014-06-15 ENCOUNTER — Other Ambulatory Visit: Payer: Self-pay

## 2014-06-15 ENCOUNTER — Other Ambulatory Visit: Payer: Self-pay | Admitting: *Deleted

## 2014-06-15 MED ORDER — PANTOPRAZOLE SODIUM 40 MG PO TBEC: 40.0000 mg | DELAYED_RELEASE_TABLET | Freq: Two times a day (BID) | ORAL | Status: DC

## 2014-06-15 MED ORDER — ATORVASTATIN CALCIUM 10 MG PO TABS: 10.0000 mg | ORAL_TABLET | Freq: Every day | ORAL | Status: DC

## 2014-06-28 DIAGNOSIS — D510 Vitamin B12 deficiency anemia due to intrinsic factor deficiency: Secondary | ICD-10-CM | POA: Diagnosis not present

## 2014-06-30 ENCOUNTER — Other Ambulatory Visit: Payer: Self-pay

## 2014-06-30 MED ORDER — METOPROLOL TARTRATE 25 MG PO TABS: 25.0000 mg | ORAL_TABLET | Freq: Two times a day (BID) | ORAL | Status: DC

## 2014-06-30 MED ORDER — POTASSIUM CHLORIDE CRYS ER 10 MEQ PO TBCR: 10.0000 meq | EXTENDED_RELEASE_TABLET | Freq: Every day | ORAL | Status: DC

## 2014-07-19 ENCOUNTER — Other Ambulatory Visit: Payer: Self-pay

## 2014-07-19 MED ORDER — CLOPIDOGREL BISULFATE 75 MG PO TABS: 75.0000 mg | ORAL_TABLET | Freq: Every day | ORAL | Status: DC

## 2014-08-02 DIAGNOSIS — D510 Vitamin B12 deficiency anemia due to intrinsic factor deficiency: Secondary | ICD-10-CM | POA: Diagnosis not present

## 2014-08-15 DIAGNOSIS — Z23 Encounter for immunization: Secondary | ICD-10-CM | POA: Diagnosis not present

## 2014-08-30 DIAGNOSIS — D510 Vitamin B12 deficiency anemia due to intrinsic factor deficiency: Secondary | ICD-10-CM | POA: Diagnosis not present

## 2014-09-19 ENCOUNTER — Other Ambulatory Visit: Payer: Self-pay

## 2014-09-19 MED ORDER — ATORVASTATIN CALCIUM 10 MG PO TABS: 10.0000 mg | ORAL_TABLET | Freq: Every day | ORAL | Status: DC

## 2014-10-04 ENCOUNTER — Encounter (HOSPITAL_COMMUNITY): Payer: Self-pay | Admitting: Cardiovascular Disease

## 2014-10-06 ENCOUNTER — Encounter: Payer: Self-pay | Admitting: Cardiology

## 2014-10-06 ENCOUNTER — Ambulatory Visit (INDEPENDENT_AMBULATORY_CARE_PROVIDER_SITE_OTHER): Payer: Medicare Other | Admitting: Cardiology

## 2014-10-06 VITALS — BP 122/70 | HR 74 | Ht 72.0 in | Wt 208.4 lb

## 2014-10-06 DIAGNOSIS — I2510 Atherosclerotic heart disease of native coronary artery without angina pectoris: Secondary | ICD-10-CM

## 2014-10-06 DIAGNOSIS — E785 Hyperlipidemia, unspecified: Secondary | ICD-10-CM | POA: Diagnosis not present

## 2014-10-06 DIAGNOSIS — I471 Supraventricular tachycardia: Secondary | ICD-10-CM | POA: Diagnosis not present

## 2014-10-06 DIAGNOSIS — I10 Essential (primary) hypertension: Secondary | ICD-10-CM

## 2014-10-06 NOTE — Progress Notes (Signed)
Johnny [name] Date of Birth: 22-Oct-1941   History of Present Illness: Johnny [name] is seen for followup of SVT and CAD. He has a history of supraventricular tachycardia. Since his last visit he has noted tachycardia only twice. These episodes did not last.  Otherwise no significant palpitations. He denies any chest pain or shortness of breath.   Current Outpatient Prescriptions on File Prior to Visit  Medication Sig Dispense Refill  . aspirin 81 MG tablet Take 81 mg by mouth daily.      [name] atorvastatin (LIPITOR) 10 MG tablet Take 1 tablet (10 mg total) by mouth daily. 90 tablet 0  . clopidogrel (PLAVIX) 75 MG tablet Take 1 tablet (75 mg total) by mouth daily. 90 tablet 1  . Cyanocobalamin (VITAMIN B-12 IJ) Inject as directed as directed.      . hydrOXYzine (ATARAX) 25 MG tablet Take 25 mg by mouth daily.      [name] levothyroxine (SYNTHROID, LEVOTHROID) 25 MCG tablet Take 25 mcg by mouth daily.      [name] lisinopril-hydrochlorothiazide (ZESTORETIC) 20-12.5 MG per tablet Take 1 tablet by mouth 2 (two) times daily.    . meclizine (ANTIVERT) 25 MG tablet Take 25 mg by mouth 2 (two) times daily as needed.     . meloxicam (MOBIC) 15 MG tablet Take 15 mg by mouth daily.     . metoprolol tartrate (LOPRESSOR) 25 MG tablet Take 1 tablet (25 mg total) by mouth 2 (two) times daily. 180 tablet 1  . nitroGLYCERIN (NITROSTAT) 0.4 MG SL tablet Place 1 tablet (0.4 mg total) under the tongue every 5 (five) minutes as needed for chest pain. 1 tablet 0  . pantoprazole (PROTONIX) 40 MG tablet Take 1 tablet (40 mg total) by mouth 2 (two) times daily. 180 tablet 1  . potassium chloride (K-DUR,KLOR-CON) 10 MEQ tablet Take 1 tablet (10 mEq total) by mouth daily. 90 tablet 1   No current facility-administered medications on file prior to visit.    No Known Allergies  Past Medical History  Diagnosis Date  . Coronary artery disease     LHC 10/15/11: LAD stent patent with mid ectasia, distal LAD 50-60%, mid circumflex with  ectasia, EF 55-65%.  . SVT (supraventricular tachycardia)     maintained on beta blocker  . Dyslipidemia   . Hypertension   . GERD (gastroesophageal reflux disease)   . Glucose intolerance (pre-diabetes)     Borderline glucose intolerance  . Acromioclavicular joint arthritis   . Rotator cuff tear   . Shortness of breath 10/15/11    "here lately I've been having it on & off any time"  . Post concussive syndrome 2011    "for ~ 6-8 months"  . Hypothyroidism   . Anemia   . B12 deficiency 10/15/11    "take injections q 28 days"    Past Surgical History  Procedure Laterality Date  . Lumbar laminectomy  1970's  . Replacement total knee  12/2007    left  . Colonoscopy w/ endoscopic [place]    . Cardiovascular stress test  10/2010  . Shoulder surgery  02/11/2011    right  . Nasal sinus surgery      x2  . Resection tumor clavicle radical      Open distal clavicle resection  . Reconstructive shoulder surgery      right  . Joint replacement    . Carpal tunnel release      bilaterally  . Trigger finger release      ?  both hands  . Back surgery    . Cardiac catheterization  12/19/08    NORMAL. EF 55%  . Cardiac catheterization  10/15/11  . Coronary angioplasty with stent placement  9/ 2005   . Left heart catheterization with coronary angiogram N/A 10/15/2011    Procedure: LEFT HEART CATHETERIZATION WITH CORONARY ANGIOGRAM;  Surgeon: Sherren Mocha, MD;  Location: [facility] CATH LAB;  Service: Cardiovascular;  Laterality: N/A;    History  Smoking status  . Former Smoker -- 1.00 packs/day for 21 years  . Types: Cigarettes  . Quit date: 04/07/1976  Smokeless tobacco  . Never Used    History  Alcohol Use  . Yes    Comment: 10/15/11 "haven't drank in 15-20 years"    Family History  Problem Relation Age of Onset  . Stroke Mother   . Heart attack Father   . Diabetes Father   . Kidney failure Father     Review of Systems: As noted in history of present illness.  All other systems  were reviewed and are negative.  Physical Exam: BP 122/70 mmHg  Pulse 74  Ht 6' (1.829 m)  Wt 208 lb 6.4 oz (94.53 kg)  BMI 28.26 kg/m2 He is a pleasant white male in no acute distress. HEENT exam is unremarkable. He has no JVD or bruits. Lungs are clear. Cardiac exam reveals a regular rate and rhythm without gallop or murmur. Abdomen is soft and nontender. He has no edema. Pedal pulses are good. He is alert and oriented x3. Cranial nerves II through XII are intact.  LABORATORY DATA: Ecg: NSR with rate 74 bpm. Nonspecific IVCD.  Assessment / Plan: 1. SVT. Fortunately his episodes have been very infrequent but he does have significant hypotension associated with his episodes. We will continue his current dose of metoprolol. Follow up in 6 months.  2. Coronary disease. Cardiac catheterization December 2012 showed nonobstructive disease. His last stress test in December of 2011 was normal.  3. Dyslipidemia, well controlled.   4. Hypertension, well controlled.

## 2014-10-06 NOTE — Patient Instructions (Signed)
Continue your current therapy  I will see you in 6 months.   

## 2014-10-26 DIAGNOSIS — D510 Vitamin B12 deficiency anemia due to intrinsic factor deficiency: Secondary | ICD-10-CM | POA: Diagnosis not present

## 2014-11-08 DIAGNOSIS — I10 Essential (primary) hypertension: Secondary | ICD-10-CM | POA: Diagnosis not present

## 2014-11-08 DIAGNOSIS — D510 Vitamin B12 deficiency anemia due to intrinsic factor deficiency: Secondary | ICD-10-CM | POA: Diagnosis not present

## 2014-11-08 DIAGNOSIS — R7301 Impaired fasting glucose: Secondary | ICD-10-CM | POA: Diagnosis not present

## 2014-11-08 DIAGNOSIS — M1990 Unspecified osteoarthritis, unspecified site: Secondary | ICD-10-CM | POA: Diagnosis not present

## 2014-11-08 DIAGNOSIS — Z1389 Encounter for screening for other disorder: Secondary | ICD-10-CM | POA: Diagnosis not present

## 2014-11-08 DIAGNOSIS — Z6827 Body mass index (BMI) 27.0-27.9, adult: Secondary | ICD-10-CM | POA: Diagnosis not present

## 2014-11-22 DIAGNOSIS — D510 Vitamin B12 deficiency anemia due to intrinsic factor deficiency: Secondary | ICD-10-CM | POA: Diagnosis not present

## 2014-11-24 DIAGNOSIS — H2513 Age-related nuclear cataract, bilateral: Secondary | ICD-10-CM | POA: Diagnosis not present

## 2014-11-24 DIAGNOSIS — H5000 Unspecified esotropia: Secondary | ICD-10-CM | POA: Diagnosis not present

## 2014-11-24 DIAGNOSIS — H5203 Hypermetropia, bilateral: Secondary | ICD-10-CM | POA: Diagnosis not present

## 2014-11-24 DIAGNOSIS — H1789 Other corneal scars and opacities: Secondary | ICD-10-CM | POA: Diagnosis not present

## 2014-12-11 ENCOUNTER — Other Ambulatory Visit: Payer: Self-pay | Admitting: Nurse Practitioner

## 2014-12-11 MED ORDER — PANTOPRAZOLE SODIUM 40 MG PO TBEC: 40.0000 mg | DELAYED_RELEASE_TABLET | Freq: Two times a day (BID) | ORAL | Status: DC

## 2014-12-20 ENCOUNTER — Other Ambulatory Visit: Payer: Self-pay

## 2014-12-20 MED ORDER — ATORVASTATIN CALCIUM 10 MG PO TABS: 10.0000 mg | ORAL_TABLET | Freq: Every day | ORAL | Status: DC

## 2014-12-21 ENCOUNTER — Other Ambulatory Visit: Payer: Self-pay | Admitting: Cardiology

## 2014-12-21 MED ORDER — METOPROLOL TARTRATE 25 MG PO TABS
25.0000 mg | ORAL_TABLET | Freq: Two times a day (BID) | ORAL | Status: DC
Start: 2014-12-21 — End: 2015-06-25

## 2014-12-21 MED ORDER — POTASSIUM CHLORIDE CRYS ER 10 MEQ PO TBCR: 10.0000 meq | EXTENDED_RELEASE_TABLET | Freq: Every day | ORAL | Status: DC

## 2014-12-27 DIAGNOSIS — D510 Vitamin B12 deficiency anemia due to intrinsic factor deficiency: Secondary | ICD-10-CM | POA: Diagnosis not present

## 2015-01-15 ENCOUNTER — Other Ambulatory Visit: Payer: Self-pay | Admitting: Cardiology

## 2015-01-25 DIAGNOSIS — D510 Vitamin B12 deficiency anemia due to intrinsic factor deficiency: Secondary | ICD-10-CM | POA: Diagnosis not present

## 2015-02-12 ENCOUNTER — Other Ambulatory Visit: Payer: Self-pay | Admitting: Cardiology

## 2015-02-21 DIAGNOSIS — D510 Vitamin B12 deficiency anemia due to intrinsic factor deficiency: Secondary | ICD-10-CM | POA: Diagnosis not present

## 2015-03-21 DIAGNOSIS — D510 Vitamin B12 deficiency anemia due to intrinsic factor deficiency: Secondary | ICD-10-CM | POA: Diagnosis not present

## 2015-04-13 ENCOUNTER — Encounter: Payer: Self-pay | Admitting: Cardiology

## 2015-04-13 ENCOUNTER — Ambulatory Visit (INDEPENDENT_AMBULATORY_CARE_PROVIDER_SITE_OTHER): Payer: Medicare Other | Admitting: Cardiology

## 2015-04-13 VITALS — BP 110/66 | HR 76 | Ht 72.0 in | Wt 203.6 lb

## 2015-04-13 DIAGNOSIS — E785 Hyperlipidemia, unspecified: Secondary | ICD-10-CM | POA: Diagnosis not present

## 2015-04-13 DIAGNOSIS — I10 Essential (primary) hypertension: Secondary | ICD-10-CM | POA: Diagnosis not present

## 2015-04-13 DIAGNOSIS — I2510 Atherosclerotic heart disease of native coronary artery without angina pectoris: Secondary | ICD-10-CM | POA: Diagnosis not present

## 2015-04-13 DIAGNOSIS — I471 Supraventricular tachycardia: Secondary | ICD-10-CM | POA: Diagnosis not present

## 2015-04-13 NOTE — Patient Instructions (Signed)
Continue your current therapy  I will see you in one year   

## 2015-04-13 NOTE — Progress Notes (Signed)
Johnny [name] Date of Birth: 1941-07-04   History of Present Illness: Johnny [name] is seen for followup of SVT and CAD. He has a history of supraventricular tachycardia. He is s/p stenting of the proximal LAD in 2005 with a Cypher stent. He is on chronic DAPT. He had repeat caths in 2010 and Dec. 2012 with nonobstructive disease. He denies any chest pain, SOB, or palpitations. Not able to get out much due to caring for an invalid wife. Does note a vibration sensation in left pectoral area when mowing grass.  Current Outpatient Prescriptions on File Prior to Visit  Medication Sig Dispense Refill  . aspirin 81 MG tablet Take 81 mg by mouth daily.      [name] atorvastatin (LIPITOR) 10 MG tablet TAKE 1 TABLET BY MOUTH DAILY 60 tablet 6  . clopidogrel (PLAVIX) 75 MG tablet TAKE 1 TABLET BY MOUTH DAILY 90 tablet 0  . Cyanocobalamin (VITAMIN B-12 IJ) Inject as directed as directed.      . hydrOXYzine (ATARAX) 25 MG tablet Take 25 mg by mouth daily.      [name] levothyroxine (SYNTHROID, LEVOTHROID) 25 MCG tablet Take 25 mcg by mouth daily.      [name] lisinopril-hydrochlorothiazide (ZESTORETIC) 20-12.5 MG per tablet Take 1 tablet by mouth 2 (two) times daily.    . meclizine (ANTIVERT) 25 MG tablet Take 25 mg by mouth 2 (two) times daily as needed.     . meloxicam (MOBIC) 15 MG tablet Take 15 mg by mouth daily.     . metoprolol tartrate (LOPRESSOR) 25 MG tablet Take 1 tablet (25 mg total) by mouth 2 (two) times daily. 180 tablet 1  . nitroGLYCERIN (NITROSTAT) 0.4 MG SL tablet Place 1 tablet (0.4 mg total) under the tongue every 5 (five) minutes as needed for chest pain. 1 tablet 0  . pantoprazole (PROTONIX) 40 MG tablet Take 1 tablet (40 mg total) by mouth 2 (two) times daily. 180 tablet 3  . potassium chloride (K-DUR,KLOR-CON) 10 MEQ tablet Take 1 tablet (10 mEq total) by mouth daily. 90 tablet 1   No current facility-administered medications on file prior to visit.    No Known Allergies  Past Medical History   Diagnosis Date  . Coronary artery disease     LHC 10/15/11: LAD stent patent with mid ectasia, distal LAD 50-60%, mid circumflex with ectasia, EF 55-65%.  . SVT (supraventricular tachycardia)     maintained on beta blocker  . Dyslipidemia   . Hypertension   . GERD (gastroesophageal reflux disease)   . Glucose intolerance (pre-diabetes)     Borderline glucose intolerance  . Acromioclavicular joint arthritis   . Rotator cuff tear   . Shortness of breath 10/15/11    "here lately I've been having it on & off any time"  . Post concussive syndrome 2011    "for ~ 6-8 months"  . Hypothyroidism   . Anemia   . B12 deficiency 10/15/11    "take injections q 28 days"    Past Surgical History  Procedure Laterality Date  . Lumbar laminectomy  1970's  . Replacement total knee  12/2007    left  . Colonoscopy w/ endoscopic [place]    . Cardiovascular stress test  10/2010  . Shoulder surgery  02/11/2011    right  . Nasal sinus surgery      x2  . Resection tumor clavicle radical      Open distal clavicle resection  . Reconstructive shoulder surgery  right  . Joint replacement    . Carpal tunnel release      bilaterally  . Trigger finger release      ? both hands  . Back surgery    . Cardiac catheterization  12/19/08    NORMAL. EF 55%  . Cardiac catheterization  10/15/11  . Coronary angioplasty with stent placement  9/ 2005   . Left heart catheterization with coronary angiogram N/A 10/15/2011    Procedure: LEFT HEART CATHETERIZATION WITH CORONARY ANGIOGRAM;  Surgeon: Sherren Mocha, MD;  Location: [facility] CATH LAB;  Service: Cardiovascular;  Laterality: N/A;    History  Smoking status  . Former Smoker -- 1.00 packs/day for 21 years  . Types: Cigarettes  . Quit date: 04/07/1976  Smokeless tobacco  . Never Used    History  Alcohol Use  . Yes    Comment: 10/15/11 "haven't drank in 15-20 years"    Family History  Problem Relation Age of Onset  . Stroke Mother   . Heart attack  Father   . Diabetes Father   . Kidney failure Father     Review of Systems: As noted in history of present illness.  All other systems were reviewed and are negative.  Physical Exam: BP 110/66 mmHg  Pulse 76  Ht 6' (1.829 m)  Wt 92.352 kg (203 lb 9.6 oz)  BMI 27.61 kg/m2 He is a pleasant white male in no acute distress. HEENT exam is unremarkable. He has no JVD or bruits. Lungs are clear. Cardiac exam reveals a regular rate and rhythm without gallop or murmur. Abdomen is soft and nontender. He has no edema. Pedal pulses are good. He is alert and oriented x3. Cranial nerves II through XII are intact.  LABORATORY DATA:   Assessment / Plan: 1. SVT. Fortunately his episodes have been very infrequent but he does have significant hypotension associated with his episodes. We will continue his current dose of metoprolol.   2. Coronary disease. S/p remote stenting of the proximal LAD with Cypher stent in 2005. Cardiac catheterization December 2012 showed nonobstructive disease. His last stress test in December of 2011 was normal.  3. Dyslipidemia, well controlled.   4. Hypertension, well controlled.  I will follow up in one year

## 2015-04-16 ENCOUNTER — Other Ambulatory Visit: Payer: Self-pay | Admitting: Cardiology

## 2015-04-16 NOTE — Telephone Encounter (Signed)
E sent to pharmacy 

## 2015-04-25 DIAGNOSIS — D510 Vitamin B12 deficiency anemia due to intrinsic factor deficiency: Secondary | ICD-10-CM | POA: Diagnosis not present

## 2015-05-07 DIAGNOSIS — Z125 Encounter for screening for malignant neoplasm of prostate: Secondary | ICD-10-CM | POA: Diagnosis not present

## 2015-05-07 DIAGNOSIS — D510 Vitamin B12 deficiency anemia due to intrinsic factor deficiency: Secondary | ICD-10-CM | POA: Diagnosis not present

## 2015-05-07 DIAGNOSIS — E039 Hypothyroidism, unspecified: Secondary | ICD-10-CM | POA: Diagnosis not present

## 2015-05-07 DIAGNOSIS — I10 Essential (primary) hypertension: Secondary | ICD-10-CM | POA: Diagnosis not present

## 2015-05-07 DIAGNOSIS — R7301 Impaired fasting glucose: Secondary | ICD-10-CM | POA: Diagnosis not present

## 2015-05-11 DIAGNOSIS — M25512 Pain in left shoulder: Secondary | ICD-10-CM | POA: Diagnosis not present

## 2015-05-15 DIAGNOSIS — Z23 Encounter for immunization: Secondary | ICD-10-CM | POA: Diagnosis not present

## 2015-05-15 DIAGNOSIS — I2510 Atherosclerotic heart disease of native coronary artery without angina pectoris: Secondary | ICD-10-CM | POA: Diagnosis not present

## 2015-05-15 DIAGNOSIS — Z0000 Encounter for general adult medical examination without abnormal findings: Secondary | ICD-10-CM | POA: Diagnosis not present

## 2015-05-15 DIAGNOSIS — Z6827 Body mass index (BMI) 27.0-27.9, adult: Secondary | ICD-10-CM | POA: Diagnosis not present

## 2015-05-15 DIAGNOSIS — M1990 Unspecified osteoarthritis, unspecified site: Secondary | ICD-10-CM | POA: Diagnosis not present

## 2015-05-15 DIAGNOSIS — N183 Chronic kidney disease, stage 3 (moderate): Secondary | ICD-10-CM | POA: Diagnosis not present

## 2015-05-15 DIAGNOSIS — Z1212 Encounter for screening for malignant neoplasm of rectum: Secondary | ICD-10-CM | POA: Diagnosis not present

## 2015-05-15 DIAGNOSIS — E039 Hypothyroidism, unspecified: Secondary | ICD-10-CM | POA: Diagnosis not present

## 2015-05-15 DIAGNOSIS — I129 Hypertensive chronic kidney disease with stage 1 through stage 4 chronic kidney disease, or unspecified chronic kidney disease: Secondary | ICD-10-CM | POA: Diagnosis not present

## 2015-05-15 DIAGNOSIS — D510 Vitamin B12 deficiency anemia due to intrinsic factor deficiency: Secondary | ICD-10-CM | POA: Diagnosis not present

## 2015-05-15 DIAGNOSIS — I10 Essential (primary) hypertension: Secondary | ICD-10-CM | POA: Diagnosis not present

## 2015-05-18 ENCOUNTER — Encounter: Payer: Self-pay | Admitting: Internal Medicine

## 2015-05-24 DIAGNOSIS — M25561 Pain in right knee: Secondary | ICD-10-CM | POA: Diagnosis not present

## 2015-05-24 DIAGNOSIS — M1990 Unspecified osteoarthritis, unspecified site: Secondary | ICD-10-CM | POA: Diagnosis not present

## 2015-05-24 DIAGNOSIS — Z6826 Body mass index (BMI) 26.0-26.9, adult: Secondary | ICD-10-CM | POA: Diagnosis not present

## 2015-05-24 DIAGNOSIS — N183 Chronic kidney disease, stage 3 (moderate): Secondary | ICD-10-CM | POA: Diagnosis not present

## 2015-05-24 DIAGNOSIS — M5137 Other intervertebral disc degeneration, lumbosacral region: Secondary | ICD-10-CM | POA: Diagnosis not present

## 2015-05-24 DIAGNOSIS — M1611 Unilateral primary osteoarthritis, right hip: Secondary | ICD-10-CM | POA: Diagnosis not present

## 2015-05-24 DIAGNOSIS — M1711 Unilateral primary osteoarthritis, right knee: Secondary | ICD-10-CM | POA: Diagnosis not present

## 2015-05-24 DIAGNOSIS — I129 Hypertensive chronic kidney disease with stage 1 through stage 4 chronic kidney disease, or unspecified chronic kidney disease: Secondary | ICD-10-CM | POA: Diagnosis not present

## 2015-05-24 DIAGNOSIS — M25551 Pain in right hip: Secondary | ICD-10-CM | POA: Diagnosis not present

## 2015-05-24 DIAGNOSIS — M5136 Other intervertebral disc degeneration, lumbar region: Secondary | ICD-10-CM | POA: Diagnosis not present

## 2015-05-31 DIAGNOSIS — M25512 Pain in left shoulder: Secondary | ICD-10-CM | POA: Diagnosis not present

## 2015-06-04 DIAGNOSIS — M25561 Pain in right knee: Secondary | ICD-10-CM | POA: Diagnosis not present

## 2015-06-04 DIAGNOSIS — M75112 Incomplete rotator cuff tear or rupture of left shoulder, not specified as traumatic: Secondary | ICD-10-CM | POA: Diagnosis not present

## 2015-06-13 DIAGNOSIS — D510 Vitamin B12 deficiency anemia due to intrinsic factor deficiency: Secondary | ICD-10-CM | POA: Diagnosis not present

## 2015-06-25 ENCOUNTER — Other Ambulatory Visit: Payer: Self-pay | Admitting: Cardiology

## 2015-06-25 NOTE — Telephone Encounter (Signed)
REFILL 

## 2015-07-11 DIAGNOSIS — D510 Vitamin B12 deficiency anemia due to intrinsic factor deficiency: Secondary | ICD-10-CM | POA: Diagnosis not present

## 2015-08-14 DIAGNOSIS — D510 Vitamin B12 deficiency anemia due to intrinsic factor deficiency: Secondary | ICD-10-CM | POA: Diagnosis not present

## 2015-08-25 DIAGNOSIS — Z23 Encounter for immunization: Secondary | ICD-10-CM | POA: Diagnosis not present

## 2015-09-05 DIAGNOSIS — D510 Vitamin B12 deficiency anemia due to intrinsic factor deficiency: Secondary | ICD-10-CM | POA: Diagnosis not present

## 2015-10-03 DIAGNOSIS — D510 Vitamin B12 deficiency anemia due to intrinsic factor deficiency: Secondary | ICD-10-CM | POA: Diagnosis not present

## 2015-10-31 ENCOUNTER — Telehealth: Payer: Self-pay | Admitting: Cardiology

## 2015-10-31 DIAGNOSIS — D510 Vitamin B12 deficiency anemia due to intrinsic factor deficiency: Secondary | ICD-10-CM | POA: Diagnosis not present

## 2015-11-01 NOTE — Telephone Encounter (Signed)
Close encounter 

## 2015-11-12 DIAGNOSIS — I129 Hypertensive chronic kidney disease with stage 1 through stage 4 chronic kidney disease, or unspecified chronic kidney disease: Secondary | ICD-10-CM | POA: Diagnosis not present

## 2015-11-12 DIAGNOSIS — I10 Essential (primary) hypertension: Secondary | ICD-10-CM | POA: Diagnosis not present

## 2015-11-12 DIAGNOSIS — K219 Gastro-esophageal reflux disease without esophagitis: Secondary | ICD-10-CM | POA: Diagnosis not present

## 2015-11-12 DIAGNOSIS — Z6826 Body mass index (BMI) 26.0-26.9, adult: Secondary | ICD-10-CM | POA: Diagnosis not present

## 2015-11-12 DIAGNOSIS — D510 Vitamin B12 deficiency anemia due to intrinsic factor deficiency: Secondary | ICD-10-CM | POA: Diagnosis not present

## 2015-11-12 DIAGNOSIS — E039 Hypothyroidism, unspecified: Secondary | ICD-10-CM | POA: Diagnosis not present

## 2015-11-12 DIAGNOSIS — R7302 Impaired glucose tolerance (oral): Secondary | ICD-10-CM | POA: Diagnosis not present

## 2015-11-12 DIAGNOSIS — Z1389 Encounter for screening for other disorder: Secondary | ICD-10-CM | POA: Diagnosis not present

## 2015-11-12 DIAGNOSIS — I2510 Atherosclerotic heart disease of native coronary artery without angina pectoris: Secondary | ICD-10-CM | POA: Diagnosis not present

## 2015-11-12 DIAGNOSIS — N183 Chronic kidney disease, stage 3 (moderate): Secondary | ICD-10-CM | POA: Diagnosis not present

## 2015-11-12 DIAGNOSIS — M25561 Pain in right knee: Secondary | ICD-10-CM | POA: Diagnosis not present

## 2015-11-12 DIAGNOSIS — M1990 Unspecified osteoarthritis, unspecified site: Secondary | ICD-10-CM | POA: Diagnosis not present

## 2015-11-28 DIAGNOSIS — D510 Vitamin B12 deficiency anemia due to intrinsic factor deficiency: Secondary | ICD-10-CM | POA: Diagnosis not present

## 2015-11-30 DIAGNOSIS — H2513 Age-related nuclear cataract, bilateral: Secondary | ICD-10-CM | POA: Diagnosis not present

## 2015-11-30 DIAGNOSIS — H5203 Hypermetropia, bilateral: Secondary | ICD-10-CM | POA: Diagnosis not present

## 2015-12-10 ENCOUNTER — Other Ambulatory Visit: Payer: Self-pay | Admitting: Nurse Practitioner

## 2015-12-11 NOTE — Telephone Encounter (Signed)
Rx(s) sent to pharmacy electronically.  

## 2016-01-01 DIAGNOSIS — D510 Vitamin B12 deficiency anemia due to intrinsic factor deficiency: Secondary | ICD-10-CM | POA: Diagnosis not present

## 2016-01-03 ENCOUNTER — Encounter: Payer: Self-pay | Admitting: Internal Medicine

## 2016-01-24 DIAGNOSIS — D510 Vitamin B12 deficiency anemia due to intrinsic factor deficiency: Secondary | ICD-10-CM | POA: Diagnosis not present

## 2016-03-05 DIAGNOSIS — D510 Vitamin B12 deficiency anemia due to intrinsic factor deficiency: Secondary | ICD-10-CM | POA: Diagnosis not present

## 2016-03-08 DIAGNOSIS — S61203A Unspecified open wound of left middle finger without damage to nail, initial encounter: Secondary | ICD-10-CM | POA: Diagnosis not present

## 2016-03-08 DIAGNOSIS — S0993XA Unspecified injury of face, initial encounter: Secondary | ICD-10-CM | POA: Diagnosis not present

## 2016-03-08 DIAGNOSIS — S61205A Unspecified open wound of left ring finger without damage to nail, initial encounter: Secondary | ICD-10-CM | POA: Diagnosis not present

## 2016-04-03 DIAGNOSIS — D510 Vitamin B12 deficiency anemia due to intrinsic factor deficiency: Secondary | ICD-10-CM | POA: Diagnosis not present

## 2016-04-07 ENCOUNTER — Other Ambulatory Visit: Payer: Self-pay

## 2016-04-07 MED ORDER — CLOPIDOGREL BISULFATE 75 MG PO TABS: 75.0000 mg | ORAL_TABLET | Freq: Every day | ORAL | Status: DC

## 2016-04-15 ENCOUNTER — Other Ambulatory Visit: Payer: Self-pay | Admitting: *Deleted

## 2016-04-15 MED ORDER — ATORVASTATIN CALCIUM 10 MG PO TABS: 10.0000 mg | ORAL_TABLET | Freq: Every day | ORAL | Status: DC

## 2016-04-21 ENCOUNTER — Other Ambulatory Visit: Payer: Self-pay | Admitting: *Deleted

## 2016-04-21 MED ORDER — ATORVASTATIN CALCIUM 10 MG PO TABS: 10.0000 mg | ORAL_TABLET | Freq: Every day | ORAL | Status: DC

## 2016-04-21 NOTE — Telephone Encounter (Signed)
Patient stated that [facility] pharmacy will not transfer his rx to [facility].

## 2016-04-21 NOTE — Telephone Encounter (Signed)
Rx(s) sent to pharmacy electronically.  

## 2016-04-23 DIAGNOSIS — D510 Vitamin B12 deficiency anemia due to intrinsic factor deficiency: Secondary | ICD-10-CM | POA: Diagnosis not present

## 2016-05-14 DIAGNOSIS — R7301 Impaired fasting glucose: Secondary | ICD-10-CM | POA: Diagnosis not present

## 2016-05-14 DIAGNOSIS — Z0000 Encounter for general adult medical examination without abnormal findings: Secondary | ICD-10-CM | POA: Diagnosis not present

## 2016-05-14 DIAGNOSIS — Z125 Encounter for screening for malignant neoplasm of prostate: Secondary | ICD-10-CM | POA: Diagnosis not present

## 2016-05-14 DIAGNOSIS — E038 Other specified hypothyroidism: Secondary | ICD-10-CM | POA: Diagnosis not present

## 2016-05-14 DIAGNOSIS — D510 Vitamin B12 deficiency anemia due to intrinsic factor deficiency: Secondary | ICD-10-CM | POA: Diagnosis not present

## 2016-05-14 DIAGNOSIS — I10 Essential (primary) hypertension: Secondary | ICD-10-CM | POA: Diagnosis not present

## 2016-05-21 DIAGNOSIS — I129 Hypertensive chronic kidney disease with stage 1 through stage 4 chronic kidney disease, or unspecified chronic kidney disease: Secondary | ICD-10-CM | POA: Diagnosis not present

## 2016-05-21 DIAGNOSIS — K219 Gastro-esophageal reflux disease without esophagitis: Secondary | ICD-10-CM | POA: Diagnosis not present

## 2016-05-21 DIAGNOSIS — N183 Chronic kidney disease, stage 3 (moderate): Secondary | ICD-10-CM | POA: Diagnosis not present

## 2016-05-21 DIAGNOSIS — D510 Vitamin B12 deficiency anemia due to intrinsic factor deficiency: Secondary | ICD-10-CM | POA: Diagnosis not present

## 2016-05-21 DIAGNOSIS — Z0000 Encounter for general adult medical examination without abnormal findings: Secondary | ICD-10-CM | POA: Diagnosis not present

## 2016-05-21 DIAGNOSIS — M1990 Unspecified osteoarthritis, unspecified site: Secondary | ICD-10-CM | POA: Diagnosis not present

## 2016-05-21 DIAGNOSIS — Z6826 Body mass index (BMI) 26.0-26.9, adult: Secondary | ICD-10-CM | POA: Diagnosis not present

## 2016-05-21 DIAGNOSIS — E038 Other specified hypothyroidism: Secondary | ICD-10-CM | POA: Diagnosis not present

## 2016-05-21 DIAGNOSIS — R7301 Impaired fasting glucose: Secondary | ICD-10-CM | POA: Diagnosis not present

## 2016-05-21 DIAGNOSIS — I2510 Atherosclerotic heart disease of native coronary artery without angina pectoris: Secondary | ICD-10-CM | POA: Diagnosis not present

## 2016-05-21 DIAGNOSIS — I10 Essential (primary) hypertension: Secondary | ICD-10-CM | POA: Diagnosis not present

## 2016-05-29 NOTE — Progress Notes (Signed)
Johnny [name] Date of Birth: April 16, 1941   History of Present Illness: Johnny [name] is seen for followup of SVT and CAD. He has a history of supraventricular tachycardia. He is s/p stenting of the proximal LAD in 2005 with a Cypher stent. He is on chronic DAPT. He had repeat caths in 2010 and Dec. 2012 with nonobstructive disease. He denies any chest pain, SOB, or palpitations. Not able to get out much due to caring for an invalid wife. He has chronic arthritis in his knees that limits his activity. Takes Mobic for this.  Current Outpatient Prescriptions on File Prior to Visit  Medication Sig Dispense Refill  . aspirin 81 MG tablet Take 81 mg by mouth daily.      [name] atorvastatin (LIPITOR) 10 MG tablet Take 1 tablet (10 mg total) by mouth daily. 90 tablet 2  . clopidogrel (PLAVIX) 75 MG tablet Take 1 tablet (75 mg total) by mouth daily. 30 tablet 2  . Cyanocobalamin (VITAMIN B-12 IJ) Inject as directed as directed.      . hydrOXYzine (ATARAX) 25 MG tablet Take 25 mg by mouth daily.      [name] levothyroxine (SYNTHROID, LEVOTHROID) 25 MCG tablet Take 25 mcg by mouth daily.      [name] lisinopril-hydrochlorothiazide (ZESTORETIC) 20-12.5 MG per tablet Take 1 tablet by mouth 2 (two) times daily.    . meclizine (ANTIVERT) 25 MG tablet Take 25 mg by mouth 2 (two) times daily as needed.     . meloxicam (MOBIC) 15 MG tablet Take 15 mg by mouth daily.     . metoprolol tartrate (LOPRESSOR) 25 MG tablet TAKE 1 TABLET BY MOUTH TWICE A DAY 180 tablet 3  . nitroGLYCERIN (NITROSTAT) 0.4 MG SL tablet Place 1 tablet (0.4 mg total) under the tongue every 5 (five) minutes as needed for chest pain. 1 tablet 0  . pantoprazole (PROTONIX) 40 MG tablet TAKE 1 TABLET BY MOUTH TWICE A DAY 180 tablet 1  . potassium chloride (K-DUR,KLOR-CON) 10 MEQ tablet TAKE 1 TABLET BY MOUTH DAILY 90 tablet 3   No current facility-administered medications on file prior to visit.     No Known Allergies  Past Medical History:  Diagnosis Date  .  Acromioclavicular joint arthritis   . Anemia   . B12 deficiency 10/15/11   "take injections q 28 days"  . Coronary artery disease    LHC 10/15/11: LAD stent patent with mid ectasia, distal LAD 50-60%, mid circumflex with ectasia, EF 55-65%.  . Dyslipidemia   . GERD (gastroesophageal reflux disease)   . Glucose intolerance (pre-diabetes)    Borderline glucose intolerance  . Hypertension   . Hypothyroidism   . Post concussive syndrome 2011   "for ~ 6-8 months"  . Rotator cuff tear   . Shortness of breath 10/15/11   "here lately I've been having it on & off any time"  . SVT (supraventricular tachycardia) ([place])    maintained on beta blocker    Past Surgical History:  Procedure Laterality Date  . BACK SURGERY    . CARDIAC CATHETERIZATION  12/19/08   NORMAL. EF 55%  . CARDIAC CATHETERIZATION  10/15/11  . CARDIOVASCULAR STRESS TEST  10/2010  . CARPAL TUNNEL RELEASE     bilaterally  . COLONOSCOPY W/ ENDOSCOPIC [place]    . CORONARY ANGIOPLASTY WITH STENT PLACEMENT  9/ 2005   . JOINT REPLACEMENT    . LEFT HEART CATHETERIZATION WITH CORONARY ANGIOGRAM N/A 10/15/2011   Procedure: LEFT HEART CATHETERIZATION WITH  CORONARY ANGIOGRAM;  Surgeon: Sherren Mocha, MD;  Location: [facility] CATH LAB;  Service: Cardiovascular;  Laterality: N/A;  . LUMBAR LAMINECTOMY  1970's  . NASAL SINUS SURGERY     x2  . reconstructive shoulder surgery     right  . REPLACEMENT TOTAL KNEE  12/2007   left  . RESECTION TUMOR CLAVICLE RADICAL     Open distal clavicle resection  . SHOULDER SURGERY  02/11/2011   right  . TRIGGER FINGER RELEASE     ? both hands    History  Smoking Status  . Former Smoker  . Packs/day: 1.00  . Years: 21.00  . Types: Cigarettes  . Quit date: 04/07/1976  Smokeless Tobacco  . Never Used    History  Alcohol Use  . Yes    Comment: 10/15/11 "haven't drank in 15-20 years"    Family History  Problem Relation Age of Onset  . Stroke Mother   . Heart attack Father   . Diabetes Father    . Kidney failure Father     Review of Systems: As noted in history of present illness.  All other systems were reviewed and are negative.  Physical Exam: BP 130/72   Pulse 65   Ht 6' (1.829 m)   Wt 202 lb 12.8 oz (92 kg)   BMI 27.50 kg/m  He is a pleasant white male in no acute distress. HEENT exam is unremarkable. He has no JVD or bruits. Lungs are clear. Cardiac exam reveals a regular rate and rhythm without gallop or murmur. Abdomen is soft and nontender. He has no edema. Pedal pulses are good. He is alert and oriented x3. Cranial nerves II through XII are intact.  LABORATORY DATA: Reviewed from primary care dated 05/14/16: cholesterol 141, triglycerides 174, HDL39, LDL 67. BUN 23, creatinine 1.5. Other chemistries, Hgb, and TSH were normal.  Ecg today shows NSR with nonspecific IVCD. I have personally reviewed and interpreted this study.   Assessment / Plan: 1. SVT. Well controlled. We will continue his current dose of metoprolol.   2. Coronary disease. S/p remote stenting of the proximal LAD with Cypher stent in 2005. Cardiac catheterization December 2012 showed nonobstructive disease. His last stress test in December of 2011 was normal. Continue DAPT indefinitely. No bleeding.  3. Dyslipidemia, well controlled.   4. Hypertension, well controlled.  I will follow up in one year

## 2016-05-30 ENCOUNTER — Encounter (INDEPENDENT_AMBULATORY_CARE_PROVIDER_SITE_OTHER): Payer: Self-pay

## 2016-05-30 ENCOUNTER — Encounter: Payer: Self-pay | Admitting: Cardiology

## 2016-05-30 ENCOUNTER — Ambulatory Visit (INDEPENDENT_AMBULATORY_CARE_PROVIDER_SITE_OTHER): Payer: Medicare Other | Admitting: Cardiology

## 2016-05-30 VITALS — BP 130/72 | HR 65 | Ht 72.0 in | Wt 202.8 lb

## 2016-05-30 DIAGNOSIS — I10 Essential (primary) hypertension: Secondary | ICD-10-CM

## 2016-05-30 DIAGNOSIS — E785 Hyperlipidemia, unspecified: Secondary | ICD-10-CM

## 2016-05-30 DIAGNOSIS — I2510 Atherosclerotic heart disease of native coronary artery without angina pectoris: Secondary | ICD-10-CM

## 2016-05-30 DIAGNOSIS — I471 Supraventricular tachycardia: Secondary | ICD-10-CM

## 2016-05-30 NOTE — Patient Instructions (Addendum)
Continue your current therapy  I will see you in one year   

## 2016-06-03 ENCOUNTER — Telehealth: Payer: Self-pay | Admitting: Cardiology

## 2016-06-03 MED ORDER — PANTOPRAZOLE SODIUM 40 MG PO TBEC: 40.0000 mg | DELAYED_RELEASE_TABLET | Freq: Two times a day (BID) | ORAL | 3 refills | Status: DC

## 2016-06-03 NOTE — Telephone Encounter (Signed)
Electronic refill sent to pharmacy.

## 2016-06-03 NOTE — Telephone Encounter (Signed)
New message   *STAT* If patient is at the pharmacy, call can be transferred to refill team.   1. Which medications need to be refilled? (please list name of each medication and dose if known) pantoprazole  2. Which pharmacy/location (including street and city if local pharmacy) is medication to be sent to? [facility] [address] [place]  3. Do they need a 30 day or 90 day supply? Pt states he does not now

## 2016-06-12 DIAGNOSIS — D510 Vitamin B12 deficiency anemia due to intrinsic factor deficiency: Secondary | ICD-10-CM | POA: Diagnosis not present

## 2016-06-17 ENCOUNTER — Other Ambulatory Visit: Payer: Self-pay | Admitting: *Deleted

## 2016-06-17 MED ORDER — POTASSIUM CHLORIDE CRYS ER 10 MEQ PO TBCR: 10.0000 meq | EXTENDED_RELEASE_TABLET | Freq: Every day | ORAL | 3 refills | Status: DC

## 2016-06-17 MED ORDER — METOPROLOL TARTRATE 25 MG PO TABS: 25.0000 mg | ORAL_TABLET | Freq: Two times a day (BID) | ORAL | 3 refills | Status: DC

## 2016-06-19 ENCOUNTER — Other Ambulatory Visit: Payer: Self-pay | Admitting: Cardiology

## 2016-07-15 DIAGNOSIS — D510 Vitamin B12 deficiency anemia due to intrinsic factor deficiency: Secondary | ICD-10-CM | POA: Diagnosis not present

## 2016-08-06 DIAGNOSIS — D510 Vitamin B12 deficiency anemia due to intrinsic factor deficiency: Secondary | ICD-10-CM | POA: Diagnosis not present

## 2016-08-09 DIAGNOSIS — Z23 Encounter for immunization: Secondary | ICD-10-CM | POA: Diagnosis not present

## 2016-09-03 ENCOUNTER — Other Ambulatory Visit: Payer: Self-pay | Admitting: Cardiology

## 2016-09-03 DIAGNOSIS — D510 Vitamin B12 deficiency anemia due to intrinsic factor deficiency: Secondary | ICD-10-CM | POA: Diagnosis not present

## 2016-09-03 NOTE — Telephone Encounter (Signed)
Rx has been sent to the pharmacy electronically. ° °

## 2016-09-08 DIAGNOSIS — Z6826 Body mass index (BMI) 26.0-26.9, adult: Secondary | ICD-10-CM | POA: Diagnosis not present

## 2016-09-08 DIAGNOSIS — J209 Acute bronchitis, unspecified: Secondary | ICD-10-CM | POA: Diagnosis not present

## 2016-09-08 DIAGNOSIS — R05 Cough: Secondary | ICD-10-CM | POA: Diagnosis not present

## 2016-10-01 DIAGNOSIS — D510 Vitamin B12 deficiency anemia due to intrinsic factor deficiency: Secondary | ICD-10-CM | POA: Diagnosis not present

## 2016-10-29 DIAGNOSIS — D510 Vitamin B12 deficiency anemia due to intrinsic factor deficiency: Secondary | ICD-10-CM | POA: Diagnosis not present

## 2016-11-26 DIAGNOSIS — D510 Vitamin B12 deficiency anemia due to intrinsic factor deficiency: Secondary | ICD-10-CM | POA: Diagnosis not present

## 2016-12-01 DIAGNOSIS — I10 Essential (primary) hypertension: Secondary | ICD-10-CM | POA: Diagnosis not present

## 2016-12-01 DIAGNOSIS — D510 Vitamin B12 deficiency anemia due to intrinsic factor deficiency: Secondary | ICD-10-CM | POA: Diagnosis not present

## 2016-12-01 DIAGNOSIS — E038 Other specified hypothyroidism: Secondary | ICD-10-CM | POA: Diagnosis not present

## 2016-12-01 DIAGNOSIS — I129 Hypertensive chronic kidney disease with stage 1 through stage 4 chronic kidney disease, or unspecified chronic kidney disease: Secondary | ICD-10-CM | POA: Diagnosis not present

## 2016-12-01 DIAGNOSIS — M1990 Unspecified osteoarthritis, unspecified site: Secondary | ICD-10-CM | POA: Diagnosis not present

## 2016-12-01 DIAGNOSIS — I2510 Atherosclerotic heart disease of native coronary artery without angina pectoris: Secondary | ICD-10-CM | POA: Diagnosis not present

## 2016-12-01 DIAGNOSIS — K219 Gastro-esophageal reflux disease without esophagitis: Secondary | ICD-10-CM | POA: Diagnosis not present

## 2016-12-01 DIAGNOSIS — R7301 Impaired fasting glucose: Secondary | ICD-10-CM | POA: Diagnosis not present

## 2016-12-01 DIAGNOSIS — Z6827 Body mass index (BMI) 27.0-27.9, adult: Secondary | ICD-10-CM | POA: Diagnosis not present

## 2016-12-01 DIAGNOSIS — N183 Chronic kidney disease, stage 3 (moderate): Secondary | ICD-10-CM | POA: Diagnosis not present

## 2016-12-01 DIAGNOSIS — Z1389 Encounter for screening for other disorder: Secondary | ICD-10-CM | POA: Diagnosis not present

## 2016-12-24 DIAGNOSIS — D510 Vitamin B12 deficiency anemia due to intrinsic factor deficiency: Secondary | ICD-10-CM | POA: Diagnosis not present

## 2016-12-29 DIAGNOSIS — H2513 Age-related nuclear cataract, bilateral: Secondary | ICD-10-CM | POA: Diagnosis not present

## 2016-12-29 DIAGNOSIS — H5000 Unspecified esotropia: Secondary | ICD-10-CM | POA: Diagnosis not present

## 2016-12-29 DIAGNOSIS — H5203 Hypermetropia, bilateral: Secondary | ICD-10-CM | POA: Diagnosis not present

## 2017-01-03 ENCOUNTER — Other Ambulatory Visit: Payer: Self-pay | Admitting: Cardiology

## 2017-01-22 DIAGNOSIS — D510 Vitamin B12 deficiency anemia due to intrinsic factor deficiency: Secondary | ICD-10-CM | POA: Diagnosis not present

## 2017-02-18 DIAGNOSIS — D510 Vitamin B12 deficiency anemia due to intrinsic factor deficiency: Secondary | ICD-10-CM | POA: Diagnosis not present

## 2017-03-18 DIAGNOSIS — D510 Vitamin B12 deficiency anemia due to intrinsic factor deficiency: Secondary | ICD-10-CM | POA: Diagnosis not present

## 2017-04-03 ENCOUNTER — Other Ambulatory Visit: Payer: Self-pay | Admitting: Cardiology

## 2017-04-03 NOTE — Telephone Encounter (Signed)
Rx(s) sent to pharmacy electronically.  

## 2017-04-22 DIAGNOSIS — D510 Vitamin B12 deficiency anemia due to intrinsic factor deficiency: Secondary | ICD-10-CM | POA: Diagnosis not present

## 2017-05-14 DIAGNOSIS — D510 Vitamin B12 deficiency anemia due to intrinsic factor deficiency: Secondary | ICD-10-CM | POA: Diagnosis not present

## 2017-05-28 ENCOUNTER — Other Ambulatory Visit: Payer: Self-pay | Admitting: Cardiology

## 2017-05-28 NOTE — Telephone Encounter (Signed)
Rx(s) sent to pharmacy electronically.  

## 2017-06-04 ENCOUNTER — Other Ambulatory Visit: Payer: Self-pay | Admitting: Cardiology

## 2017-06-05 DIAGNOSIS — R7301 Impaired fasting glucose: Secondary | ICD-10-CM | POA: Diagnosis not present

## 2017-06-05 DIAGNOSIS — D510 Vitamin B12 deficiency anemia due to intrinsic factor deficiency: Secondary | ICD-10-CM | POA: Diagnosis not present

## 2017-06-05 DIAGNOSIS — I10 Essential (primary) hypertension: Secondary | ICD-10-CM | POA: Diagnosis not present

## 2017-06-05 DIAGNOSIS — Z125 Encounter for screening for malignant neoplasm of prostate: Secondary | ICD-10-CM | POA: Diagnosis not present

## 2017-06-05 DIAGNOSIS — E038 Other specified hypothyroidism: Secondary | ICD-10-CM | POA: Diagnosis not present

## 2017-06-10 DIAGNOSIS — I10 Essential (primary) hypertension: Secondary | ICD-10-CM | POA: Diagnosis not present

## 2017-06-10 DIAGNOSIS — M1990 Unspecified osteoarthritis, unspecified site: Secondary | ICD-10-CM | POA: Diagnosis not present

## 2017-06-10 DIAGNOSIS — E784 Other hyperlipidemia: Secondary | ICD-10-CM | POA: Diagnosis not present

## 2017-06-10 DIAGNOSIS — R7302 Impaired glucose tolerance (oral): Secondary | ICD-10-CM | POA: Diagnosis not present

## 2017-06-10 DIAGNOSIS — Z0000 Encounter for general adult medical examination without abnormal findings: Secondary | ICD-10-CM | POA: Diagnosis not present

## 2017-06-10 DIAGNOSIS — D518 Other vitamin B12 deficiency anemias: Secondary | ICD-10-CM | POA: Diagnosis not present

## 2017-06-10 DIAGNOSIS — K219 Gastro-esophageal reflux disease without esophagitis: Secondary | ICD-10-CM | POA: Diagnosis not present

## 2017-06-11 ENCOUNTER — Other Ambulatory Visit: Payer: Self-pay | Admitting: Cardiology

## 2017-06-12 DIAGNOSIS — Z1212 Encounter for screening for malignant neoplasm of rectum: Secondary | ICD-10-CM | POA: Diagnosis not present

## 2017-06-12 LAB — IFOBT (OCCULT BLOOD): IMMUNOLOGICAL FECAL OCCULT BLOOD TEST: NEGATIVE

## 2017-07-12 ENCOUNTER — Other Ambulatory Visit: Payer: Self-pay | Admitting: Cardiology

## 2017-07-15 DIAGNOSIS — D510 Vitamin B12 deficiency anemia due to intrinsic factor deficiency: Secondary | ICD-10-CM | POA: Diagnosis not present

## 2017-07-24 NOTE — Progress Notes (Signed)
Johnny [name] Date of Birth: 08-29-1941   History of Present Illness: Johnny [name] is seen for followup of SVT and CAD. He has a history of supraventricular tachycardia. He is s/p stenting of the proximal LAD in 2005 with a Cypher stent. He is on chronic DAPT. He had repeat caths in 2010 and Dec. 2012 with nonobstructive disease.   On follow up today he is doing well. He denies any chest pain, SOB, or palpitations. He is still caring for an invalid wife- she has bad Parkinson's disease. He has chronic arthritis in his knees and right hip that limits his activity.   Current Outpatient Prescriptions on File Prior to Visit  Medication Sig Dispense Refill  . acetaminophen (TYLENOL) 650 MG CR tablet Take 650 mg by mouth 2 (two) times daily.    [name] aspirin 81 MG tablet Take 81 mg by mouth daily.      [name] atorvastatin (LIPITOR) 10 MG tablet Take 1 tablet (10 mg total) by mouth daily. 90 tablet 0  . clopidogrel (PLAVIX) 75 MG tablet TAKE 1 TABLET BY MOUTH EVERY DAY 90 tablet 2  . Cyanocobalamin (VITAMIN B-12 IJ) Inject as directed as directed.      . hydrOXYzine (ATARAX) 25 MG tablet Take 25 mg by mouth daily.      [name] levothyroxine (SYNTHROID, LEVOTHROID) 25 MCG tablet Take 25 mcg by mouth daily.      [name] lisinopril-hydrochlorothiazide (ZESTORETIC) 20-12.5 MG per tablet Take 1 tablet by mouth 2 (two) times daily.    . meclizine (ANTIVERT) 25 MG tablet Take 25 mg by mouth 2 (two) times daily as needed.     . meloxicam (MOBIC) 15 MG tablet Take 15 mg by mouth daily.     . metoprolol tartrate (LOPRESSOR) 25 MG tablet TAKE 1 TABLET (25 MG TOTAL) BY MOUTH 2 (TWO) TIMES DAILY. 180 tablet 3  . nitroGLYCERIN (NITROSTAT) 0.4 MG SL tablet Place 1 tablet (0.4 mg total) under the tongue every 5 (five) minutes as needed for chest pain. 1 tablet 0  . pantoprazole (PROTONIX) 40 MG tablet TAKE 1 TABLET (40 MG TOTAL) BY MOUTH 2 (TWO) TIMES DAILY. 180 tablet 3  . potassium chloride (KLOR-CON M10) 10 MEQ tablet Take 1 tablet (10  mEq total) by mouth daily. Please keep 07/29/17 appt for further refills. 90 tablet 0   No current facility-administered medications on file prior to visit.     No Known Allergies  Past Medical History:  Diagnosis Date  . Acromioclavicular joint arthritis   . Anemia   . B12 deficiency 10/15/11   "take injections q 28 days"  . Coronary artery disease    LHC 10/15/11: LAD stent patent with mid ectasia, distal LAD 50-60%, mid circumflex with ectasia, EF 55-65%.  . Dyslipidemia   . GERD (gastroesophageal reflux disease)   . Glucose intolerance (pre-diabetes)    Borderline glucose intolerance  . Hypertension   . Hypothyroidism   . Post concussive syndrome 2011   "for ~ 6-8 months"  . Rotator cuff tear   . Shortness of breath 10/15/11   "here lately I've been having it on & off any time"  . SVT (supraventricular tachycardia) ([place])    maintained on beta blocker    Past Surgical History:  Procedure Laterality Date  . BACK SURGERY    . CARDIAC CATHETERIZATION  12/19/08   NORMAL. EF 55%  . CARDIAC CATHETERIZATION  10/15/11  . CARDIOVASCULAR STRESS TEST  10/2010  . CARPAL TUNNEL RELEASE  bilaterally  . COLONOSCOPY W/ ENDOSCOPIC [place]    . CORONARY ANGIOPLASTY WITH STENT PLACEMENT  9/ 2005   . JOINT REPLACEMENT    . LEFT HEART CATHETERIZATION WITH CORONARY ANGIOGRAM N/A 10/15/2011   Procedure: LEFT HEART CATHETERIZATION WITH CORONARY ANGIOGRAM;  Surgeon: Sherren Mocha, MD;  Location: [facility] CATH LAB;  Service: Cardiovascular;  Laterality: N/A;  . LUMBAR LAMINECTOMY  1970's  . NASAL SINUS SURGERY     x2  . reconstructive shoulder surgery     right  . REPLACEMENT TOTAL KNEE  12/2007   left  . RESECTION TUMOR CLAVICLE RADICAL     Open distal clavicle resection  . SHOULDER SURGERY  02/11/2011   right  . TRIGGER FINGER RELEASE     ? both hands    History  Smoking Status  . Former Smoker  . Packs/day: 1.00  . Years: 21.00  . Types: Cigarettes  . Quit date: 04/07/1976  Smokeless  Tobacco  . Never Used    History  Alcohol Use  . Yes    Comment: 10/15/11 "haven't drank in 15-20 years"    Family History  Problem Relation Age of Onset  . Stroke Mother   . Heart attack Father   . Diabetes Father   . Kidney failure Father     Review of Systems: As noted in history of present illness.  All other systems were reviewed and are negative.  Physical Exam: BP 114/60   Pulse 70   Ht 6' (1.829 m)   Wt 203 lb 9.6 oz (92.4 kg)   BMI 27.61 kg/m  GENERAL:  Well appearing WM in NAD HEENT:  PERRL, EOMI, sclera are clear. Oropharynx is clear. NECK:  No jugular venous distention, carotid upstroke brisk and symmetric, no bruits, no thyromegaly or adenopathy LUNGS:  Clear to auscultation bilaterally CHEST:  Unremarkable HEART:  RRR,  PMI not displaced or sustained,S1 and S2 within normal limits, no S3, no S4: no clicks, no rubs, no murmurs ABD:  Soft, nontender. BS +, no masses or bruits. No hepatomegaly, no splenomegaly EXT:  2 + pulses throughout, no edema, no cyanosis no clubbing. Arthritic changes in hands. SKIN:  Warm and dry.  No rashes NEURO:  Alert and oriented x 3. Cranial nerves II through XII intact. PSYCH:  Cognitively intact    LABORATORY DATA: Reviewed from primary care dated 05/14/16: cholesterol 141, triglycerides 174, HDL39, LDL 67. BUN 23, creatinine 1.5. Other chemistries, Hgb, and TSH were normal.  Ecg today shows NSR with nonspecific IVCD. Cannot rule out septal infarct- old. I have personally reviewed and interpreted this study.   Assessment / Plan: 1. SVT. Well controlled. We will continue  metoprolol.   2. Coronary disease. S/p remote stenting of the proximal LAD with Cypher stent in 2005. Cardiac catheterization December 2012 showed nonobstructive disease. His last stress test in December of 2011 was normal. Continue DAPT indefinitely since he has a first generation DES.   3. Dyslipidemia, requested most recent labs from Dr. Reynaldo Minium. On  statin.  4. Hypertension, well controlled.  I will follow up in one year

## 2017-07-29 ENCOUNTER — Encounter: Payer: Self-pay | Admitting: Cardiology

## 2017-07-29 ENCOUNTER — Ambulatory Visit (INDEPENDENT_AMBULATORY_CARE_PROVIDER_SITE_OTHER): Payer: Medicare Other | Admitting: Cardiology

## 2017-07-29 VITALS — BP 114/60 | HR 70 | Ht 72.0 in | Wt 203.6 lb

## 2017-07-29 DIAGNOSIS — I10 Essential (primary) hypertension: Secondary | ICD-10-CM

## 2017-07-29 DIAGNOSIS — I471 Supraventricular tachycardia: Secondary | ICD-10-CM | POA: Diagnosis not present

## 2017-07-29 DIAGNOSIS — I2510 Atherosclerotic heart disease of native coronary artery without angina pectoris: Secondary | ICD-10-CM

## 2017-07-29 NOTE — Patient Instructions (Signed)
Continue your current therapy  I will see you in one year   

## 2017-08-05 DIAGNOSIS — D510 Vitamin B12 deficiency anemia due to intrinsic factor deficiency: Secondary | ICD-10-CM | POA: Diagnosis not present

## 2017-08-05 DIAGNOSIS — Z23 Encounter for immunization: Secondary | ICD-10-CM | POA: Diagnosis not present

## 2017-08-30 ENCOUNTER — Other Ambulatory Visit: Payer: Self-pay | Admitting: Cardiology

## 2017-08-31 NOTE — Telephone Encounter (Signed)
REFILL 

## 2017-09-02 DIAGNOSIS — D510 Vitamin B12 deficiency anemia due to intrinsic factor deficiency: Secondary | ICD-10-CM | POA: Diagnosis not present

## 2017-09-14 ENCOUNTER — Other Ambulatory Visit: Payer: Self-pay | Admitting: Cardiology

## 2017-09-22 DIAGNOSIS — D510 Vitamin B12 deficiency anemia due to intrinsic factor deficiency: Secondary | ICD-10-CM | POA: Diagnosis not present

## 2017-10-11 ENCOUNTER — Other Ambulatory Visit: Payer: Self-pay | Admitting: Cardiology

## 2017-10-14 DIAGNOSIS — D510 Vitamin B12 deficiency anemia due to intrinsic factor deficiency: Secondary | ICD-10-CM | POA: Diagnosis not present

## 2017-11-18 DIAGNOSIS — D510 Vitamin B12 deficiency anemia due to intrinsic factor deficiency: Secondary | ICD-10-CM | POA: Diagnosis not present

## 2017-12-14 DIAGNOSIS — I10 Essential (primary) hypertension: Secondary | ICD-10-CM | POA: Diagnosis not present

## 2017-12-14 DIAGNOSIS — N183 Chronic kidney disease, stage 3 (moderate): Secondary | ICD-10-CM | POA: Diagnosis not present

## 2017-12-14 DIAGNOSIS — K219 Gastro-esophageal reflux disease without esophagitis: Secondary | ICD-10-CM | POA: Diagnosis not present

## 2017-12-14 DIAGNOSIS — I2510 Atherosclerotic heart disease of native coronary artery without angina pectoris: Secondary | ICD-10-CM | POA: Diagnosis not present

## 2017-12-14 DIAGNOSIS — M1990 Unspecified osteoarthritis, unspecified site: Secondary | ICD-10-CM | POA: Diagnosis not present

## 2017-12-14 DIAGNOSIS — D510 Vitamin B12 deficiency anemia due to intrinsic factor deficiency: Secondary | ICD-10-CM | POA: Diagnosis not present

## 2017-12-14 DIAGNOSIS — Z6827 Body mass index (BMI) 27.0-27.9, adult: Secondary | ICD-10-CM | POA: Diagnosis not present

## 2017-12-14 DIAGNOSIS — I129 Hypertensive chronic kidney disease with stage 1 through stage 4 chronic kidney disease, or unspecified chronic kidney disease: Secondary | ICD-10-CM | POA: Diagnosis not present

## 2017-12-14 DIAGNOSIS — Z1389 Encounter for screening for other disorder: Secondary | ICD-10-CM | POA: Diagnosis not present

## 2017-12-14 DIAGNOSIS — E038 Other specified hypothyroidism: Secondary | ICD-10-CM | POA: Diagnosis not present

## 2017-12-14 DIAGNOSIS — E663 Overweight: Secondary | ICD-10-CM | POA: Diagnosis not present

## 2017-12-14 DIAGNOSIS — R7301 Impaired fasting glucose: Secondary | ICD-10-CM | POA: Diagnosis not present

## 2018-01-05 DIAGNOSIS — H5000 Unspecified esotropia: Secondary | ICD-10-CM | POA: Diagnosis not present

## 2018-01-05 DIAGNOSIS — H52203 Unspecified astigmatism, bilateral: Secondary | ICD-10-CM | POA: Diagnosis not present

## 2018-01-05 DIAGNOSIS — H2513 Age-related nuclear cataract, bilateral: Secondary | ICD-10-CM | POA: Diagnosis not present

## 2018-01-14 DIAGNOSIS — D510 Vitamin B12 deficiency anemia due to intrinsic factor deficiency: Secondary | ICD-10-CM | POA: Diagnosis not present

## 2018-02-16 ENCOUNTER — Other Ambulatory Visit: Payer: Self-pay | Admitting: Cardiology

## 2018-02-16 NOTE — Telephone Encounter (Signed)
REFILL 

## 2018-02-17 DIAGNOSIS — D510 Vitamin B12 deficiency anemia due to intrinsic factor deficiency: Secondary | ICD-10-CM | POA: Diagnosis not present

## 2018-03-17 DIAGNOSIS — D510 Vitamin B12 deficiency anemia due to intrinsic factor deficiency: Secondary | ICD-10-CM | POA: Diagnosis not present

## 2018-04-14 DIAGNOSIS — M545 Low back pain: Secondary | ICD-10-CM | POA: Diagnosis not present

## 2018-04-14 DIAGNOSIS — D510 Vitamin B12 deficiency anemia due to intrinsic factor deficiency: Secondary | ICD-10-CM | POA: Diagnosis not present

## 2018-04-14 DIAGNOSIS — R52 Pain, unspecified: Secondary | ICD-10-CM | POA: Diagnosis not present

## 2018-04-14 DIAGNOSIS — N183 Chronic kidney disease, stage 3 (moderate): Secondary | ICD-10-CM | POA: Diagnosis not present

## 2018-04-14 DIAGNOSIS — I2510 Atherosclerotic heart disease of native coronary artery without angina pectoris: Secondary | ICD-10-CM | POA: Diagnosis not present

## 2018-04-14 DIAGNOSIS — E663 Overweight: Secondary | ICD-10-CM | POA: Diagnosis not present

## 2018-04-14 DIAGNOSIS — Z6827 Body mass index (BMI) 27.0-27.9, adult: Secondary | ICD-10-CM | POA: Diagnosis not present

## 2018-04-14 DIAGNOSIS — I10 Essential (primary) hypertension: Secondary | ICD-10-CM | POA: Diagnosis not present

## 2018-04-14 DIAGNOSIS — R7309 Other abnormal glucose: Secondary | ICD-10-CM | POA: Diagnosis not present

## 2018-04-14 DIAGNOSIS — K219 Gastro-esophageal reflux disease without esophagitis: Secondary | ICD-10-CM | POA: Diagnosis not present

## 2018-04-14 DIAGNOSIS — E038 Other specified hypothyroidism: Secondary | ICD-10-CM | POA: Diagnosis not present

## 2018-04-14 DIAGNOSIS — I129 Hypertensive chronic kidney disease with stage 1 through stage 4 chronic kidney disease, or unspecified chronic kidney disease: Secondary | ICD-10-CM | POA: Diagnosis not present

## 2018-04-21 DIAGNOSIS — D510 Vitamin B12 deficiency anemia due to intrinsic factor deficiency: Secondary | ICD-10-CM | POA: Diagnosis not present

## 2018-05-18 DIAGNOSIS — D510 Vitamin B12 deficiency anemia due to intrinsic factor deficiency: Secondary | ICD-10-CM | POA: Diagnosis not present

## 2018-05-19 DIAGNOSIS — M7062 Trochanteric bursitis, left hip: Secondary | ICD-10-CM | POA: Diagnosis not present

## 2018-05-20 ENCOUNTER — Other Ambulatory Visit: Payer: Self-pay | Admitting: Cardiology

## 2018-06-01 DIAGNOSIS — M7062 Trochanteric bursitis, left hip: Secondary | ICD-10-CM | POA: Insufficient documentation

## 2018-06-05 ENCOUNTER — Other Ambulatory Visit: Payer: Self-pay | Admitting: Cardiology

## 2018-06-05 DIAGNOSIS — M25552 Pain in left hip: Secondary | ICD-10-CM | POA: Diagnosis not present

## 2018-06-09 DIAGNOSIS — D510 Vitamin B12 deficiency anemia due to intrinsic factor deficiency: Secondary | ICD-10-CM | POA: Diagnosis not present

## 2018-06-14 DIAGNOSIS — M25552 Pain in left hip: Secondary | ICD-10-CM | POA: Diagnosis not present

## 2018-06-14 DIAGNOSIS — D510 Vitamin B12 deficiency anemia due to intrinsic factor deficiency: Secondary | ICD-10-CM | POA: Diagnosis not present

## 2018-06-14 DIAGNOSIS — Z125 Encounter for screening for malignant neoplasm of prostate: Secondary | ICD-10-CM | POA: Diagnosis not present

## 2018-06-14 DIAGNOSIS — R82998 Other abnormal findings in urine: Secondary | ICD-10-CM | POA: Diagnosis not present

## 2018-06-14 DIAGNOSIS — M7062 Trochanteric bursitis, left hip: Secondary | ICD-10-CM | POA: Diagnosis not present

## 2018-06-14 DIAGNOSIS — E038 Other specified hypothyroidism: Secondary | ICD-10-CM | POA: Diagnosis not present

## 2018-06-14 DIAGNOSIS — I10 Essential (primary) hypertension: Secondary | ICD-10-CM | POA: Diagnosis not present

## 2018-06-14 DIAGNOSIS — R7301 Impaired fasting glucose: Secondary | ICD-10-CM | POA: Diagnosis not present

## 2018-06-14 DIAGNOSIS — M1612 Unilateral primary osteoarthritis, left hip: Secondary | ICD-10-CM | POA: Diagnosis not present

## 2018-06-21 DIAGNOSIS — M1990 Unspecified osteoarthritis, unspecified site: Secondary | ICD-10-CM | POA: Diagnosis not present

## 2018-06-21 DIAGNOSIS — I2510 Atherosclerotic heart disease of native coronary artery without angina pectoris: Secondary | ICD-10-CM | POA: Diagnosis not present

## 2018-06-21 DIAGNOSIS — I129 Hypertensive chronic kidney disease with stage 1 through stage 4 chronic kidney disease, or unspecified chronic kidney disease: Secondary | ICD-10-CM | POA: Diagnosis not present

## 2018-06-21 DIAGNOSIS — N183 Chronic kidney disease, stage 3 (moderate): Secondary | ICD-10-CM | POA: Diagnosis not present

## 2018-06-21 DIAGNOSIS — Z0000 Encounter for general adult medical examination without abnormal findings: Secondary | ICD-10-CM | POA: Diagnosis not present

## 2018-06-21 DIAGNOSIS — M545 Low back pain: Secondary | ICD-10-CM | POA: Diagnosis not present

## 2018-06-21 DIAGNOSIS — D510 Vitamin B12 deficiency anemia due to intrinsic factor deficiency: Secondary | ICD-10-CM | POA: Diagnosis not present

## 2018-06-21 DIAGNOSIS — E038 Other specified hypothyroidism: Secondary | ICD-10-CM | POA: Diagnosis not present

## 2018-06-21 DIAGNOSIS — I10 Essential (primary) hypertension: Secondary | ICD-10-CM | POA: Diagnosis not present

## 2018-06-21 DIAGNOSIS — K219 Gastro-esophageal reflux disease without esophagitis: Secondary | ICD-10-CM | POA: Diagnosis not present

## 2018-06-21 DIAGNOSIS — Z6827 Body mass index (BMI) 27.0-27.9, adult: Secondary | ICD-10-CM | POA: Diagnosis not present

## 2018-06-21 DIAGNOSIS — R7302 Impaired glucose tolerance (oral): Secondary | ICD-10-CM | POA: Diagnosis not present

## 2018-06-21 DIAGNOSIS — Z23 Encounter for immunization: Secondary | ICD-10-CM | POA: Diagnosis not present

## 2018-06-25 DIAGNOSIS — Z1212 Encounter for screening for malignant neoplasm of rectum: Secondary | ICD-10-CM | POA: Diagnosis not present

## 2018-07-26 DIAGNOSIS — D510 Vitamin B12 deficiency anemia due to intrinsic factor deficiency: Secondary | ICD-10-CM | POA: Diagnosis not present

## 2018-07-26 DIAGNOSIS — E663 Overweight: Secondary | ICD-10-CM | POA: Diagnosis not present

## 2018-07-26 DIAGNOSIS — M545 Low back pain: Secondary | ICD-10-CM | POA: Diagnosis not present

## 2018-07-26 DIAGNOSIS — N183 Chronic kidney disease, stage 3 (moderate): Secondary | ICD-10-CM | POA: Diagnosis not present

## 2018-07-26 DIAGNOSIS — I129 Hypertensive chronic kidney disease with stage 1 through stage 4 chronic kidney disease, or unspecified chronic kidney disease: Secondary | ICD-10-CM | POA: Diagnosis not present

## 2018-07-26 DIAGNOSIS — E038 Other specified hypothyroidism: Secondary | ICD-10-CM | POA: Diagnosis not present

## 2018-07-26 DIAGNOSIS — K219 Gastro-esophageal reflux disease without esophagitis: Secondary | ICD-10-CM | POA: Diagnosis not present

## 2018-07-26 DIAGNOSIS — R7301 Impaired fasting glucose: Secondary | ICD-10-CM | POA: Diagnosis not present

## 2018-07-26 DIAGNOSIS — M1990 Unspecified osteoarthritis, unspecified site: Secondary | ICD-10-CM | POA: Diagnosis not present

## 2018-07-26 DIAGNOSIS — I10 Essential (primary) hypertension: Secondary | ICD-10-CM | POA: Diagnosis not present

## 2018-07-26 DIAGNOSIS — I2510 Atherosclerotic heart disease of native coronary artery without angina pectoris: Secondary | ICD-10-CM | POA: Diagnosis not present

## 2018-07-28 ENCOUNTER — Other Ambulatory Visit: Payer: Self-pay | Admitting: Internal Medicine

## 2018-07-28 DIAGNOSIS — G8929 Other chronic pain: Secondary | ICD-10-CM

## 2018-07-28 DIAGNOSIS — M545 Low back pain: Principal | ICD-10-CM

## 2018-08-11 DIAGNOSIS — D510 Vitamin B12 deficiency anemia due to intrinsic factor deficiency: Secondary | ICD-10-CM | POA: Diagnosis not present

## 2018-08-13 ENCOUNTER — Ambulatory Visit (INDEPENDENT_AMBULATORY_CARE_PROVIDER_SITE_OTHER): Payer: Self-pay

## 2018-08-13 ENCOUNTER — Ambulatory Visit (INDEPENDENT_AMBULATORY_CARE_PROVIDER_SITE_OTHER): Payer: Medicare Other | Admitting: Orthopaedic Surgery

## 2018-08-13 ENCOUNTER — Encounter (INDEPENDENT_AMBULATORY_CARE_PROVIDER_SITE_OTHER): Payer: Self-pay | Admitting: Orthopaedic Surgery

## 2018-08-13 DIAGNOSIS — M25552 Pain in left hip: Secondary | ICD-10-CM

## 2018-08-13 MED ORDER — METHYLPREDNISOLONE ACETATE 40 MG/ML IJ SUSP: 40.0000 mg | Freq: Once | INTRAMUSCULAR | Status: DC

## 2018-08-13 MED ORDER — DICLOFENAC SODIUM 1 % TD GEL
2.0000 g | Freq: Four times a day (QID) | TRANSDERMAL | 5 refills | Status: AC
Start: 2018-08-13 — End: ?

## 2018-08-13 NOTE — Progress Notes (Signed)
Office Visit Note   Patient: Johnny [name]           Date of Birth: 1941-10-14           MRN: [id number] Visit Date: 08/13/2018              Requested by: Burnard Bunting, MD [address] [place], [place] [address] PCP: Burnard Bunting, MD   Assessment & Plan: Visit Diagnoses:  1. Pain in left hip     Plan: Impression is left hip degenerative labral tear and partial tearing hamstring.  At this point, we will refer the patient to Dr. Junius Roads for a diagnostic and hopefully therapeutic intra-articular cortisone injection to the left hip.  I will also propose sending him to formal physical therapy for his hamstring, but he has politely declined this.  He will follow-up with [place] as needed.  Follow-Up Instructions: Return if symptoms worsen or fail to improve.   Orders:  Orders Placed This Encounter  Procedures  . XR HIP UNILAT W OR W/O PELVIS 2-3 VIEWS LEFT   Meds ordered this encounter  Medications  . methylPREDNISolone acetate (DEPO-MEDROL) injection 40 mg      Procedures: No procedures performed   Clinical Data: No additional findings.   Subjective: Chief Complaint  Patient presents with  . Left Hip - Pain    HPI patient is a pleasant 77 year old gentleman who presents to our clinic today with continued left hip pain.  He states that this is been bothering him for the past 5 to 6 years, but over the past 6 to 8 months it has dramatically worsened.  The pain he has is to the groin, anterior thigh, lateral hip, left buttocks and posterior leg.  Pain is worse with walking as well as when he tries to sit on his left butt cheek.  He does have pain at night which keeps him from sleeping.  He has been using a heating pad as well as extra strength Tylenol with minimal relief of symptoms.  He is given meloxicam without relief of symptoms.  No numbness, tingling or burning.  He was seen by Dr. Lyla Glassing most recently where an MRI was obtained.  This showed mild to moderate cartilage  loss with extensive degenerative tearing of the labrum as well as severe partial tearing of the hamstring attachment.  Of note, he does have a history of lower back problems with an epidural steroid injection from 1999.  No previous hip injection.  He does note that he had his left knee replaced a few years back and this is when his hamstring pain started.  Review of Systems as detailed in HPI.  All others reviewed and are negative.   Objective: Vital Signs: There were no vitals taken for this visit.  Physical Exam well-developed well-nourished gentleman in no acute distress.  Alert and oriented x3.  Ortho Exam examination of the left hip reveals mildly positive logroll.  Positive Feder.  Mild tenderness to the trochanteric bursa.  Marked tenderness to the hamstring attachment at the initial tuberosity.  Increased pain to that area with straight leg raise.  He is neurovascularly intact distally.  Specialty Comments:  No specialty comments available.  Imaging: Xr Hip Unilat W Or W/o Pelvis 2-3 Views Left  Result Date: 08/13/2018 X-rays demonstrate moderate degenerative joint space narrowing    PMFS History: Patient Active Problem List   Diagnosis Date Noted  . Pain in left hip 08/13/2018  . Hypothyroidism 11/04/2011  . SVT (supraventricular  tachycardia) ([place]) 04/10/2011  . HTN (hypertension) 04/10/2011  . Dyslipidemia 04/10/2011  . Coronary atherosclerosis 11/25/2010  . GERD 11/25/2010  . CONSTIPATION 11/25/2010  . CHEST PAIN UNSPECIFIED 11/25/2010  . PERSONAL HISTORY OF COLONIC POLYPS 11/25/2010   Past Medical History:  Diagnosis Date  . Acromioclavicular joint arthritis   . Anemia   . B12 deficiency 10/15/11   "take injections q 28 days"  . Coronary artery disease    LHC 10/15/11: LAD stent patent with mid ectasia, distal LAD 50-60%, mid circumflex with ectasia, EF 55-65%.  . Dyslipidemia   . GERD (gastroesophageal reflux disease)   . Glucose intolerance (pre-diabetes)     Borderline glucose intolerance  . Hypertension   . Hypothyroidism   . Post concussive syndrome 2011   "for ~ 6-8 months"  . Rotator cuff tear   . Shortness of breath 10/15/11   "here lately I've been having it on & off any time"  . SVT (supraventricular tachycardia) ([facility])    maintained on beta blocker    Family History  Problem Relation Age of Onset  . Stroke Mother   . Heart attack Father   . Diabetes Father   . Kidney failure Father     Past Surgical History:  Procedure Laterality Date  . BACK SURGERY    . CARDIAC CATHETERIZATION  12/19/08   NORMAL. EF 55%  . CARDIAC CATHETERIZATION  10/15/11  . CARDIOVASCULAR STRESS TEST  10/2010  . CARPAL TUNNEL RELEASE     bilaterally  . COLONOSCOPY W/ ENDOSCOPIC [place]    . CORONARY ANGIOPLASTY WITH STENT PLACEMENT  9/ 2005   . JOINT REPLACEMENT    . LEFT HEART CATHETERIZATION WITH CORONARY ANGIOGRAM N/A 10/15/2011   Procedure: LEFT HEART CATHETERIZATION WITH CORONARY ANGIOGRAM;  Surgeon: Sherren Mocha, MD;  Location[facility] C[facility] LAB;  Service: Cardiovascular;  Laterality: N/A;  . LUMBAR LAMINECTOMY  1970's  . NASAL SINUS SURGERY     x2  . reconstructive shoulder surgery     right  . REPLACEMENT TOTAL KNEE  12/2007   left  . RESECTION TUMOR CLAVICLE RADICAL     Open distal clavicle resection  . SHOULDER SURGERY  02/11/2011   right  . TRIGGER FINGER RELEASE     ? both hands   Social History   Occupational History    Employer: RETIRED  Tobacco Use  . Smoking status: Former Smoker    Packs/day: 1.00    Years: 21.00    Pack years: 21.00    Types: Cigarettes    Last attempt to quit: 04/07/1976    Years since quitting: 42.3  . Smokeless tobacco: Never Used  Substance and Sexual Activity  . Alcohol use: Yes    Comment: 10/15/11 "haven't drank in 15-20 years"  . Drug use: No  . Sexual activity: Not Currently

## 2018-08-13 NOTE — Progress Notes (Signed)
Subjective: He is here for ultrasound-guided left hip injection.  Chronic pain after falling, complains mainly of buttocks pain.  MRI scan showed proximal hamstring tendinopathy as well as hip arthritis.  Objective: He does have some pain with passive hip flexion and internal rotation, but most of his pain is with palpation of ischial tuberosity.  Procedure: Ultrasound-guided left proximal hamstring tendon/ischial bursa injection: After sterile prep with Betadine, injected 8 cc 1% lidocaine without epinephrine and 40 mg methylprednisolone using ultrasound to guide needle placement at the ischial tuberosity, proximal hamstring tendon attachment.  He had very good pain relief during the immediate anesthetic phase.

## 2018-08-16 ENCOUNTER — Other Ambulatory Visit: Payer: Self-pay | Admitting: Cardiology

## 2018-08-23 ENCOUNTER — Ambulatory Visit (INDEPENDENT_AMBULATORY_CARE_PROVIDER_SITE_OTHER): Payer: Medicare Other | Admitting: Physician Assistant

## 2018-08-25 ENCOUNTER — Ambulatory Visit (INDEPENDENT_AMBULATORY_CARE_PROVIDER_SITE_OTHER): Payer: Medicare Other | Admitting: Physician Assistant

## 2018-08-25 ENCOUNTER — Other Ambulatory Visit: Payer: Self-pay | Admitting: Cardiology

## 2018-08-26 ENCOUNTER — Other Ambulatory Visit: Payer: Self-pay | Admitting: Cardiology

## 2018-08-26 NOTE — Telephone Encounter (Signed)
Rx has been sent to the pharmacy electronically. ° °

## 2018-08-29 ENCOUNTER — Other Ambulatory Visit: Payer: Self-pay | Admitting: Cardiology

## 2018-09-01 ENCOUNTER — Ambulatory Visit (INDEPENDENT_AMBULATORY_CARE_PROVIDER_SITE_OTHER): Payer: Medicare Other | Admitting: Orthopaedic Surgery

## 2018-09-03 ENCOUNTER — Encounter (INDEPENDENT_AMBULATORY_CARE_PROVIDER_SITE_OTHER): Payer: Self-pay | Admitting: Physician Assistant

## 2018-09-03 ENCOUNTER — Ambulatory Visit (INDEPENDENT_AMBULATORY_CARE_PROVIDER_SITE_OTHER): Payer: Medicare Other | Admitting: Physician Assistant

## 2018-09-03 DIAGNOSIS — M25552 Pain in left hip: Secondary | ICD-10-CM

## 2018-09-03 NOTE — Progress Notes (Signed)
Subjective: He is here for left hip injection.  Initial tuberosity injection gave only temporary relief during the anesthetic phase.  Still complaining of pain mostly on the posterior aspect.  Objective: Tender posteriorly at the ischial tuberosity but also has some pain with passive internal hip rotation.  Impression: Left hip neuritis with possible hamstring tendinopathy  Plan: Intra-articular left hip injection today.  Physical therapy at [facility] physical therapy.  Follow-up as needed.  Procedure: Ultrasound-guided left hip injection: After sterile prep with Betadine injected 5 cc 1% lidocaine without epinephrine and 40 mg methylprednisolone using a 22-gauge spinal needle, passing the needle through the iliofemoral ligament into the femoral head/neck junction.  Injectate was seen filling the joint capsule.  He started to have good pain relief during the anesthetic phase.

## 2018-09-03 NOTE — Progress Notes (Signed)
Office Visit Note   Patient: Johnny [name]           Date of Birth: [birth year]           MRN: [id number] Visit Date: 09/03/2018              Requested by: Burnard Bunting, MD [address] [place], [place] [address] PCP: Burnard Bunting, MD   Assessment & Plan: Visit Diagnoses:  1. Pain in left hip     Plan: Impression is left hip labral tear and left partial tear of the hamstring.  I have really counseled the patient on the need to attend formal physical therapy for his hamstring.  He is agreed to do so.  A prescription was given to the patient today for this.  We will also refer him to Dr. Ernestina Patches for a left hip intra-articular cortisone injection.  He will follow-up with [place] in 4 weeks time for recheck.  Follow-Up Instructions: Return in about 4 weeks (around 10/01/2018).   Orders:  No orders of the defined types were placed in this encounter.  No orders of the defined types were placed in this encounter.     Procedures: No procedures performed   Clinical Data: No additional findings.   Subjective: Chief Complaint  Patient presents with  . Left Hip - Pain    HPI patient is a pleasant 77 year old gentleman who presents to our clinic today with continued left hip pain.  History of partial hamstring tear in the left hip labral tear.  He was seen in our office about a month ago for this.  Dr. Junius Roads injected his hamstring which has moderately improved his symptoms.  We have suggested physical therapy at that point but the patient politely declined.  His pain has started to return, however.  The majority of his pain is to the left buttocks worse when sitting down.  He does note occasional pain to the left groin and anterior thigh.  Review of Systems as detailed in HPI.  All others reviewed and are negative.   Objective: Vital Signs: There were no vitals taken for this visit.  Physical Exam well-developed well-nourished gentleman no acute distress.  Alert and oriented  x3.  Ortho Exam examination of his left hip reveals minimally positive logroll.  Negative straight leg raise.  Marked tenderness to the ischial tuberosity.  Specialty Comments:  No specialty comments available.  Imaging: No new imaging   PMFS History: Patient Active Problem List   Diagnosis Date Noted  . Pain in left hip 08/13/2018  . Hypothyroidism 11/04/2011  . SVT (supraventricular tachycardia) ([place]) 04/10/2011  . HTN (hypertension) 04/10/2011  . Dyslipidemia 04/10/2011  . Coronary atherosclerosis 11/25/2010  . GERD 11/25/2010  . CONSTIPATION 11/25/2010  . CHEST PAIN UNSPECIFIED 11/25/2010  . PERSONAL HISTORY OF COLONIC POLYPS 11/25/2010   Past Medical History:  Diagnosis Date  . Acromioclavicular joint arthritis   . Anemia   . B12 deficiency 10/15/11   "take injections q 28 days"  . Coronary artery disease    LHC 10/15/11: LAD stent patent with mid ectasia, distal LAD 50-60%, mid circumflex with ectasia, EF 55-65%.  . Dyslipidemia   . GERD (gastroesophageal reflux disease)   . Glucose intolerance (pre-diabetes)    Borderline glucose intolerance  . Hypertension   . Hypothyroidism   . Post concussive syndrome 2011   "for ~ 6-8 months"  . Rotator cuff tear   . Shortness of breath 10/15/11   "here lately I've been having  it on & off any time"  . SVT (supraventricular tachycardia) ([facility])    maintained on beta blocker    Family History  Problem Relation Age of Onset  . Stroke Mother   . Heart attack Father   . Diabetes Father   . Kidney failure Father     Past Surgical History:  Procedure Laterality Date  . BACK SURGERY    . CARDIAC CATHETERIZATION  12/19/08   NORMAL. EF 55%  . CARDIAC CATHETERIZATION  10/15/11  . CARDIOVASCULAR STRESS TEST  10/2010  . CARPAL TUNNEL RELEASE     bilaterally  . COLONOSCOPY W/ ENDOSCOPIC [place]    . CORONARY ANGIOPLASTY WITH STENT PLACEMENT  9/ 2005   . JOINT REPLACEMENT    . LEFT HEART CATHETERIZATION WITH CORONARY ANGIOGRAM N/A  10/15/2011   Procedure: LEFT HEART CATHETERIZATION WITH CORONARY ANGIOGRAM;  Surgeon: Sherren Mocha, MD;  Location: [facility] CATH LAB;  Service: Cardiovascular;  Laterality: N/A;  . LUMBAR LAMINECTOMY  1970's  . NASAL SINUS SURGERY     x2  . reconstructive shoulder surgery     right  . REPLACEMENT TOTAL KNEE  12/2007   left  . RESECTION TUMOR CLAVICLE RADICAL     Open distal clavicle resection  . SHOULDER SURGERY  02/11/2011   right  . TRIGGER FINGER RELEASE     ? both hands   Social History   Occupational History    Employer: RETIRED  Tobacco Use  . Smoking status: Former Smoker    Packs/day: 1.00    Years: 21.00    Pack years: 21.00    Types: Cigarettes    Last attempt to quit: 04/07/1976    Years since quitting: 42.4  . Smokeless tobacco: Never Used  Substance and Sexual Activity  . Alcohol use: Yes    Comment: 10/15/11 "haven't drank in 15-20 years"  . Drug use: No  . Sexual activity: Not Currently

## 2018-09-08 DIAGNOSIS — D510 Vitamin B12 deficiency anemia due to intrinsic factor deficiency: Secondary | ICD-10-CM | POA: Diagnosis not present

## 2018-09-26 ENCOUNTER — Other Ambulatory Visit: Payer: Self-pay | Admitting: Cardiology

## 2018-10-01 ENCOUNTER — Ambulatory Visit (INDEPENDENT_AMBULATORY_CARE_PROVIDER_SITE_OTHER): Payer: Medicare Other | Admitting: Orthopaedic Surgery

## 2018-10-01 ENCOUNTER — Encounter (INDEPENDENT_AMBULATORY_CARE_PROVIDER_SITE_OTHER): Payer: Self-pay | Admitting: Orthopaedic Surgery

## 2018-10-01 DIAGNOSIS — M25552 Pain in left hip: Secondary | ICD-10-CM | POA: Diagnosis not present

## 2018-10-01 MED ORDER — ACETAMINOPHEN-CODEINE #3 300-30 MG PO TABS: 1.0000 | ORAL_TABLET | Freq: Every day | ORAL | 3 refills | Status: AC | PRN

## 2018-10-01 NOTE — Progress Notes (Signed)
Office Visit Note   Patient: Johnny [name]           Date of Birth: September 03, 1941           MRN: [id number] Visit Date: 10/01/2018              Requested by: Johnny Bunting, MD [address] [place], [place] [address] PCP: Johnny Bunting, MD   Assessment & Plan: Visit Diagnoses:  1. Pain in left hip     Plan: Impression is severe left proximal hamstring tendinopathy with small contribution of his left hip pain due to his arthritis and abductor tendinosis.  We had a long discussion with his daughter about realistic expectations for pain relief.  I stressed the importance of physical therapy and activity modification.  Questions encouraged and answered.  Follow-up as needed. Total face to face encounter time was greater than 25 minutes and over half of this time was spent in counseling and/or coordination of care.  Follow-Up Instructions: Return if symptoms worsen or fail to improve.   Orders:  No orders of the defined types were placed in this encounter.  No orders of the defined types were placed in this encounter.     Procedures: No procedures performed   Clinical Data: No additional findings.   Subjective: Chief Complaint  Patient presents with  . Left Hip - Follow-up    Johnny [name] is following up today for his continued left hip pain.  He states that the majority of his pain is posterior over the initial tuberosity.  He did have an MRI earlier this year which showed severe tearing of the proximal hamstring insertion.  He has not done any physical therapy because that he feels this will not help.  He takes Tylenol on a regular basis.  He denies any significant groin or lateral hip pain.  He presents today with his daughter.   Review of Systems   Objective: Vital Signs: There were no vitals taken for this visit.  Physical Exam  Ortho Exam Left hip exam shows no significant pain with range of motion.  His lateral which hip is mildly tender.  No significant pain with  resisted hip abduction.  His ischio tuberosity is significantly tender and pain is worse with knee extension. Specialty Comments:  No specialty comments available.  Imaging: No results found.   PMFS History: Patient Active Problem List   Diagnosis Date Noted  . Pain in left hip 08/13/2018  . Hypothyroidism 11/04/2011  . SVT (supraventricular tachycardia) ([place]) 04/10/2011  . HTN (hypertension) 04/10/2011  . Dyslipidemia 04/10/2011  . Coronary atherosclerosis 11/25/2010  . GERD 11/25/2010  . CONSTIPATION 11/25/2010  . CHEST PAIN UNSPECIFIED 11/25/2010  . PERSONAL HISTORY OF COLONIC POLYPS 11/25/2010   Past Medical History:  Diagnosis Date  . Acromioclavicular joint arthritis   . Anemia   . B12 deficiency 10/15/11   "take injections q 28 days"  . Coronary artery disease    LHC 10/15/11: LAD stent patent with mid ectasia, distal LAD 50-60%, mid circumflex with ectasia, EF 55-65%.  . Dyslipidemia   . GERD (gastroesophageal reflux disease)   . Glucose intolerance (pre-diabetes)    Borderline glucose intolerance  . Hypertension   . Hypothyroidism   . Post concussive syndrome 2011   "for ~ 6-8 months"  . Rotator cuff tear   . Shortness of breath 10/15/11   "here lately I've been having it on & off any time"  . SVT (supraventricular tachycardia) ([place])    maintained  on beta blocker    Family History  Problem Relation Age of Onset  . Stroke Mother   . Heart attack Father   . Diabetes Father   . Kidney failure Father     Past Surgical History:  Procedure Laterality Date  . BACK SURGERY    . CARDIAC CATHETERIZATION  12/19/08   NORMAL. EF 55%  . CARDIAC CATHETERIZATION  10/15/11  . CARDIOVASCULAR STRESS TEST  10/2010  . CARPAL TUNNEL RELEASE     bilaterally  . COLONOSCOPY W/ ENDOSCOPIC [place]    . CORONARY ANGIOPLASTY WITH STENT PLACEMENT  9/ 2005   . JOINT REPLACEMENT    . LEFT HEART CATHETERIZATION WITH CORONARY ANGIOGRAM N/A 10/15/2011   Procedure: LEFT HEART  CATHETERIZATION WITH CORONARY ANGIOGRAM;  Surgeon: Johnny Mocha, MD;  Location: [facility] CATH LAB;  Service: Cardiovascular;  Laterality: N/A;  . LUMBAR LAMINECTOMY  1970's  . NASAL SINUS SURGERY     x2  . reconstructive shoulder surgery     right  . REPLACEMENT TOTAL KNEE  12/2007   left  . RESECTION TUMOR CLAVICLE RADICAL     Open distal clavicle resection  . SHOULDER SURGERY  02/11/2011   right  . TRIGGER FINGER RELEASE     ? both hands   Social History   Occupational History    Employer: RETIRED  Tobacco Use  . Smoking status: Former Smoker    Packs/day: 1.00    Years: 21.00    Pack years: 21.00    Types: Cigarettes    Last attempt to quit: 04/07/1976    Years since quitting: 42.5  . Smokeless tobacco: Never Used  Substance and Sexual Activity  . Alcohol use: Yes    Comment: 10/15/11 "haven't drank in 15-20 years"  . Drug use: No  . Sexual activity: Not Currently

## 2018-10-06 DIAGNOSIS — D510 Vitamin B12 deficiency anemia due to intrinsic factor deficiency: Secondary | ICD-10-CM | POA: Diagnosis not present

## 2018-10-11 DIAGNOSIS — M25552 Pain in left hip: Secondary | ICD-10-CM | POA: Diagnosis not present

## 2018-10-11 DIAGNOSIS — M629 Disorder of muscle, unspecified: Secondary | ICD-10-CM | POA: Diagnosis not present

## 2018-10-11 DIAGNOSIS — M25562 Pain in left knee: Secondary | ICD-10-CM | POA: Diagnosis not present

## 2018-10-11 DIAGNOSIS — R262 Difficulty in walking, not elsewhere classified: Secondary | ICD-10-CM | POA: Diagnosis not present

## 2018-10-13 DIAGNOSIS — R262 Difficulty in walking, not elsewhere classified: Secondary | ICD-10-CM | POA: Diagnosis not present

## 2018-10-13 DIAGNOSIS — M629 Disorder of muscle, unspecified: Secondary | ICD-10-CM | POA: Diagnosis not present

## 2018-10-13 DIAGNOSIS — M25552 Pain in left hip: Secondary | ICD-10-CM | POA: Diagnosis not present

## 2018-10-13 DIAGNOSIS — M25562 Pain in left knee: Secondary | ICD-10-CM | POA: Diagnosis not present

## 2018-10-15 DIAGNOSIS — M25552 Pain in left hip: Secondary | ICD-10-CM | POA: Diagnosis not present

## 2018-10-15 DIAGNOSIS — M25562 Pain in left knee: Secondary | ICD-10-CM | POA: Diagnosis not present

## 2018-10-15 DIAGNOSIS — R262 Difficulty in walking, not elsewhere classified: Secondary | ICD-10-CM | POA: Diagnosis not present

## 2018-10-15 DIAGNOSIS — M629 Disorder of muscle, unspecified: Secondary | ICD-10-CM | POA: Diagnosis not present

## 2018-10-18 DIAGNOSIS — R262 Difficulty in walking, not elsewhere classified: Secondary | ICD-10-CM | POA: Diagnosis not present

## 2018-10-18 DIAGNOSIS — M25552 Pain in left hip: Secondary | ICD-10-CM | POA: Diagnosis not present

## 2018-10-18 DIAGNOSIS — M25562 Pain in left knee: Secondary | ICD-10-CM | POA: Diagnosis not present

## 2018-10-18 DIAGNOSIS — M629 Disorder of muscle, unspecified: Secondary | ICD-10-CM | POA: Diagnosis not present

## 2018-10-22 DIAGNOSIS — R262 Difficulty in walking, not elsewhere classified: Secondary | ICD-10-CM | POA: Diagnosis not present

## 2018-10-22 DIAGNOSIS — M25562 Pain in left knee: Secondary | ICD-10-CM | POA: Diagnosis not present

## 2018-10-22 DIAGNOSIS — M629 Disorder of muscle, unspecified: Secondary | ICD-10-CM | POA: Diagnosis not present

## 2018-10-22 DIAGNOSIS — M25552 Pain in left hip: Secondary | ICD-10-CM | POA: Diagnosis not present

## 2018-10-25 DIAGNOSIS — M629 Disorder of muscle, unspecified: Secondary | ICD-10-CM | POA: Diagnosis not present

## 2018-10-25 DIAGNOSIS — M25552 Pain in left hip: Secondary | ICD-10-CM | POA: Diagnosis not present

## 2018-10-25 DIAGNOSIS — M25562 Pain in left knee: Secondary | ICD-10-CM | POA: Diagnosis not present

## 2018-10-25 DIAGNOSIS — R262 Difficulty in walking, not elsewhere classified: Secondary | ICD-10-CM | POA: Diagnosis not present

## 2018-10-29 ENCOUNTER — Emergency Department (HOSPITAL_COMMUNITY): Payer: Medicare Other

## 2018-10-29 ENCOUNTER — Encounter (HOSPITAL_COMMUNITY): Payer: Self-pay

## 2018-10-29 ENCOUNTER — Inpatient Hospital Stay (HOSPITAL_COMMUNITY): Payer: Medicare Other

## 2018-10-29 ENCOUNTER — Observation Stay (HOSPITAL_COMMUNITY)
Admission: EM | Admit: 2018-10-29 | Discharge: 2018-10-30 | Disposition: A | Discharge status: Home / Self Care | Payer: Medicare Other | Attending: Cardiology | Admitting: Cardiology

## 2018-10-29 ENCOUNTER — Encounter (HOSPITAL_COMMUNITY)
Admission: EM | Disposition: A | Discharge status: Home / Self Care | Payer: Self-pay | Source: Home / Self Care | Attending: Emergency Medicine

## 2018-10-29 DIAGNOSIS — N183 Chronic kidney disease, stage 3 (moderate): Secondary | ICD-10-CM | POA: Insufficient documentation

## 2018-10-29 DIAGNOSIS — Z87891 Personal history of nicotine dependence: Secondary | ICD-10-CM | POA: Diagnosis not present

## 2018-10-29 DIAGNOSIS — I252 Old myocardial infarction: Secondary | ICD-10-CM | POA: Insufficient documentation

## 2018-10-29 DIAGNOSIS — Z7902 Long term (current) use of antithrombotics/antiplatelets: Secondary | ICD-10-CM | POA: Insufficient documentation

## 2018-10-29 DIAGNOSIS — Z79899 Other long term (current) drug therapy: Secondary | ICD-10-CM | POA: Diagnosis not present

## 2018-10-29 DIAGNOSIS — Z7982 Long term (current) use of aspirin: Secondary | ICD-10-CM | POA: Insufficient documentation

## 2018-10-29 DIAGNOSIS — I25119 Atherosclerotic heart disease of native coronary artery with unspecified angina pectoris: Secondary | ICD-10-CM | POA: Diagnosis not present

## 2018-10-29 DIAGNOSIS — Z791 Long term (current) use of non-steroidal anti-inflammatories (NSAID): Secondary | ICD-10-CM | POA: Diagnosis not present

## 2018-10-29 DIAGNOSIS — E039 Hypothyroidism, unspecified: Secondary | ICD-10-CM | POA: Insufficient documentation

## 2018-10-29 DIAGNOSIS — E785 Hyperlipidemia, unspecified: Secondary | ICD-10-CM | POA: Insufficient documentation

## 2018-10-29 DIAGNOSIS — I471 Supraventricular tachycardia: Secondary | ICD-10-CM | POA: Diagnosis not present

## 2018-10-29 DIAGNOSIS — Z96652 Presence of left artificial knee joint: Secondary | ICD-10-CM | POA: Diagnosis not present

## 2018-10-29 DIAGNOSIS — R0789 Other chest pain: Secondary | ICD-10-CM | POA: Diagnosis not present

## 2018-10-29 DIAGNOSIS — R072 Precordial pain: Secondary | ICD-10-CM

## 2018-10-29 DIAGNOSIS — Z955 Presence of coronary angioplasty implant and graft: Secondary | ICD-10-CM | POA: Diagnosis not present

## 2018-10-29 DIAGNOSIS — R000 Tachycardia, unspecified: Secondary | ICD-10-CM | POA: Diagnosis not present

## 2018-10-29 DIAGNOSIS — Z79890 Hormone replacement therapy (postmenopausal): Secondary | ICD-10-CM | POA: Insufficient documentation

## 2018-10-29 DIAGNOSIS — I129 Hypertensive chronic kidney disease with stage 1 through stage 4 chronic kidney disease, or unspecified chronic kidney disease: Secondary | ICD-10-CM | POA: Insufficient documentation

## 2018-10-29 DIAGNOSIS — N289 Disorder of kidney and ureter, unspecified: Secondary | ICD-10-CM | POA: Diagnosis not present

## 2018-10-29 DIAGNOSIS — R0602 Shortness of breath: Secondary | ICD-10-CM

## 2018-10-29 DIAGNOSIS — I10 Essential (primary) hypertension: Secondary | ICD-10-CM | POA: Diagnosis not present

## 2018-10-29 DIAGNOSIS — K219 Gastro-esophageal reflux disease without esophagitis: Secondary | ICD-10-CM | POA: Insufficient documentation

## 2018-10-29 DIAGNOSIS — R079 Chest pain, unspecified: Secondary | ICD-10-CM | POA: Diagnosis not present

## 2018-10-29 LAB — BASIC METABOLIC PANEL
Anion gap: 11 (ref 5–15)
BUN: 21 mg/dL (ref 8–23)
CO2: 25 mmol/L (ref 22–32)
Calcium: 9.4 mg/dL (ref 8.9–10.3)
Chloride: 103 mmol/L (ref 98–111)
Creatinine, Ser: 1.79 mg/dL — ABNORMAL HIGH (ref 0.61–1.24)
GFR calc Af Amer: 41 mL/min — ABNORMAL LOW (ref >60)
GFR calc non Af Amer: 35 mL/min — ABNORMAL LOW (ref >60)
Glucose, Bld: 119 mg/dL — ABNORMAL HIGH (ref 70–99)
Potassium: 3.5 mmol/L (ref 3.5–5.1)
Sodium: 139 mmol/L (ref 135–145)

## 2018-10-29 LAB — I-STAT TROPONIN, ED: Troponin i, poc: 0 ng/mL (ref 0.00–0.08)

## 2018-10-29 LAB — MAGNESIUM: Magnesium: 2.4 mg/dL (ref 1.7–2.4)

## 2018-10-29 LAB — TROPONIN I
Troponin I: <0.03 ng/mL (ref <0.03)
Troponin I: <0.03 ng/mL (ref <0.03)

## 2018-10-29 LAB — CBC WITH DIFFERENTIAL/PLATELET
Abs Immature Granulocytes: 0.03 10*3/uL (ref 0.00–0.07)
BASOS PCT: 1 %
Basophils Absolute: 0.1 10*3/uL (ref 0.0–0.1)
EOS ABS: 0.2 10*3/uL (ref 0.0–0.5)
Eosinophils Relative: 2 %
HCT: 49 % (ref 39.0–52.0)
Hemoglobin: 15.9 g/dL (ref 13.0–17.0)
Immature Granulocytes: 0 %
Lymphocytes Relative: 24 %
Lymphs Abs: 2.3 10*3/uL (ref 0.7–4.0)
MCH: 30.8 pg (ref 26.0–34.0)
MCHC: 32.4 g/dL (ref 30.0–36.0)
MCV: 94.8 fL (ref 80.0–100.0)
Monocytes Absolute: 1.3 10*3/uL — ABNORMAL HIGH (ref 0.1–1.0)
Monocytes Relative: 13 %
Neutro Abs: 5.7 10*3/uL (ref 1.7–7.7)
Neutrophils Relative %: 60 %
Platelets: 237 10*3/uL (ref 150–400)
RBC: 5.17 MIL/uL (ref 4.22–5.81)
RDW: 11.9 % (ref 11.5–15.5)
WBC: 9.5 10*3/uL (ref 4.0–10.5)
nRBC: 0 % (ref 0.0–0.2)

## 2018-10-29 LAB — HEMOGLOBIN A1C
Hgb A1c MFr Bld: 5 % (ref 4.8–5.6)
Mean Plasma Glucose: 96.8 mg/dL

## 2018-10-29 LAB — HEPATIC FUNCTION PANEL
ALT: 12 U/L (ref 0–44)
AST: 16 U/L (ref 15–41)
Albumin: 3.5 g/dL (ref 3.5–5.0)
Alkaline Phosphatase: 41 U/L (ref 38–126)
Bilirubin, Direct: 0.1 mg/dL (ref 0.0–0.2)
Indirect Bilirubin: 0.4 mg/dL (ref 0.3–0.9)
Total Bilirubin: 0.5 mg/dL (ref 0.3–1.2)
Total Protein: 6.3 g/dL — ABNORMAL LOW (ref 6.5–8.1)

## 2018-10-29 LAB — TSH: TSH: 2.943 u[IU]/mL (ref 0.350–4.500)

## 2018-10-29 LAB — BRAIN NATRIURETIC PEPTIDE: B Natriuretic Peptide: 56 pg/mL (ref 0.0–100.0)

## 2018-10-29 LAB — HEPARIN LEVEL (UNFRACTIONATED): Heparin Unfractionated: 0.43 IU/mL (ref 0.30–0.70)

## 2018-10-29 LAB — T4, FREE: Free T4: 0.91 ng/dL (ref 0.82–1.77)

## 2018-10-29 LAB — D-DIMER, QUANTITATIVE: D-Dimer, Quant: 0.56 ug/mL-FEU — ABNORMAL HIGH (ref 0.00–0.50)

## 2018-10-29 SURGERY — LEFT HEART CATH AND CORONARY ANGIOGRAPHY
Anesthesia: LOCAL

## 2018-10-29 MED ORDER — CLOPIDOGREL BISULFATE 75 MG PO TABS
75.0000 mg | ORAL_TABLET | Freq: Every day | ORAL | Status: DC
  Administered 2018-10-30: 75 mg via ORAL
  Filled 2018-10-29: qty 1

## 2018-10-29 MED ORDER — NITROGLYCERIN 0.4 MG SL SUBL
0.4000 mg | SUBLINGUAL_TABLET | SUBLINGUAL | Status: DC | PRN
  Administered 2018-10-29: 0.4 mg via SUBLINGUAL
  Filled 2018-10-29: qty 1

## 2018-10-29 MED ORDER — ASPIRIN 300 MG RE SUPP: 300.0000 mg | RECTAL | Status: AC

## 2018-10-29 MED ORDER — MOVE FREE JOINT HEALTH ADVANCE PO TABS: 1.0000 | ORAL_TABLET | Freq: Two times a day (BID) | ORAL | Status: DC

## 2018-10-29 MED ORDER — HEPARIN BOLUS VIA INFUSION
4000.0000 [IU] | Freq: Once | INTRAVENOUS | Status: AC
  Administered 2018-10-29: 4000 [IU] via INTRAVENOUS
  Filled 2018-10-29: qty 4000

## 2018-10-29 MED ORDER — ACETAMINOPHEN 325 MG PO TABS
650.0000 mg | ORAL_TABLET | ORAL | Status: DC | PRN
  Administered 2018-10-30 (×2): 650 mg via ORAL
  Filled 2018-10-29: qty 2

## 2018-10-29 MED ORDER — MELOXICAM 7.5 MG PO TABS
15.0000 mg | ORAL_TABLET | Freq: Every day | ORAL | Status: DC
  Administered 2018-10-29 – 2018-10-30 (×2): 15 mg via ORAL
  Filled 2018-10-29 (×2): qty 2

## 2018-10-29 MED ORDER — TECHNETIUM TO 99M ALBUMIN AGGREGATED
4.0000 | Freq: Once | INTRAVENOUS | Status: AC | PRN
  Administered 2018-10-29: 4 via INTRAVENOUS

## 2018-10-29 MED ORDER — ASPIRIN 81 MG PO CHEW
81.0000 mg | CHEWABLE_TABLET | Freq: Every day | ORAL | Status: DC
  Filled 2018-10-29: qty 1

## 2018-10-29 MED ORDER — DICLOFENAC SODIUM 1 % TD GEL
2.0000 g | Freq: Four times a day (QID) | TRANSDERMAL | Status: DC
  Administered 2018-10-29 – 2018-10-30 (×2): 2 g via TOPICAL
  Filled 2018-10-29: qty 100

## 2018-10-29 MED ORDER — TECHNETIUM TC 99M DIETHYLENETRIAME-PENTAACETIC ACID
30.0000 | Freq: Once | INTRAVENOUS | Status: AC | PRN
  Administered 2018-10-29: 30 via INTRAVENOUS

## 2018-10-29 MED ORDER — LEVOTHYROXINE SODIUM 25 MCG PO TABS
25.0000 ug | ORAL_TABLET | Freq: Every day | ORAL | Status: DC
  Administered 2018-10-30: 25 ug via ORAL
  Filled 2018-10-29: qty 1

## 2018-10-29 MED ORDER — METOPROLOL TARTRATE 50 MG PO TABS
50.0000 mg | ORAL_TABLET | Freq: Two times a day (BID) | ORAL | Status: DC
  Administered 2018-10-29 – 2018-10-30 (×2): 50 mg via ORAL
  Filled 2018-10-29 (×2): qty 1

## 2018-10-29 MED ORDER — SODIUM CHLORIDE 0.9 % IV SOLN
INTRAVENOUS | Status: DC
  Administered 2018-10-29: 15:00:00 via INTRAVENOUS

## 2018-10-29 MED ORDER — SENNA 8.6 MG PO TABS
1.0000 | ORAL_TABLET | Freq: Every day | ORAL | Status: DC
  Administered 2018-10-30: 8.6 mg via ORAL
  Filled 2018-10-29: qty 1

## 2018-10-29 MED ORDER — ASPIRIN EC 81 MG PO TBEC
81.0000 mg | DELAYED_RELEASE_TABLET | Freq: Every day | ORAL | Status: DC
  Administered 2018-10-30: 81 mg via ORAL
  Filled 2018-10-29: qty 1

## 2018-10-29 MED ORDER — ACETAMINOPHEN 325 MG PO TABS
650.0000 mg | ORAL_TABLET | Freq: Two times a day (BID) | ORAL | Status: DC
  Administered 2018-10-29: 650 mg via ORAL
  Filled 2018-10-29 (×2): qty 2

## 2018-10-29 MED ORDER — PANTOPRAZOLE SODIUM 40 MG PO TBEC
40.0000 mg | DELAYED_RELEASE_TABLET | Freq: Two times a day (BID) | ORAL | Status: DC
  Administered 2018-10-29 – 2018-10-30 (×2): 40 mg via ORAL
  Filled 2018-10-29 (×2): qty 1

## 2018-10-29 MED ORDER — ATORVASTATIN CALCIUM 10 MG PO TABS
10.0000 mg | ORAL_TABLET | Freq: Every day | ORAL | Status: DC
  Administered 2018-10-30: 10 mg via ORAL
  Filled 2018-10-29: qty 1

## 2018-10-29 MED ORDER — HEPARIN (PORCINE) 25000 UT/250ML-% IV SOLN
1200.0000 [IU]/h | INTRAVENOUS | Status: DC
  Administered 2018-10-29 – 2018-10-30 (×2): 1200 [IU]/h via INTRAVENOUS
  Filled 2018-10-29 (×2): qty 250

## 2018-10-29 MED ORDER — NITROGLYCERIN 0.4 MG SL SUBL: 0.4000 mg | SUBLINGUAL_TABLET | SUBLINGUAL | Status: DC | PRN

## 2018-10-29 MED ORDER — ONDANSETRON HCL 4 MG/2ML IJ SOLN: 4.0000 mg | Freq: Four times a day (QID) | INTRAMUSCULAR | Status: DC | PRN

## 2018-10-29 MED ORDER — SODIUM CHLORIDE 0.9 % IV BOLUS
500.0000 mL | Freq: Once | INTRAVENOUS | Status: AC
  Administered 2018-10-29: 500 mL via INTRAVENOUS

## 2018-10-29 MED ORDER — ASPIRIN 81 MG PO CHEW: 324.0000 mg | CHEWABLE_TABLET | ORAL | Status: AC

## 2018-10-29 MED ORDER — HYDROXYZINE HCL 25 MG PO TABS
25.0000 mg | ORAL_TABLET | Freq: Every day | ORAL | Status: DC
  Administered 2018-10-29 – 2018-10-30 (×2): 25 mg via ORAL
  Filled 2018-10-29 (×2): qty 1

## 2018-10-29 NOTE — ED Provider Notes (Signed)
[facility] EMERGENCY DEPARTMENT Provider Note   CSN: [id number] Arrival date & time: 10/29/18  1140     History   Chief Complaint Chief Complaint  Patient presents with  . Chest Pain    HPI Johnny [name] is a 78 y.o. male.  The history is provided by the patient, the EMS personnel and medical records. No language interpreter was used.  Chest Pain     Johnny [name] is a 78 y.o. male who presents to the Emergency Department complaining of CP. He presents to the emergency department by EMS with complaints of chest pain. He reports central and left sided chest pain that began a few weeks ago. Pain is waxing and waning and worse with exertion. Today he developed associated shortness of breath. He had diaphoresis two nights ago, now resolved. He denies any fevers, coughing, abdominal pain. Pain is similar to prior MI. He received 324 of aspirin by EMS prior to ED arrival as well as one nitroglycerin. He did have partial improvement in his pain following nitroglycerin administration. Past Medical History:  Diagnosis Date  . Acromioclavicular joint arthritis   . Anemia   . B12 deficiency 10/15/11   "take injections q 28 days"  . Coronary artery disease    LHC 10/15/11: LAD stent patent with mid ectasia, distal LAD 50-60%, mid circumflex with ectasia, EF 55-65%.  . Dyslipidemia   . GERD (gastroesophageal reflux disease)   . Glucose intolerance (pre-diabetes)    Borderline glucose intolerance  . Hypertension   . Hypothyroidism   . Post concussive syndrome 2011   "for ~ 6-8 months"  . Rotator cuff tear   . Shortness of breath 10/15/11   "here lately I've been having it on & off any time"  . SVT (supraventricular tachycardia) ([place])    maintained on beta blocker    Patient Active Problem List   Diagnosis Date Noted  . Tachycardia 10/29/2018  . Pain in left hip 08/13/2018  . Hypothyroidism 11/04/2011  . SVT (supraventricular tachycardia) ([place]) 04/10/2011  . HTN  (hypertension) 04/10/2011  . Dyslipidemia 04/10/2011  . Coronary atherosclerosis 11/25/2010  . GERD 11/25/2010  . CONSTIPATION 11/25/2010  . CHEST PAIN UNSPECIFIED 11/25/2010  . PERSONAL HISTORY OF COLONIC POLYPS 11/25/2010    Past Surgical History:  Procedure Laterality Date  . BACK SURGERY    . CARDIAC CATHETERIZATION  12/19/08   NORMAL. EF 55%  . CARDIAC CATHETERIZATION  10/15/11  . CARDIOVASCULAR STRESS TEST  10/2010  . CARPAL TUNNEL RELEASE     bilaterally  . COLONOSCOPY W/ ENDOSCOPIC [place]    . CORONARY ANGIOPLASTY WITH STENT PLACEMENT  9/ 2005   . JOINT REPLACEMENT    . LEFT HEART CATHETERIZATION WITH CORONARY ANGIOGRAM N/A 10/15/2011   Procedure: LEFT HEART CATHETERIZATION WITH CORONARY ANGIOGRAM;  Surgeon: Sherren Mocha, MD;  Location: [facility] CATH LAB;  Service: Cardiovascular;  Laterality: N/A;  . LUMBAR LAMINECTOMY  1970's  . NASAL SINUS SURGERY     x2  . reconstructive shoulder surgery     right  . REPLACEMENT TOTAL KNEE  12/2007   left  . RESECTION TUMOR CLAVICLE RADICAL     Open distal clavicle resection  . SHOULDER SURGERY  02/11/2011   right  . TRIGGER FINGER RELEASE     ? both hands        Home Medications    Prior to Admission medications   Medication Sig Start Date End Date Taking? Authorizing Provider  acetaminophen (TYLENOL) 650  MG CR tablet Take 650 mg by mouth 2 (two) times daily.   Yes [provider]  aspirin 81 MG tablet Take 81 mg by mouth daily.     Yes [provider]  atorvastatin (LIPITOR) 10 MG tablet TAKE 1 TABLET BY MOUTH EVERY DAY Patient taking differently: Take 10 mg by mouth daily.  09/27/18  Yes [place], Peter M, MD  clopidogrel (PLAVIX) 75 MG tablet TAKE 1 TABLET BY MOUTH EVERY DAY Patient taking differently: Take 75 mg by mouth daily.  08/16/18  Yes [place], Peter M, MD  Cyanocobalamin (VITAMIN B-12 IJ) Inject as directed every 30 (thirty) days.    Yes [provider]  diclofenac sodium (VOLTAREN) 1 % GEL Apply  2 g topically 4 (four) times daily. Patient taking differently: Apply 2 g topically 4 (four) times daily as needed (hip pain).  08/13/18  Yes Aundra Dubin, PA-C  Glucos-Chond-Hyal Ac-Ca Fructo (MOVE FREE JOINT HEALTH ADVANCE PO) Take 1 capsule by mouth 2 (two) times daily.   Yes [provider]  hydrOXYzine (ATARAX) 25 MG tablet Take 25 mg by mouth daily.     Yes [provider]  levothyroxine (SYNTHROID, LEVOTHROID) 25 MCG tablet Take 25 mcg by mouth daily.     Yes [provider]  lisinopril-hydrochlorothiazide (ZESTORETIC) 20-12.5 MG per tablet Take 1 tablet by mouth 2 (two) times daily. 10/01/11  Yes [place], Peter M, MD  meclizine (ANTIVERT) 25 MG tablet Take 25 mg by mouth 2 (two) times daily as needed for dizziness or nausea.    Yes [provider]  meloxicam (MOBIC) 15 MG tablet Take 15 mg by mouth daily.    Yes [provider]  metoprolol tartrate (LOPRESSOR) 25 MG tablet TAKE 1 TABLET (25 MG TOTAL) BY MOUTH 2 (TWO) TIMES DAILY. 06/07/18  Yes [place], Peter M, MD  nitroGLYCERIN (NITROSTAT) 0.4 MG SL tablet Place 1 tablet (0.4 mg total) under the tongue every 5 (five) minutes as needed for chest pain. 10/15/11  Yes Larey Dresser, MD  pantoprazole (PROTONIX) 40 MG tablet Take 1 tablet (40 mg total) by mouth 2 (two) times daily. NEED OV. 08/25/18  Yes [place], Peter M, MD  potassium chloride (KLOR-CON M10) 10 MEQ tablet Take 1 tablet (10 mEq total) daily by mouth. 08/31/17  Yes [place], Peter M, MD  senna (SENOKOT) 8.6 MG TABS tablet Take 1 tablet by mouth daily.   Yes [provider]    Family History Family History  Problem Relation Age of Onset  . Stroke Mother   . Heart attack Father   . Diabetes Father   . Kidney failure Father     Social History Social History   Tobacco Use  . Smoking status: Former Smoker    Packs/day: 1.00    Years: 21.00    Pack years: 21.00    Types: Cigarettes    Last attempt to quit: 04/07/1976     Years since quitting: 42.5  . Smokeless tobacco: Never Used  Substance Use Topics  . Alcohol use: Yes    Comment: 10/15/11 "haven't drank in 15-20 years"  . Drug use: No     Allergies   Patient has no known allergies.   Review of Systems Review of Systems  Cardiovascular: Positive for chest pain.  All other systems reviewed and are negative.    Physical Exam Updated Vital Signs BP (!) 140/95   Pulse 70   Temp 98 F (36.7 C) (Oral)   Resp 17  Ht 6' (1.829 m)   Wt 95.3 kg   SpO2 100%   BMI 28.48 kg/m   Physical Exam Vitals signs and nursing note reviewed.  Constitutional:      Appearance: He is well-developed.     Comments: Uncomfortable appearing  HENT:     Head: Normocephalic and atraumatic.  Cardiovascular:     Rate and Rhythm: Normal rate and regular rhythm.     Heart sounds: No murmur.  Pulmonary:     Effort: No respiratory distress.     Comments: tachypnea Abdominal:     Palpations: Abdomen is soft.     Tenderness: There is no abdominal tenderness. There is no guarding or rebound.  Musculoskeletal:        General: No swelling or tenderness.  Skin:    General: Skin is warm and dry.  Neurological:     Mental Status: He is alert and oriented to person, place, and time.  Psychiatric:        Mood and Affect: Mood normal.        Behavior: Behavior normal.      ED Treatments / Results  Labs (all labs ordered are listed, but only abnormal results are displayed) Labs Reviewed  BASIC METABOLIC PANEL - Abnormal; Notable for the following components:      Result Value   Glucose, Bld 119 (*)    Creatinine, Ser 1.79 (*)    GFR calc non Af Amer 35 (*)    GFR calc Af Amer 41 (*)    All other components within normal limits  CBC WITH DIFFERENTIAL/PLATELET - Abnormal; Notable for the following components:   Monocytes Absolute 1.3 (*)    All other components within normal limits  D-DIMER, QUANTITATIVE (NOT AT [facility]) - Abnormal; Notable for the following  components:   D-Dimer, Quant 0.56 (*)    All other components within normal limits  HEPATIC FUNCTION PANEL - Abnormal; Notable for the following components:   Total Protein 6.3 (*)    All other components within normal limits  MAGNESIUM  TSH  T4, FREE  TROPONIN I  HEMOGLOBIN A1C  BRAIN NATRIURETIC PEPTIDE  TROPONIN I  TROPONIN I  HEPARIN LEVEL (UNFRACTIONATED)  I-STAT TROPONIN, ED    EKG EKG Interpretation  Date/Time:  Friday October 29 2018 11:58:32 EST Ventricular Rate:  88 PR Interval:    QRS Duration: 123 QT Interval:  393 QTC Calculation: 476 R Axis:   -27 Text Interpretation:  Sinus rhythm Left bundle branch block Confirmed by Quintella Reichert [id number]) on 10/29/2018 12:07:46 PM   Radiology Nm Pulmonary Perf And Vent  Result Date: 10/29/2018 CLINICAL DATA:  Chest pain and shortness of Breath EXAM: NUCLEAR MEDICINE VENTILATION - PERFUSION LUNG SCAN TECHNIQUE: Ventilation images were obtained in multiple projections using inhaled aerosol Tc-58m DTPA. Perfusion images were obtained in multiple projections after intravenous injection of Tc-22m MAA. RADIOPHARMACEUTICALS:  32 mCi of Tc-39m DTPA aerosol inhalation and 4.1 mCi Tc56m MAA IV COMPARISON:  Chest x-ray from earlier in the same day. FINDINGS: Ventilation: No focal ventilation defect. Perfusion: No wedge shaped peripheral perfusion defects to suggest acute pulmonary embolism. IMPRESSION: No ventilation perfusion mismatch to suggest pulmonary embolism. Electronically Signed   By: Inez Catalina M.D.   On: 10/29/2018 16:08   Dg Chest Port 1 View  Result Date: 10/29/2018 CLINICAL DATA:  Chest pain EXAM: PORTABLE CHEST 1 VIEW COMPARISON:  10/15/2011 FINDINGS: Cardiac shadows within normal limits. Thoracic aorta is tortuous. The lungs are well aerated bilaterally without  focal infiltrate or sizable effusion. No acute bony abnormality is noted. IMPRESSION: No active disease. Electronically Signed   By: Inez Catalina M.D.   On:  10/29/2018 12:17    Procedures Procedures (including critical care time)  Medications Ordered in ED Medications  aspirin chewable tablet 324 mg (324 mg Oral Not Given 10/29/18 1429)    Or  aspirin suppository 300 mg ( Rectal See Alternative 10/29/18 1429)  aspirin EC tablet 81 mg (has no administration in time range)  nitroGLYCERIN (NITROSTAT) SL tablet 0.4 mg (has no administration in time range)  acetaminophen (TYLENOL) tablet 650 mg (has no administration in time range)  ondansetron (ZOFRAN) injection 4 mg (has no administration in time range)  0.9 %  sodium chloride infusion ( Intravenous New Bag/Given 10/29/18 1437)  heparin ADULT infusion 100 units/mL (25000 units/248mL sodium chloride 0.45%) (1,200 Units/hr Intravenous New Bag/Given 10/29/18 1440)  sodium chloride 0.9 % bolus 500 mL (0 mLs Intravenous Stopped 10/29/18 1347)  heparin bolus via infusion 4,000 Units (4,000 Units Intravenous Bolus from Bag 10/29/18 1440)  technetium TC 95M diethylenetriame-pentaacetic acid (DTPA) injection 30 millicurie (30 millicuries Intravenous Given 10/29/18 1524)  technetium albumin aggregated (MAA) injection solution 4 millicurie (4 millicuries Intravenous Contrast Given 10/29/18 1523)     Initial Impression / Assessment and Plan / ED Course  I have reviewed the triage vital signs and the nursing notes.  Pertinent labs & imaging results that were available during my care of the patient were reviewed by me and considered in my medical decision making (see chart for details).     Patient here for evaluation of progressive chest pain with shortness of breath. Patient is uncomfortable appearing on evaluation. He is low risk for PE and D dimer is negative. Concern for possible unstable angina. Cardiology consulted for further recommendations and treatment.  Final Clinical Impressions(s) / ED Diagnoses   Final diagnoses:  None    ED Discharge Orders    None       Quintella Reichert, MD 10/29/18 1630

## 2018-10-29 NOTE — ED Notes (Signed)
Pt states " I can't get enough air"; sats 98% RA; pt placed on 2L O2 for comfort

## 2018-10-29 NOTE — ED Triage Notes (Signed)
Pt from home via ems; c/o intermittent cp and sob x several weeks; central, pressure; 324 asa, 1 nitro given PTA w/ some relief; hx stent placement several years ago  162/98 P 95 RR 18 CBG 201 97% RA

## 2018-10-29 NOTE — ED Notes (Signed)
Attempted report 

## 2018-10-29 NOTE — H&P (Addendum)
Cardiology History and physical :   Patient ID: Johnny [name] MRN: [id number]; DOB: Jun 16, 1941  Admit date: 10/29/2018 Date of Consult: 10/29/2018  Primary Care Provider: Burnard Bunting, MD Primary Cardiologist: Peter Martinique, MD Primary Electrophysiologist:  None    Patient Profile:   Johnny [name] is a 78 y.o. male with a hx of SVT and CAD with stent to pLAD in 2005 with cypher stent, chronic DAPT, GERD, HTN, HLD, pre-diabetes who is being seen today for the evaluation of chest pain at the request of Dr. Ralene Bathe.  History of Present Illness:   [name] has history of SVT, CAD with stent to pLAD in 2005 on chronic DAPT.   No active disease.  Last cath 2012 with patent stent in pLAD, diffuse ectasia in mLAD dLAD diffuse non obstructive disease with 50-60% stenosis, LCX patent, RCA is small non dominant vessel, normal LV.  SVT controlled in past with BB.  HTN controlled.  Also with GERD, HLD on statin.       Today pt presents increased DOE and chest pressure, though he has  Trouble remembering the pressure.  He has been having more "flutters" recently.  Short bursts, then today it was a long burst.  With that symptomatic with SOB and pressure.  Though unclear which came first the flutters or the pressure.  Recently pt with Lt hip pain and thigh pain, has seen several MDs and ortho feels it is a shredded hamstring so pt having PT.  But with all the pain he has had he has not felt well.  And increased dyspnea with any exertion, waking at night for no reason.  And decreased appetite due to pain.    Today due to long episode he came to ER.   Here he stated he could not get enough air.  He was given NTG with some relief.  With talking he becomes winded easily.  No pain or pressure currently.   EKG:  The EKG was personally reviewed and demonstrates:  SR and no acute changes. Telemetry:  Telemetry was personally reviewed and demonstrates:  SR with PVCs  Troponin 0.00 Na 139 K+ 3.5 Cr 1.79 GFR 35  Hgb  15.9, plts 237 ddimer 0.56  PCXR Cardiac shadows within normal limits. Thoracic aorta is tortuous. The lungs are well aerated bilaterally without focal infiltrate or sizable effusion. No acute bony abnormality is noted. IMPRESSION: No active disease  Past Medical History:  Diagnosis Date  . Acromioclavicular joint arthritis   . Anemia   . B12 deficiency 10/15/11   "take injections q 28 days"  . Coronary artery disease    LHC 10/15/11: LAD stent patent with mid ectasia, distal LAD 50-60%, mid circumflex with ectasia, EF 55-65%.  . Dyslipidemia   . GERD (gastroesophageal reflux disease)   . Glucose intolerance (pre-diabetes)    Borderline glucose intolerance  . Hypertension   . Hypothyroidism   . Post concussive syndrome 2011   "for ~ 6-8 months"  . Rotator cuff tear   . Shortness of breath 10/15/11   "here lately I've been having it on & off any time"  . SVT (supraventricular tachycardia) ([place])    maintained on beta blocker    Past Surgical History:  Procedure Laterality Date  . BACK SURGERY    . CARDIAC CATHETERIZATION  12/19/08   NORMAL. EF 55%  . CARDIAC CATHETERIZATION  10/15/11  . CARDIOVASCULAR STRESS TEST  10/2010  . CARPAL TUNNEL RELEASE     bilaterally  .  COLONOSCOPY W/ ENDOSCOPIC [place]    . CORONARY ANGIOPLASTY WITH STENT PLACEMENT  9/ 2005   . JOINT REPLACEMENT    . LEFT HEART CATHETERIZATION WITH CORONARY ANGIOGRAM N/A 10/15/2011   Procedure: LEFT HEART CATHETERIZATION WITH CORONARY ANGIOGRAM;  Surgeon: Sherren Mocha, MD;  Location: [facility] C[facility] LAB;  Service: Cardiovascular;  Laterality: N/A;  . LUMBAR LAMINECTOMY  1970's  . NASAL SINUS SURGERY     x2  . reconstructive shoulder surgery     right  . REPLACEMENT TOTAL KNEE  12/2007   left  . RESECTION TUMOR CLAVICLE RADICAL     Open distal clavicle resection  . SHOULDER SURGERY  02/11/2011   right  . TRIGGER FINGER RELEASE     ? both hands     Home Medications:  Prior to Admission medications     Medication Sig Start Date End Date Taking? Authorizing Provider  acetaminophen (TYLENOL) 650 MG CR tablet Take 650 mg by mouth 2 (two) times daily.    [provider]  aspirin 81 MG tablet Take 81 mg by mouth daily.      [provider]  atorvastatin (LIPITOR) 10 MG tablet TAKE 1 TABLET BY MOUTH EVERY DAY 09/27/18   Martinique, Peter M, MD  clopidogrel (PLAVIX) 75 MG tablet TAKE 1 TABLET BY MOUTH EVERY DAY 08/16/18   Martinique, Peter M, MD  Cyanocobalamin (VITAMIN B-12 IJ) Inject as directed as directed.      [provider]  diclofenac sodium (VOLTAREN) 1 % GEL Apply 2 g topically 4 (four) times daily. 08/13/18   Aundra Dubin, PA-C  hydrOXYzine (ATARAX) 25 MG tablet Take 25 mg by mouth daily.      [provider]  KLOR-CON M10 10 MEQ tablet TAKE 1 TABLET (10 MEQ TOTAL) BY MOUTH DAILY. 08/26/18   Martinique, Peter M, MD  KLOR-CON M10 10 MEQ tablet TAKE 1 TABLET (10 MEQ TOTAL) BY MOUTH DAILY. 08/30/18   Martinique, Peter M, MD  levothyroxine (SYNTHROID, LEVOTHROID) 25 MCG tablet Take 25 mcg by mouth daily.      [provider]  lisinopril-hydrochlorothiazide (ZESTORETIC) 20-12.5 MG per tablet Take 1 tablet by mouth 2 (two) times daily. 10/01/11   Martinique, Peter M, MD  meclizine (ANTIVERT) 25 MG tablet Take 25 mg by mouth 2 (two) times daily as needed.     [provider]  meloxicam (MOBIC) 15 MG tablet Take 15 mg by mouth daily.     [provider]  metoprolol tartrate (LOPRESSOR) 25 MG tablet TAKE 1 TABLET (25 MG TOTAL) BY MOUTH 2 (TWO) TIMES DAILY. 06/07/18   Martinique, Peter M, MD  nitroGLYCERIN (NITROSTAT) 0.4 MG SL tablet Place 1 tablet (0.4 mg total) under the tongue every 5 (five) minutes as needed for chest pain. 10/15/11   Larey Dresser, MD  pantoprazole (PROTONIX) 40 MG tablet Take 1 tablet (40 mg total) by mouth 2 (two) times daily. NEED OV. 08/25/18   Martinique, Peter M, MD  potassium chloride (KLOR-CON M10) 10 MEQ tablet Take 1 tablet (10 mEq  total) daily by mouth. 08/31/17   Martinique, Peter M, MD    Inpatient Medications: Scheduled Meds: . methylPREDNISolone acetate  40 mg Intra-articular Once   Continuous Infusions: . sodium chloride 500 mL (10/29/18 1209)   PRN Meds: nitroGLYCERIN  Allergies:   No Known Allergies  Social History:   Social History   Socioeconomic History  . Marital status: Married    Spouse name: Not on file  . Number  of children: 2  . Years of education: Not on file  . Highest education level: Not on file  Occupational History    Employer: RETIRED  Social Needs  . Financial resource strain: Not on file  . Food insecurity:    Worry: Not on file    Inability: Not on file  . Transportation needs:    Medical: Not on file    Non-medical: Not on file  Tobacco Use  . Smoking status: Former Smoker    Packs/day: 1.00    Years: 21.00    Pack years: 21.00    Types: Cigarettes    Last attempt to quit: 04/07/1976    Years since quitting: 42.5  . Smokeless tobacco: Never Used  Substance and Sexual Activity  . Alcohol use: Yes    Comment: 10/15/11 "haven't drank in 15-20 years"  . Drug use: No  . Sexual activity: Not Currently  Lifestyle  . Physical activity:    Days per week: Not on file    Minutes per session: Not on file  . Stress: Not on file  Relationships  . Social connections:    Talks on phone: Not on file    Gets together: Not on file    Attends religious service: Not on file    Active member of club or organization: Not on file    Attends meetings of clubs or organizations: Not on file    Relationship status: Not on file  . Intimate partner violence:    Fear of current or ex partner: Not on file    Emotionally abused: Not on file    Physically abused: Not on file    Forced sexual activity: Not on file  Other Topics Concern  . Not on file  Social History Narrative  . Not on file    Family History:    Family History  Problem Relation Age of Onset  . Stroke Mother   .  Heart attack Father   . Diabetes Father   . Kidney failure Father      ROS:  Please see the history of present illness.  General:no colds or fevers, no weight changes Skin:no rashes or ulcers HEENT:no blurred vision, no congestion CV:see HPI PUL:see HPI GI:no diarrhea constipation or melena, no indigestion GU:no hematuria, no dysuria MS:+ Lt hip pain and into thigh, no claudication Neuro:no syncope, no lightheadedness Endo:no diabetes, no thyroid disease  All other ROS reviewed and negative.     Physical Exam/Data:   Vitals:   10/29/18 1147 10/29/18 1148 10/29/18 1203 10/29/18 1215  BP: (!) 125/92  97/74 100/87  Pulse: 90   94  Resp: (!) 22   (!) 22  Temp: 98 F (36.7 C)     TempSrc: Oral     SpO2: 99%   93%  Weight:  95.3 kg    Height:  6' (1.829 m)     No intake or output data in the 24 hours ending 10/29/18 1225 Filed Weights   10/29/18 1148  Weight: 95.3 kg   Body mass index is 28.48 kg/m.  General:  Well nourished, well developed, in no acute distress, tearful at times, and becomes winded with conversation.  HEENT: normal Lymph: no adenopathy Neck: mild JVD Endocrine:  No thryomegaly Vascular: No carotid bruits; ? pedal pulses bilaterally  Cardiac:  normal S1, S2; RRR; no murmur, gallup rub or click  Lungs:  clear to auscultation bilaterally, no wheezing, rhonchi or rales  Abd: soft, nontender, no hepatomegaly  Ext:  no edema Musculoskeletal:  No deformities, BUE and BLE strength normal and equal Skin: warm and dry  Neuro:  Alert and oriented, though difficult time giving hx., no focal abnormalities noted Psych:  Normal affect    Relevant CV Studies: See cath above  Laboratory Data:  ChemistryNo results for input(s): NA, K, CL, CO2, GLUCOSE, BUN, CREATININE, CALCIUM, GFRNONAA, GFRAA, ANIONGAP in the last 168 hours.  No results for input(s): PROT, ALBUMIN, AST, ALT, ALKPHOS, BILITOT in the last 168 hours. HematologyNo results for input(s): WBC, RBC,  HGB, HCT, MCV, MCH, MCHC, RDW, PLT in the last 168 hours. Cardiac EnzymesNo results for input(s): TROPONINI in the last 168 hours.  Recent Labs  Lab 10/29/18 1158  TROPIPOC 0.00    BNPNo results for input(s): BNP, PROBNP in the last 168 hours.  DDimer No results for input(s): DDIMER in the last 168 hours.  Radiology/Studies:  Dg Chest Port 1 View  Result Date: 10/29/2018 CLINICAL DATA:  Chest pain EXAM: PORTABLE CHEST 1 VIEW COMPARISON:  10/15/2011 FINDINGS: Cardiac shadows within normal limits. Thoracic aorta is tortuous. The lungs are well aerated bilaterally without focal infiltrate or sizable effusion. No acute bony abnormality is noted. IMPRESSION: No active disease. Electronically Signed   By: Inez Catalina M.D.   On: 10/29/2018 12:17    Assessment and Plan:   1. Chest pain with dyspnea. Known CAD no acute EKG changes, but increase of presumed SVT episodes and increasing DOE until today longer episode of tachycardia and chest pressure.  Possible cardiac cath vs. nuc study.  His Cr is elevated.  Dr. Claiborne Billings to see.  Admit and monitor over weekend, check echo and BNP.  Schlink need cath but will see how he does over weekend.   2. CAD with stent to pLAD in 2005 and last cath it was patent in 2012.  Did have distal dz of 50-60% in LAD.  3. SVT seems to be having more episodes - metoprolol has controlled in past- no lightheadedness with Tachycardia  4. HTN   125/92 here to 102/68 continue home meds 5. CKD 3 Cr 1.79   Will hold lisinopril HCTZ for now, BP lower end and we increased BB.  If no cath could resume or resume after the cath. 6. HLD on statin       For questions or updates, please contact [place] Please consult [email or web address] for contact info under    Signed, Cecilie Kicks, NP  10/29/2018 12:25 PM    Patient seen and examined. Agree with assessment and plan.  Mr. Marvis Lindler is a 78 year old patient who is followed by Dr. Martinique.  He has a history of CAD and underwent stenting  to his proximal LAD in 2005.  He has a history of hypertension, hyperlipidemia, and has a history of SVT.  He has been on chronic dual antiplatelet therapy.  According to his daughter, recently she has noticed that he has resumed drinking sweet tea which she had refrain from in the past.  Yesterday he experienced an episode of fluttering in the chest followed by a chest sensation.  He again had more palpitations later in the day leading to his emergency room presentation.  He admits at times he feels that he cannot get enough air.  He was given sublingual nitroglycerin with Larkin Ina of benefit.  Presently, he is chest pain-free.  Blood pressure earlier was 125/92 and more recently 108/86.  Laboratory reveals sinus rhythm in the 80s to 90s.  There is no ectopy.  ENT is unremarkable.  He did not have carotid bruits or JVD.  There was no wheezing or rales.  Rhythm was regular with 1/6 systolic murmur.  Abdomen was soft nontender.  He did not have clubbing cyanosis or edema.  His ECGs have remained unremarkable and did not demonstrate any ischemic ECG changes.  Initial troponin is negative.  Creatinine is increased at 1.79.  D-dimer is minimally positive at 0.59.  Chest x-ray did not reveal any active disease.  There was no evidence for interstitial edema. I suspect some of the patient's symptoms [place] be related to recurrent tachydysrhythmia.  There has been some increased incidence of drinking caffeinated beverage with sweet tea as well as some of his wife's Pepsi's.  He does not drink coffee.  With his renal insufficiency, I do not feel presently there is an indication for mediate cardiac catheterization.  Recommend holding lisinopril in light of his renal insufficiency.  With his minimally increased d-dimer suggest VQ scan to make certain there is no suggestion of PE.  Recommend titration of beta-blocker therapy to metoprolol 50 mg twice daily if blood pressure allows.  We discussed the importance of restriction of  caffeine.  Recommend serial troponin levels and ECG.  Recommend 2D echo Doppler study.  Thyroid function studies, and serum magnesium.  Will follow.  Troy Sine, MD, [facility] 10/29/2018 2:02 PM

## 2018-10-29 NOTE — ED Notes (Signed)
Per 6N CN, the sink in pt's room is currently being worked on; CN will call when maintenance is complete

## 2018-10-29 NOTE — ED Notes (Signed)
Pt transported to nuclear medicine. 

## 2018-10-29 NOTE — Progress Notes (Signed)
[place] for heparin Indication: chest pain/ACS  No Known Allergies  Patient Measurements: Height: 6' (182.9 cm) Weight: 210 lb (95.3 kg) IBW/kg (Calculated) : 77.6 Heparin Dosing Weight: 95.3kg  Vital Signs: Temp: 98.2 F (36.8 C) (01/03 2150) Temp Source: Oral (01/03 2150) BP: 134/69 (01/03 2150) Pulse Rate: 68 (01/03 2150)  Labs: Recent Labs    10/29/18 1153 10/29/18 1413 10/29/18 2213  HGB 15.9  --   --   HCT 49.0  --   --   PLT 237  --   --   HEPARINUNFRC  --   --  0.43  CREATININE 1.79*  --   --   TROPONINI  --  <0.03 <0.03    Estimated Creatinine Clearance: 40.1 mL/min (A) (by C-G formula based on SCr of 1.79 mg/dL (H)).  Assessment: 78 y.o. male with chest pain for heparin   Goal of Therapy:  Heparin level 0.3-0.7 units/ml Monitor platelets by anticoagulation protocol: Yes   Plan:  Continue Heparin at current rate   Antonia Jicha, Bronson Curb 10/29/2018,11:49 PM

## 2018-10-29 NOTE — ED Notes (Signed)
Per 6E CN, done fixing sink; EVS to mop, will call when room is clean

## 2018-10-29 NOTE — Progress Notes (Signed)
ANTICOAGULATION CONSULT NOTE - Initial Consult  Pharmacy Consult for heparin Indication: chest pain/ACS  No Known Allergies  Patient Measurements: Height: 6' (182.9 cm) Weight: 210 lb (95.3 kg) IBW/kg (Calculated) : 77.6 Heparin Dosing Weight: 95.3kg  Vital Signs: Temp: 98 F (36.7 C) (01/03 1147) Temp Source: Oral (01/03 1147) BP: 106/82 (01/03 1345) Pulse Rate: 77 (01/03 1345)  Labs: Recent Labs    10/29/18 1153  HGB 15.9  HCT 49.0  PLT 237  CREATININE 1.79*    Estimated Creatinine Clearance: 40.1 mL/min (A) (by C-G formula based on SCr of 1.79 mg/dL (H)).   Medical History: Past Medical History:  Diagnosis Date  . Acromioclavicular joint arthritis   . Anemia   . B12 deficiency 10/15/11   "take injections q 28 days"  . Coronary artery disease    LHC 10/15/11: LAD stent patent with mid ectasia, distal LAD 50-60%, mid circumflex with ectasia, EF 55-65%.  . Dyslipidemia   . GERD (gastroesophageal reflux disease)   . Glucose intolerance (pre-diabetes)    Borderline glucose intolerance  . Hypertension   . Hypothyroidism   . Post concussive syndrome 2011   "for ~ 6-8 months"  . Rotator cuff tear   . Shortness of breath 10/15/11   "here lately I've been having it on & off any time"  . SVT (supraventricular tachycardia) ([facility])    maintained on beta blocker    Medications:  Infusions:  . sodium chloride    . heparin      Assessment: 57 yom presented to the ED with CP. To start IV heparin. Baseline CBC is WNL and pt is not on anticoagulation PTA.   Goal of Therapy:  Heparin level 0.3-0.7 units/ml Monitor platelets by anticoagulation protocol: Yes   Plan:  Heparin bolus 4000 units IV x Heparin gtt 1200 units/hr Check an 8 hr heparin level Daily heparin level and CBC  Johnny [name], Rande Lawman 10/29/2018,1:53 PM

## 2018-10-30 DIAGNOSIS — I2510 Atherosclerotic heart disease of native coronary artery without angina pectoris: Secondary | ICD-10-CM

## 2018-10-30 DIAGNOSIS — Z791 Long term (current) use of non-steroidal anti-inflammatories (NSAID): Secondary | ICD-10-CM | POA: Diagnosis not present

## 2018-10-30 DIAGNOSIS — I471 Supraventricular tachycardia: Secondary | ICD-10-CM

## 2018-10-30 DIAGNOSIS — Z87891 Personal history of nicotine dependence: Secondary | ICD-10-CM | POA: Diagnosis not present

## 2018-10-30 DIAGNOSIS — I25119 Atherosclerotic heart disease of native coronary artery with unspecified angina pectoris: Secondary | ICD-10-CM | POA: Diagnosis not present

## 2018-10-30 DIAGNOSIS — Z79890 Hormone replacement therapy (postmenopausal): Secondary | ICD-10-CM | POA: Diagnosis not present

## 2018-10-30 DIAGNOSIS — I252 Old myocardial infarction: Secondary | ICD-10-CM | POA: Diagnosis not present

## 2018-10-30 DIAGNOSIS — Z79899 Other long term (current) drug therapy: Secondary | ICD-10-CM | POA: Diagnosis not present

## 2018-10-30 DIAGNOSIS — N183 Chronic kidney disease, stage 3 (moderate): Secondary | ICD-10-CM | POA: Diagnosis not present

## 2018-10-30 DIAGNOSIS — Z96652 Presence of left artificial knee joint: Secondary | ICD-10-CM | POA: Diagnosis not present

## 2018-10-30 DIAGNOSIS — Z7982 Long term (current) use of aspirin: Secondary | ICD-10-CM | POA: Diagnosis not present

## 2018-10-30 DIAGNOSIS — I129 Hypertensive chronic kidney disease with stage 1 through stage 4 chronic kidney disease, or unspecified chronic kidney disease: Secondary | ICD-10-CM | POA: Diagnosis not present

## 2018-10-30 DIAGNOSIS — Z955 Presence of coronary angioplasty implant and graft: Secondary | ICD-10-CM | POA: Diagnosis not present

## 2018-10-30 LAB — CBC
HCT: 43.5 % (ref 39.0–52.0)
HEMOGLOBIN: 14.7 g/dL (ref 13.0–17.0)
MCH: 30.9 pg (ref 26.0–34.0)
MCHC: 33.8 g/dL (ref 30.0–36.0)
MCV: 91.6 fL (ref 80.0–100.0)
Platelets: 220 10*3/uL (ref 150–400)
RBC: 4.75 MIL/uL (ref 4.22–5.81)
RDW: 12 % (ref 11.5–15.5)
WBC: 8.1 10*3/uL (ref 4.0–10.5)
nRBC: 0 % (ref 0.0–0.2)

## 2018-10-30 LAB — BASIC METABOLIC PANEL
Anion gap: 9 (ref 5–15)
BUN: 20 mg/dL (ref 8–23)
CALCIUM: 9 mg/dL (ref 8.9–10.3)
CO2: 22 mmol/L (ref 22–32)
Chloride: 107 mmol/L (ref 98–111)
Creatinine, Ser: 1.57 mg/dL — ABNORMAL HIGH (ref 0.61–1.24)
GFR calc Af Amer: 48 mL/min — ABNORMAL LOW (ref >60)
GFR calc non Af Amer: 41 mL/min — ABNORMAL LOW (ref >60)
Glucose, Bld: 101 mg/dL — ABNORMAL HIGH (ref 70–99)
Potassium: 3.8 mmol/L (ref 3.5–5.1)
Sodium: 138 mmol/L (ref 135–145)

## 2018-10-30 LAB — TROPONIN I: Troponin I: <0.03 ng/mL (ref <0.03)

## 2018-10-30 LAB — LIPID PANEL
Cholesterol: 133 mg/dL (ref 0–200)
HDL: 39 mg/dL — AB (ref >40)
LDL Cholesterol: 66 mg/dL (ref 0–99)
Total CHOL/HDL Ratio: 3.4 RATIO
Triglycerides: 142 mg/dL (ref <150)
VLDL: 28 mg/dL (ref 0–40)

## 2018-10-30 LAB — HEPARIN LEVEL (UNFRACTIONATED): Heparin Unfractionated: 0.6 IU/mL (ref 0.30–0.70)

## 2018-10-30 MED ORDER — METOPROLOL TARTRATE 50 MG PO TABS: 50.0000 mg | ORAL_TABLET | Freq: Two times a day (BID) | ORAL | 3 refills | Status: DC

## 2018-10-30 NOTE — Progress Notes (Signed)
Discharge instructions reviewed with pt and daughter. They do not have any questions at this time. Pt denies any pain and states he is ready for d/c.IV d/c.

## 2018-10-30 NOTE — Discharge Summary (Addendum)
Discharge Summary    Patient ID: Johnny [name] MRN: [id number]; DOB: 1941/07/22  Admit date: 10/29/2018 Discharge date: 10/30/2018  Primary Care Provider: Burnard Bunting, MD  Primary Cardiologist: Peter Martinique, MD  Primary Electrophysiologist:  None   Discharge Diagnoses    Angina, dyspnea with known CAD - VQ scan was negative for PE - Stent in proximal LAD 2005, patent in 2012 catheterization with distal 60% LAD stenosis. -Troponin negative x3.    H/O Paroxysmal supraventricular tachycardia - Currently having more episodes as an outpatient.  Metoprolol has controlled in the past.   -Day prior to admission experienced episode of fluttering in chest followed by a chest sensation.  More palpitations later that day led to an emergency room presentation.   Essential hypertension - Blood pressure under excellent control  Chronic kidney disease stage III - Baseline creatinine 1.79, lisinopril and hydrochlorothiazide are on hold  - Beta-blocker was increased.  Allergies No Known Allergies  Diagnostic Studies/Procedures    VQ scan 10/29/18 _____________   History of Present Illness     78 y/o male with known CAD and PSVT admitted through the ED with c/o SOB and chest discomfort.   Hospital Course      Pleasant 78 y/o male followed by Dr Martinique with a history of PSVT, CAD with stent to pLAD in 2005. Last cath 2012 with patent stent in pLAD, diffuse ectasia in mLAD dLAD diffuse non obstructive disease with 50-60% stenosis, LCX patent, RCA is small non dominant vessel, normal LV.  PSVT controlled in past with BB. other medical issues include  HTN, GERD, and HLD on statin.       The pt presented 10/29/18 with increased DOE and chest pressure, though he had trouble remembering his exact symptoms.  He did admit to having more "flutters" recently.  Short bursts, then on the day of admission it was a long burst. He was admitted to telemetry. Troponin was negative.  He was noted to have  renal insufficiency and his ACE/HCTZ was held.  It was suspected that his admission symptoms Bents have been from PSVT and his beta blocker was increased.  _____________  Discharge Vitals Blood pressure 117/62, pulse (!) 59, temperature 98 F (36.7 C), temperature source Oral, resp. rate 18, height 6' (1.829 m), weight 92.3 kg, SpO2 96 %.  Filed Weights   10/29/18 1148 10/30/18 0457  Weight: 95.3 kg 92.3 kg    Labs & Radiologic Studies    CBC Recent Labs    10/29/18 1153 10/30/18 0346  WBC 9.5 8.1  NEUTROABS 5.7  --   HGB 15.9 14.7  HCT 49.0 43.5  MCV 94.8 91.6  PLT 237 782   Basic Metabolic Panel Recent Labs    10/29/18 1153 10/29/18 1413 10/30/18 0346  NA 139  --  138  K 3.5  --  3.8  CL 103  --  107  CO2 25  --  22  GLUCOSE 119*  --  101*  BUN 21  --  20  CREATININE 1.79*  --  1.57*  CALCIUM 9.4  --  9.0  MG  --  2.4  --    Liver Function Tests Recent Labs    10/29/18 1413  AST 16  ALT 12  ALKPHOS 41  BILITOT 0.5  PROT 6.3*  ALBUMIN 3.5   No results for input(s): LIPASE, AMYLASE in the last 72 hours. Cardiac Enzymes Recent Labs    10/29/18 1413 10/29/18 2213 10/30/18 0346  TROPONINI <0.03 <0.03 <  0.03   BNP Invalid input(s): POCBNP D-Dimer Recent Labs    10/29/18 1153  DDIMER 0.56*   Hemoglobin A1C Recent Labs    10/29/18 1413  HGBA1C 5.0   Fasting Lipid Panel Recent Labs    10/30/18 0346  CHOL 133  HDL 39*  LDLCALC 66  TRIG 142  CHOLHDL 3.4   Thyroid Function Tests Recent Labs    10/29/18 1413  TSH 2.943   _____________  Nm Pulmonary Perf And Vent  Result Date: 10/29/2018 CLINICAL DATA:  Chest pain and shortness of Breath EXAM: NUCLEAR MEDICINE VENTILATION - PERFUSION LUNG SCAN TECHNIQUE: Ventilation images were obtained in multiple projections using inhaled aerosol Tc-87m DTPA. Perfusion images were obtained in multiple projections after intravenous injection of Tc-67m MAA. RADIOPHARMACEUTICALS:  32 mCi of Tc-69m DTPA  aerosol inhalation and 4.1 mCi Tc19m MAA IV COMPARISON:  Chest x-ray from earlier in the same day. FINDINGS: Ventilation: No focal ventilation defect. Perfusion: No wedge shaped peripheral perfusion defects to suggest acute pulmonary embolism. IMPRESSION: No ventilation perfusion mismatch to suggest pulmonary embolism. Electronically Signed   By: Inez Catalina M.D.   On: 10/29/2018 16:08   Dg Chest Port 1 View  Result Date: 10/29/2018 CLINICAL DATA:  Chest pain EXAM: PORTABLE CHEST 1 VIEW COMPARISON:  10/15/2011 FINDINGS: Cardiac shadows within normal limits. Thoracic aorta is tortuous. The lungs are well aerated bilaterally without focal infiltrate or sizable effusion. No acute bony abnormality is noted. IMPRESSION: No active disease. Electronically Signed   By: Inez Catalina M.D.   On: 10/29/2018 12:17   Disposition   Pt is being discharged home today in good condition.  Follow-up Plans & Appointments       Discharge Medications   Allergies as of 10/30/2018   No Known Allergies     Medication List    STOP taking these medications   lisinopril-hydrochlorothiazide 20-12.5 MG tablet Commonly known as:  ZESTORETIC     TAKE these medications   acetaminophen 650 MG CR tablet Commonly known as:  TYLENOL Take 650 mg by mouth 2 (two) times daily.   aspirin 81 MG tablet Take 81 mg by mouth daily.   atorvastatin 10 MG tablet Commonly known as:  LIPITOR TAKE 1 TABLET BY MOUTH EVERY DAY   clopidogrel 75 MG tablet Commonly known as:  PLAVIX TAKE 1 TABLET BY MOUTH EVERY DAY   diclofenac sodium 1 % Gel Commonly known as:  VOLTAREN Apply 2 g topically 4 (four) times daily. What changed:    when to take this  reasons to take this   hydrOXYzine 25 MG tablet Commonly known as:  ATARAX/VISTARIL Take 25 mg by mouth daily.   levothyroxine 25 MCG tablet Commonly known as:  SYNTHROID, LEVOTHROID Take 25 mcg by mouth daily.   meclizine 25 MG tablet Commonly known as:  ANTIVERT Take  25 mg by mouth 2 (two) times daily as needed for dizziness or nausea.   meloxicam 15 MG tablet Commonly known as:  MOBIC Take 15 mg by mouth daily.   metoprolol tartrate 50 MG tablet Commonly known as:  LOPRESSOR Take 1 tablet (50 mg total) by mouth 2 (two) times daily. What changed:    medication strength  how much to take   [place] Take 1 capsule by mouth 2 (two) times daily.   nitroGLYCERIN 0.4 MG SL tablet Commonly known as:  NITROSTAT Place 1 tablet (0.4 mg total) under the tongue every 5 (five) minutes as needed for chest  pain.   pantoprazole 40 MG tablet Commonly known as:  PROTONIX Take 1 tablet (40 mg total) by mouth 2 (two) times daily. NEED OV.   potassium chloride 10 MEQ tablet Commonly known as:  KLOR-CON M10 Take 1 tablet (10 mEq total) daily by mouth.   senna 8.6 MG Tabs tablet Commonly known as:  SENOKOT Take 1 tablet by mouth daily.   VITAMIN B-12 IJ Inject as directed every 30 (thirty) days.        Acute coronary syndrome (MI, NSTEMI, STEMI, etc) this admission?: No.    Outstanding Labs/Studies    Duration of Discharge Encounter   Greater than 30 minutes including physician time.  Signed, Kerin Ransom, PA-C 10/30/2018, 11:21 AM  Personally seen and examined. Agree with above.  Primary Cardiologist: Peter Martinique, MD  Subjective   Standing up, cleaning, teeth brushing.  Doing very well.  No syncope.  Feels well.  No chest pain, no shortness of breath.  Inpatient Medications    Scheduled Meds: . acetaminophen  650 mg Oral BID  . aspirin  324 mg Oral NOW   Or  . aspirin  300 mg Rectal NOW  . aspirin  81 mg Oral Daily  . aspirin EC  81 mg Oral Daily  . atorvastatin  10 mg Oral Daily  . clopidogrel  75 mg Oral Daily  . diclofenac sodium  2 g Topical QID  . hydrOXYzine  25 mg Oral Daily  . levothyroxine  25 mcg Oral Q0600  . meloxicam  15 mg Oral Daily  . metoprolol tartrate  50 mg Oral BID  .  pantoprazole  40 mg Oral BID  . senna  1 tablet Oral Daily   Continuous Infusions: . sodium chloride 10 mL/hr at 10/29/18 1437  . heparin 1,200 Units/hr (10/30/18 0847)   PRN Meds: acetaminophen, nitroGLYCERIN, nitroGLYCERIN, ondansetron (ZOFRAN) IV   Vital Signs          Vitals:   10/29/18 1757 10/29/18 2000 10/29/18 2150 10/30/18 0457  BP: (!) 108/91  134/69 (!) 119/102  Pulse: 70 72 68 (!) 59  Resp:      Temp: 97.6 F (36.4 C)  98.2 F (36.8 C) 98 F (36.7 C)  TempSrc: Oral  Oral Oral  SpO2: 97% 95% 96% 96%  Weight:    92.3 kg  Height:        Intake/Output Summary (Last 24 hours) at 10/30/2018 1029 Last data filed at 10/29/2018 1440    Gross per 24 hour  Intake 650 ml  Output -  Net 650 ml       Filed Weights   10/29/18 1148 10/30/18 0457  Weight: 95.3 kg 92.3 kg    Telemetry    Sinus rhythm, no SVT- Personally Reviewed  ECG    Sinus rhythm 60 interventricular conduction delay this morning-prior no ischemic changes noted, sinus rhythm, rare PVC- Personally Reviewed  Physical Exam   GEN:No acute distress.   Neck:No JVD Cardiac:RRR, 1/6 systolic murmur, no rubs, or gallops.  Respiratory:Clear to auscultation bilaterally. RW:ERXV, nontender, non-distended  MS:No edema; No deformity. Neuro:Nonfocal  Psych: Normal affect   Labs    Chemistry LastLabs       Recent Labs  Lab 10/29/18 1153 10/29/18 1413 10/30/18 0346  NA 139  --  138  K 3.5  --  3.8  CL 103  --  107  CO2 25  --  22  GLUCOSE 119*  --  101*  BUN 21  --  20  CREATININE 1.79*  --  1.57*  CALCIUM 9.4  --  9.0  PROT  --  6.3*  --   ALBUMIN  --  3.5  --   AST  --  16  --   ALT  --  12  --   ALKPHOS  --  41  --   BILITOT  --  0.5  --   GFRNONAA 35*  --  41*  GFRAA 41*  --  48*  ANIONGAP 11  --  9       Hematology LastLabs      Recent Labs  Lab 10/29/18 1153 10/30/18 0346  WBC 9.5 8.1  RBC 5.17 4.75  HGB 15.9 14.7  HCT 49.0 43.5   MCV 94.8 91.6  MCH 30.8 30.9  MCHC 32.4 33.8  RDW 11.9 12.0  PLT 237 220      Cardiac Enzymes LastLabs       Recent Labs  Lab 10/29/18 1413 10/29/18 2213 10/30/18 0346  TROPONINI <0.03 <0.03 <0.03      LastLabs     Recent Labs  Lab 10/29/18 1158  TROPIPOC 0.00       BNP LastLabs     Recent Labs  Lab 10/29/18 1413  BNP 56.0       DDimer  LastLabs     Recent Labs  Lab 10/29/18 1153  DDIMER 0.56*       Radiology     ImagingResults(Last48hours)  Nm Pulmonary Perf And Vent  Result Date: 10/29/2018 CLINICAL DATA:  Chest pain and shortness of Breath EXAM: NUCLEAR MEDICINE VENTILATION - PERFUSION LUNG SCAN TECHNIQUE: Ventilation images were obtained in multiple projections using inhaled aerosol Tc-27m DTPA. Perfusion images were obtained in multiple projections after intravenous injection of Tc-67m MAA. RADIOPHARMACEUTICALS:  32 mCi of Tc-40m DTPA aerosol inhalation and 4.1 mCi Tc48m MAA IV COMPARISON:  Chest x-ray from earlier in the same day. FINDINGS: Ventilation: No focal ventilation defect. Perfusion: No wedge shaped peripheral perfusion defects to suggest acute pulmonary embolism. IMPRESSION: No ventilation perfusion mismatch to suggest pulmonary embolism. Electronically Signed   By: Inez Catalina M.D.   On: 10/29/2018 16:08   Dg Chest Port 1 View  Result Date: 10/29/2018 CLINICAL DATA:  Chest pain EXAM: PORTABLE CHEST 1 VIEW COMPARISON:  10/15/2011 FINDINGS: Cardiac shadows within normal limits. Thoracic aorta is tortuous. The lungs are well aerated bilaterally without focal infiltrate or sizable effusion. No acute bony abnormality is noted. IMPRESSION: No active disease. Electronically Signed   By: Inez Catalina M.D.   On: 10/29/2018 12:17     Cardiac Studies   No new study  Patient Profile     78 y.o. male with chest sensation in the setting of palpitations, possible SVT with negative VQ scan, negative troponin.  Assessment  & Plan    Angina, dyspnea with known CAD - VQ scan was negative for PE - Stent in proximal LAD 2005, patent in 2012 catheterization with distal 60% LAD stenosis. -Troponin negative x3.  BNP 56.  Creatinine 1.57 this morning.  Potassium 3.8.  LDL 66.  Hemoglobin 14.7.  TSH and free T4 normal. -On chronic dual antiplatelet therapy with aspirin and Plavix per Dr. Martinique.  Prior Cypher stent in proximal LAD 2005. - Continue with medical management of CAD.  Paroxysmal supraventricular tachycardia - Currently having more episodes as an outpatient.  Metoprolol has controlled in the past.  No lightheadedness with his tachycardia, no syncope. -Day prior to admission experienced episode of fluttering in chest  followed by a chest sensation.  More palpitations later that day led to an emergency room presentation.  Sometimes he feels as though he cannot get enough air.  Perhaps recent increase in sweet tea caffeine consumption caused more tachycardia. -Increased metoprolol to 50 mg twice a day as blood pressure allows.  Essential hypertension - Blood pressure under excellent control  Chronic kidney disease stage III - Baseline creatinine 1.79, lisinopril and hydrochlorothiazide are on hold  - Beta-blocker was increased.  Avoid drinking caffeinated beverages.  However, stay hydrated.  I am okay with discharge.  I would continue to hold his lisinopril hydrochlorothiazide given his blood pressures. Once again, we increased his metoprolol to 50 twice a day.  This will hopefully help his palpitations.    For questions or updates, please contact [place] Please consult [email or web address] for contact info under        Signed, Candee Furbish, MD

## 2018-10-30 NOTE — Progress Notes (Addendum)
Progress Note  Patient Name: Johnny [name] Date of Encounter: 10/30/2018  Primary Cardiologist: Peter Martinique, MD  Subjective   Standing up, cleaning, teeth brushing.  Doing very well.  No syncope.  Feels well.  No chest pain, no shortness of breath.  Inpatient Medications    Scheduled Meds: . acetaminophen  650 mg Oral BID  . aspirin  324 mg Oral NOW   Or  . aspirin  300 mg Rectal NOW  . aspirin  81 mg Oral Daily  . aspirin EC  81 mg Oral Daily  . atorvastatin  10 mg Oral Daily  . clopidogrel  75 mg Oral Daily  . diclofenac sodium  2 g Topical QID  . hydrOXYzine  25 mg Oral Daily  . levothyroxine  25 mcg Oral Q0600  . meloxicam  15 mg Oral Daily  . metoprolol tartrate  50 mg Oral BID  . pantoprazole  40 mg Oral BID  . senna  1 tablet Oral Daily   Continuous Infusions: . sodium chloride 10 mL/hr at 10/29/18 1437  . heparin 1,200 Units/hr (10/30/18 0847)   PRN Meds: acetaminophen, nitroGLYCERIN, nitroGLYCERIN, ondansetron (ZOFRAN) IV   Vital Signs    Vitals:   10/29/18 1757 10/29/18 2000 10/29/18 2150 10/30/18 0457  BP: (!) 108/91  134/69 (!) 119/102  Pulse: 70 72 68 (!) 59  Resp:      Temp: 97.6 F (36.4 C)  98.2 F (36.8 C) 98 F (36.7 C)  TempSrc: Oral  Oral Oral  SpO2: 97% 95% 96% 96%  Weight:    92.3 kg  Height:        Intake/Output Summary (Last 24 hours) at 10/30/2018 1029 Last data filed at 10/29/2018 1440 Gross per 24 hour  Intake 650 ml  Output -  Net 650 ml   Filed Weights   10/29/18 1148 10/30/18 0457  Weight: 95.3 kg 92.3 kg    Telemetry    Sinus rhythm, no SVT- Personally Reviewed  ECG    Sinus rhythm 60 interventricular conduction delay this morning-prior no ischemic changes noted, sinus rhythm, rare PVC- Personally Reviewed  Physical Exam   GEN: No acute distress.   Neck: No JVD Cardiac: RRR, 1/6 systolic murmur, no rubs, or gallops.  Respiratory: Clear to auscultation bilaterally. GI: Soft, nontender, non-distended  MS: No  edema; No deformity. Neuro:  Nonfocal  Psych: Normal affect   Labs    Chemistry Recent Labs  Lab 10/29/18 1153 10/29/18 1413 10/30/18 0346  NA 139  --  138  K 3.5  --  3.8  CL 103  --  107  CO2 25  --  22  GLUCOSE 119*  --  101*  BUN 21  --  20  CREATININE 1.79*  --  1.57*  CALCIUM 9.4  --  9.0  PROT  --  6.3*  --   ALBUMIN  --  3.5  --   AST  --  16  --   ALT  --  12  --   ALKPHOS  --  41  --   BILITOT  --  0.5  --   GFRNONAA 35*  --  41*  GFRAA 41*  --  48*  ANIONGAP 11  --  9     Hematology Recent Labs  Lab 10/29/18 1153 10/30/18 0346  WBC 9.5 8.1  RBC 5.17 4.75  HGB 15.9 14.7  HCT 49.0 43.5  MCV 94.8 91.6  MCH 30.8 30.9  MCHC 32.4 33.8  RDW 11.9 12.0  PLT 237 220    Cardiac Enzymes Recent Labs  Lab 10/29/18 1413 10/29/18 2213 10/30/18 0346  TROPONINI <0.03 <0.03 <0.03    Recent Labs  Lab 10/29/18 1158  TROPIPOC 0.00     BNP Recent Labs  Lab 10/29/18 1413  BNP 56.0     DDimer  Recent Labs  Lab 10/29/18 1153  DDIMER 0.56*     Radiology    Nm Pulmonary Perf And Vent  Result Date: 10/29/2018 CLINICAL DATA:  Chest pain and shortness of Breath EXAM: NUCLEAR MEDICINE VENTILATION - PERFUSION LUNG SCAN TECHNIQUE: Ventilation images were obtained in multiple projections using inhaled aerosol Tc-78m DTPA. Perfusion images were obtained in multiple projections after intravenous injection of Tc-75m MAA. RADIOPHARMACEUTICALS:  32 mCi of Tc-2m DTPA aerosol inhalation and 4.1 mCi Tc14m MAA IV COMPARISON:  Chest x-ray from earlier in the same day. FINDINGS: Ventilation: No focal ventilation defect. Perfusion: No wedge shaped peripheral perfusion defects to suggest acute pulmonary embolism. IMPRESSION: No ventilation perfusion mismatch to suggest pulmonary embolism. Electronically Signed   By: Inez Catalina M.D.   On: 10/29/2018 16:08   Dg Chest Port 1 View  Result Date: 10/29/2018 CLINICAL DATA:  Chest pain EXAM: PORTABLE CHEST 1 VIEW COMPARISON:   10/15/2011 FINDINGS: Cardiac shadows within normal limits. Thoracic aorta is tortuous. The lungs are well aerated bilaterally without focal infiltrate or sizable effusion. No acute bony abnormality is noted. IMPRESSION: No active disease. Electronically Signed   By: Inez Catalina M.D.   On: 10/29/2018 12:17    Cardiac Studies   No new study  Patient Profile     78 y.o. male with chest sensation in the setting of palpitations, possible SVT with negative VQ scan, negative troponin.  Assessment & Plan    Angina, dyspnea with known CAD - VQ scan was negative for PE - Stent in proximal LAD 2005, patent in 2012 catheterization with distal 60% LAD stenosis. -Troponin negative x3.  BNP 56.  Creatinine 1.57 this morning.  Potassium 3.8.  LDL 66.  Hemoglobin 14.7.  TSH and free T4 normal. -On chronic dual antiplatelet therapy with aspirin and Plavix per Dr. Martinique.  Prior Cypher stent in proximal LAD 2005. - Continue with medical management of CAD.  Paroxysmal supraventricular tachycardia - Currently having more episodes as an outpatient.  Metoprolol has controlled in the past.  No lightheadedness with his tachycardia, no syncope. -Day prior to admission experienced episode of fluttering in chest followed by a chest sensation.  More palpitations later that day led to an emergency room presentation.  Sometimes he feels as though he cannot get enough air.  Perhaps recent increase in sweet tea caffeine consumption caused more tachycardia. -Increased metoprolol to 50 mg twice a day as blood pressure allows.  Essential hypertension - Blood pressure under excellent control  Chronic kidney disease stage III - Baseline creatinine 1.79, lisinopril and hydrochlorothiazide are on hold  - Beta-blocker was increased.  Avoid drinking caffeinated beverages.  However, stay hydrated.  I am okay with discharge.  I would continue to hold his lisinopril hydrochlorothiazide given his blood pressures. Once again,  we increased his metoprolol to 50 twice a day.  This will hopefully help his palpitations.    For questions or updates, please contact [place] Please consult [email or web address] for contact info under        Signed, Candee Furbish, MD  10/30/2018, 10:29 AM

## 2018-10-30 NOTE — Care Management Obs Status (Signed)
[place] NOTIFICATION   Patient Details  Name: Johnny [name] MRN: [id number] Date of Birth: [birth year]   Medicare Observation Status Notification Given:  Yes    Royston Bake, RN 10/30/2018, 12:55 PM

## 2018-10-30 NOTE — Care Management CC44 (Signed)
Condition Code 44 Documentation Completed  Patient Details  Name: Johnny [name] MRN: [id number] Date of Birth: 03-25-1941   Condition Code 44 given:  Yes Patient signature on Condition Code 44 notice:    Documentation of 2 MD's agreement:  Yes Code 44 added to claim:  Yes    Royston Bake, RN 10/30/2018, 12:55 PM

## 2018-11-01 DIAGNOSIS — R262 Difficulty in walking, not elsewhere classified: Secondary | ICD-10-CM | POA: Diagnosis not present

## 2018-11-01 DIAGNOSIS — M629 Disorder of muscle, unspecified: Secondary | ICD-10-CM | POA: Diagnosis not present

## 2018-11-01 DIAGNOSIS — M25562 Pain in left knee: Secondary | ICD-10-CM | POA: Diagnosis not present

## 2018-11-01 DIAGNOSIS — M25552 Pain in left hip: Secondary | ICD-10-CM | POA: Diagnosis not present

## 2018-11-02 NOTE — H&P (View-Only) (Signed)
Johnny [name] Date of Birth: January 11, 1941   History of Present Illness: Johnny [name] is seen for post hospital follow up.  He has a history of supraventricular tachycardia. He is s/p stenting of the proximal LAD in 2005 with a Cypher stent. He is on chronic DAPT. He had repeat caths in 2010 and Dec. 2012 with nonobstructive disease.   He was admitted 1/3-10/30/18 with complaints of dyspnea, chest pressure and a "fluttering" sensation. Troponin levels were negative x 3. V/Q scan was low probability for PE. Creatinine was elevated to 1.79. ACEi and diuretic were held. Metoprolol increased for fluttering. There was some consideration for cardiac cath but this was deferred due to elevated creatinine.   On follow up today he reports he still does not feel well. He has persistent anterior chest pressure and tightness like he is wearing a seatbelt that is too tight. BP has been running higher. He has no energy and complains of SOB. He also complains of pain in his left hip. Has seen 2 different orthopedics. Given injection and has done PT but without improvement. This is significantly impacting his ability to function.   Current Outpatient Medications on File Prior to Visit  Medication Sig Dispense Refill  . acetaminophen (TYLENOL) 650 MG CR tablet Take 650 mg by mouth 2 (two) times daily.    [name] aspirin 81 MG tablet Take 81 mg by mouth daily.      [name] atorvastatin (LIPITOR) 10 MG tablet TAKE 1 TABLET BY MOUTH EVERY DAY (Patient taking differently: Take 10 mg by mouth daily. ) 90 tablet 0  . clopidogrel (PLAVIX) 75 MG tablet TAKE 1 TABLET BY MOUTH EVERY DAY (Patient taking differently: Take 75 mg by mouth daily. ) 90 tablet 1  . Cyanocobalamin (VITAMIN B-12 IJ) Inject as directed every 30 (thirty) days.     . diclofenac sodium (VOLTAREN) 1 % GEL Apply 2 g topically 4 (four) times daily. (Patient taking differently: Apply 2 g topically 4 (four) times daily as needed (hip pain). ) 1 Tube 5  . Glucos-Chond-Hyal Ac-Ca  Fructo (MOVE FREE JOINT HEALTH ADVANCE PO) Take 1 capsule by mouth 2 (two) times daily.    . hydrOXYzine (ATARAX) 25 MG tablet Take 25 mg by mouth daily.      [name] levothyroxine (SYNTHROID, LEVOTHROID) 25 MCG tablet Take 25 mcg by mouth daily.      . meclizine (ANTIVERT) 25 MG tablet Take 25 mg by mouth 2 (two) times daily as needed for dizziness or nausea.     . meloxicam (MOBIC) 15 MG tablet Take 15 mg by mouth daily.     . metoprolol tartrate (LOPRESSOR) 50 MG tablet Take 1 tablet (50 mg total) by mouth 2 (two) times daily. 180 tablet 3  . nitroGLYCERIN (NITROSTAT) 0.4 MG SL tablet Place 1 tablet (0.4 mg total) under the tongue every 5 (five) minutes as needed for chest pain. 1 tablet 0  . pantoprazole (PROTONIX) 40 MG tablet Take 1 tablet (40 mg total) by mouth 2 (two) times daily. NEED OV. 180 tablet 0  . potassium chloride (KLOR-CON M10) 10 MEQ tablet Take 1 tablet (10 mEq total) daily by mouth. 90 tablet 3  . senna (SENOKOT) 8.6 MG TABS tablet Take 1 tablet by mouth daily.     No current facility-administered medications on file prior to visit.     No Known Allergies  Past Medical History:  Diagnosis Date  . Acromioclavicular joint arthritis   . Anemia   .  B12 deficiency 10/15/11   "take injections q 28 days"  . Coronary artery disease    LHC 10/15/11: LAD stent patent with mid ectasia, distal LAD 50-60%, mid circumflex with ectasia, EF 55-65%.  . Dyslipidemia   . GERD (gastroesophageal reflux disease)   . Glucose intolerance (pre-diabetes)    Borderline glucose intolerance  . Hypertension   . Hypothyroidism   . Post concussive syndrome 2011   "for ~ 6-8 months"  . Rotator cuff tear   . Shortness of breath 10/15/11   "here lately I've been having it on & off any time"  . SVT (supraventricular tachycardia) ([place])    maintained on beta blocker    Past Surgical History:  Procedure Laterality Date  . BACK SURGERY    . CARDIAC CATHETERIZATION  12/19/08   NORMAL. EF 55%  .  CARDIAC CATHETERIZATION  10/15/11  . CARDIOVASCULAR STRESS TEST  10/2010  . CARPAL TUNNEL RELEASE     bilaterally  . COLONOSCOPY W/ ENDOSCOPIC [place]    . CORONARY ANGIOPLASTY WITH STENT PLACEMENT  9/ 2005   . JOINT REPLACEMENT    . LEFT HEART CATHETERIZATION WITH CORONARY ANGIOGRAM N/A 10/15/2011   Procedure: LEFT HEART CATHETERIZATION WITH CORONARY ANGIOGRAM;  Surgeon: Sherren Mocha, MD;  Location: [facility] CATH LAB;  Service: Cardiovascular;  Laterality: N/A;  . LUMBAR LAMINECTOMY  1970's  . NASAL SINUS SURGERY     x2  . reconstructive shoulder surgery     right  . REPLACEMENT TOTAL KNEE  12/2007   left  . RESECTION TUMOR CLAVICLE RADICAL     Open distal clavicle resection  . SHOULDER SURGERY  02/11/2011   right  . TRIGGER FINGER RELEASE     ? both hands    Social History   Tobacco Use  Smoking Status Former Smoker  . Packs/day: 1.00  . Years: 21.00  . Pack years: 21.00  . Types: Cigarettes  . Last attempt to quit: 04/07/1976  . Years since quitting: 42.6  Smokeless Tobacco Never Used    Social History   Substance and Sexual Activity  Alcohol Use Yes   Comment: 10/15/11 "haven't drank in 15-20 years"    Family History  Problem Relation Age of Onset  . Stroke Mother   . Heart attack Father   . Diabetes Father   . Kidney failure Father     Review of Systems: As noted in history of present illness.  All other systems were reviewed and are negative.  Physical Exam: BP (!) 158/78 (BP Location: Left Arm, Patient Position: Sitting, Cuff Size: Large)   Pulse 62   Ht 6' (1.829 m)   Wt 209 lb (94.8 kg)   BMI 28.35 kg/m  GENERAL:  Well appearing WM in NAD HEENT:  PERRL, EOMI, sclera are clear. Oropharynx is clear. NECK:  No jugular venous distention, carotid upstroke brisk and symmetric, no bruits, no thyromegaly or adenopathy LUNGS:  Clear to auscultation bilaterally CHEST:  Unremarkable HEART:  RRR,  PMI not displaced or sustained,S1 and S2 within normal limits, no S3,  no S4: no clicks, no rubs, no murmurs ABD:  Soft, nontender. BS +, no masses or bruits. No hepatomegaly, no splenomegaly EXT:  2 + pulses throughout, no edema, no cyanosis no clubbing. Arthritic changes in hands. SKIN:  Warm and dry.  No rashes NEURO:  Alert and oriented x 3. Cranial nerves II through XII intact. PSYCH:  Cognitively intact    LABORATORY DATA:  Lab Results  Component Value Date  WBC 8.1 10/30/2018   HGB 14.7 10/30/2018   HCT 43.5 10/30/2018   PLT 220 10/30/2018   GLUCOSE 101 (H) 10/30/2018   CHOL 133 10/30/2018   TRIG 142 10/30/2018   HDL 39 (L) 10/30/2018   LDLCALC 66 10/30/2018   ALT 12 10/29/2018   AST 16 10/29/2018   NA 138 10/30/2018   K 3.8 10/30/2018   CL 107 10/30/2018   CREATININE 1.57 (H) 10/30/2018   BUN 20 10/30/2018   CO2 22 10/30/2018   TSH 2.943 10/29/2018   INR 0.96 10/15/2011   HGBA1C 5.0 10/29/2018    Reviewed from primary care dated 05/14/16: cholesterol 141, triglycerides 174, HDL39, LDL 67. BUN 23, creatinine 1.5. Other chemistries, Hgb, and TSH were normal.   Assessment / Plan: 1. Coronary disease. Now with persistent symptoms of chest pressure, SOB and fatigue. S/p remote stenting of the proximal LAD with Cypher stent in 2005. Cardiac catheterization December 2012 showed nonobstructive disease. His last stress test in December of 2011 was normal. I think we need to proceed with definitive evaluation with cardiac cath. Renal parameters were improving and we will check again today. Will hydrate prior. Plan procedure on Friday Jan 10. The procedure and risks were reviewed including but not limited to death, myocardial infarction, stroke, arrythmias, bleeding, transfusion, emergency surgery, dye allergy, or renal dysfunction. The patient voices understanding and is agreeable to proceed..   2. Dyslipidemia LDL at goal. 66 on statin  3. Hypertension. If renal function improved will consider resuming ACEi post cath  4. SVT controlled on  metoprolol  5. Left hip pain. Will need follow up with ortho given lack of improvement with conservative therapy.

## 2018-11-02 NOTE — Progress Notes (Signed)
Jarryd M Lukin Date of Birth: 1941-01-06   History of Present Illness: [name] is seen for post hospital follow up.  He has a history of supraventricular tachycardia. He is s/p stenting of the proximal LAD in 2005 with a Cypher stent. He is on chronic DAPT. He had repeat caths in 2010 and Dec. 2012 with nonobstructive disease.   He was admitted 1/3-10/30/18 with complaints of dyspnea, chest pressure and a "fluttering" sensation. Troponin levels were negative x 3. V/Q scan was low probability for PE. Creatinine was elevated to 1.79. ACEi and diuretic were held. Metoprolol increased for fluttering. There was some consideration for cardiac cath but this was deferred due to elevated creatinine.   On follow up today he reports he still does not feel well. He has persistent anterior chest pressure and tightness like he is wearing a seatbelt that is too tight. BP has been running higher. He has no energy and complains of SOB. He also complains of pain in his left hip. Has seen 2 different orthopedics. Given injection and has done PT but without improvement. This is significantly impacting his ability to function.   Current Outpatient Medications on File Prior to Visit  Medication Sig Dispense Refill  . acetaminophen (TYLENOL) 650 MG CR tablet Take 650 mg by mouth 2 (two) times daily.    [name] aspirin 81 MG tablet Take 81 mg by mouth daily.      [name] atorvastatin (LIPITOR) 10 MG tablet TAKE 1 TABLET BY MOUTH EVERY DAY (Patient taking differently: Take 10 mg by mouth daily. ) 90 tablet 0  . clopidogrel (PLAVIX) 75 MG tablet TAKE 1 TABLET BY MOUTH EVERY DAY (Patient taking differently: Take 75 mg by mouth daily. ) 90 tablet 1  . Cyanocobalamin (VITAMIN B-12 IJ) Inject as directed every 30 (thirty) days.     . diclofenac sodium (VOLTAREN) 1 % GEL Apply 2 g topically 4 (four) times daily. (Patient taking differently: Apply 2 g topically 4 (four) times daily as needed (hip pain). ) 1 Tube 5  . Glucos-Chond-Hyal Ac-Ca  Fructo (MOVE FREE JOINT HEALTH ADVANCE PO) Take 1 capsule by mouth 2 (two) times daily.    . hydrOXYzine (ATARAX) 25 MG tablet Take 25 mg by mouth daily.      [name] levothyroxine (SYNTHROID, LEVOTHROID) 25 MCG tablet Take 25 mcg by mouth daily.      . meclizine (ANTIVERT) 25 MG tablet Take 25 mg by mouth 2 (two) times daily as needed for dizziness or nausea.     . meloxicam (MOBIC) 15 MG tablet Take 15 mg by mouth daily.     . metoprolol tartrate (LOPRESSOR) 50 MG tablet Take 1 tablet (50 mg total) by mouth 2 (two) times daily. 180 tablet 3  . nitroGLYCERIN (NITROSTAT) 0.4 MG SL tablet Place 1 tablet (0.4 mg total) under the tongue every 5 (five) minutes as needed for chest pain. 1 tablet 0  . pantoprazole (PROTONIX) 40 MG tablet Take 1 tablet (40 mg total) by mouth 2 (two) times daily. NEED OV. 180 tablet 0  . potassium chloride (KLOR-CON M10) 10 MEQ tablet Take 1 tablet (10 mEq total) daily by mouth. 90 tablet 3  . senna (SENOKOT) 8.6 MG TABS tablet Take 1 tablet by mouth daily.     No current facility-administered medications on file prior to visit.     No Known Allergies  Past Medical History:  Diagnosis Date  . Acromioclavicular joint arthritis   . Anemia   .  B12 deficiency 10/15/11   "take injections q 28 days"  . Coronary artery disease    LHC 10/15/11: LAD stent patent with mid ectasia, distal LAD 50-60%, mid circumflex with ectasia, EF 55-65%.  . Dyslipidemia   . GERD (gastroesophageal reflux disease)   . Glucose intolerance (pre-diabetes)    Borderline glucose intolerance  . Hypertension   . Hypothyroidism   . Post concussive syndrome 2011   "for ~ 6-8 months"  . Rotator cuff tear   . Shortness of breath 10/15/11   "here lately I've been having it on & off any time"  . SVT (supraventricular tachycardia) ([place])    maintained on beta blocker    Past Surgical History:  Procedure Laterality Date  . BACK SURGERY    . CARDIAC CATHETERIZATION  12/19/08   NORMAL. EF 55%  .  CARDIAC CATHETERIZATION  10/15/11  . CARDIOVASCULAR STRESS TEST  10/2010  . CARPAL TUNNEL RELEASE     bilaterally  . COLONOSCOPY W/ ENDOSCOPIC [place]    . CORONARY ANGIOPLASTY WITH STENT PLACEMENT  9/ 2005   . JOINT REPLACEMENT    . LEFT HEART CATHETERIZATION WITH CORONARY ANGIOGRAM N/A 10/15/2011   Procedure: LEFT HEART CATHETERIZATION WITH CORONARY ANGIOGRAM;  Surgeon: Sherren Mocha, MD;  Location: [facility] CATH LAB;  Service: Cardiovascular;  Laterality: N/A;  . LUMBAR LAMINECTOMY  1970's  . NASAL SINUS SURGERY     x2  . reconstructive shoulder surgery     right  . REPLACEMENT TOTAL KNEE  12/2007   left  . RESECTION TUMOR CLAVICLE RADICAL     Open distal clavicle resection  . SHOULDER SURGERY  02/11/2011   right  . TRIGGER FINGER RELEASE     ? both hands    Social History   Tobacco Use  Smoking Status Former Smoker  . Packs/day: 1.00  . Years: 21.00  . Pack years: 21.00  . Types: Cigarettes  . Last attempt to quit: 04/07/1976  . Years since quitting: 42.6  Smokeless Tobacco Never Used    Social History   Substance and Sexual Activity  Alcohol Use Yes   Comment: 10/15/11 "haven't drank in 15-20 years"    Family History  Problem Relation Age of Onset  . Stroke Mother   . Heart attack Father   . Diabetes Father   . Kidney failure Father     Review of Systems: As noted in history of present illness.  All other systems were reviewed and are negative.  Physical Exam: BP (!) 158/78 (BP Location: Left Arm, Patient Position: Sitting, Cuff Size: Large)   Pulse 62   Ht 6' (1.829 m)   Wt 209 lb (94.8 kg)   BMI 28.35 kg/m  GENERAL:  Well appearing WM in NAD HEENT:  PERRL, EOMI, sclera are clear. Oropharynx is clear. NECK:  No jugular venous distention, carotid upstroke brisk and symmetric, no bruits, no thyromegaly or adenopathy LUNGS:  Clear to auscultation bilaterally CHEST:  Unremarkable HEART:  RRR,  PMI not displaced or sustained,S1 and S2 within normal limits, no S3,  no S4: no clicks, no rubs, no murmurs ABD:  Soft, nontender. BS +, no masses or bruits. No hepatomegaly, no splenomegaly EXT:  2 + pulses throughout, no edema, no cyanosis no clubbing. Arthritic changes in hands. SKIN:  Warm and dry.  No rashes NEURO:  Alert and oriented x 3. Cranial nerves II through XII intact. PSYCH:  Cognitively intact    LABORATORY DATA:  Lab Results  Component Value Date  WBC 8.1 10/30/2018   HGB 14.7 10/30/2018   HCT 43.5 10/30/2018   PLT 220 10/30/2018   GLUCOSE 101 (H) 10/30/2018   CHOL 133 10/30/2018   TRIG 142 10/30/2018   HDL 39 (L) 10/30/2018   LDLCALC 66 10/30/2018   ALT 12 10/29/2018   AST 16 10/29/2018   NA 138 10/30/2018   K 3.8 10/30/2018   CL 107 10/30/2018   CREATININE 1.57 (H) 10/30/2018   BUN 20 10/30/2018   CO2 22 10/30/2018   TSH 2.943 10/29/2018   INR 0.96 10/15/2011   HGBA1C 5.0 10/29/2018    Reviewed from primary care dated 05/14/16: cholesterol 141, triglycerides 174, HDL39, LDL 67. BUN 23, creatinine 1.5. Other chemistries, Hgb, and TSH were normal.   Assessment / Plan: 1. Coronary disease. Now with persistent symptoms of chest pressure, SOB and fatigue. S/p remote stenting of the proximal LAD with Cypher stent in 2005. Cardiac catheterization December 2012 showed nonobstructive disease. His last stress test in December of 2011 was normal. I think we need to proceed with definitive evaluation with cardiac cath. Renal parameters were improving and we will check again today. Will hydrate prior. Plan procedure on Friday Jan 10. The procedure and risks were reviewed including but not limited to death, myocardial infarction, stroke, arrythmias, bleeding, transfusion, emergency surgery, dye allergy, or renal dysfunction. The patient voices understanding and is agreeable to proceed..   2. Dyslipidemia LDL at goal. 66 on statin  3. Hypertension. If renal function improved will consider resuming ACEi post cath  4. SVT controlled on  metoprolol  5. Left hip pain. Will need follow up with ortho given lack of improvement with conservative therapy.

## 2018-11-03 ENCOUNTER — Ambulatory Visit (INDEPENDENT_AMBULATORY_CARE_PROVIDER_SITE_OTHER): Payer: Medicare Other | Admitting: Cardiology

## 2018-11-03 ENCOUNTER — Encounter: Payer: Self-pay | Admitting: Cardiology

## 2018-11-03 ENCOUNTER — Other Ambulatory Visit: Payer: Self-pay | Admitting: Cardiology

## 2018-11-03 VITALS — BP 158/78 | HR 62 | Ht 72.0 in | Wt 209.0 lb

## 2018-11-03 DIAGNOSIS — D510 Vitamin B12 deficiency anemia due to intrinsic factor deficiency: Secondary | ICD-10-CM | POA: Diagnosis not present

## 2018-11-03 DIAGNOSIS — I25110 Atherosclerotic heart disease of native coronary artery with unstable angina pectoris: Secondary | ICD-10-CM | POA: Diagnosis not present

## 2018-11-03 DIAGNOSIS — I10 Essential (primary) hypertension: Secondary | ICD-10-CM | POA: Diagnosis not present

## 2018-11-03 DIAGNOSIS — R079 Chest pain, unspecified: Secondary | ICD-10-CM | POA: Diagnosis not present

## 2018-11-03 DIAGNOSIS — I471 Supraventricular tachycardia: Secondary | ICD-10-CM

## 2018-11-03 DIAGNOSIS — I2510 Atherosclerotic heart disease of native coronary artery without angina pectoris: Secondary | ICD-10-CM | POA: Diagnosis not present

## 2018-11-03 DIAGNOSIS — N183 Chronic kidney disease, stage 3 unspecified: Secondary | ICD-10-CM

## 2018-11-03 DIAGNOSIS — I200 Unstable angina: Secondary | ICD-10-CM

## 2018-11-03 NOTE — Patient Instructions (Signed)
    [facility] [facility] [address] [place] [address] Dept: [phone] Loc: [place] [facility]  11/03/2018  You are scheduled for a Cardiac Cath on Friday 11/05/18 with Dr. Martinique.  1. Please arrive at the [facility] (Main Entrance A) at [facility] [address] [place], [place] [address] at 8:30 am (This time is two hours before your procedure to ensure your preparation). Free valet parking service is available.   Special note: Every effort is made to have your procedure done on time. Please understand that emergencies sometimes delay scheduled procedures.  2. Diet: Do not eat solid foods after midnight.  The patient Wigley have clear liquids until 5am upon the day of the procedure.  3. Labs: You will need to have blood drawn ( bmet,cbc ) on Wed 11/03/18. You do not need to be fasting.  4. Medication instructions in preparation for your procedure:    On the morning of your procedure, take your Aspirin 81 mg and Plavix 75 mg and any morning medicines NOT listed above.  You Zeck use sips of water.  5. Plan for one night stay--bring personal belongings. 6. Bring a current list of your medications and current insurance cards. 7. You MUST have a responsible person to drive you home. 8. Someone MUST be with you the first 24 hours after you arrive home or your discharge will be delayed. 9. Please wear clothes that are easy to get on and off and wear slip-on shoes.  Thank you for allowing [place] to care for you!   --  Invasive Cardiovascular services

## 2018-11-04 ENCOUNTER — Telehealth: Payer: Self-pay | Admitting: *Deleted

## 2018-11-04 LAB — BASIC METABOLIC PANEL
BUN/Creatinine Ratio: 18 (ref 10–24)
BUN: 25 mg/dL (ref 8–27)
CO2: 20 mmol/L (ref 20–29)
Calcium: 9.8 mg/dL (ref 8.6–10.2)
Chloride: 105 mmol/L (ref 96–106)
Creatinine, Ser: 1.4 mg/dL — ABNORMAL HIGH (ref 0.76–1.27)
GFR calc Af Amer: 55 mL/min/{1.73_m2} — ABNORMAL LOW (ref >59)
GFR, EST NON AFRICAN AMERICAN: 47 mL/min/{1.73_m2} — AB (ref >59)
Glucose: 106 mg/dL — ABNORMAL HIGH (ref 65–99)
POTASSIUM: 4.6 mmol/L (ref 3.5–5.2)
Sodium: 142 mmol/L (ref 134–144)

## 2018-11-04 LAB — CBC WITH DIFFERENTIAL/PLATELET
Basophils Absolute: 0.1 10*3/uL (ref 0.0–0.2)
Basos: 1 %
EOS (ABSOLUTE): 0.3 10*3/uL (ref 0.0–0.4)
EOS: 3 %
Hematocrit: 45.1 % (ref 37.5–51.0)
Hemoglobin: 15.3 g/dL (ref 13.0–17.7)
Immature Grans (Abs): 0 10*3/uL (ref 0.0–0.1)
Immature Granulocytes: 0 %
Lymphocytes Absolute: 1.9 10*3/uL (ref 0.7–3.1)
Lymphs: 24 %
MCH: 30.7 pg (ref 26.6–33.0)
MCHC: 33.9 g/dL (ref 31.5–35.7)
MCV: 90 fL (ref 79–97)
Monocytes Absolute: 0.9 10*3/uL (ref 0.1–0.9)
Monocytes: 11 %
Neutrophils Absolute: 4.7 10*3/uL (ref 1.4–7.0)
Neutrophils: 61 %
Platelets: 248 10*3/uL (ref 150–450)
RBC: 4.99 x10E6/uL (ref 4.14–5.80)
RDW: 12.2 % (ref 11.6–15.4)
WBC: 7.9 10*3/uL (ref 3.4–10.8)

## 2018-11-04 NOTE — Telephone Encounter (Signed)
Pt contacted pre-catheterization scheduled at [facility] for: Friday November 05, 2018 10:30 AM Verified arrival time and place: [place] Entrance A at: 8 AM  No solid food after midnight prior to cath, clear liquids until 5 AM day of procedure.  Hold: Mobic-day before and day of procedure-GFR 47  Except hold medications AM meds can be  taken pre-cath with sip of water including: ASA 81 mg Clopidogrel 75 mg   Confirmed patient has responsible person to drive home post procedure and for 24 hours after you arrive home: yes

## 2018-11-05 ENCOUNTER — Ambulatory Visit (HOSPITAL_COMMUNITY)
Admission: RE | Admit: 2018-11-05 | Discharge: 2018-11-05 | Disposition: A | Discharge status: Home / Self Care | Payer: Medicare Other | Attending: Cardiology | Admitting: Cardiology

## 2018-11-05 ENCOUNTER — Encounter (HOSPITAL_COMMUNITY)
Admission: RE | Disposition: A | Discharge status: Home / Self Care | Payer: Self-pay | Source: Home / Self Care | Attending: Cardiology

## 2018-11-05 DIAGNOSIS — Z79899 Other long term (current) drug therapy: Secondary | ICD-10-CM | POA: Insufficient documentation

## 2018-11-05 DIAGNOSIS — Z833 Family history of diabetes mellitus: Secondary | ICD-10-CM | POA: Diagnosis not present

## 2018-11-05 DIAGNOSIS — Z87891 Personal history of nicotine dependence: Secondary | ICD-10-CM | POA: Diagnosis not present

## 2018-11-05 DIAGNOSIS — I471 Supraventricular tachycardia, unspecified: Secondary | ICD-10-CM | POA: Diagnosis present

## 2018-11-05 DIAGNOSIS — I200 Unstable angina: Secondary | ICD-10-CM | POA: Diagnosis present

## 2018-11-05 DIAGNOSIS — Z7982 Long term (current) use of aspirin: Secondary | ICD-10-CM | POA: Diagnosis not present

## 2018-11-05 DIAGNOSIS — E039 Hypothyroidism, unspecified: Secondary | ICD-10-CM | POA: Insufficient documentation

## 2018-11-05 DIAGNOSIS — I25119 Atherosclerotic heart disease of native coronary artery with unspecified angina pectoris: Secondary | ICD-10-CM | POA: Diagnosis not present

## 2018-11-05 DIAGNOSIS — R7303 Prediabetes: Secondary | ICD-10-CM | POA: Diagnosis not present

## 2018-11-05 DIAGNOSIS — Z8249 Family history of ischemic heart disease and other diseases of the circulatory system: Secondary | ICD-10-CM | POA: Diagnosis not present

## 2018-11-05 DIAGNOSIS — R0789 Other chest pain: Secondary | ICD-10-CM | POA: Diagnosis present

## 2018-11-05 DIAGNOSIS — Z823 Family history of stroke: Secondary | ICD-10-CM | POA: Diagnosis not present

## 2018-11-05 DIAGNOSIS — Z955 Presence of coronary angioplasty implant and graft: Secondary | ICD-10-CM | POA: Insufficient documentation

## 2018-11-05 DIAGNOSIS — I2510 Atherosclerotic heart disease of native coronary artery without angina pectoris: Secondary | ICD-10-CM

## 2018-11-05 DIAGNOSIS — I10 Essential (primary) hypertension: Secondary | ICD-10-CM | POA: Diagnosis not present

## 2018-11-05 DIAGNOSIS — M25552 Pain in left hip: Secondary | ICD-10-CM | POA: Diagnosis not present

## 2018-11-05 DIAGNOSIS — E785 Hyperlipidemia, unspecified: Secondary | ICD-10-CM | POA: Diagnosis not present

## 2018-11-05 DIAGNOSIS — K219 Gastro-esophageal reflux disease without esophagitis: Secondary | ICD-10-CM | POA: Diagnosis not present

## 2018-11-05 HISTORY — PX: LEFT HEART CATH AND CORONARY ANGIOGRAPHY: CATH118249

## 2018-11-05 SURGERY — LEFT HEART CATH AND CORONARY ANGIOGRAPHY
Anesthesia: LOCAL

## 2018-11-05 MED ORDER — SODIUM CHLORIDE 0.9% FLUSH: 3.0000 mL | INTRAVENOUS | Status: DC | PRN

## 2018-11-05 MED ORDER — ASPIRIN 81 MG PO CHEW: 81.0000 mg | CHEWABLE_TABLET | ORAL | Status: DC

## 2018-11-05 MED ORDER — MIDAZOLAM HCL 2 MG/2ML IJ SOLN
INTRAMUSCULAR | Status: DC | PRN
  Administered 2018-11-05: 1 mg via INTRAVENOUS

## 2018-11-05 MED ORDER — LIDOCAINE HCL (PF) 1 % IJ SOLN
INTRAMUSCULAR | Status: AC
  Filled 2018-11-05: qty 30

## 2018-11-05 MED ORDER — HEPARIN (PORCINE) IN NACL 1000-0.9 UT/500ML-% IV SOLN
INTRAVENOUS | Status: DC | PRN
  Administered 2018-11-05: 500 mL

## 2018-11-05 MED ORDER — SODIUM CHLORIDE 0.9 % WEIGHT BASED INFUSION: 1.0000 mL/kg/h | INTRAVENOUS | Status: AC

## 2018-11-05 MED ORDER — SODIUM CHLORIDE 0.9% FLUSH: 3.0000 mL | Freq: Two times a day (BID) | INTRAVENOUS | Status: DC

## 2018-11-05 MED ORDER — FENTANYL CITRATE (PF) 100 MCG/2ML IJ SOLN
INTRAMUSCULAR | Status: AC
  Filled 2018-11-05: qty 2

## 2018-11-05 MED ORDER — SODIUM CHLORIDE 0.9 % WEIGHT BASED INFUSION: 1.0000 mL/kg/h | INTRAVENOUS | Status: DC

## 2018-11-05 MED ORDER — ONDANSETRON HCL 4 MG/2ML IJ SOLN: 4.0000 mg | Freq: Four times a day (QID) | INTRAMUSCULAR | Status: DC | PRN

## 2018-11-05 MED ORDER — HEPARIN SODIUM (PORCINE) 1000 UNIT/ML IJ SOLN
INTRAMUSCULAR | Status: DC | PRN
  Administered 2018-11-05: 5000 [IU] via INTRAVENOUS

## 2018-11-05 MED ORDER — VERAPAMIL HCL 2.5 MG/ML IV SOLN
INTRAVENOUS | Status: DC | PRN
  Administered 2018-11-05: 11:00:00 via INTRA_ARTERIAL

## 2018-11-05 MED ORDER — VERAPAMIL HCL 2.5 MG/ML IV SOLN
INTRAVENOUS | Status: AC
  Filled 2018-11-05: qty 2

## 2018-11-05 MED ORDER — LIDOCAINE HCL (PF) 1 % IJ SOLN
INTRAMUSCULAR | Status: DC | PRN
  Administered 2018-11-05: 2 mL

## 2018-11-05 MED ORDER — SODIUM CHLORIDE 0.9 % IV SOLN: 250.0000 mL | INTRAVENOUS | Status: DC | PRN

## 2018-11-05 MED ORDER — MIDAZOLAM HCL 2 MG/2ML IJ SOLN
INTRAMUSCULAR | Status: AC
  Filled 2018-11-05: qty 2

## 2018-11-05 MED ORDER — FENTANYL CITRATE (PF) 100 MCG/2ML IJ SOLN
INTRAMUSCULAR | Status: DC | PRN
  Administered 2018-11-05: 50 ug via INTRAVENOUS

## 2018-11-05 MED ORDER — ACETAMINOPHEN 325 MG PO TABS: 650.0000 mg | ORAL_TABLET | ORAL | Status: DC | PRN

## 2018-11-05 MED ORDER — SODIUM CHLORIDE 0.9 % WEIGHT BASED INFUSION
3.0000 mL/kg/h | INTRAVENOUS | Status: AC
  Administered 2018-11-05: 3 mL/kg/h via INTRAVENOUS

## 2018-11-05 MED ORDER — IOHEXOL 350 MG/ML SOLN
INTRAVENOUS | Status: DC | PRN
  Administered 2018-11-05: 45 mL via INTRA_ARTERIAL

## 2018-11-05 MED ORDER — HEPARIN (PORCINE) IN NACL 1000-0.9 UT/500ML-% IV SOLN
INTRAVENOUS | Status: AC
  Filled 2018-11-05: qty 1000

## 2018-11-05 SURGICAL SUPPLY — 11 items
CATH 5FR JL3.5 JR4 ANG PIG MP (CATHETERS) ×1 IMPLANT
CATH LAUNCHER 5F RADR (CATHETERS) IMPLANT
CATHETER LAUNCHER 5F RADR (CATHETERS) ×2
DEVICE RAD COMP TR BAND LRG (VASCULAR PRODUCTS) ×1 IMPLANT
GLIDESHEATH SLEND SS 6F .021 (SHEATH) ×1 IMPLANT
GUIDEWIRE INQWIRE 1.5J.035X260 (WIRE) IMPLANT
INQWIRE 1.5J .035X260CM (WIRE) ×2
KIT HEART LEFT (KITS) ×2 IMPLANT
PACK CARDIAC CATHETERIZATION (CUSTOM PROCEDURE TRAY) ×2 IMPLANT
TRANSDUCER W/STOPCOCK (MISCELLANEOUS) ×2 IMPLANT
TUBING CIL FLEX 10 FLL-RA (TUBING) ×2 IMPLANT

## 2018-11-05 NOTE — Progress Notes (Signed)
Dr Martinique notified of client c/o 5/10 chest pain and no new orders noted

## 2018-11-05 NOTE — Interval H&P Note (Signed)
History and Physical Interval Note:  11/05/2018 10:21 AM  Johnny [name]  has presented today for surgery, with the diagnosis of cp  The various methods of treatment have been discussed with the patient and family. After consideration of risks, benefits and other options for treatment, the patient has consented to  Procedure(s): LEFT HEART CATH AND CORONARY ANGIOGRAPHY (N/A) as a surgical intervention .  The patient's history has been reviewed, patient examined, no change in status, stable for surgery.  I have reviewed the patient's chart and labs.  Questions were answered to the patient's satisfaction.    Cath Lab Visit (complete for each Cath Lab visit)  Clinical Evaluation Leading to the Procedure:   ACS: Yes.    Non-ACS:    Anginal Classification: CCS IV  Anti-ischemic medical therapy: Minimal Therapy (1 class of medications)  Non-Invasive Test Results: No non-invasive testing performed  Prior CABG: No previous CABG        Collier Salina [facility] 11/05/2018 10:21 AM

## 2018-11-05 NOTE — Discharge Instructions (Signed)
Radial Site Care ° °This sheet gives you information about how to care for yourself after your procedure. Your health care provider [place] also give you more specific instructions. If you have problems or questions, contact your health care provider. °What can I expect after the procedure? °After the procedure, it is common to have: °· Bruising and tenderness at the catheter insertion area. °Follow these instructions at home: °Medicines °· Take over-the-counter and prescription medicines only as told by your health care provider. °Insertion site care °· Follow instructions from your health care provider about how to take care of your insertion site. Make sure you: °? Wash your hands with soap and water before you change your bandage (dressing). If soap and water are not available, use hand sanitizer. °? Change your dressing as told by your health care provider. °? Leave stitches (sutures), skin glue, or adhesive strips in place. These skin closures Bagdasarian need to stay in place for 2 weeks or longer. If adhesive strip edges start to loosen and curl up, you Hull trim the loose edges. Do not remove adhesive strips completely unless your health care provider tells you to do that. °· Check your insertion site every day for signs of infection. Check for: °? Redness, swelling, or pain. °? Fluid or blood. °? Pus or a bad smell. °? Warmth. °· Do not take baths, swim, or use a hot tub until your health care provider approves. °· You [place] shower 24-48 hours after the procedure, or as directed by your health care provider. °? Remove the dressing and gently wash the site with plain soap and water. °? Pat the area dry with a clean towel. °? Do not rub the site. That could cause bleeding. °· Do not apply powder or lotion to the site. °Activity ° °· For 24 hours after the procedure, or as directed by your health care provider: °? Do not flex or bend the affected arm. °? Do not push or pull heavy objects with the affected arm. °? Do not  drive yourself home from the hospital or clinic. You Summer drive 24 hours after the procedure unless your health care provider tells you not to. °? Do not operate machinery or power tools. °· Do not lift anything that is heavier than 10 lb (4.5 kg), or the limit that you are told, until your health care provider says that it is safe. °· Ask your health care provider when it is okay to: °? Return to work or school. °? Resume usual physical activities or sports. °? Resume sexual activity. °General instructions °· If the catheter site starts to bleed, raise your arm and put firm pressure on the site. If the bleeding does not stop, get help right away. This is a medical emergency. °· If you went home on the same day as your procedure, a responsible adult should be with you for the first 24 hours after you arrive home. °· Keep all follow-up visits as told by your health care provider. This is important. °Contact a health care provider if: °· You have a fever. °· You have redness, swelling, or yellow drainage around your insertion site. °Get help right away if: °· You have unusual pain at the radial site. °· The catheter insertion area swells very fast. °· The insertion area is bleeding, and the bleeding does not stop when you hold steady pressure on the area. °· Your arm or hand becomes pale, cool, tingly, or numb. °These symptoms Trimm represent a serious problem   that is an emergency. Do not wait to see if the symptoms will go away. Get medical help right away. Call your local emergency services (911 in the [place] Do not drive yourself to the hospital. °Summary °· After the procedure, it is common to have bruising and tenderness at the site. °· Follow instructions from your health care provider about how to take care of your radial site wound. Check the wound every day for signs of infection. °· Do not lift anything that is heavier than 10 lb (4.5 kg), or the limit that you are told, until your health care provider says  that it is safe. °This information is not intended to replace advice given to you by your health care provider. Make sure you discuss any questions you have with your health care provider. °Document Released: 11/15/2010 Document Revised: 11/18/2017 Document Reviewed: 11/18/2017 °Elsevier Interactive Patient Education © 2019 [employer] ° °

## 2018-11-08 ENCOUNTER — Encounter (HOSPITAL_COMMUNITY): Payer: Self-pay | Admitting: Cardiology

## 2018-11-10 DIAGNOSIS — R0602 Shortness of breath: Secondary | ICD-10-CM | POA: Diagnosis not present

## 2018-11-10 DIAGNOSIS — I2510 Atherosclerotic heart disease of native coronary artery without angina pectoris: Secondary | ICD-10-CM | POA: Diagnosis not present

## 2018-11-10 DIAGNOSIS — F329 Major depressive disorder, single episode, unspecified: Secondary | ICD-10-CM | POA: Diagnosis not present

## 2018-11-10 DIAGNOSIS — Z6827 Body mass index (BMI) 27.0-27.9, adult: Secondary | ICD-10-CM | POA: Diagnosis not present

## 2018-11-10 DIAGNOSIS — G8929 Other chronic pain: Secondary | ICD-10-CM | POA: Diagnosis not present

## 2018-11-21 NOTE — Progress Notes (Signed)
Johnny [name] Date of Birth: Lehew 30, 1942   History of Present Illness: Johnny [name] is seen for follow up SVT and CAD.  He has a history of supraventricular tachycardia. He is s/p stenting of the proximal LAD in 2005 with a Cypher stent. He is on chronic DAPT. He had repeat caths in 2010 and Dec. 2012 with nonobstructive disease.   He was admitted 1/3-10/30/18 with complaints of dyspnea, chest pressure and a "fluttering" sensation. Troponin levels were negative x 3. V/Q scan was low probability for PE. Creatinine was elevated to 1.79. ACEi and diuretic were held. Metoprolol increased for fluttering. There was some consideration for cardiac cath but this was deferred due to elevated creatinine. On follow up he had  persistent anterior chest pressure and tightness like he is wearing a seatbelt that is too tight. BP has been running higher. He has no energy and complains of SOB.  Due to these persistent symptoms and improvement in renal function he did undergo a cardiac cath on 11/05/18 showing nonobstructive CAD.  On follow up today he is seen with his daughter. Still feels weak. Getting PT for his hip pain. lipitor held by Dr. Reynaldo Minium to see if this helps his pain/weakness. BP has been running higher. Lisinopril without HCTZ resumed.   Current Outpatient Medications on File Prior to Visit  Medication Sig Dispense Refill  . acetaminophen (TYLENOL) 650 MG CR tablet Take 650 mg by mouth 2 (two) times daily.    [name] aspirin 81 MG tablet Take 81 mg by mouth daily.      . clopidogrel (PLAVIX) 75 MG tablet TAKE 1 TABLET BY MOUTH EVERY DAY (Patient taking differently: Take 75 mg by mouth daily. ) 90 tablet 1  . Cyanocobalamin (VITAMIN B-12 IJ) Inject as directed every 30 (thirty) days.     . diclofenac sodium (VOLTAREN) 1 % GEL Apply 2 g topically 4 (four) times daily. (Patient taking differently: Apply 2 g topically 4 (four) times daily as needed (hip pain). ) 1 Tube 5  . Glucos-Chond-Hyal Ac-Ca Fructo (MOVE FREE  JOINT HEALTH ADVANCE PO) Take 1 capsule by mouth 2 (two) times daily.    . hydrOXYzine (ATARAX) 25 MG tablet Take 25 mg by mouth daily.      [name] levothyroxine (SYNTHROID, LEVOTHROID) 25 MCG tablet Take 25 mcg by mouth daily.      . meclizine (ANTIVERT) 25 MG tablet Take 25 mg by mouth 2 (two) times daily as needed for dizziness or nausea.     . meloxicam (MOBIC) 15 MG tablet Take 15 mg by mouth daily.     . nitroGLYCERIN (NITROSTAT) 0.4 MG SL tablet Place 1 tablet (0.4 mg total) under the tongue every 5 (five) minutes as needed for chest pain. 1 tablet 0  . senna (SENOKOT) 8.6 MG TABS tablet Take 1 tablet by mouth daily.    [name] atorvastatin (LIPITOR) 10 MG tablet TAKE 1 TABLET BY MOUTH EVERY DAY (Patient not taking: No sig reported) 90 tablet 0   No current facility-administered medications on file prior to visit.     No Known Allergies  Past Medical History:  Diagnosis Date  . Acromioclavicular joint arthritis   . Anemia   . B12 deficiency 10/15/11   "take injections q 28 days"  . Coronary artery disease    LHC 10/15/11: LAD stent patent with mid ectasia, distal LAD 50-60%, mid circumflex with ectasia, EF 55-65%.  . Dyslipidemia   . GERD (gastroesophageal reflux disease)   . Glucose  intolerance (pre-diabetes)    Borderline glucose intolerance  . Hypertension   . Hypothyroidism   . Post concussive syndrome 2011   "for ~ 6-8 months"  . Rotator cuff tear   . Shortness of breath 10/15/11   "here lately I've been having it on & off any time"  . SVT (supraventricular tachycardia) ([place])    maintained on beta blocker    Past Surgical History:  Procedure Laterality Date  . BACK SURGERY    . CARDIAC CATHETERIZATION  12/19/08   NORMAL. EF 55%  . CARDIAC CATHETERIZATION  10/15/11  . CARDIOVASCULAR STRESS TEST  10/2010  . CARPAL TUNNEL RELEASE     bilaterally  . COLONOSCOPY W/ ENDOSCOPIC [place]    . CORONARY ANGIOPLASTY WITH STENT PLACEMENT  9/ 2005   . JOINT REPLACEMENT    . LEFT HEART  CATH AND CORONARY ANGIOGRAPHY N/A 11/05/2018   Procedure: LEFT HEART CATH AND CORONARY ANGIOGRAPHY;  Surgeon: Martinique, Peter M, MD;  Location: [facility];  Service: Cardiovascular;  Laterality: N/A;  . LEFT HEART CATHETERIZATION WITH CORONARY ANGIOGRAM N/A 10/15/2011   Procedure: LEFT HEART CATHETERIZATION WITH CORONARY ANGIOGRAM;  Surgeon: Sherren Mocha, MD;  Location: [facility] CATH LAB;  Service: Cardiovascular;  Laterality: N/A;  . LUMBAR LAMINECTOMY  1970's  . NASAL SINUS SURGERY     x2  . reconstructive shoulder surgery     right  . REPLACEMENT TOTAL KNEE  12/2007   left  . RESECTION TUMOR CLAVICLE RADICAL     Open distal clavicle resection  . SHOULDER SURGERY  02/11/2011   right  . TRIGGER FINGER RELEASE     ? both hands    Social History   Tobacco Use  Smoking Status Former Smoker  . Packs/day: 1.00  . Years: 21.00  . Pack years: 21.00  . Types: Cigarettes  . Last attempt to quit: 04/07/1976  . Years since quitting: 42.6  Smokeless Tobacco Never Used    Social History   Substance and Sexual Activity  Alcohol Use Yes   Comment: 10/15/11 "haven't drank in 15-20 years"    Family History  Problem Relation Age of Onset  . Stroke Mother   . Heart attack Father   . Diabetes Father   . Kidney failure Father     Review of Systems: As noted in history of present illness.  All other systems were reviewed and are negative.  Physical Exam: BP 140/90   Pulse 61   Ht 6' (1.829 m)   Wt 209 lb 6.4 oz (95 kg)   BMI 28.40 kg/m  GENERAL:  Well appearing WM in NAD HEENT:  PERRL, EOMI, sclera are clear. Oropharynx is clear. NECK:  No jugular venous distention, carotid upstroke brisk and symmetric, no bruits, no thyromegaly or adenopathy LUNGS:  Clear to auscultation bilaterally CHEST:  Unremarkable HEART:  RRR,  PMI not displaced or sustained,S1 and S2 within normal limits, no S3, no S4: no clicks, no rubs, no murmurs ABD:  Soft, nontender. BS +, no masses or bruits. No  hepatomegaly, no splenomegaly EXT:  2 + pulses throughout, no edema, no cyanosis no clubbing. No hematoma at cath site. SKIN:  Warm and dry.  No rashes NEURO:  Alert and oriented x 3. Cranial nerves II through XII intact. PSYCH:  Cognitively intact      LABORATORY DATA:  Lab Results  Component Value Date   WBC 7.9 11/03/2018   HGB 15.3 11/03/2018   HCT 45.1 11/03/2018   PLT 248  11/03/2018   GLUCOSE 106 (H) 11/03/2018   CHOL 133 10/30/2018   TRIG 142 10/30/2018   HDL 39 (L) 10/30/2018   LDLCALC 66 10/30/2018   ALT 12 10/29/2018   AST 16 10/29/2018   NA 142 11/03/2018   K 4.6 11/03/2018   CL 105 11/03/2018   CREATININE 1.40 (H) 11/03/2018   BUN 25 11/03/2018   CO2 20 11/03/2018   TSH 2.943 10/29/2018   INR 0.96 10/15/2011   HGBA1C 5.0 10/29/2018    Reviewed from primary care dated 05/14/16: cholesterol 141, triglycerides 174, HDL39, LDL 67. BUN 23, creatinine 1.5. Other chemistries, Hgb, and TSH were normal.  Cardiac cath 11/05/18: Procedures   LEFT HEART CATH AND CORONARY ANGIOGRAPHY  Conclusion     Previously placed Prox LAD stent (unknown type) is widely patent.  Mid LAD lesion is 45% stenosed.  LV end diastolic pressure is normal.   1. Nonobstructive CAD 2. Normal LVEDP  Plan: continue medical therapy. Will obtain an Echo as outpatient to assess LV function. Consider alternative causes of chest pain.     Assessment / Plan: 1. Coronary disease. S/p remote stenting of the proximal LAD with Cypher stent in 2005. Recent Cardiac catheterization 11/05/18  showed nonobstructive disease.   2. Dyslipidemia LDL at goal. 66 on statin. Currently lipitor on hold until he sees Dr. Reynaldo Minium back on Feb 20.   3. Hypertension. Back on lisinopril 20 mg daily. Will increase metoprolol to 75 mg bid. Stay off HCTZ due to worsening kidney function. Can also stop potassium. Would prefer he not take NSAIDs.   4. SVT controlled on metoprolol  5. Left hip pain. Will need  follow up with ortho given lack of improvement with conservative therapy.

## 2018-11-24 ENCOUNTER — Other Ambulatory Visit: Payer: Self-pay | Admitting: Cardiology

## 2018-11-24 DIAGNOSIS — D510 Vitamin B12 deficiency anemia due to intrinsic factor deficiency: Secondary | ICD-10-CM | POA: Diagnosis not present

## 2018-11-25 ENCOUNTER — Ambulatory Visit (INDEPENDENT_AMBULATORY_CARE_PROVIDER_SITE_OTHER): Payer: Medicare Other | Admitting: Cardiology

## 2018-11-25 ENCOUNTER — Encounter: Payer: Self-pay | Admitting: Cardiology

## 2018-11-25 VITALS — BP 140/90 | HR 61 | Ht 72.0 in | Wt 209.4 lb

## 2018-11-25 DIAGNOSIS — I471 Supraventricular tachycardia: Secondary | ICD-10-CM

## 2018-11-25 DIAGNOSIS — N183 Chronic kidney disease, stage 3 unspecified: Secondary | ICD-10-CM

## 2018-11-25 DIAGNOSIS — I10 Essential (primary) hypertension: Secondary | ICD-10-CM | POA: Diagnosis not present

## 2018-11-25 DIAGNOSIS — I200 Unstable angina: Secondary | ICD-10-CM

## 2018-11-25 DIAGNOSIS — I25110 Atherosclerotic heart disease of native coronary artery with unstable angina pectoris: Secondary | ICD-10-CM | POA: Diagnosis not present

## 2018-11-25 MED ORDER — PANTOPRAZOLE SODIUM 40 MG PO TBEC: 40.0000 mg | DELAYED_RELEASE_TABLET | Freq: Every day | ORAL | 1 refills | Status: AC

## 2018-11-25 MED ORDER — METOPROLOL TARTRATE 50 MG PO TABS: 75.0000 mg | ORAL_TABLET | Freq: Two times a day (BID) | ORAL | 3 refills | Status: DC

## 2018-11-25 MED ORDER — LISINOPRIL 20 MG PO TABS: 20.0000 mg | ORAL_TABLET | Freq: Every day | ORAL | 3 refills | Status: DC

## 2018-11-25 NOTE — Patient Instructions (Signed)
Hold lipitor until you see Johnny [name] back  Continue lisinopril 20 mg daily  Increase metoprolol to 75 mg twice a day  Stop potassium - [place].

## 2018-11-28 ENCOUNTER — Other Ambulatory Visit: Payer: Self-pay | Admitting: Cardiology

## 2018-12-15 DIAGNOSIS — G8929 Other chronic pain: Secondary | ICD-10-CM | POA: Diagnosis not present

## 2018-12-15 DIAGNOSIS — E038 Other specified hypothyroidism: Secondary | ICD-10-CM | POA: Diagnosis not present

## 2018-12-15 DIAGNOSIS — I10 Essential (primary) hypertension: Secondary | ICD-10-CM | POA: Diagnosis not present

## 2018-12-20 ENCOUNTER — Other Ambulatory Visit: Payer: Self-pay | Admitting: Internal Medicine

## 2018-12-20 DIAGNOSIS — R0602 Shortness of breath: Secondary | ICD-10-CM | POA: Diagnosis not present

## 2018-12-20 DIAGNOSIS — G8929 Other chronic pain: Secondary | ICD-10-CM | POA: Diagnosis not present

## 2018-12-20 DIAGNOSIS — R52 Pain, unspecified: Secondary | ICD-10-CM | POA: Diagnosis not present

## 2018-12-20 DIAGNOSIS — R634 Abnormal weight loss: Secondary | ICD-10-CM | POA: Diagnosis not present

## 2018-12-20 DIAGNOSIS — I10 Essential (primary) hypertension: Secondary | ICD-10-CM | POA: Diagnosis not present

## 2018-12-20 DIAGNOSIS — F329 Major depressive disorder, single episode, unspecified: Secondary | ICD-10-CM | POA: Diagnosis not present

## 2018-12-20 DIAGNOSIS — I2510 Atherosclerotic heart disease of native coronary artery without angina pectoris: Secondary | ICD-10-CM | POA: Diagnosis not present

## 2018-12-20 DIAGNOSIS — N183 Chronic kidney disease, stage 3 (moderate): Secondary | ICD-10-CM | POA: Diagnosis not present

## 2018-12-20 DIAGNOSIS — R5383 Other fatigue: Secondary | ICD-10-CM | POA: Diagnosis not present

## 2018-12-20 DIAGNOSIS — I129 Hypertensive chronic kidney disease with stage 1 through stage 4 chronic kidney disease, or unspecified chronic kidney disease: Secondary | ICD-10-CM | POA: Diagnosis not present

## 2018-12-20 DIAGNOSIS — M545 Low back pain: Secondary | ICD-10-CM | POA: Diagnosis not present

## 2018-12-20 DIAGNOSIS — R7301 Impaired fasting glucose: Secondary | ICD-10-CM | POA: Diagnosis not present

## 2018-12-20 DIAGNOSIS — E038 Other specified hypothyroidism: Secondary | ICD-10-CM | POA: Diagnosis not present

## 2018-12-23 ENCOUNTER — Other Ambulatory Visit: Payer: Self-pay

## 2018-12-23 MED ORDER — ATORVASTATIN CALCIUM 10 MG PO TABS: 10.0000 mg | ORAL_TABLET | Freq: Every day | ORAL | 3 refills | Status: DC

## 2018-12-23 NOTE — Telephone Encounter (Signed)
Rx(s) sent to pharmacy electronically.  

## 2018-12-28 DIAGNOSIS — D510 Vitamin B12 deficiency anemia due to intrinsic factor deficiency: Secondary | ICD-10-CM | POA: Diagnosis not present

## 2018-12-28 DIAGNOSIS — G8929 Other chronic pain: Secondary | ICD-10-CM | POA: Diagnosis not present

## 2018-12-31 ENCOUNTER — Ambulatory Visit
Admission: RE | Admit: 2018-12-31 | Discharge: 2018-12-31 | Disposition: A | Discharge status: Home / Self Care | Payer: Medicare Other | Source: Ambulatory Visit | Attending: Internal Medicine | Admitting: Internal Medicine

## 2018-12-31 DIAGNOSIS — K4090 Unilateral inguinal hernia, without obstruction or gangrene, not specified as recurrent: Secondary | ICD-10-CM | POA: Diagnosis not present

## 2018-12-31 DIAGNOSIS — N281 Cyst of kidney, acquired: Secondary | ICD-10-CM | POA: Diagnosis not present

## 2018-12-31 DIAGNOSIS — R634 Abnormal weight loss: Secondary | ICD-10-CM

## 2018-12-31 DIAGNOSIS — K8020 Calculus of gallbladder without cholecystitis without obstruction: Secondary | ICD-10-CM | POA: Diagnosis not present

## 2018-12-31 MED ORDER — IOPAMIDOL (ISOVUE-300) INJECTION 61%
100.0000 mL | Freq: Once | INTRAVENOUS | Status: AC | PRN
  Administered 2018-12-31: 100 mL via INTRAVENOUS

## 2019-01-10 DIAGNOSIS — H5203 Hypermetropia, bilateral: Secondary | ICD-10-CM | POA: Diagnosis not present

## 2019-01-10 DIAGNOSIS — H5000 Unspecified esotropia: Secondary | ICD-10-CM | POA: Diagnosis not present

## 2019-01-10 DIAGNOSIS — H2513 Age-related nuclear cataract, bilateral: Secondary | ICD-10-CM | POA: Diagnosis not present

## 2019-01-20 ENCOUNTER — Other Ambulatory Visit: Payer: Self-pay

## 2019-01-20 ENCOUNTER — Emergency Department (HOSPITAL_COMMUNITY): Payer: Medicare Other

## 2019-01-20 ENCOUNTER — Emergency Department (HOSPITAL_COMMUNITY)
Admission: EM | Admit: 2019-01-20 | Discharge: 2019-01-20 | Disposition: A | Discharge status: Home / Self Care | Payer: Medicare Other | Attending: Emergency Medicine | Admitting: Emergency Medicine

## 2019-01-20 ENCOUNTER — Encounter (HOSPITAL_COMMUNITY): Payer: Self-pay | Admitting: Emergency Medicine

## 2019-01-20 DIAGNOSIS — R1084 Generalized abdominal pain: Secondary | ICD-10-CM | POA: Diagnosis not present

## 2019-01-20 DIAGNOSIS — R1110 Vomiting, unspecified: Secondary | ICD-10-CM

## 2019-01-20 DIAGNOSIS — Z96652 Presence of left artificial knee joint: Secondary | ICD-10-CM | POA: Insufficient documentation

## 2019-01-20 DIAGNOSIS — Z6825 Body mass index (BMI) 25.0-25.9, adult: Secondary | ICD-10-CM | POA: Diagnosis not present

## 2019-01-20 DIAGNOSIS — N183 Chronic kidney disease, stage 3 (moderate): Secondary | ICD-10-CM | POA: Diagnosis not present

## 2019-01-20 DIAGNOSIS — K8020 Calculus of gallbladder without cholecystitis without obstruction: Secondary | ICD-10-CM | POA: Diagnosis not present

## 2019-01-20 DIAGNOSIS — I2510 Atherosclerotic heart disease of native coronary artery without angina pectoris: Secondary | ICD-10-CM | POA: Insufficient documentation

## 2019-01-20 DIAGNOSIS — R112 Nausea with vomiting, unspecified: Secondary | ICD-10-CM | POA: Diagnosis not present

## 2019-01-20 DIAGNOSIS — I10 Essential (primary) hypertension: Secondary | ICD-10-CM | POA: Insufficient documentation

## 2019-01-20 DIAGNOSIS — Z87891 Personal history of nicotine dependence: Secondary | ICD-10-CM | POA: Diagnosis not present

## 2019-01-20 DIAGNOSIS — R197 Diarrhea, unspecified: Secondary | ICD-10-CM | POA: Diagnosis present

## 2019-01-20 DIAGNOSIS — I129 Hypertensive chronic kidney disease with stage 1 through stage 4 chronic kidney disease, or unspecified chronic kidney disease: Secondary | ICD-10-CM | POA: Diagnosis not present

## 2019-01-20 DIAGNOSIS — E039 Hypothyroidism, unspecified: Secondary | ICD-10-CM | POA: Insufficient documentation

## 2019-01-20 DIAGNOSIS — R109 Unspecified abdominal pain: Secondary | ICD-10-CM | POA: Diagnosis not present

## 2019-01-20 LAB — COMPREHENSIVE METABOLIC PANEL
ALK PHOS: 49 U/L (ref 38–126)
ALT: 15 U/L (ref 0–44)
AST: 25 U/L (ref 15–41)
Albumin: 4.4 g/dL (ref 3.5–5.0)
Anion gap: 12 (ref 5–15)
BUN: 13 mg/dL (ref 8–23)
CALCIUM: 9.8 mg/dL (ref 8.9–10.3)
CO2: 21 mmol/L — ABNORMAL LOW (ref 22–32)
Chloride: 104 mmol/L (ref 98–111)
Creatinine, Ser: 1.22 mg/dL (ref 0.61–1.24)
GFR calc Af Amer: >60 mL/min (ref >60)
GFR calc non Af Amer: 56 mL/min — ABNORMAL LOW (ref >60)
Glucose, Bld: 125 mg/dL — ABNORMAL HIGH (ref 70–99)
Potassium: 3.6 mmol/L (ref 3.5–5.1)
Sodium: 137 mmol/L (ref 135–145)
Total Bilirubin: 1.4 mg/dL — ABNORMAL HIGH (ref 0.3–1.2)
Total Protein: 7.2 g/dL (ref 6.5–8.1)

## 2019-01-20 LAB — CBC WITH DIFFERENTIAL/PLATELET
Abs Immature Granulocytes: 0.03 10*3/uL (ref 0.00–0.07)
Basophils Absolute: 0.1 10*3/uL (ref 0.0–0.1)
Basophils Relative: 0 %
Eosinophils Absolute: 0 10*3/uL (ref 0.0–0.5)
Eosinophils Relative: 0 %
HCT: 50.7 % (ref 39.0–52.0)
Hemoglobin: 16.7 g/dL (ref 13.0–17.0)
Immature Granulocytes: 0 %
Lymphocytes Relative: 10 %
Lymphs Abs: 1.2 10*3/uL (ref 0.7–4.0)
MCH: 29.4 pg (ref 26.0–34.0)
MCHC: 32.9 g/dL (ref 30.0–36.0)
MCV: 89.3 fL (ref 80.0–100.0)
MONO ABS: 1 10*3/uL (ref 0.1–1.0)
Monocytes Relative: 9 %
Neutro Abs: 8.9 10*3/uL — ABNORMAL HIGH (ref 1.7–7.7)
Neutrophils Relative %: 81 %
Platelets: 275 10*3/uL (ref 150–400)
RBC: 5.68 MIL/uL (ref 4.22–5.81)
RDW: 12.7 % (ref 11.5–15.5)
WBC: 11.2 10*3/uL — AB (ref 4.0–10.5)
nRBC: 0 % (ref 0.0–0.2)

## 2019-01-20 LAB — LIPASE, BLOOD: Lipase: 18 U/L (ref 11–51)

## 2019-01-20 LAB — TROPONIN I: Troponin I: <0.03 ng/mL (ref <0.03)

## 2019-01-20 MED ORDER — IOHEXOL 300 MG/ML  SOLN
100.0000 mL | Freq: Once | INTRAMUSCULAR | Status: AC | PRN
  Administered 2019-01-20: 100 mL via INTRAVENOUS

## 2019-01-20 MED ORDER — ONDANSETRON HCL 4 MG PO TABS: 4.0000 mg | ORAL_TABLET | Freq: Three times a day (TID) | ORAL | 0 refills | Status: AC | PRN

## 2019-01-20 MED ORDER — LACTATED RINGERS IV BOLUS
1000.0000 mL | Freq: Once | INTRAVENOUS | Status: AC
  Administered 2019-01-20: 1000 mL via INTRAVENOUS

## 2019-01-20 MED ORDER — ONDANSETRON HCL 4 MG/2ML IJ SOLN
4.0000 mg | Freq: Once | INTRAMUSCULAR | Status: AC
  Administered 2019-01-20: 4 mg via INTRAVENOUS
  Filled 2019-01-20: qty 2

## 2019-01-20 NOTE — ED Notes (Addendum)
Pt noted by NS to be getting out of bed and at risk of falling. Pt assisted to chair for safety as myself and NT sorted leads & lines to untangle pt. Pt states he was trying to make a phone call to his daughter. Re-made bed and assisted pt back into it & on monitor. Assisted pt in placing phone call to daughter through room phone.

## 2019-01-20 NOTE — ED Notes (Signed)
Patient transported to Ultrasound 

## 2019-01-20 NOTE — ED Notes (Signed)
Pt returned from CT °

## 2019-01-20 NOTE — ED Notes (Signed)
All appropriate discharge materials reviewed with patient at length. Time for questions provided. Pt denies any further questions at this time. Verbalizes understanding of all provided materials.  

## 2019-01-20 NOTE — ED Notes (Signed)
Pt given gingerale and graham crackers for PO challenge  

## 2019-01-20 NOTE — ED Notes (Signed)
ED Provider at bedside. 

## 2019-01-20 NOTE — ED Notes (Signed)
Patient transported to CT 

## 2019-01-20 NOTE — ED Provider Notes (Signed)
Emergency Department Provider Note   I have reviewed the triage vital signs and the nursing notes.   HISTORY  Chief Complaint Emesis   HPI Johnny [name] is a 78 y.o. male with a significant past medical history who presents the emergency department today with a few days of symptoms.  Patient states he had a couple weeks of diarrhea but then a couple days ago is diarrhea stopped and has had a bowel movements since then but has had multiple episodes of nonbloody nonbilious vomiting.  States is just yellow appearing stuff.  He went to see his primary doctor where they did some labs sent him here for further evaluation.  Patient states his abdomen will get bloated before he throws up and then when he throws up it will improve.  No fevers.  No sick contacts.  No rashes.  No other associated symptoms.  No other associated or modifying symptoms.    Past Medical History:  Diagnosis Date  . Acromioclavicular joint arthritis   . Anemia   . B12 deficiency 10/15/11   "take injections q 28 days"  . Coronary artery disease    LHC 10/15/11: LAD stent patent with mid ectasia, distal LAD 50-60%, mid circumflex with ectasia, EF 55-65%.  . Dyslipidemia   . GERD (gastroesophageal reflux disease)   . Glucose intolerance (pre-diabetes)    Borderline glucose intolerance  . Hypertension   . Hypothyroidism   . Post concussive syndrome 2011   "for ~ 6-8 months"  . Rotator cuff tear   . Shortness of breath 10/15/11   "here lately I've been having it on & off any time"  . SVT (supraventricular tachycardia) ([place])    maintained on beta blocker    Patient Active Problem List   Diagnosis Date Noted  . Unstable angina ([place]) 11/05/2018  . Tachycardia 10/29/2018  . Pain in left hip 08/13/2018  . Hypothyroidism 11/04/2011  . SVT (supraventricular tachycardia) ([place]) 04/10/2011  . HTN (hypertension) 04/10/2011  . Dyslipidemia 04/10/2011  . Coronary atherosclerosis 11/25/2010  . GERD 11/25/2010  .  CONSTIPATION 11/25/2010  . CHEST PAIN UNSPECIFIED 11/25/2010  . PERSONAL HISTORY OF COLONIC POLYPS 11/25/2010    Past Surgical History:  Procedure Laterality Date  . BACK SURGERY    . CARDIAC CATHETERIZATION  12/19/08   NORMAL. EF 55%  . CARDIAC CATHETERIZATION  10/15/11  . CARDIOVASCULAR STRESS TEST  10/2010  . CARPAL TUNNEL RELEASE     bilaterally  . COLONOSCOPY W/ ENDOSCOPIC [place]    . CORONARY ANGIOPLASTY WITH STENT PLACEMENT  9/ 2005   . JOINT REPLACEMENT    . LEFT HEART CATH AND CORONARY ANGIOGRAPHY N/A 11/05/2018   Procedure: LEFT HEART CATH AND CORONARY ANGIOGRAPHY;  Surgeon: Martinique, Peter M, MD;  Location: [facility];  Service: Cardiovascular;  Laterality: N/A;  . LEFT HEART CATHETERIZATION WITH CORONARY ANGIOGRAM N/A 10/15/2011   Procedure: LEFT HEART CATHETERIZATION WITH CORONARY ANGIOGRAM;  Surgeon: Sherren Mocha, MD;  Location: [facility] CATH LAB;  Service: Cardiovascular;  Laterality: N/A;  . LUMBAR LAMINECTOMY  1970's  . NASAL SINUS SURGERY     x2  . reconstructive shoulder surgery     right  . REPLACEMENT TOTAL KNEE  12/2007   left  . RESECTION TUMOR CLAVICLE RADICAL     Open distal clavicle resection  . SHOULDER SURGERY  02/11/2011   right  . TRIGGER FINGER RELEASE     ? both hands    Current Outpatient Rx  . Order #:  [id number] Class: Historical Med  . Order #: [id number] Class: Historical Med  . Order #: [id number] Class: Normal  . Order #: [id number] Class: Normal  . Order #: [id number] Class: Historical Med  . Order #: [id number] Class: Normal  . Order #: [id number] Class: Historical Med  . Order #: [id number] Class: Historical Med  . Order #: [id number] Class: Historical Med  . Order #: [id number] Class: Normal  . Order #: [id number] Class: Historical Med  . Order #: [id number] Class: Historical Med  . Order #: [id number] Class: Normal  . Order #: [id number] Class: No Print  . Order #: [id number] Class: Normal  . Order #: [id number] Class: Normal  . Order #: [id number] Class: Historical  Med    Allergies Patient has no known allergies.  Family History  Problem Relation Age of Onset  . Stroke Mother   . Heart attack Father   . Diabetes Father   . Kidney failure Father     Social History Social History   Tobacco Use  . Smoking status: Former Smoker    Packs/day: 1.00    Years: 21.00    Pack years: 21.00    Types: Cigarettes    Last attempt to quit: 04/07/1976    Years since quitting: 42.8  . Smokeless tobacco: Never Used  Substance Use Topics  . Alcohol use: Yes    Comment: 10/15/11 "haven't drank in 15-20 years"  . Drug use: No    Review of Systems  All other systems negative except as documented in the HPI. All pertinent positives and negatives as reviewed in the HPI. ____________________________________________   PHYSICAL EXAM:  VITAL SIGNS: ED Triage Vitals [01/20/19 1711]  Enc Vitals Group     BP (!) 191/116     Pulse Rate (!) 109     Resp 16     Temp 97.8 F (36.6 C)     Temp Source Oral     SpO2 97 %     Weight 200 lb (90.7 kg)     Height 6' (1.829 m)    Constitutional: Alert and oriented. Well appearing and in no acute distress. Eyes: Conjunctivae are normal. PERRL. EOMI. Eyes sunken Head: Atraumatic. Nose: No congestion/rhinnorhea. Mouth/Throat: Mucous membranes are dry.  Oropharynx non-erythematous. Neck: No stridor.  No meningeal signs.   Cardiovascular: tachycardic rate, regular rhythm. Good peripheral circulation. Grossly normal heart sounds.   Respiratory: Normal respiratory effort.  No retractions. Lungs CTAB. Gastrointestinal: Soft and mild diffuse ttp. mild distention.  Musculoskeletal: No lower extremity tenderness nor edema. No gross deformities of extremities. Neurologic:  Normal speech and language. No gross focal neurologic deficits are appreciated.  Skin:  Skin is warm, dry and intact. No rash noted.   ____________________________________________   LABS (all labs ordered are listed, but only abnormal results  are displayed)  Labs Reviewed  CBC WITH DIFFERENTIAL/PLATELET - Abnormal; Notable for the following components:      Result Value   WBC 11.2 (*)    Neutro Abs 8.9 (*)    All other components within normal limits  COMPREHENSIVE METABOLIC PANEL - Abnormal; Notable for the following components:   CO2 21 (*)    Glucose, Bld 125 (*)    Total Bilirubin 1.4 (*)    GFR calc non Af Amer 56 (*)    All other components within normal limits  LIPASE, BLOOD  TROPONIN I   ____________________________________________  EKG   EKG Interpretation  Date/Time:  Thursday January 20 2019 17:32:59 EDT Ventricular Rate:  111 PR Interval:    QRS Duration: 121 QT Interval:  385 QTC  Calculation: 524 R Axis:   -47 Text Interpretation:  Sinus tachycardia with irregular rate Nonspecific IVCD with LAD Left ventricular hypertrophy Anterior Q waves, possibly due to LVH tachy new Confirmed by Merrily Pew [phone]) on 01/20/2019 5:43:05 PM       EKG Interpretation  Date/Time:  Thursday January 20 2019 22:48:12 EDT Ventricular Rate:  104 PR Interval:    QRS Duration: 120 QT Interval:  363 QTC Calculation: 478 R Axis:   -39 Text Interpretation:  Sinus tachycardia Multiple premature complexes, vent & supraven Incomplete left bundle branch block Anterior Q waves, possibly due to ILBBB similar to previous Confirmed by Merrily Pew [phone]) on 01/20/2019 10:49:50 PM      ____________________________________________  RADIOLOGY  Ct Abdomen Pelvis W Contrast  Result Date: 01/20/2019 CLINICAL DATA:  Acute abdominal pain. Diarrhea for 3 weeks. Vomiting. EXAM: CT ABDOMEN AND PELVIS WITH CONTRAST TECHNIQUE: Multidetector CT imaging of the abdomen and pelvis was performed using the standard protocol following bolus administration of intravenous contrast. CONTRAST:  124mL OMNIPAQUE IOHEXOL 300 MG/ML  SOLN COMPARISON:  CT 12/31/2018 FINDINGS: Lower chest: Bibasilar atelectasis or scarring, right greater than left.  Hepatobiliary: No suspicious hepatic lesion. 12 mm gallstone in the gallbladder. Mild gallbladder distention without gallbladder inflammation by CT. No biliary dilatation. Pancreas: Pancreatic head calcifications. No ductal dilatation or inflammation. Spleen: Subcentimeter low-density in the medial spleen, nonspecific but typically benign. Spleen is normal in size. Adrenals/Urinary Tract: Normal adrenal glands. No hydronephrosis or perinephric edema. Homogeneous renal enhancement with symmetric excretion on delayed phase imaging. 4.9 cm simple cyst in the lower right kidney again seen. Urinary bladder is physiologically distended. Equivocal wall thickening about the dome. Stomach/Bowel: The stomach is decompressed. Small duodenal diverticulum again seen. Small bowel otherwise unremarkable. No obstruction or inflammation. Normal appendix. Small volume of stool throughout the colon. No colonic inflammation or wall thickening. Proximal sigmoid colon approaches left inguinal hernia without definite bowel involvement. No evidence of liquid stool. Vascular/Lymphatic: Aortic atherosclerosis and tortuosity. No aneurysm. No enlarged lymph nodes in the abdomen or pelvis. Reproductive: Prominent prostate gland 5 cm. Other: Left inguinal hernia contains fat. Proximal sigmoid colon approaches hernia without bowel involvement. No ascites, free air, or intra-abdominal abscess. Musculoskeletal: Degenerative change in the spine. Few scattered bone islands in the pelvis. IMPRESSION: 1. Gallstone. Mild gallbladder distention but no pericholecystic inflammation on CT. If there is clinical concern for acute cholecystitis, recommend right upper quadrant ultrasound. 2. Equivocal bladder wall thickening about the dome. 3. No bowel inflammation or explanation for diarrhea. 4. Left inguinal hernia contains fat. Aortic Atherosclerosis (ICD10-I70.0). Electronically Signed   By: Keith Rake M.D.   On: 01/20/2019 19:16   US Abdomen  Limited Ruq  Result Date: 01/20/2019 CLINICAL DATA:  Initial evaluation for acute vomiting, right upper quadrant pain. EXAM: ULTRASOUND ABDOMEN LIMITED RIGHT UPPER QUADRANT COMPARISON:  Prior CT from earlier the same day. FINDINGS: Gallbladder: Gallbladder somewhat distended. Gallbladder wall mildly thickened up to 5.1 mm in thickness. 1.6 cm stone seen within the gallbladder lumen. No free pericholecystic fluid. No sonographic Murphy sign elicited on exam. Common bile duct: Diameter: 3.3 mm Liver: No focal lesion identified. Liver demonstrates a mildly coarse and heterogeneous echotexture. Portal vein is patent on color Doppler imaging with normal direction of blood flow towards the liver. IMPRESSION: 1. Cholelithiasis with mild gallbladder wall thickening. No other sonographic features to suggest acute cholecystitis. 2. No biliary dilatation. Electronically Signed   By: Jeannine Boga M.D.   On: 01/20/2019 21:04  ____________________________________________   PROCEDURES  Procedure(s) performed:   Procedures   ____________________________________________   INITIAL IMPRESSION / ASSESSMENT AND PLAN / ED COURSE  We will evaluate for bowel obstruction versus gastroenteritis.  Symptomatic treatment the meantime.  Emesis improved. Possibly biliary colic? Tolerating PO. No cholecystitis, will fu w/ PCP. rx for zofran given. Stable for dc at thist ime.      Pertinent labs & imaging results that were available during my care of the patient were reviewed by me and considered in my medical decision making (see chart for details).  ____________________________________________  FINAL CLINICAL IMPRESSION(S) / ED DIAGNOSES  Final diagnoses:  Vomiting  Calculus of gallbladder without cholecystitis without obstruction     MEDICATIONS GIVEN DURING THIS VISIT:  Medications  lactated ringers bolus 1,000 mL (0 mLs Intravenous Stopped 01/20/19 1842)  ondansetron (ZOFRAN) injection 4 mg  (4 mg Intravenous Given 01/20/19 1739)  iohexol (OMNIPAQUE) 300 MG/ML solution 100 mL (100 mLs Intravenous Contrast Given 01/20/19 1851)     NEW OUTPATIENT MEDICATIONS STARTED DURING THIS VISIT:  New Prescriptions   ONDANSETRON (ZOFRAN) 4 MG TABLET    Take 1 tablet (4 mg total) by mouth every 8 (eight) hours as needed for nausea or vomiting.    Note:  This note was prepared with assistance of Dragon voice recognition software. Occasional wrong-word or sound-a-like substitutions [place] have occurred due to the inherent limitations of voice recognition software.   Merrily Pew, MD 01/20/19 [phone]

## 2019-01-20 NOTE — ED Triage Notes (Signed)
Pt was at his Drs office with complaints of diarrhea X3 weeks and vomiting off and on for same amount of time. Pt reports vomiting 2-3X today. Pt is alert and oriented X4

## 2019-02-16 DIAGNOSIS — D510 Vitamin B12 deficiency anemia due to intrinsic factor deficiency: Secondary | ICD-10-CM | POA: Diagnosis not present

## 2019-03-01 DIAGNOSIS — R112 Nausea with vomiting, unspecified: Secondary | ICD-10-CM | POA: Diagnosis not present

## 2019-03-01 DIAGNOSIS — K8020 Calculus of gallbladder without cholecystitis without obstruction: Secondary | ICD-10-CM | POA: Diagnosis not present

## 2019-03-01 DIAGNOSIS — I129 Hypertensive chronic kidney disease with stage 1 through stage 4 chronic kidney disease, or unspecified chronic kidney disease: Secondary | ICD-10-CM | POA: Diagnosis not present

## 2019-03-01 DIAGNOSIS — N183 Chronic kidney disease, stage 3 (moderate): Secondary | ICD-10-CM | POA: Diagnosis not present

## 2019-03-01 DIAGNOSIS — R634 Abnormal weight loss: Secondary | ICD-10-CM | POA: Diagnosis not present

## 2019-03-01 DIAGNOSIS — R109 Unspecified abdominal pain: Secondary | ICD-10-CM | POA: Diagnosis not present

## 2019-03-07 ENCOUNTER — Ambulatory Visit: Payer: Self-pay | Admitting: Surgery

## 2019-03-07 DIAGNOSIS — K8010 Calculus of gallbladder with chronic cholecystitis without obstruction: Secondary | ICD-10-CM | POA: Diagnosis not present

## 2019-03-07 NOTE — H&P (View-Only) (Signed)
Johnny [name] Documented: 03/07/2019 2:46 PM Location: [facility] Patient #: [id number] DOB: 12/22/1940 Married / Language: English / Race: White Male  History of Present Illness [name] MD; 03/07/2019 4:14 PM) The patient is a 78 year old male who presents for evaluation of gall stones. Note for "Gall stones": ` ` ` Patient sent for surgical consultation at the request of Dr Reynaldo Minium  Chief Complaint: Nausea vomiting and abdominal pain and failure to thrive. Probable symptomatically gallstones and cholecystitis ` ` The patient is an elderly gentleman that's been struggling with nausea vomiting and pain. He comes a with his daughter. She lives in [place] but keeps close tabs on her parents. Patient has had gradual decline over the past 6 months with decreased appetite and energy. His hospital in January with some epigastric and chest pain. Workup negative for PE. Negative for myocardial infarction. Question of flutter. Had close outpatient follow-up with catheterization. No evidence of any coronary disease anymore. He's had stenting in the past. Last in 2005. He is on Plavix anticoagulation. Had catheterization in 2012 with question of stent but at least was patent. Reassured. Walking with a cane more. About 20 pounds of unintentional weight loss. Claims he doesn't have much of an appetite. Gets a lot of indigestion and belching and burping with eating. Increasing nausea and bloating, tightness. Intermittent emesis. Had a period of a lot of loose bowel movements that concerned his daughter. Worsened. Went to the emergency room. CT scan showed an obvious gallstone but no definite cholecystitis. No evidence of bowel obstruction or gastroenteritis. I held off on any surgical consultation given the recent Covid pandemic. However despite switching to low-fat diet he still getting intermittent tacks. A second concerns they reconsidered consultation.   Patient  denies any abdominal surgery. Has not smoked tobacco in 40 years. Normally was rather physically active and independent until the past 6 months. No personal nor family history of GI/colon cancer, inflammatory bowel disease, irritable bowel syndrome, allergy such as Celiac Sprue, dietary/dairy problems, colitis, ulcers nor gastritis. No recent sick contacts/gastroenteritis. No travel outside the country. No changes in diet. No dysphagia to solids or liquids. No significant heartburn or reflux. No hematochezia, hematemesis, coffee ground emesis. No evidence of prior gastric/peptic ulceration.    (Review of systems as stated in this history (HPI) or in the review of systems. Otherwise all other 12 point ROS are negative) ` ` `   Past Surgical History Illene Regulus, CMA; 03/07/2019 2:46 PM) Knee Surgery Left. Shoulder Surgery Right. Spinal Surgery - Lower Back  Diagnostic Studies History Illene Regulus, CMA; 03/07/2019 2:46 PM) Colonoscopy 5-10 years ago  Allergies [name], CMA; 03/07/2019 2:47 PM) No Known Drug Allergies [03/07/2019]:  Medication History ([name], CMA; 03/07/2019 2:48 PM) Acetaminophen-Codeine #3 (300-30MG  Tablet, Oral) Active. Atorvastatin Calcium (10MG  Tablet, Oral) Active. Clopidogrel Bisulfate (75MG  Tablet, Oral) Active. DULoxetine HCl (30MG  Capsule DR Part, Oral) Active. hydrOXYzine HCl (25MG  Tablet, Oral) Active. Levothyroxine Sodium (25MCG Tablet, Oral) Active. Lisinopril (20MG  Tablet, Oral) Active. Magnesium Oxide (400MG  Tablet, Oral) Active. Metoprolol Tartrate (50MG  Tablet, Oral) Active. Metoprolol Tartrate (75MG  Tablet, Oral) Active. Ondansetron HCl (4MG  Tablet, Oral) Active. Pantoprazole Sodium (40MG  Tablet DR, Oral) Active. Medications Reconciled  Social History Illene Regulus, CMA; 03/07/2019 2:46 PM) Alcohol use Remotely quit alcohol use. Caffeine use Carbonated beverages, Tea. No drug use Tobacco  use Former smoker.  Family History Illene Regulus, [place]; 03/07/2019 2:46 PM) Alcohol Abuse Father. Cerebrovascular Accident Mother. Diabetes Mellitus Father. Hypertension Father, Mother.  Other Problems Illene Regulus, CMA; 03/07/2019 2:46 PM) Arthritis Back Pain Cholelithiasis Hemorrhoids High blood pressure     Review of Systems ([name] CMA; 03/07/2019 2:46 PM) General Present- Appetite Loss, Fatigue and Weight Loss. Not Present- Chills, Fever, Night Sweats and Weight Gain. Skin Present- Jaundice. Not Present- Change in Wart/Mole, Dryness, Hives, New Lesions, Non-Healing Wounds, Rash and Ulcer. HEENT Present- Visual Disturbances and Wears glasses/contact lenses. Not Present- Earache, Hearing Loss, Hoarseness, Nose Bleed, Oral Ulcers, Ringing in the Ears, Seasonal Allergies, Sinus Pain, Sore Throat and Yellow Eyes. Respiratory Present- Snoring. Not Present- Bloody sputum, Chronic Cough, Difficulty Breathing and Wheezing. Breast Not Present- Breast Mass, Breast Pain, Nipple Discharge and Skin Changes. Cardiovascular Present- Leg Cramps. Not Present- Chest Pain, Difficulty Breathing Lying Down, Palpitations, Rapid Heart Rate, Shortness of Breath and Swelling of Extremities. Gastrointestinal Present- Abdominal Pain, Chronic diarrhea, Excessive gas, Hemorrhoids, Indigestion, Nausea and Vomiting. Not Present- Bloating, Bloody Stool, Change in Bowel Habits, Constipation, Difficulty Swallowing, Gets full quickly at meals and Rectal Pain. Male Genitourinary Present- Blood in Urine. Not Present- Change in Urinary Stream, Frequency, Impotence, Nocturia, Painful Urination, Urgency and Urine Leakage. Musculoskeletal Present- Joint Pain, Joint Stiffness, Muscle Pain and Muscle Weakness. Not Present- Back Pain and Swelling of Extremities. Neurological Present- Decreased Memory, Headaches, Numbness, Tremor, Trouble walking and Weakness. Not Present- Fainting, Seizures and Tingling.  Psychiatric Present- Change in Sleep Pattern and Frequent crying. Not Present- Anxiety, Bipolar, Depression and Fearful. Hematology Present- Blood Thinners and Easy Bruising. Not Present- Excessive bleeding, Gland problems, HIV and Persistent Infections.  Vitals ([name] CMA; 03/07/2019 2:47 PM) 03/07/2019 2:47 PM Weight: 189 lb Height: 72in Body Surface Area: 2.08 m Body Mass Index: 25.63 kg/m  Pulse: 72 (Regular)  BP: 122/82 (Sitting, Left Arm, Standard)      Physical Exam [name] MD; 03/07/2019 4:06 PM)  General Mental Status-Alert. General Appearance-Not in acute distress, Not Sickly. Orientation-Oriented X3. Hydration-Well hydrated. Voice-Normal. Note: Not toxic or sickly. Occasionally mildly confused. Occasionally delays and answering questions. Somewhat deferential to her daughter.  Integumentary Global Assessment Upon inspection and palpation of skin surfaces of the - Axillae: non-tender, no inflammation or ulceration, no drainage. and Distribution of scalp and body hair is normal. General Characteristics Temperature - normal warmth is noted.  Head and Neck Head-normocephalic, atraumatic with no lesions or palpable masses. Face Global Assessment - atraumatic, no absence of expression. Neck Global Assessment - no abnormal movements, no bruit auscultated on the right, no bruit auscultated on the left, no decreased range of motion, non-tender. Trachea-midline. Thyroid Gland Characteristics - non-tender.  Eye Eyeball - Left-Extraocular movements intact, No Nystagmus. Eyeball - Right-Extraocular movements intact, No Nystagmus. Cornea - Left-No Hazy. Cornea - Right-No Hazy. Sclera/Conjunctiva - Left-No scleral icterus, No Discharge. Sclera/Conjunctiva - Right-No scleral icterus, No Discharge. Pupil - Left-Direct reaction to light normal. Pupil - Right-Direct reaction to light normal. Note: Wears glasses.  Vision corrected  ENMT Ears Pinna - Left - no drainage observed, no generalized tenderness observed. Right - no drainage observed, no generalized tenderness observed. Nose and Sinuses External Inspection of the Nose - no destructive lesion observed. Inspection of the nares - Left - quiet respiration. Right - quiet respiration. Mouth and Throat Lips - Upper Lip - no fissures observed, no pallor noted. Lower Lip - no fissures observed, no pallor noted. Nasopharynx - no discharge present. Oral Cavity/Oropharynx - Tongue - no dryness observed. Oral Mucosa - no cyanosis observed. Hypopharynx - no evidence of airway distress observed.  Chest and Lung Exam Inspection Movements - Normal and Symmetrical. Accessory muscles - No use of accessory muscles in breathing. Palpation Palpation of the chest reveals - Non-tender. Auscultation Breath sounds - Normal and Clear.  Cardiovascular Auscultation Rhythm - Regular. Murmurs & Other Heart Sounds - Auscultation of the heart reveals - No Murmurs and No Systolic Clicks.  Abdomen Inspection Inspection of the abdomen reveals - No Visible peristalsis and No Abnormal pulsations. Umbilicus - No Bleeding, No Urine drainage. Palpation/Percussion Palpation and Percussion of the abdomen reveal - Soft, Non Tender, No Rebound tenderness, No Rigidity (guarding) and No Cutaneous hyperesthesia. Note: Abdomen soft. Nontender. Not distended. Discomfort in right upper quadrant but no true Murphy sign. Left upper quadrant nontender. Lower abdomen nontender. No umbilical or incisional hernias. No guarding.  Male Genitourinary Sexual Maturity Tanner 5 - Adult hair pattern and Adult penile size and shape. Note: Mild inguinal impulses with cough only. Certainly no chronic large inguinal hernia. Normal external genitalia. No inguinal lymphadenopathy.  Peripheral Vascular Upper Extremity Inspection - Left - No Cyanotic nailbeds, Not Ischemic. Right - No Cyanotic  nailbeds, Not Ischemic.  Neurologic Neurologic evaluation reveals -normal attention span and ability to concentrate, able to name objects and repeat phrases. Appropriate fund of knowledge , normal sensation and normal coordination. Mental Status Affect - not angry, not paranoid. Cranial Nerves-Normal Bilaterally. Gait-Normal.  Neuropsychiatric Mental status exam performed with findings of-able to articulate well with normal speech/language, rate, volume and coherence, thought content normal with ability to perform basic computations and apply abstract reasoning and no evidence of hallucinations, delusions, obsessions or homicidal/suicidal ideation. Note: Walks with cane for balance. A little slow to transition from sitting to standing. Mildly unsteady  Musculoskeletal Global Assessment Spine, Ribs and Pelvis - no instability, subluxation or laxity. Right Upper Extremity - no instability, subluxation or laxity.  Lymphatic Head & Neck  General Head & Neck Lymphatics: Bilateral - Description - No Localized lymphadenopathy. Axillary  General Axillary Region: Bilateral - Description - No Localized lymphadenopathy. Femoral & Inguinal  Generalized Femoral & Inguinal Lymphatics: Left - Description - No Localized lymphadenopathy. Right - Description - No Localized lymphadenopathy.    Assessment & Plan [name] MD; 03/07/2019 4:15 PM)  CHRONIC CHOLECYSTITIS WITH CALCULUS (K80.10) Impression: Patient with postprandial nausea vomiting and right upper quadrant pain suspicious for symptomatic gallstones and chronic cholecystitis.  History of coronary disease status post stenting in the distant past with no evidence of any recurrent occlusive disease on angiography by Dr. Martinique January 2020. Nothing else definite on differential diagnosis to explain his symptoms.  I think he would benefit from cholecystectomy. Reasonable start as a single site with low threshold to do standard  four-port. Probably at least watch overnight. His risks for surgery increased given his age and deconditioned state. Would like cardiac clearance. He is seen by cardiology every other month. Coronary angiography rather underwhelming 4 months ago. Need to make sure it safe to come off Plavix. I do worry declined further after surgery and I don't know how well he'll recover from his gradual six-month declined. However, he is worsening with delay with persistent attacks. Would be helpful to do surgery to break the cycle diagnosis  He could have delayed gastric emptying, but he is not a diabetic and is not on any chronic narcotics nor with any major neurological issues. His story otherwise seems rather classic for biliary colic. Should this not improve his symptoms are still has persistent abdominal complaints despite a good bowel  regimen and gallbladder removed, consider gastrology follow-up. Hold off for now. Patient and his daughter wish to be aggressive and proceed with cholecystectomy first.  He has some mild confusion but has good long-term memory. His daughter is very involved. She's help arrange home health nursing care can help take care of them at least part of the weekdays, especially since his wife is needing help as well. Can increase more if needed.  Current Plans You are being scheduled for surgery- Our schedulers will call you.  You should hear from our office's scheduling department within 5 working days about the location, date, and time of surgery. We try to make accommodations for patient's preferences in scheduling surgery, but sometimes the OR schedule or the surgeon's schedule prevents [place] from making those accommodations.  If you have not heard from our office [phone]) in 5 working days, call the office and ask for your surgeon's nurse.  If you have other questions about your diagnosis, plan, or surgery, call the office and ask for your surgeon's nurse.  Written  instructions provided Pt Education - Pamphlet Given - Laparoscopic Gallbladder Surgery: discussed with patient and provided information. The anatomy & physiology of hepatobiliary & pancreatic function was discussed. The pathophysiology of gallbladder dysfunction was discussed. Natural history risks without surgery was discussed. I feel the risks of no intervention will lead to serious problems that outweigh the operative risks; therefore, I recommended cholecystectomy to remove the pathology. I explained laparoscopic techniques with possible need for an open approach. Probable cholangiogram to evaluate the bilary tract was explained as well.  Risks such as bleeding, infection, abscess, leak, injury to other organs, need for further treatment, heart attack, death, and other risks were discussed. I noted a good likelihood this will help address the problem. Possibility that this will not correct all abdominal symptoms was explained. Goals of post-operative recovery were discussed as well. We will work to minimize complications. An educational handout further explaining the pathology and treatment options was given as well. Questions were answered. The patient expresses understanding & wishes to proceed with surgery.  Pt Education - [facility] Laparosopic Post Op HCI (Ciarrah Rae) Pt Education - [facility] Good Bowel Health ([facility]) Pt Education - Laparoscopic Cholecystectomy: gallbladder I recommended obtaining preoperative cardiac clearance for recommendations on management of anticoagualtion perioperatively:  1. Timing of holding anticoagulation 2. Need for any bridge therapy (SQ enoxaparin, IV heparin, IV Aggrastat, etc) preop/postop. 3. Desired timing of resumption of anticoagulation.  In general from Dr. Johney Maine' standpoint:  Aspirin is okay to continue perioperatively (81mg  or 325mg ) & does not need to be held  Hold warfarin 5 dayspreoperatively. Consider PT/INR level on arrival to  short stay the day of surgery. No need to check PT/INR level on preop visit  Hold P2Y12 inibitors such as clopidrogel (Plavix) 4 dayspreoperatively  Hold direct thrombin / factor Xa inhibitors (Xerelto, Pradaxa, Eliquis, etc) 2 dayspreoperatively   Request clearance by cardiology to better assess operative risk & see if a reevaluation, further workup, etc is needed. Also recommendations on how medications such as for anticoagulation and blood pressure should be managed/held/restarted after surgery.  [name], MD, FACS, MASCRS [facility]    [address], [place], [place] [address] [phone] Main / Paging [phone] Fax

## 2019-03-07 NOTE — H&P (Signed)
Johnny [name] Documented: 03/07/2019 2:46 PM Location: [facility] Patient #: [id number] DOB: 09/06/1941 Married / Language: English / Race: White Male  History of Present Illness Johnny [name]; 03/07/2019 4:14 PM) The patient is a 78 year old male who presents for evaluation of gall stones. Note for "Gall stones": ` ` ` Patient sent for surgical consultation at the request of Dr Reynaldo Minium  Chief Complaint: Nausea vomiting and abdominal pain and failure to thrive. Probable symptomatically gallstones and cholecystitis ` ` The patient is an elderly gentleman that's been struggling with nausea vomiting and pain. He comes a with his daughter. She lives in [place] but keeps close tabs on her parents. Patient has had gradual decline over the past 6 months with decreased appetite and energy. His hospital in January with some epigastric and chest pain. Workup negative for PE. Negative for myocardial infarction. Question of flutter. Had close outpatient follow-up with catheterization. No evidence of any coronary disease anymore. He's had stenting in the past. Last in 2005. He is on Plavix anticoagulation. Had catheterization in 2012 with question of stent but at least was patent. Reassured. Walking with a cane more. About 20 pounds of unintentional weight loss. Claims he doesn't have much of an appetite. Gets a lot of indigestion and belching and burping with eating. Increasing nausea and bloating, tightness. Intermittent emesis. Had a period of a lot of loose bowel movements that concerned his daughter. Worsened. Went to the emergency room. CT scan showed an obvious gallstone but no definite cholecystitis. No evidence of bowel obstruction or gastroenteritis. I held off on any surgical consultation given the recent Covid pandemic. However despite switching to low-fat diet he still getting intermittent tacks. A second concerns they reconsidered consultation.   Patient  denies any abdominal surgery. Has not smoked tobacco in 40 years. Normally was rather physically active and independent until the past 6 months. No personal nor family history of GI/colon cancer, inflammatory bowel disease, irritable bowel syndrome, allergy such as Celiac Sprue, dietary/dairy problems, colitis, ulcers nor gastritis. No recent sick contacts/gastroenteritis. No travel outside the country. No changes in diet. No dysphagia to solids or liquids. No significant heartburn or reflux. No hematochezia, hematemesis, coffee ground emesis. No evidence of prior gastric/peptic ulceration.    (Review of systems as stated in this history (HPI) or in the review of systems. Otherwise all other 12 point ROS are negative) ` ` `   Past Surgical History Illene [name], CMA; 03/07/2019 2:46 PM) Knee Surgery Left. Shoulder Surgery Right. Spinal Surgery - Lower Back  Diagnostic Studies History Illene [name], CMA; 03/07/2019 2:46 PM) Colonoscopy 5-10 years ago  Allergies Johnny [name], CMA; 03/07/2019 2:47 PM) No Known Drug Allergies [03/07/2019]:  Medication History (Johnny [name], CMA; 03/07/2019 2:48 PM) Acetaminophen-Codeine #3 (300-30MG  Tablet, Oral) Active. Atorvastatin Calcium (10MG  Tablet, Oral) Active. Clopidogrel Bisulfate (75MG  Tablet, Oral) Active. DULoxetine HCl (30MG  Capsule DR Part, Oral) Active. hydrOXYzine HCl (25MG  Tablet, Oral) Active. Levothyroxine Sodium (25MCG Tablet, Oral) Active. Lisinopril (20MG  Tablet, Oral) Active. Magnesium Oxide (400MG  Tablet, Oral) Active. Metoprolol Tartrate (50MG  Tablet, Oral) Active. Metoprolol Tartrate (75MG  Tablet, Oral) Active. Ondansetron HCl (4MG  Tablet, Oral) Active. Pantoprazole Sodium (40MG  Tablet DR, Oral) Active. Medications Reconciled  Social History Illene [name], CMA; 03/07/2019 2:46 PM) Alcohol use Remotely quit alcohol use. Caffeine use Carbonated beverages, Tea. No drug use Tobacco  use Former smoker.  Family History Illene [name], [place]; 03/07/2019 2:46 PM) Alcohol Abuse Father. Cerebrovascular Accident Mother. Diabetes Mellitus Father. Hypertension Father, Mother.  Other Problems Illene [name], CMA; 03/07/2019 2:46 PM) Arthritis Back Pain Cholelithiasis Hemorrhoids High blood pressure     Review of Systems (Johnny [name] CMA; 03/07/2019 2:46 PM) General Present- Appetite Loss, Fatigue and Weight Loss. Not Present- Chills, Fever, Night Sweats and Weight Gain. Skin Present- Jaundice. Not Present- Change in Wart/Mole, Dryness, Hives, New Lesions, Non-Healing Wounds, Rash and Ulcer. HEENT Present- Visual Disturbances and Wears glasses/contact lenses. Not Present- Earache, Hearing Loss, Hoarseness, Nose Bleed, Oral Ulcers, Ringing in the Ears, Seasonal Allergies, Sinus Pain, Sore Throat and Yellow Eyes. Respiratory Present- Snoring. Not Present- Bloody sputum, Chronic Cough, Difficulty Breathing and Wheezing. Breast Not Present- Breast Mass, Breast Pain, Nipple Discharge and Skin Changes. Cardiovascular Present- Leg Cramps. Not Present- Chest Pain, Difficulty Breathing Lying Down, Palpitations, Rapid Heart Rate, Shortness of Breath and Swelling of Extremities. Gastrointestinal Present- Abdominal Pain, Chronic diarrhea, Excessive gas, Hemorrhoids, Indigestion, Nausea and Vomiting. Not Present- Bloating, Bloody Stool, Change in Bowel Habits, Constipation, Difficulty Swallowing, Gets full quickly at meals and Rectal Pain. Male Genitourinary Present- Blood in Urine. Not Present- Change in Urinary Stream, Frequency, Impotence, Nocturia, Painful Urination, Urgency and Urine Leakage. Musculoskeletal Present- Joint Pain, Joint Stiffness, Muscle Pain and Muscle Weakness. Not Present- Back Pain and Swelling of Extremities. Neurological Present- Decreased Memory, Headaches, Numbness, Tremor, Trouble walking and Weakness. Not Present- Fainting, Seizures and Tingling.  Psychiatric Present- Change in Sleep Pattern and Frequent crying. Not Present- Anxiety, Bipolar, Depression and Fearful. Hematology Present- Blood Thinners and Easy Bruising. Not Present- Excessive bleeding, Gland problems, HIV and Persistent Infections.  Vitals (Johnny [name] CMA; 03/07/2019 2:47 PM) 03/07/2019 2:47 PM Weight: 189 lb Height: 72in Body Surface Area: 2.08 m Body Mass Index: 25.63 kg/m  Pulse: 72 (Regular)  BP: 122/82 (Sitting, Left Arm, Standard)      Physical Exam Johnny [name]; 03/07/2019 4:06 PM)  General Mental Status-Alert. General Appearance-Not in acute distress, Not Sickly. Orientation-Oriented X3. Hydration-Well hydrated. Voice-Normal. Note: Not toxic or sickly. Occasionally mildly confused. Occasionally delays and answering questions. Somewhat deferential to her daughter.  Integumentary Global Assessment Upon inspection and palpation of skin surfaces of the - Axillae: non-tender, no inflammation or ulceration, no drainage. and Distribution of scalp and body hair is normal. General Characteristics Temperature - normal warmth is noted.  Head and Neck Head-normocephalic, atraumatic with no lesions or palpable masses. Face Global Assessment - atraumatic, no absence of expression. Neck Global Assessment - no abnormal movements, no bruit auscultated on the right, no bruit auscultated on the left, no decreased range of motion, non-tender. Trachea-midline. Thyroid Gland Characteristics - non-tender.  Eye Eyeball - Left-Extraocular movements intact, No Nystagmus. Eyeball - Right-Extraocular movements intact, No Nystagmus. Cornea - Left-No Hazy. Cornea - Right-No Hazy. Sclera/Conjunctiva - Left-No scleral icterus, No Discharge. Sclera/Conjunctiva - Right-No scleral icterus, No Discharge. Pupil - Left-Direct reaction to light normal. Pupil - Right-Direct reaction to light normal. Note: Wears glasses.  Vision corrected  ENMT Ears Pinna - Left - no drainage observed, no generalized tenderness observed. Right - no drainage observed, no generalized tenderness observed. Nose and Sinuses External Inspection of the Nose - no destructive lesion observed. Inspection of the nares - Left - quiet respiration. Right - quiet respiration. Mouth and Throat Lips - Upper Lip - no fissures observed, no pallor noted. Lower Lip - no fissures observed, no pallor noted. Nasopharynx - no discharge present. Oral Cavity/Oropharynx - Tongue - no dryness observed. Oral Mucosa - no cyanosis observed. Hypopharynx - no evidence of airway distress observed.  Chest and Lung Exam Inspection Movements - Normal and Symmetrical. Accessory muscles - No use of accessory muscles in breathing. Palpation Palpation of the chest reveals - Non-tender. Auscultation Breath sounds - Normal and Clear.  Cardiovascular Auscultation Rhythm - Regular. Murmurs & Other Heart Sounds - Auscultation of the heart reveals - No Murmurs and No Systolic Clicks.  Abdomen Inspection Inspection of the abdomen reveals - No Visible peristalsis and No Abnormal pulsations. Umbilicus - No Bleeding, No Urine drainage. Palpation/Percussion Palpation and Percussion of the abdomen reveal - Soft, Non Tender, No Rebound tenderness, No Rigidity (guarding) and No Cutaneous hyperesthesia. Note: Abdomen soft. Nontender. Not distended. Discomfort in right upper quadrant but no true Murphy sign. Left upper quadrant nontender. Lower abdomen nontender. No umbilical or incisional hernias. No guarding.  Male Genitourinary Sexual Maturity Tanner 5 - Adult hair pattern and Adult penile size and shape. Note: Mild inguinal impulses with cough only. Certainly no chronic large inguinal hernia. Normal external genitalia. No inguinal lymphadenopathy.  Peripheral Vascular Upper Extremity Inspection - Left - No Cyanotic nailbeds, Not Ischemic. Right - No Cyanotic  nailbeds, Not Ischemic.  Neurologic Neurologic evaluation reveals -normal attention span and ability to concentrate, able to name objects and repeat phrases. Appropriate fund of knowledge , normal sensation and normal coordination. Mental Status Affect - not angry, not paranoid. Cranial Nerves-Normal Bilaterally. Gait-Normal.  Neuropsychiatric Mental status exam performed with findings of-able to articulate well with normal speech/language, rate, volume and coherence, thought content normal with ability to perform basic computations and apply abstract reasoning and no evidence of hallucinations, delusions, obsessions or homicidal/suicidal ideation. Note: Walks with cane for balance. A little slow to transition from sitting to standing. Mildly unsteady  Musculoskeletal Global Assessment Spine, Ribs and Pelvis - no instability, subluxation or laxity. Right Upper Extremity - no instability, subluxation or laxity.  Lymphatic Head & Neck  General Head & Neck Lymphatics: Bilateral - Description - No Localized lymphadenopathy. Axillary  General Axillary Region: Bilateral - Description - No Localized lymphadenopathy. Femoral & Inguinal  Generalized Femoral & Inguinal Lymphatics: Left - Description - No Localized lymphadenopathy. Right - Description - No Localized lymphadenopathy.    Assessment & Plan Johnny [name]; 03/07/2019 4:15 PM)  CHRONIC CHOLECYSTITIS WITH CALCULUS (K80.10) Impression: Patient with postprandial nausea vomiting and right upper quadrant pain suspicious for symptomatic gallstones and chronic cholecystitis.  History of coronary disease status post stenting in the distant past with no evidence of any recurrent occlusive disease on angiography by Dr. Martinique January 2020. Nothing else definite on differential diagnosis to explain his symptoms.  I think he would benefit from cholecystectomy. Reasonable start as a single site with low threshold to do standard  four-port. Probably at least watch overnight. His risks for surgery increased given his age and deconditioned state. Would like cardiac clearance. He is seen by cardiology every other month. Coronary angiography rather underwhelming 4 months ago. Need to make sure it safe to come off Plavix. I do worry declined further after surgery and I don't know how well he'll recover from his gradual six-month declined. However, he is worsening with delay with persistent attacks. Would be helpful to do surgery to break the cycle diagnosis  He could have delayed gastric emptying, but he is not a diabetic and is not on any chronic narcotics nor with any major neurological issues. His story otherwise seems rather classic for biliary colic. Should this not improve his symptoms are still has persistent abdominal complaints despite a good bowel  regimen and gallbladder removed, consider gastrology follow-up. Hold off for now. Patient and his daughter wish to be aggressive and proceed with cholecystectomy first.  He has some mild confusion but has good long-term memory. His daughter is very involved. She's help arrange home health nursing care can help take care of them at least part of the weekdays, especially since his wife is needing help as well. Can increase more if needed.  Current Plans You are being scheduled for surgery- Our schedulers will call you.  You should hear from our office's scheduling department within 5 working days about the location, date, and time of surgery. We try to make accommodations for patient's preferences in scheduling surgery, but sometimes the OR schedule or the surgeon's schedule prevents [place] from making those accommodations.  If you have not heard from our office [phone]) in 5 working days, call the office and ask for your surgeon's nurse.  If you have other questions about your diagnosis, plan, or surgery, call the office and ask for your surgeon's nurse.  Written  instructions provided Pt Education - Pamphlet Given - Laparoscopic Gallbladder Surgery: discussed with patient and provided information. The anatomy & physiology of hepatobiliary & pancreatic function was discussed. The pathophysiology of gallbladder dysfunction was discussed. Natural history risks without surgery was discussed. I feel the risks of no intervention will lead to serious problems that outweigh the operative risks; therefore, I recommended cholecystectomy to remove the pathology. I explained laparoscopic techniques with possible need for an open approach. Probable cholangiogram to evaluate the bilary tract was explained as well.  Risks such as bleeding, infection, abscess, leak, injury to other organs, need for further treatment, heart attack, death, and other risks were discussed. I noted a good likelihood this will help address the problem. Possibility that this will not correct all abdominal symptoms was explained. Goals of post-operative recovery were discussed as well. We will work to minimize complications. An educational handout further explaining the pathology and treatment options was given as well. Questions were answered. The patient expresses understanding & wishes to proceed with surgery.  Pt Education - CCS Laparosopic Post Op HCI (Jeffrie Stander) Pt Education - [facility]CS Good Bowel Health (Jonte Wollam) Pt Education - Laparoscopic Cholecystectomy: gallbladder I recommended obtaining preoperative cardiac clearance for recommendations on management of anticoagualtion perioperatively:  1. Timing of holding anticoagulation 2. Need for any bridge therapy (SQ enoxaparin, IV heparin, IV Aggrastat, etc) preop/postop. 3. Desired timing of resumption of anticoagulation.  In general from Dr. Johney Maine' standpoint:  Aspirin is okay to continue perioperatively (81mg  or 325mg ) & does not need to be held  Hold warfarin 5 dayspreoperatively. Consider PT/INR level on arrival to  short stay the day of surgery. No need to check PT/INR level on preop visit  Hold P2Y12 inibitors such as clopidrogel (Plavix) 4 dayspreoperatively  Hold direct thrombin / factor Xa inhibitors (Xerelto, Pradaxa, Eliquis, etc) 2 dayspreoperatively   Request clearance by cardiology to better assess operative risk & see if a reevaluation, further workup, etc is needed. Also recommendations on how medications such as for anticoagulation and blood pressure should be managed/held/restarted after surgery.  Johnny Hector, [name], FACS, MASCRS Gastrointestinal and Minimally Invasive Surgery    [address], [place], [place] [address] [phone] Main / Paging [phone] Fax

## 2019-03-08 ENCOUNTER — Telehealth: Payer: Self-pay

## 2019-03-08 DIAGNOSIS — H5000 Unspecified esotropia: Secondary | ICD-10-CM | POA: Diagnosis not present

## 2019-03-08 NOTE — Telephone Encounter (Signed)
This encounter was created in error - please disregard.

## 2019-03-08 NOTE — Telephone Encounter (Signed)
   [facility] Pre-operative Risk Assessment    Request for surgical clearance:  1. What type of surgery is being performed? Lap cholecystectomy  2. When is this surgery scheduled? tbd  3. What type of clearance is required (medical clearance vs. Pharmacy clearance to hold med vs. Both)? Both  4. Are there any medications that need to be held prior to surgery and how long? Plavix  5. Practice name and name of physician performing surgery? [facility] - Dr. Michael Boston  6. What is your office phone number [phone]   7.   What is your office fax number       [phone]  8.   Anesthesia type (None, local, MAC, general) ? General   _0 @ _1 @, _2 @  _________________________________________________________________   (provider comments below)

## 2019-03-08 NOTE — Telephone Encounter (Signed)
Dr. Martinique Please comment on holding plavix. Last cath with nonobstructive disease.

## 2019-03-08 NOTE — Telephone Encounter (Signed)
surg

## 2019-03-08 NOTE — Telephone Encounter (Signed)
He can hold plavix for 5-6 days for surgery and should be cleared to proceed.  Peter Martinique MD, [facility]

## 2019-03-09 NOTE — Telephone Encounter (Signed)
   Primary Cardiologist: Peter Martinique, MD  Chart reviewed as part of pre-operative protocol coverage.  Johnny [name] was last seen on 11/25/18 by Dr. Martinique.    Per Dr. Martinique: He can hold plavix for 5-6 days for surgery and should be cleared to proceed.  Therefore, based on ACC/AHA guidelines, the patient would be at acceptable risk for the planned procedure without further cardiovascular testing.   I will route this recommendation to the requesting party via Epic fax function and remove from pre-op pool.  Please call with questions.  [place], PA 03/09/2019, 7:32 AM

## 2019-03-17 NOTE — Pre-Procedure Instructions (Signed)
Johnny [name]  03/17/2019    Your procedure is scheduled on Wednesday, Dolores 27, 2020 at 2:30 PM.   Report to [facility] Entrance "A" Admitting Office at 12:30 PM.   Call this number if you have problems the morning of surgery: [phone]   Questions prior to day of surgery, please call [phone] between 8 & 4 PM.   Remember:  Do not eat food after midnight Tuesday, 03/22/19.  You Karstens drink clear liquids until 11:30 AM .  Clear liquids allowed are:   Water, Juice (non-citric/no pulp), Carbonated beverages, clear tea, black coffee, plain Jello, Gatorade, plain Popsicles                     Take these medicines the morning of surgery with A SIP OF WATER: Acetaminophen (Tylenol), Aspirin, Atorvastatin (Lipitor), Levothyroxine (Synthroid), Metoprolol (Lopressor), Pantoprazole (Protonix), Anoro Ellipta inhaler - if needed.  Stop Plavix as instructed by Dr. Martinique. Stop NSAIDS (Voltaren, Diclofenac, Aleve, Ibuprofen, etc) and Herbal medications as of today. Do not use any other Aspirin products prior to surgery.    Do not wear jewelry.  Do not wear lotions, powders, cologne, or deodorant.  Do not shave 48 hours prior to surgery.  Men Wish shave face and neck.  Do not bring valuables to the hospital.  [facility] is not responsible for any belongings or valuables.  Contacts, dentures or bridgework Guevara not be worn into surgery.  Leave your suitcase in the car.  After surgery it Mccalister be brought to your room.  For patients admitted to the hospital, discharge time will be determined by your treatment team.  Patients discharged the day of surgery will not be allowed to drive home.   [facility] - Preparing for Surgery  Before surgery, you can play an important role.  Because skin is not sterile, your skin needs to be as free of germs as possible.  You can reduce the number of germs on you skin by washing with CHG (chlorahexidine gluconate) soap before surgery.  CHG is an antiseptic  cleaner which kills germs and bonds with the skin to continue killing germs even after washing.  Oral Hygiene is also important in reducing the risk of infection.  Remember to brush your teeth with your regular toothpaste the morning of surgery.  Please DO NOT use if you have an allergy to CHG or antibacterial soaps.  If your skin becomes reddened/irritated stop using the CHG and inform your nurse when you arrive at Short Stay.  Do not shave (including legs and underarms) for at least 48 hours prior to the first CHG shower.  You [place] shave your face.  Please follow these instructions carefully:   1.  Shower with CHG Soap the night before surgery and the morning of Surgery.  2.  If you choose to wash your hair, wash your hair first as usual with your normal shampoo.  3.  After you shampoo, rinse your hair and body thoroughly to remove the shampoo. 4.  Use CHG as you would any other liquid soap.  You can apply chg directly to the skin and wash gently with a      scrungie or washcloth.           5.  Apply the CHG Soap to your body ONLY FROM THE NECK DOWN.   Do not use on open wounds or open sores. Avoid contact with your eyes, ears, mouth and genitals (private parts).  Wash  genitals (private parts) with your normal soap.  6.  Wash thoroughly, paying special attention to the area where your surgery will be performed.  7.  Thoroughly rinse your body with warm water from the neck down.  8.  DO NOT shower/wash with your normal soap after using and rinsing off the CHG Soap.  9.  Pat yourself dry with a clean towel.            10.  Wear clean pajamas.            11.  Place clean sheets on your bed the night of your first shower and do not sleep with pets.  Day of Surgery  Shower as above. Do not apply any lotions/deodorants the morning of surgery.   Please wear clean clothes to the hospital. Remember to brush your teeth with toothpaste.   Please read over the fact sheets that you were  given.

## 2019-03-18 ENCOUNTER — Encounter (HOSPITAL_COMMUNITY)
Admission: RE | Admit: 2019-03-18 | Discharge: 2019-03-18 | Disposition: A | Discharge status: Home / Self Care | Payer: Medicare Other | Source: Ambulatory Visit | Attending: Surgery | Admitting: Surgery

## 2019-03-18 ENCOUNTER — Encounter (HOSPITAL_COMMUNITY): Payer: Self-pay

## 2019-03-18 ENCOUNTER — Other Ambulatory Visit (HOSPITAL_COMMUNITY)
Admission: RE | Admit: 2019-03-18 | Discharge: 2019-03-18 | Disposition: A | Discharge status: Home / Self Care | Payer: Medicare Other | Source: Ambulatory Visit | Attending: Surgery | Admitting: Surgery

## 2019-03-18 ENCOUNTER — Other Ambulatory Visit: Payer: Self-pay

## 2019-03-18 DIAGNOSIS — Z01812 Encounter for preprocedural laboratory examination: Secondary | ICD-10-CM | POA: Diagnosis not present

## 2019-03-18 DIAGNOSIS — Z1159 Encounter for screening for other viral diseases: Secondary | ICD-10-CM | POA: Diagnosis not present

## 2019-03-18 LAB — BASIC METABOLIC PANEL
Anion gap: 10 (ref 5–15)
BUN: 13 mg/dL (ref 8–23)
CO2: 22 mmol/L (ref 22–32)
Calcium: 9.5 mg/dL (ref 8.9–10.3)
Chloride: 107 mmol/L (ref 98–111)
Creatinine, Ser: 0.98 mg/dL (ref 0.61–1.24)
GFR calc Af Amer: >60 mL/min (ref >60)
GFR calc non Af Amer: >60 mL/min (ref >60)
Glucose, Bld: 123 mg/dL — ABNORMAL HIGH (ref 70–99)
Potassium: 3.9 mmol/L (ref 3.5–5.1)
Sodium: 139 mmol/L (ref 135–145)

## 2019-03-18 LAB — CBC
HCT: 46.3 % (ref 39.0–52.0)
Hemoglobin: 15.2 g/dL (ref 13.0–17.0)
MCH: 30.7 pg (ref 26.0–34.0)
MCHC: 32.8 g/dL (ref 30.0–36.0)
MCV: 93.5 fL (ref 80.0–100.0)
Platelets: 257 10*3/uL (ref 150–400)
RBC: 4.95 MIL/uL (ref 4.22–5.81)
RDW: 12.9 % (ref 11.5–15.5)
WBC: 8.4 10*3/uL (ref 4.0–10.5)
nRBC: 0 % (ref 0.0–0.2)

## 2019-03-18 NOTE — Progress Notes (Signed)
PCP - Dr. Reynaldo Minium  Cardiologist - Dr. Martinique  Chest x-ray - 10/29/18 EKG - 01/20/19 Stress Test - 10/10/10 ECHO -  Cardiac Cath - 11/05/18  Sleep Study - N/a CPAP - n/a  Fasting Blood Sugar - n/a Checks Blood Sugar _____ times a day  Blood Thinner Instructions: Instructed by Dr. Martinique to stop 5 days prior to surgery Aspirin Instructions: Dr. Elie Confer ordered pt to continue Aspirin  Anesthesia review: Yes (Note from Dr. Martinique in January stated getting an Echo on an outpatient basis, none ordered or found. Pt and daughter both do not remember hearing anything about needing to have an Echo done.   Patient denies shortness of breath, fever, cough and chest pain at PAT appointment   Patient verbalized understanding of instructions that were given to them at the PAT appointment. Patient was also instructed that they will need to review over the PAT instructions again at home before surgery.  Pt has an appt for Covid testing this afternoon. Pt understands he goes home after testing and does not go out until day of surgery. Pt's daughter is also aware of this.    Coronavirus Screening  Have you experienced the following symptoms:  Cough NO Fever (>100.26F)  NO Runny nose NO Sore throat NO Difficulty breathing/shortness of breath  NO Have you or a family member traveled in the last 14 days and where? NO   Patient reminded that hospital visitation restrictions are in effect and the importance of the restrictions.

## 2019-03-19 LAB — NOVEL CORONAVIRUS, NAA (HOSP ORDER, SEND-OUT TO REF LAB; TAT 18-24 HRS): SARS-CoV-2, NAA: NOT DETECTED

## 2019-03-22 MED ORDER — METRONIDAZOLE IN NACL 5-0.79 MG/ML-% IV SOLN
500.0000 mg | INTRAVENOUS | Status: AC
  Administered 2019-03-23: 17:00:00 500 mg via INTRAVENOUS
  Filled 2019-03-22: qty 100

## 2019-03-22 MED ORDER — BUPIVACAINE LIPOSOME 1.3 % IJ SUSP
20.0000 mL | INTRAMUSCULAR | Status: AC
  Administered 2019-03-23: 20 mL
  Filled 2019-03-22: qty 20

## 2019-03-22 MED ORDER — SODIUM CHLORIDE 0.9 % IV SOLN
2.0000 g | INTRAVENOUS | Status: AC
  Administered 2019-03-23: 16:00:00 2 g via INTRAVENOUS
  Filled 2019-03-22: qty 20

## 2019-03-22 NOTE — Progress Notes (Signed)
Anesthesia Chart Review:  Case:  [id number] Date/Time:  03/23/19 1415   Procedure:  SINGLE SITE LAPAROSCOPIC CHOLECYSTECTOMY WITH INTRAOPERATIVE CHOLANGIOGRAM (N/A )   Anesthesia type:  General   Pre-op diagnosis:  SYMPTOMATIC BILIARY COLIC, PROBABLE CHRONIC CHOLECYSTITIS   Location:  [facility] ROOM 08 / [facility]   Surgeon:  Michael Boston, MD      DISCUSSION: Patient is a 78 year old male scheduled for the above procedure.  History includes former smoker (quit 1977), CAD (s/p LAD stent 2005, non-obstructive CAD 10/2018 LHC), SVT (treated with b-blocker), dyslipidemia, HTN, GERD, glucose intolerance, hypothyroidism, dyspnea, post-concussive syndrome (~ 2011), anemia.   - ED visit 01/20/19 for emesis and tachycardia. Troponin WNL. CT showed some mild gallbladder distention, so symptoms thought possibly due to biliary colic. ED provider felt EKG showed ST with multiple premature complexes. He was treated symptomatically with out-patient PCP follow-up recommended. (Dr. Reynaldo Minium referred to general surgery for N/V, abdominal pain, and failure to thrive.)  - Admitted 10/29/18-10/30/18 for chest pain (similar to prior MI). Also more "fluttering" and dyspnea on exertion noted. Troponins negative x3. LHC deferred due to elevated creatinine (1.79). BNP 56. TSH normal. Dimer negative and VQ scan low risk for PE. Known SVT, so with more symptomatic episodes, metoprolol increased. Lisinopril and HCTZ held due to elevated creatinine. Out-patient cardiology follow-up (and underwent out-patient LHC 11/05/18 showing non-obstructive disease. There was mention of eventually getting an echo to assess EF, but it appears this was never ordered and was not mentioned in his 11/25/18 follow-up.)   Patient was last seen by cardiologist Dr. Martinique on 11/25/18 post-cath. Lisinopril resumed and b-blocker increased again. Dr. Johney Maine requested cardiology input for surgery. Martinique, Peter, MD wrote on 03/08/19, "He can hold plavix for 5-6 days for  surgery and should be cleared to proceed."  Patient had several EKGs at his 01/20/19 ED. Final ED provider interpretations are in MUSE and read as ST with irregular rate [not as afib], ST with multiple PVCs/PACs, IVCD or incomplete LBBB. I reviewed his 01/20/19 and 10/30/18 tracing with anesthesiologist Renold Don, MD. Underlying rhythm felt to be sinus. Will not order repeat tracing, but if tachycardic on arrival then [facility] want to reconsider--defer to anesthesiologist who will be evaluating him on the day of surgery.  Preoperative labs unremarkable with normal CBC and Creatinine. Preoperative COVID test negative on 03/18/19.   VS: I do not see vitals documented from his PAT visit. Last three vitals records in [facility] include:   Wt Readings from Last 3 Encounters:  01/20/19 90.7 kg  11/25/18 95 kg  11/05/18 94.8 kg   Temp Readings from Last 3 Encounters:  01/20/19 36.6 C (Oral)  11/05/18 37.2 C (Oral)  10/30/18 (!) 36.3 C (Oral)   BP Readings from Last 3 Encounters:  01/20/19 139/83  11/25/18 140/90  11/05/18 (!) 148/76   Pulse Readings from Last 3 Encounters:  01/20/19 100  11/25/18 61  11/05/18 (!) 58    PROVIDERS: Burnard Bunting, MD is PCP Martinique, Peter, MD is cardiologist   LABS: Labs reviewed: Acceptable for surgery. Lab Results  Component Value Date   WBC 8.4 03/18/2019   HGB 15.2 03/18/2019   HCT 46.3 03/18/2019   PLT 257 03/18/2019   GLUCOSE 123 (H) 03/18/2019   ALT 15 01/20/2019   AST 25 01/20/2019   NA 139 03/18/2019   K 3.9 03/18/2019   CL 107 03/18/2019   CREATININE 0.98 03/18/2019   BUN 13 03/18/2019   CO2 22 03/18/2019  TSH 2.943 10/29/2018   HGBA1C 5.0 10/29/2018    IMAGES: CT abd/pelvis 01/20/19: IMPRESSION: 1. Gallstone. Mild gallbladder distention but no pericholecystic inflammation on CT. If there is clinical concern for acute cholecystitis, recommend right upper quadrant ultrasound. 2. Equivocal bladder wall thickening about the dome. 3.  No bowel inflammation or explanation for diarrhea. 4. Left inguinal hernia contains fat. Aortic Atherosclerosis (ICD10-I70.0).  1V CXR 10/29/18: FINDINGS: Cardiac shadows within normal limits. Thoracic aorta is tortuous. The lungs are well aerated bilaterally without focal infiltrate or sizable effusion. No acute bony abnormality is noted. IMPRESSION: No active disease.   EKG: 01/20/19 22:48:12: Reviewed with Dr. Fransisco Beau. Likely ST with frequent PACs and occasional PVCs. Nonspecific IVCD. Anteroseptal infarct (old). Non-specific T wave abnormalities, lateral leads.    CV: Cardiac cath 11/05/18: Left Main  Vessel was injected. Vessel is normal in caliber. Vessel is angiographically normal.  Left Anterior Descending  Prox LAD lesion 0% stenosed  Previously placed Prox LAD stent (unknown type) is widely patent.  Mid LAD lesion 45% stenosed  Mid LAD lesion is 45% stenosed. The lesion is focal.  Left Circumflex  Vessel was injected. Vessel is large. The vessel exhibits minimal luminal irregularities.  Right Coronary Artery  Vessel was injected. Vessel is small. Vessel is angiographically normal.   CONCLUSION:  Previously placed Prox LAD stent (unknown type) is widely patent.  Mid LAD lesion is 45% stenosed.  LV end diastolic pressure is normal. 1. Nonobstructive CAD 2. Normal LVEDP Plan: continue medical therapy. Will obtain an Echo as outpatient to assess LV function. Consider alternative causes of chest pain. (I did not see a subsequent order for echo, and an echo is not mentioned in 11/25/18 follow-up with Dr. Martinique.)  21 Day Event Monitor 10/16/11: No SVT seen. Occ. PACS and PVCS   Past Medical History:  Diagnosis Date  . Acromioclavicular joint arthritis   . Anemia   . B12 deficiency 10/15/11   "take injections q 28 days"  . Coronary artery disease    LHC 10/15/11: LAD stent patent with mid ectasia, distal LAD 50-60%, mid circumflex with ectasia, EF 55-65%.  .  Dyslipidemia   . GERD (gastroesophageal reflux disease)   . Glucose intolerance (pre-diabetes)    Borderline glucose intolerance  . Hypertension   . Hypothyroidism   . Post concussive syndrome 2011   "for ~ 6-8 months"  . Rotator cuff tear   . Shortness of breath 10/15/11   "here lately I've been having it on & off any time"  . SVT (supraventricular tachycardia) ([place])    maintained on beta blocker    Past Surgical History:  Procedure Laterality Date  . BACK SURGERY    . CARDIAC CATHETERIZATION  12/19/08   NORMAL. EF 55%  . CARDIAC CATHETERIZATION  10/15/11  . CARDIOVASCULAR STRESS TEST  10/2010  . CARPAL TUNNEL RELEASE     bilaterally  . COLONOSCOPY W/ ENDOSCOPIC [place]    . CORONARY ANGIOPLASTY WITH STENT PLACEMENT  9/ 2005   . JOINT REPLACEMENT    . LEFT HEART CATH AND CORONARY ANGIOGRAPHY N/A 11/05/2018   Procedure: LEFT HEART CATH AND CORONARY ANGIOGRAPHY;  Surgeon: Martinique, Peter M, MD;  Location: [facility] CV LAB;  Service: Cardiovascular;  Laterality: N/A;  . LEFT HEART CATHETERIZATION WITH CORONARY ANGIOGRAM N/A 10/15/2011   Procedure: LEFT HEART CATHETERIZATION WITH CORONARY ANGIOGRAM;  Surgeon: Sherren Mocha, MD;  Location: [facility] CATH LAB;  Service: Cardiovascular;  Laterality: N/A;  . LUMBAR LAMINECTOMY  1970's  .  NASAL SINUS SURGERY     x2  . reconstructive shoulder surgery     right  . REPLACEMENT TOTAL KNEE  12/2007   left  . RESECTION TUMOR CLAVICLE RADICAL     Open distal clavicle resection  . SHOULDER SURGERY  02/11/2011   right  . TRIGGER FINGER RELEASE     ? both hands    MEDICATIONS: . [START ON 03/23/2019] bupivacaine liposome (EXPAREL) 1.3 % injection 266 mg  . [START ON 03/23/2019] cefTRIAXone (ROCEPHIN) 2 g in sodium chloride 0.9 % 100 mL IVPB   And  . [START ON 03/23/2019] metroNIDAZOLE (FLAGYL) IVPB 500 mg   . acetaminophen (TYLENOL) 650 MG CR tablet  . ANORO ELLIPTA 62.5-25 MCG/INH AEPB  . aspirin 81 MG tablet  . atorvastatin (LIPITOR) 10 MG tablet   . Cholecalciferol (VITAMIN D3) 50 MCG (2000 UT) capsule  . clopidogrel (PLAVIX) 75 MG tablet  . Cyanocobalamin (VITAMIN B-12 IJ)  . diclofenac sodium (VOLTAREN) 1 % GEL  . DULoxetine (CYMBALTA) 30 MG capsule  . Glucos-Chond-Hyal Ac-Ca Fructo (MOVE FREE JOINT HEALTH ADVANCE PO)  . hydrOXYzine (ATARAX) 25 MG tablet  . levothyroxine (SYNTHROID, LEVOTHROID) 25 MCG tablet  . lisinopril (PRINIVIL,ZESTRIL) 20 MG tablet  . loperamide (IMODIUM A-D) 2 MG tablet  . Magnesium 400 MG CAPS  . meclizine (ANTIVERT) 25 MG tablet  . metoprolol tartrate (LOPRESSOR) 50 MG tablet  . nitroGLYCERIN (NITROSTAT) 0.4 MG SL tablet  . ondansetron (ZOFRAN) 4 MG tablet  . pantoprazole (PROTONIX) 40 MG tablet  . senna (SENOKOT) 8.6 MG TABS tablet    Myra Gianotti, PA-C Surgical Short Stay/Anesthesiology [facility] Phone [phone] [facility] Phone [phone] 03/22/2019 5:00 PM

## 2019-03-22 NOTE — Anesthesia Preprocedure Evaluation (Addendum)
Anesthesia Evaluation  Patient identified by MRN, date of birth, ID band Patient awake    Reviewed: Allergy & Precautions, NPO status , Patient's Chart, lab work & pertinent test results  Airway Mallampati: II  TM Distance: >3 FB Neck ROM: Full    Dental no notable dental hx.    Pulmonary neg pulmonary ROS, former smoker,    Pulmonary exam normal breath sounds clear to auscultation       Cardiovascular hypertension, + CAD and + Cardiac Stents  Normal cardiovascular exam+ dysrhythmias Supra Ventricular Tachycardia  Rhythm:Regular Rate:Normal     Neuro/Psych negative neurological ROS  negative psych ROS   GI/Hepatic Neg liver ROS, GERD  ,  Endo/Other  Hypothyroidism   Renal/GU negative Renal ROS  negative genitourinary   Musculoskeletal negative musculoskeletal ROS (+)   Abdominal   Peds negative pediatric ROS (+)  Hematology negative hematology ROS (+)   Anesthesia Other Findings   Reproductive/Obstetrics negative OB ROS                            Anesthesia Physical Anesthesia Plan  ASA: III  Anesthesia Plan: General   Post-op Pain Management:    Induction: Intravenous and Rapid sequence  PONV Risk Score and Plan: 2 and Ondansetron, Dexamethasone and Treatment [place] vary due to age or medical condition  Airway Management Planned: Oral ETT  Additional Equipment:   Intra-op Plan:   Post-operative Plan: Extubation in OR  Informed Consent: I have reviewed the patients History and Physical, chart, labs and discussed the procedure including the risks, benefits and alternatives for the proposed anesthesia with the patient or authorized representative who has indicated his/her understanding and acceptance.     Dental advisory given  Plan Discussed with: CRNA and Surgeon  Anesthesia Plan Comments: (PAT note written 03/22/2019 by Myra Gianotti, PA-C. )       Anesthesia  Quick Evaluation

## 2019-03-23 ENCOUNTER — Observation Stay (HOSPITAL_COMMUNITY)
Admission: RE | Admit: 2019-03-23 | Discharge: 2019-03-24 | Disposition: A | Discharge status: Home / Self Care | Payer: Medicare Other | Source: Home / Self Care | Attending: Surgery | Admitting: Surgery

## 2019-03-23 ENCOUNTER — Other Ambulatory Visit: Payer: Self-pay

## 2019-03-23 ENCOUNTER — Ambulatory Visit (HOSPITAL_COMMUNITY): Payer: Medicare Other

## 2019-03-23 ENCOUNTER — Encounter (HOSPITAL_COMMUNITY)
Admission: RE | Disposition: A | Discharge status: Home / Self Care | Payer: Self-pay | Source: Home / Self Care | Attending: Surgery

## 2019-03-23 ENCOUNTER — Encounter (HOSPITAL_COMMUNITY): Payer: Self-pay | Admitting: Anesthesiology

## 2019-03-23 ENCOUNTER — Ambulatory Visit (HOSPITAL_COMMUNITY): Payer: Medicare Other | Admitting: Vascular Surgery

## 2019-03-23 DIAGNOSIS — K811 Chronic cholecystitis: Secondary | ICD-10-CM | POA: Diagnosis present

## 2019-03-23 DIAGNOSIS — I2510 Atherosclerotic heart disease of native coronary artery without angina pectoris: Secondary | ICD-10-CM | POA: Insufficient documentation

## 2019-03-23 DIAGNOSIS — E785 Hyperlipidemia, unspecified: Secondary | ICD-10-CM | POA: Insufficient documentation

## 2019-03-23 DIAGNOSIS — K812 Acute cholecystitis with chronic cholecystitis: Secondary | ICD-10-CM

## 2019-03-23 DIAGNOSIS — I471 Supraventricular tachycardia: Secondary | ICD-10-CM | POA: Insufficient documentation

## 2019-03-23 DIAGNOSIS — Z96652 Presence of left artificial knee joint: Secondary | ICD-10-CM | POA: Insufficient documentation

## 2019-03-23 DIAGNOSIS — E039 Hypothyroidism, unspecified: Secondary | ICD-10-CM | POA: Insufficient documentation

## 2019-03-23 DIAGNOSIS — G9341 Metabolic encephalopathy: Secondary | ICD-10-CM | POA: Diagnosis not present

## 2019-03-23 DIAGNOSIS — Z7902 Long term (current) use of antithrombotics/antiplatelets: Secondary | ICD-10-CM | POA: Insufficient documentation

## 2019-03-23 DIAGNOSIS — Z955 Presence of coronary angioplasty implant and graft: Secondary | ICD-10-CM | POA: Insufficient documentation

## 2019-03-23 DIAGNOSIS — R7989 Other specified abnormal findings of blood chemistry: Secondary | ICD-10-CM | POA: Diagnosis not present

## 2019-03-23 DIAGNOSIS — K8010 Calculus of gallbladder with chronic cholecystitis without obstruction: Secondary | ICD-10-CM | POA: Diagnosis not present

## 2019-03-23 DIAGNOSIS — R413 Other amnesia: Secondary | ICD-10-CM | POA: Diagnosis not present

## 2019-03-23 DIAGNOSIS — R2681 Unsteadiness on feet: Secondary | ICD-10-CM | POA: Insufficient documentation

## 2019-03-23 DIAGNOSIS — Z79899 Other long term (current) drug therapy: Secondary | ICD-10-CM | POA: Insufficient documentation

## 2019-03-23 DIAGNOSIS — R1011 Right upper quadrant pain: Secondary | ICD-10-CM | POA: Diagnosis not present

## 2019-03-23 DIAGNOSIS — K8012 Calculus of gallbladder with acute and chronic cholecystitis without obstruction: Secondary | ICD-10-CM | POA: Insufficient documentation

## 2019-03-23 DIAGNOSIS — K219 Gastro-esophageal reflux disease without esophagitis: Secondary | ICD-10-CM | POA: Diagnosis present

## 2019-03-23 DIAGNOSIS — K8020 Calculus of gallbladder without cholecystitis without obstruction: Secondary | ICD-10-CM | POA: Diagnosis not present

## 2019-03-23 DIAGNOSIS — R410 Disorientation, unspecified: Secondary | ICD-10-CM | POA: Diagnosis not present

## 2019-03-23 DIAGNOSIS — R4182 Altered mental status, unspecified: Secondary | ICD-10-CM | POA: Diagnosis not present

## 2019-03-23 DIAGNOSIS — Z20828 Contact with and (suspected) exposure to other viral communicable diseases: Secondary | ICD-10-CM | POA: Diagnosis not present

## 2019-03-23 DIAGNOSIS — Z79890 Hormone replacement therapy (postmenopausal): Secondary | ICD-10-CM | POA: Insufficient documentation

## 2019-03-23 DIAGNOSIS — K8063 Calculus of gallbladder and bile duct with acute cholecystitis with obstruction: Secondary | ICD-10-CM

## 2019-03-23 DIAGNOSIS — K8050 Calculus of bile duct without cholangitis or cholecystitis without obstruction: Secondary | ICD-10-CM | POA: Diagnosis not present

## 2019-03-23 DIAGNOSIS — Z7982 Long term (current) use of aspirin: Secondary | ICD-10-CM | POA: Insufficient documentation

## 2019-03-23 DIAGNOSIS — J9811 Atelectasis: Secondary | ICD-10-CM | POA: Diagnosis not present

## 2019-03-23 DIAGNOSIS — E872 Acidosis: Secondary | ICD-10-CM | POA: Diagnosis not present

## 2019-03-23 DIAGNOSIS — Z87891 Personal history of nicotine dependence: Secondary | ICD-10-CM | POA: Insufficient documentation

## 2019-03-23 DIAGNOSIS — R6510 Systemic inflammatory response syndrome (SIRS) of non-infectious origin without acute organ dysfunction: Secondary | ICD-10-CM | POA: Diagnosis not present

## 2019-03-23 DIAGNOSIS — M1990 Unspecified osteoarthritis, unspecified site: Secondary | ICD-10-CM | POA: Insufficient documentation

## 2019-03-23 DIAGNOSIS — Z6825 Body mass index (BMI) 25.0-25.9, adult: Secondary | ICD-10-CM | POA: Diagnosis not present

## 2019-03-23 DIAGNOSIS — I10 Essential (primary) hypertension: Secondary | ICD-10-CM | POA: Diagnosis not present

## 2019-03-23 DIAGNOSIS — N179 Acute kidney failure, unspecified: Secondary | ICD-10-CM | POA: Diagnosis not present

## 2019-03-23 DIAGNOSIS — Z7901 Long term (current) use of anticoagulants: Secondary | ICD-10-CM

## 2019-03-23 DIAGNOSIS — R0902 Hypoxemia: Secondary | ICD-10-CM | POA: Diagnosis not present

## 2019-03-23 DIAGNOSIS — I25110 Atherosclerotic heart disease of native coronary artery with unstable angina pectoris: Secondary | ICD-10-CM | POA: Diagnosis not present

## 2019-03-23 HISTORY — PX: CHOLECYSTECTOMY: SHX55

## 2019-03-23 HISTORY — PX: LAPAROSCOPIC CHOLECYSTECTOMY SINGLE SITE WITH INTRAOPERATIVE CHOLANGIOGRAM: SHX6538

## 2019-03-23 SURGERY — LAPAROSCOPIC CHOLECYSTECTOMY SINGLE SITE WITH INTRAOPERATIVE CHOLANGIOGRAM
Anesthesia: General | Site: Abdomen

## 2019-03-23 MED ORDER — LOPERAMIDE HCL 2 MG PO CAPS: 2.0000 mg | ORAL_CAPSULE | ORAL | Status: DC | PRN

## 2019-03-23 MED ORDER — FENTANYL CITRATE (PF) 100 MCG/2ML IJ SOLN
25.0000 ug | INTRAMUSCULAR | Status: DC | PRN
  Administered 2019-03-23 (×2): 25 ug via INTRAVENOUS

## 2019-03-23 MED ORDER — SODIUM CHLORIDE 0.9% FLUSH: 3.0000 mL | Freq: Two times a day (BID) | INTRAVENOUS | Status: DC

## 2019-03-23 MED ORDER — ENOXAPARIN SODIUM 40 MG/0.4ML ~~LOC~~ SOLN
40.0000 mg | SUBCUTANEOUS | Status: DC
  Administered 2019-03-24: 40 mg via SUBCUTANEOUS
  Filled 2019-03-23: qty 0.4

## 2019-03-23 MED ORDER — LIDOCAINE 2% (20 MG/ML) 5 ML SYRINGE
INTRAMUSCULAR | Status: AC
  Filled 2019-03-23: qty 5

## 2019-03-23 MED ORDER — METOPROLOL TARTRATE 50 MG PO TABS
75.0000 mg | ORAL_TABLET | Freq: Two times a day (BID) | ORAL | Status: DC
  Administered 2019-03-23 – 2019-03-24 (×2): 75 mg via ORAL
  Filled 2019-03-23 (×2): qty 1

## 2019-03-23 MED ORDER — MAGIC MOUTHWASH
15.0000 mL | Freq: Four times a day (QID) | ORAL | Status: DC | PRN
  Filled 2019-03-23: qty 15

## 2019-03-23 MED ORDER — 0.9 % SODIUM CHLORIDE (POUR BTL) OPTIME
TOPICAL | Status: DC | PRN
  Administered 2019-03-23: 1000 mL

## 2019-03-23 MED ORDER — ACETAMINOPHEN 500 MG PO TABS
1000.0000 mg | ORAL_TABLET | Freq: Three times a day (TID) | ORAL | Status: DC
  Administered 2019-03-23 – 2019-03-24 (×3): 1000 mg via ORAL
  Filled 2019-03-23 (×3): qty 2

## 2019-03-23 MED ORDER — PROPOFOL 10 MG/ML IV BOLUS
INTRAVENOUS | Status: DC | PRN
  Administered 2019-03-23: 100 mg via INTRAVENOUS

## 2019-03-23 MED ORDER — FENTANYL CITRATE (PF) 100 MCG/2ML IJ SOLN
INTRAMUSCULAR | Status: AC
  Administered 2019-03-23: 25 ug via INTRAVENOUS
  Filled 2019-03-23: qty 2

## 2019-03-23 MED ORDER — DULOXETINE HCL 30 MG PO CPEP: 30.0000 mg | ORAL_CAPSULE | Freq: Every evening | ORAL | Status: DC

## 2019-03-23 MED ORDER — PROCHLORPERAZINE MALEATE 10 MG PO TABS
10.0000 mg | ORAL_TABLET | Freq: Four times a day (QID) | ORAL | Status: DC | PRN
  Filled 2019-03-23: qty 1

## 2019-03-23 MED ORDER — DIPHENHYDRAMINE HCL 50 MG/ML IJ SOLN: 12.5000 mg | Freq: Four times a day (QID) | INTRAMUSCULAR | Status: DC | PRN

## 2019-03-23 MED ORDER — ROCURONIUM BROMIDE 10 MG/ML (PF) SYRINGE
PREFILLED_SYRINGE | INTRAVENOUS | Status: AC
  Filled 2019-03-23: qty 10

## 2019-03-23 MED ORDER — KETOROLAC TROMETHAMINE 30 MG/ML IJ SOLN: 15.0000 mg | Freq: Once | INTRAMUSCULAR | Status: DC | PRN

## 2019-03-23 MED ORDER — DICLOFENAC SODIUM 1 % TD GEL
2.0000 g | Freq: Four times a day (QID) | TRANSDERMAL | Status: DC | PRN
  Filled 2019-03-23: qty 100

## 2019-03-23 MED ORDER — BUPIVACAINE-EPINEPHRINE (PF) 0.25% -1:200000 IJ SOLN
INTRAMUSCULAR | Status: AC
  Filled 2019-03-23: qty 30

## 2019-03-23 MED ORDER — DIPHENHYDRAMINE HCL 12.5 MG/5ML PO ELIX: 12.5000 mg | ORAL_SOLUTION | Freq: Four times a day (QID) | ORAL | Status: DC | PRN

## 2019-03-23 MED ORDER — DEXAMETHASONE SODIUM PHOSPHATE 4 MG/ML IJ SOLN
INTRAMUSCULAR | Status: DC | PRN
  Administered 2019-03-23: 4 mg via INTRAVENOUS

## 2019-03-23 MED ORDER — ACETAMINOPHEN 500 MG PO TABS
1000.0000 mg | ORAL_TABLET | ORAL | Status: DC
  Filled 2019-03-23: qty 2

## 2019-03-23 MED ORDER — ESMOLOL HCL 100 MG/10ML IV SOLN
INTRAVENOUS | Status: DC | PRN
  Administered 2019-03-23: 30 mg via INTRAVENOUS

## 2019-03-23 MED ORDER — ONDANSETRON HCL 4 MG/2ML IJ SOLN
INTRAMUSCULAR | Status: AC
  Filled 2019-03-23: qty 2

## 2019-03-23 MED ORDER — METHOCARBAMOL 750 MG PO TABS: 750.0000 mg | ORAL_TABLET | Freq: Four times a day (QID) | ORAL | Status: DC | PRN

## 2019-03-23 MED ORDER — SIMETHICONE 80 MG PO CHEW: 40.0000 mg | CHEWABLE_TABLET | Freq: Four times a day (QID) | ORAL | Status: DC | PRN

## 2019-03-23 MED ORDER — PANTOPRAZOLE SODIUM 40 MG PO TBEC
40.0000 mg | DELAYED_RELEASE_TABLET | Freq: Every day | ORAL | Status: DC
  Administered 2019-03-24: 40 mg via ORAL
  Filled 2019-03-23: qty 1

## 2019-03-23 MED ORDER — BISACODYL 10 MG RE SUPP: 10.0000 mg | Freq: Every day | RECTAL | Status: DC | PRN

## 2019-03-23 MED ORDER — TRAMADOL HCL 50 MG PO TABS: 50.0000 mg | ORAL_TABLET | Freq: Four times a day (QID) | ORAL | Status: DC | PRN

## 2019-03-23 MED ORDER — SODIUM CHLORIDE 0.9% FLUSH: 3.0000 mL | INTRAVENOUS | Status: DC | PRN

## 2019-03-23 MED ORDER — ATORVASTATIN CALCIUM 10 MG PO TABS
10.0000 mg | ORAL_TABLET | Freq: Every day | ORAL | Status: DC
  Administered 2019-03-24: 10 mg via ORAL
  Filled 2019-03-23: qty 1

## 2019-03-23 MED ORDER — FENTANYL CITRATE (PF) 250 MCG/5ML IJ SOLN
INTRAMUSCULAR | Status: AC
  Filled 2019-03-23: qty 5

## 2019-03-23 MED ORDER — ONDANSETRON HCL 4 MG PO TABS: 4.0000 mg | ORAL_TABLET | Freq: Three times a day (TID) | ORAL | Status: DC | PRN

## 2019-03-23 MED ORDER — SODIUM CHLORIDE 0.9 % IV SOLN
INTRAVENOUS | Status: DC | PRN
  Administered 2019-03-23: 20 ug/min via INTRAVENOUS

## 2019-03-23 MED ORDER — SUCCINYLCHOLINE CHLORIDE 200 MG/10ML IV SOSY
PREFILLED_SYRINGE | INTRAVENOUS | Status: AC
  Filled 2019-03-23: qty 10

## 2019-03-23 MED ORDER — LABETALOL HCL 5 MG/ML IV SOLN: INTRAVENOUS | Status: DC | PRN

## 2019-03-23 MED ORDER — PROMETHAZINE HCL 25 MG/ML IJ SOLN: 6.2500 mg | INTRAMUSCULAR | Status: DC | PRN

## 2019-03-23 MED ORDER — DEXAMETHASONE SODIUM PHOSPHATE 10 MG/ML IJ SOLN
INTRAMUSCULAR | Status: AC
  Filled 2019-03-23: qty 1

## 2019-03-23 MED ORDER — DIPHENHYDRAMINE HCL 50 MG/ML IJ SOLN
INTRAMUSCULAR | Status: AC
  Filled 2019-03-23: qty 1

## 2019-03-23 MED ORDER — OXYCODONE HCL 5 MG PO TABS: 5.0000 mg | ORAL_TABLET | Freq: Once | ORAL | Status: DC | PRN

## 2019-03-23 MED ORDER — SODIUM CHLORIDE 0.9 % IV SOLN: 250.0000 mL | INTRAVENOUS | Status: DC | PRN

## 2019-03-23 MED ORDER — NITROGLYCERIN 0.4 MG SL SUBL: 0.4000 mg | SUBLINGUAL_TABLET | SUBLINGUAL | Status: DC | PRN

## 2019-03-23 MED ORDER — PROCHLORPERAZINE EDISYLATE 10 MG/2ML IJ SOLN: 5.0000 mg | Freq: Four times a day (QID) | INTRAMUSCULAR | Status: DC | PRN

## 2019-03-23 MED ORDER — SODIUM CHLORIDE 0.9 % IR SOLN
Status: DC | PRN
  Administered 2019-03-23: 3000 mL

## 2019-03-23 MED ORDER — ONDANSETRON HCL 4 MG/2ML IJ SOLN
INTRAMUSCULAR | Status: DC | PRN
  Administered 2019-03-23: 4 mg via INTRAVENOUS

## 2019-03-23 MED ORDER — HYDROMORPHONE HCL 1 MG/ML IJ SOLN
0.5000 mg | INTRAMUSCULAR | Status: DC | PRN
  Administered 2019-03-24: 1 mg via INTRAVENOUS
  Filled 2019-03-23: qty 1

## 2019-03-23 MED ORDER — LACTATED RINGERS IV SOLN
INTRAVENOUS | Status: DC
  Administered 2019-03-23 (×2): via INTRAVENOUS

## 2019-03-23 MED ORDER — SENNA 8.6 MG PO TABS: 1.0000 | ORAL_TABLET | Freq: Every day | ORAL | Status: DC | PRN

## 2019-03-23 MED ORDER — POLYETHYLENE GLYCOL 3350 17 G PO PACK: 17.0000 g | PACK | Freq: Every day | ORAL | Status: DC | PRN

## 2019-03-23 MED ORDER — VITAMIN D 25 MCG (1000 UNIT) PO TABS: 2000.0000 [IU] | ORAL_TABLET | Freq: Every evening | ORAL | Status: DC

## 2019-03-23 MED ORDER — HYDROXYZINE HCL 25 MG PO TABS
25.0000 mg | ORAL_TABLET | Freq: Every day | ORAL | Status: DC
  Administered 2019-03-23: 25 mg via ORAL
  Filled 2019-03-23: qty 1

## 2019-03-23 MED ORDER — TRAMADOL HCL 50 MG PO TABS: 50.0000 mg | ORAL_TABLET | Freq: Four times a day (QID) | ORAL | 0 refills | Status: DC | PRN

## 2019-03-23 MED ORDER — ONDANSETRON 4 MG PO TBDP: 4.0000 mg | ORAL_TABLET | Freq: Four times a day (QID) | ORAL | Status: DC | PRN

## 2019-03-23 MED ORDER — MAGNESIUM OXIDE 400 (241.3 MG) MG PO TABS: 400.0000 mg | ORAL_TABLET | Freq: Every evening | ORAL | Status: DC

## 2019-03-23 MED ORDER — LABETALOL HCL 5 MG/ML IV SOLN
INTRAVENOUS | Status: DC | PRN
  Administered 2019-03-23: 5 mg via INTRAVENOUS

## 2019-03-23 MED ORDER — LIDOCAINE HCL (CARDIAC) PF 100 MG/5ML IV SOSY
PREFILLED_SYRINGE | INTRAVENOUS | Status: DC | PRN
  Administered 2019-03-23: 80 mg via INTRAVENOUS

## 2019-03-23 MED ORDER — METOPROLOL TARTRATE 5 MG/5ML IV SOLN: 5.0000 mg | Freq: Four times a day (QID) | INTRAVENOUS | Status: DC | PRN

## 2019-03-23 MED ORDER — LABETALOL HCL 5 MG/ML IV SOLN
INTRAVENOUS | Status: AC
  Filled 2019-03-23: qty 4

## 2019-03-23 MED ORDER — HYDRALAZINE HCL 20 MG/ML IJ SOLN: 5.0000 mg | INTRAMUSCULAR | Status: DC | PRN

## 2019-03-23 MED ORDER — LACTATED RINGERS IV BOLUS: 1000.0000 mL | Freq: Three times a day (TID) | INTRAVENOUS | Status: DC | PRN

## 2019-03-23 MED ORDER — MECLIZINE HCL 25 MG PO TABS
25.0000 mg | ORAL_TABLET | Freq: Two times a day (BID) | ORAL | Status: DC | PRN
  Filled 2019-03-23: qty 1

## 2019-03-23 MED ORDER — SODIUM CHLORIDE 0.9 % IV SOLN
INTRAVENOUS | Status: DC | PRN
  Administered 2019-03-23: 13 mL

## 2019-03-23 MED ORDER — PROPOFOL 10 MG/ML IV BOLUS
INTRAVENOUS | Status: AC
  Filled 2019-03-23: qty 20

## 2019-03-23 MED ORDER — ASPIRIN 81 MG PO CHEW
81.0000 mg | CHEWABLE_TABLET | Freq: Every day | ORAL | Status: DC
  Administered 2019-03-24: 81 mg via ORAL
  Filled 2019-03-23: qty 1

## 2019-03-23 MED ORDER — OXYCODONE HCL 5 MG/5ML PO SOLN: 5.0000 mg | Freq: Once | ORAL | Status: DC | PRN

## 2019-03-23 MED ORDER — ONDANSETRON HCL 4 MG/2ML IJ SOLN: 4.0000 mg | Freq: Four times a day (QID) | INTRAMUSCULAR | Status: DC | PRN

## 2019-03-23 MED ORDER — CHLORHEXIDINE GLUCONATE CLOTH 2 % EX PADS: 6.0000 | MEDICATED_PAD | Freq: Once | CUTANEOUS | Status: DC

## 2019-03-23 MED ORDER — ROCURONIUM BROMIDE 100 MG/10ML IV SOLN
INTRAVENOUS | Status: DC | PRN
  Administered 2019-03-23: 20 mg via INTRAVENOUS
  Administered 2019-03-23: 50 mg via INTRAVENOUS
  Administered 2019-03-23 (×2): 10 mg via INTRAVENOUS

## 2019-03-23 MED ORDER — GABAPENTIN 300 MG PO CAPS
300.0000 mg | ORAL_CAPSULE | ORAL | Status: AC
  Administered 2019-03-23: 300 mg via ORAL
  Filled 2019-03-23: qty 1

## 2019-03-23 MED ORDER — UMECLIDINIUM-VILANTEROL 62.5-25 MCG/INH IN AEPB
1.0000 | INHALATION_SPRAY | Freq: Every day | RESPIRATORY_TRACT | Status: DC | PRN
  Filled 2019-03-23: qty 14

## 2019-03-23 MED ORDER — LISINOPRIL 20 MG PO TABS
20.0000 mg | ORAL_TABLET | Freq: Every day | ORAL | Status: DC
  Administered 2019-03-24: 20 mg via ORAL
  Filled 2019-03-23: qty 1

## 2019-03-23 MED ORDER — FENTANYL CITRATE (PF) 100 MCG/2ML IJ SOLN
INTRAMUSCULAR | Status: DC | PRN
  Administered 2019-03-23 (×3): 50 ug via INTRAVENOUS
  Administered 2019-03-23: 100 ug via INTRAVENOUS

## 2019-03-23 MED ORDER — GABAPENTIN 300 MG PO CAPS
300.0000 mg | ORAL_CAPSULE | Freq: Two times a day (BID) | ORAL | Status: DC
  Administered 2019-03-23 – 2019-03-24 (×2): 300 mg via ORAL
  Filled 2019-03-23 (×2): qty 1

## 2019-03-23 MED ORDER — SUGAMMADEX SODIUM 200 MG/2ML IV SOLN
INTRAVENOUS | Status: DC | PRN
  Administered 2019-03-23: 200 mg via INTRAVENOUS

## 2019-03-23 MED ORDER — LIP MEDEX EX OINT
1.0000 "application " | TOPICAL_OINTMENT | Freq: Two times a day (BID) | CUTANEOUS | Status: DC
  Administered 2019-03-23 – 2019-03-24 (×2): 1 via TOPICAL
  Filled 2019-03-23: qty 7

## 2019-03-23 MED ORDER — LEVOTHYROXINE SODIUM 25 MCG PO TABS
25.0000 ug | ORAL_TABLET | Freq: Every day | ORAL | Status: DC
  Administered 2019-03-24: 25 ug via ORAL
  Filled 2019-03-23: qty 1

## 2019-03-23 MED ORDER — BUPIVACAINE-EPINEPHRINE 0.25% -1:200000 IJ SOLN
INTRAMUSCULAR | Status: DC | PRN
  Administered 2019-03-23: 30 mL

## 2019-03-23 SURGICAL SUPPLY — 51 items
APPLIER CLIP 5 13 M/L LIGAMAX5 (MISCELLANEOUS) ×3
BLADE CLIPPER SURG (BLADE) ×3 IMPLANT
CANISTER SUCT 3000ML PPV (MISCELLANEOUS) ×3 IMPLANT
CHLORAPREP W/TINT 26ML (MISCELLANEOUS) ×3 IMPLANT
CLIP APPLIE 5 13 M/L LIGAMAX5 (MISCELLANEOUS) ×1 IMPLANT
COVER MAYO STAND STRL (DRAPES) ×3 IMPLANT
COVER SURGICAL LIGHT HANDLE (MISCELLANEOUS) ×3 IMPLANT
COVER WAND RF STERILE (DRAPES) ×3 IMPLANT
DRAPE C-ARM 42X72 X-RAY (DRAPES) ×3 IMPLANT
DRAPE WARM FLUID 44X44 (DRAPE) ×3 IMPLANT
DRSG TEGADERM 4X4.75 (GAUZE/BANDAGES/DRESSINGS) ×3 IMPLANT
ELECT REM PT RETURN 9FT ADLT (ELECTROSURGICAL) ×3
ELECTRODE REM PT RTRN 9FT ADLT (ELECTROSURGICAL) ×1 IMPLANT
ENDOLOOP SUT PDS II  0 18 (SUTURE) ×2
ENDOLOOP SUT PDS II 0 18 (SUTURE) ×1 IMPLANT
GAUZE SPONGE 2X2 8PLY NS (GAUZE/BANDAGES/DRESSINGS) ×3 IMPLANT
GAUZE SPONGE 2X2 8PLY STRL LF (GAUZE/BANDAGES/DRESSINGS) ×2 IMPLANT
GLOVE BIOGEL PI IND STRL 7.0 (GLOVE) ×1 IMPLANT
GLOVE BIOGEL PI INDICATOR 7.0 (GLOVE) ×2
GLOVE ECLIPSE 8.0 STRL XLNG CF (GLOVE) ×3 IMPLANT
GLOVE INDICATOR 8.0 STRL GRN (GLOVE) ×3 IMPLANT
GLOVE SURG SS PI 6.5 STRL IVOR (GLOVE) ×3 IMPLANT
GOWN STRL REUS W/ TWL LRG LVL3 (GOWN DISPOSABLE) ×2 IMPLANT
GOWN STRL REUS W/ TWL XL LVL3 (GOWN DISPOSABLE) ×1 IMPLANT
GOWN STRL REUS W/TWL LRG LVL3 (GOWN DISPOSABLE) ×4
GOWN STRL REUS W/TWL XL LVL3 (GOWN DISPOSABLE) ×2
KIT BASIN OR (CUSTOM PROCEDURE TRAY) ×3 IMPLANT
KIT TURNOVER KIT B (KITS) ×3 IMPLANT
NEEDLE 22X1 1/2 (OR ONLY) (NEEDLE) ×3 IMPLANT
NS IRRIG 1000ML POUR BTL (IV SOLUTION) ×3 IMPLANT
PAD ARMBOARD 7.5X6 YLW CONV (MISCELLANEOUS) ×6 IMPLANT
POUCH RETRIEVAL ECOSAC 10 (ENDOMECHANICALS) ×1 IMPLANT
POUCH RETRIEVAL ECOSAC 10MM (ENDOMECHANICALS) ×2
POUCH SPECIMEN RETRIEVAL 10MM (ENDOMECHANICALS) IMPLANT
SCISSORS LAP 5X35 DISP (ENDOMECHANICALS) ×3 IMPLANT
SET CHOLANGIOGRAPH 5 50 .035 (SET/KITS/TRAYS/PACK) ×3 IMPLANT
SET IRRIG TUBING LAPAROSCOPIC (IRRIGATION / IRRIGATOR) ×3 IMPLANT
SET TUBE SMOKE EVAC HIGH FLOW (TUBING) ×3 IMPLANT
SHEARS HARMONIC ACE PLUS 36CM (ENDOMECHANICALS) ×3 IMPLANT
SPECIMEN JAR SMALL (MISCELLANEOUS) ×3 IMPLANT
SPONGE GAUZE 2X2 STER 10/PKG (GAUZE/BANDAGES/DRESSINGS) ×4
SUT MNCRL AB 4-0 PS2 18 (SUTURE) ×3 IMPLANT
SUT PDS AB 1 CT  36 (SUTURE) ×8
SUT PDS AB 1 CT 36 (SUTURE) ×4 IMPLANT
SUT PDS AB 1 CT1 36 (SUTURE) ×6 IMPLANT
SUT VICRYL 0 TIES 12 18 (SUTURE) IMPLANT
TOWEL OR 17X26 10 PK STRL BLUE (TOWEL DISPOSABLE) ×3 IMPLANT
TRAY LAPAROSCOPIC MC (CUSTOM PROCEDURE TRAY) ×3 IMPLANT
TROCAR 5M 150ML BLDLS (TROCAR) ×3 IMPLANT
TROCAR XCEL NON-BLD 5MMX100MML (ENDOMECHANICALS) ×3 IMPLANT
WATER STERILE IRR 1000ML POUR (IV SOLUTION) ×3 IMPLANT

## 2019-03-23 NOTE — Op Note (Signed)
03/23/2019  PATIENT:  Johnny [name]  77 y.o. male  Patient Care Team: Burnard Bunting, MD as PCP - General Martinique, Peter M, MD as PCP - Cardiology (Cardiology) Michael Boston, MD as Consulting Physician (General Surgery)  PRE-OPERATIVE DIAGNOSIS:    Chronic Calculus cholecystitis  POST-OPERATIVE DIAGNOSIS:   Acute on Chronic Calculus Cholecystitis   PROCEDURE:  SINGLE SITE Laparoscopic cholecystectomy with intraoperative cholangiogram  SURGEON:  [name], MD, FACS.  ASSISTANT: OR Staff   ANESTHESIA:    General with endotracheal intubation Local anesthetic as a field block  EBL:  (See Anesthesia Intraoperative Record) No intake/output data recorded.  Delay start of Pharmacological VTE agent (>24hrs) due to surgical blood loss or risk of bleeding:  no  DRAINS: None   SPECIMEN: Gallbladder    DISPOSITION OF SPECIMEN:  PATHOLOGY  COUNTS:  YES  PLAN OF CARE: Admit for overnight observation  PATIENT DISPOSITION:  PACU - hemodynamically stable.  INDICATION: Early patient with history of cardiac disease but has had apples episodes of nausea vomiting and epigastric and noncardiac chest pain.  Underwent work-up.  Story more suspicious of biliary colic with postprandial nausea and vomiting and pain.  Underwent cardiac clearance.  Felt to benefit from cholecystectomy.  The anatomy & physiology of hepatobiliary & pancreatic function was discussed.  The pathophysiology of gallbladder dysfunction was discussed.  Natural history risks without surgery was discussed.   I feel the risks of no intervention will lead to serious problems that outweigh the operative risks; therefore, I recommended cholecystectomy to remove the pathology.  I explained laparoscopic techniques with possible need for an open approach.  Probable cholangiogram to evaluate the bilary tract was explained as well.    Risks such as bleeding, infection, abscess, leak, injury to other organs, need for further  treatment, heart attack, death, and other risks were discussed.  I noted a good likelihood this will help address the problem.  Possibility that this will not correct all abdominal symptoms was explained.  Goals of post-operative recovery were discussed as well.  We will work to minimize complications.  An educational handout further explaining the pathology and treatment options was given as well.  Questions were answered.  The patient expresses understanding & wishes to proceed with surgery.  OR FINDINGS: Large gallbladder with inflamed and chronic adhesions strongly suspicious for acute on chronic cholecystitis.  Somewhat dilated cystic duct and rather scarred.  Cholangiogram showing rather typical anatomy with some mild extra intrahepatic biliary branches but no major ductal dilatation.  Some sludge in the common bile duct flushed out.  No choledocholithiasis.  No leak.  Liver: normal  DESCRIPTION:   The patient was identified & brought in the operating room. The patient was positioned supine with arms tucked. SCDs were active during the entire case. The patient underwent general anesthesia without any difficulty.  The abdomen was prepped and draped in a sterile fashion. A Surgical Timeout confirmed our plan.  I made a transverse curvilinear incision through the superior umbilical fold.  I placed a 51mm long port through the supraumbilical fascia using a Hassan cutdown technique with umbilical stalk fascial countertraction. I began carbon dioxide insufflation.  No change in end tidal CO2 measurement.   Camera inspection revealed no injury. There were no adhesions to the anterior abdominal wall.  I proceeded to continue with single site technique. I placed a #5 port in left upper aspect of the wound. I placed a 5 mm atraumatic grasper in the right inferior aspect of  the wound.  I turned attention to the right upper quadrant.  Initially the gallbladder cannot be seen been with some dissection of the  greater omentum off the liver it was exposed.  And had dense omental adhesions on it.  It was somewhat pedunculated with 50% not attached to the liver.  Enlarged and stretched out.  Eventually was able to free off ventral surface adhesions to better expose and confirm the anatomy of an enlarged inflamed gallbladder.  The gallbladder fundus was elevated cephalad. I freed adhesions to the ventral surface of the gallbladder off carefully.  I freed the peritoneal coverings between the gallbladder and the liver on the posteriolateral and anteriomedial walls. I alternated between Harmonic & blunt [place] dissection to help get a good critical view of the cystic artery and cystic duct.  did further dissection to free allof the gallbladder off the liver bed to get a good critical view of the infundibulum and cystic duct. I dissected out the cystic artery; and, after getting a good 360 view, ligated the anterior & posterior branches of the cystic artery close on the infundibulum using the Harmonic ultrasonic dissection.  I took care to come from posterior lateral and anterior medial approaches to help skeletonized the lymphatics and anterior and posterior cystic artery branches.  I skeletonized the cystic duct.  I placed a clip on the infundibulum. I did a partial cystic duct-otomy and ensured patency. I placed a 5 [place] cholangiocatheter through a puncture site at the right subcostal ridge of the abdominal wall and directed it into the cystic duct.  Initially would not pass.  I repeated a ductotomy slightly more proximally.  Got the catheter pass and more easily.  We ran a cholangiogram with dilute radio-opaque contrast and continuous fluoroscopy. Contrast flowed from a side branch consistent with cystic duct cannulization. Contrast flowed up the common hepatic duct into the right and left intrahepatic chains out to secondary radicals.  Little more proximal takeoff but no major abnormalities. Contrast flowed down the  common bile duct easily across the normal ampulla into the duodenum.  This was consistent with a normal cholangiogram.  I removed the cholangiocatheter.  Just the cystic was somewhat enlarged and fragile, I ligated it with a 0 PDS Endoloop going around the entire gallbladder and last sewing the proximal cystic duct.  I placed clips on the cystic duct x4 just distal to that towards the cystic ductotomy.  I completed cystic duct transection. I freed the gallbladder from its remaining attachments to the liver. I ensured hemostasis on the gallbladder fossa of the liver and elsewhere.  I placed the gallbladder inside a eco-sac.    I inspected the rest of the abdomen & detected no injury nor bleeding elsewhere.  I removed the gallbladder out the supraumbilical fascia.  Did have to open up the wound to 2 cm to get it out since it was thickened with some moderate size stones within it.  He had a 5 mm umbilical hernia through the stalk.  I freed the umbilical stalk off of it to better expose that.  I closed the umbilical hernia along with the supraumbilical fasciaa transversely using #1 PDS interrupted stitches. I closed the skin using 4-0 monocryl stitch.  Sterile dressing was applied. The patient was extubated & arrived in the PACU in stable condition..  I had discussed postoperative care with the patient in the holding area. I discussed operative findings, updated the patient's status, discussed probable steps to recovery, and gave postoperative  recommendations to the patient's daughter, Amy Petersen..  Recommendations were made.  Questions were answered.  She expressed understanding & appreciation.  [name], M.D., F.A.C.S. Gastrointestinal and Minimally Invasive Surgery [facility] [address] [place], [place] [address] [phone] Main / Paging  03/23/2019 6:14 PM

## 2019-03-23 NOTE — Interval H&P Note (Signed)
History and Physical Interval Note:  03/23/2019 3:58 PM  Johnny [name]  has presented today for surgery, with the diagnosis of SYMPTOMATIC BILIARY COLIC, PROBABLE CHRONIC CHOLECYSTITIS.  The various methods of treatment have been discussed with the patient and family. After consideration of risks, benefits and other options for treatment, the patient has consented to  Procedure(s): [place] CHOLANGIOGRAM (N/A) as a surgical intervention.  The patient's history has been reviewed, patient examined, no change in status, stable for surgery.  I have reviewed the patient's chart and labs.  Questions were answered to the patient's satisfaction.    I have re-reviewed the the patient's records, history, medications, and allergies.  I have re-examined the patient.  I again discussed intraoperative plans and goals of post-operative recovery.  The patient agrees to proceed.  Johnny [name]  12/01/1940 [id number]  Patient Care Team: Johnny Bunting, MD as PCP - General Martinique, Peter M, MD as PCP - Cardiology (Cardiology)  Patient Active Problem List   Diagnosis Date Noted  . Unstable angina ([place]) 11/05/2018  . Tachycardia 10/29/2018  . Pain in left hip 08/13/2018  . Hypothyroidism 11/04/2011  . SVT (supraventricular tachycardia) ([place]) 04/10/2011  . HTN (hypertension) 04/10/2011  . Dyslipidemia 04/10/2011  . Coronary atherosclerosis 11/25/2010  . GERD 11/25/2010  . CONSTIPATION 11/25/2010  . CHEST PAIN UNSPECIFIED 11/25/2010  . PERSONAL HISTORY OF COLONIC POLYPS 11/25/2010    Past Medical History:  Diagnosis Date  . Acromioclavicular joint arthritis   . Anemia   . B12 deficiency 10/15/11   "take injections q 28 days"  . Coronary artery disease    LHC 10/15/11: LAD stent patent with mid ectasia, distal LAD 50-60%, mid circumflex with ectasia, EF 55-65%.  . Dyslipidemia   . GERD (gastroesophageal reflux disease)   . Glucose intolerance  (pre-diabetes)    Borderline glucose intolerance  . Hypertension   . Hypothyroidism   . Post concussive syndrome 2011   "for ~ 6-8 months"  . Rotator cuff tear   . Shortness of breath 10/15/11   "here lately I've been having it on & off any time"  . SVT (supraventricular tachycardia) ([place])    maintained on beta blocker    Past Surgical History:  Procedure Laterality Date  . BACK SURGERY    . CARDIAC CATHETERIZATION  12/19/08   NORMAL. EF 55%  . CARDIAC CATHETERIZATION  10/15/11  . CARDIOVASCULAR STRESS TEST  10/2010  . CARPAL TUNNEL RELEASE     bilaterally  . COLONOSCOPY W/ ENDOSCOPIC [place]    . CORONARY ANGIOPLASTY WITH STENT PLACEMENT  9/ 2005   . JOINT REPLACEMENT    . LEFT HEART CATH AND CORONARY ANGIOGRAPHY N/A 11/05/2018   Procedure: LEFT HEART CATH AND CORONARY ANGIOGRAPHY;  Surgeon: Martinique, Peter M, MD;  Location: [facility];  Service: Cardiovascular;  Laterality: N/A;  . LEFT HEART CATHETERIZATION WITH CORONARY ANGIOGRAM N/A 10/15/2011   Procedure: LEFT HEART CATHETERIZATION WITH CORONARY ANGIOGRAM;  Surgeon: Johnny Mocha, MD;  Location: [facility];  Service: Cardiovascular;  Laterality: N/A;  . LUMBAR LAMINECTOMY  1970's  . NASAL SINUS SURGERY     x2  . reconstructive shoulder surgery     right  . REPLACEMENT TOTAL KNEE  12/2007   left  . RESECTION TUMOR CLAVICLE RADICAL     Open distal clavicle resection  . SHOULDER SURGERY  02/11/2011   right  . TRIGGER FINGER RELEASE     ? both hands  Social History   Socioeconomic History  . Marital status: Married    Spouse name: Not on file  . Number of children: 2  . Years of education: Not on file  . Highest education level: Not on file  Occupational History    Employer: RETIRED  Social Needs  . Financial resource strain: Not on file  . Food insecurity:    Worry: Not on file    Inability: Not on file  . Transportation needs:    Medical: Not on file    Non-medical: Not on file  Tobacco Use  .  Smoking status: Former Smoker    Packs/day: 1.00    Years: 21.00    Pack years: 21.00    Types: Cigarettes    Last attempt to quit: 04/07/1976    Years since quitting: 42.9  . Smokeless tobacco: Never Used  Substance and Sexual Activity  . Alcohol use: Not Currently    Comment: 10/15/11 "haven't drank in 15-20 years"  . Drug use: No  . Sexual activity: Not Currently  Lifestyle  . Physical activity:    Days per week: Not on file    Minutes per session: Not on file  . Stress: Not on file  Relationships  . Social connections:    Talks on phone: Not on file    Gets together: Not on file    Attends religious service: Not on file    Active member of club or organization: Not on file    Attends meetings of clubs or organizations: Not on file    Relationship status: Not on file  . Intimate partner violence:    Fear of current or ex partner: Not on file    Emotionally abused: Not on file    Physically abused: Not on file    Forced sexual activity: Not on file  Other Topics Concern  . Not on file  Social History Narrative  . Not on file    Family History  Problem Relation Age of Onset  . Stroke Mother   . Heart attack Father   . Diabetes Father   . Kidney failure Father     Medications Prior to Admission  Medication Sig Dispense Refill Last Dose  . acetaminophen (TYLENOL) 650 MG CR tablet Take 1,300 mg by mouth 2 (two) times daily.    03/23/2019 at 1100  . ANORO ELLIPTA 62.5-25 MCG/INH AEPB Take 1 puff by mouth daily as needed (shortness of breath).    03/22/2019 at Unknown time  . aspirin 81 MG tablet Take 81 mg by mouth daily.     03/23/2019 at 1100  . atorvastatin (LIPITOR) 10 MG tablet Take 1 tablet (10 mg total) by mouth daily. 90 tablet 3 03/23/2019 at 1100  . Cholecalciferol (VITAMIN D3) 50 MCG (2000 UT) capsule Take 2,000 Units by mouth every evening.   03/22/2019 at Unknown time  . clopidogrel (PLAVIX) 75 MG tablet TAKE 1 TABLET BY MOUTH EVERY DAY (Patient taking  differently: Take 75 mg by mouth daily. ) 90 tablet 1 03/18/2019 at Unknown time  . Cyanocobalamin (VITAMIN B-12 IJ) Inject 1 Dose as directed every 30 (thirty) days.    Past Week at Unknown time  . DULoxetine (CYMBALTA) 30 MG capsule Take 30 mg by mouth every evening.   03/22/2019 at Unknown time  . Glucos-Chond-Hyal Ac-Ca Fructo (MOVE FREE JOINT HEALTH ADVANCE PO) Take 1 capsule by mouth 2 (two) times daily.   03/23/2019 at 1100  . hydrOXYzine (ATARAX) 25 MG tablet  Take 25 mg by mouth at bedtime.    03/22/2019 at Unknown time  . levothyroxine (SYNTHROID, LEVOTHROID) 25 MCG tablet Take 25 mcg by mouth daily.     03/23/2019 at 1100  . lisinopril (PRINIVIL,ZESTRIL) 20 MG tablet Take 1 tablet (20 mg total) by mouth daily. 90 tablet 3 03/23/2019 at 1100  . Magnesium 400 MG CAPS Take 400 mg by mouth every evening.   03/22/2019 at Unknown time  . metoprolol tartrate (LOPRESSOR) 50 MG tablet Take 1.5 tablets (75 mg total) by mouth 2 (two) times daily. 270 tablet 3 03/23/2019 at 1100  . pantoprazole (PROTONIX) 40 MG tablet Take 1 tablet (40 mg total) by mouth daily. NEED OV. 180 tablet 1 03/23/2019 at 1100  . senna (SENOKOT) 8.6 MG TABS tablet Take 1 tablet by mouth daily as needed for mild constipation.    Past Week at Unknown time  . diclofenac sodium (VOLTAREN) 1 % GEL Apply 2 g topically 4 (four) times daily. (Patient taking differently: Apply 2 g topically 4 (four) times daily as needed (hip pain). ) 1 Tube 5 More than a month at Unknown time  . loperamide (IMODIUM A-D) 2 MG tablet Take 2-4 mg by mouth as needed for diarrhea or loose stools.   Unknown at Unknown time  . meclizine (ANTIVERT) 25 MG tablet Take 25 mg by mouth 2 (two) times daily as needed for dizziness or nausea.    More than a month at Unknown time  . nitroGLYCERIN (NITROSTAT) 0.4 MG SL tablet Place 1 tablet (0.4 mg total) under the tongue every 5 (five) minutes as needed for chest pain. 1 tablet 0 More than a month at Unknown time  . ondansetron  (ZOFRAN) 4 MG tablet Take 1 tablet (4 mg total) by mouth every 8 (eight) hours as needed for nausea or vomiting. 12 tablet 0 More than a month at Unknown time    Current Facility-Administered Medications  Medication Dose Route Frequency Provider Last Rate Last Dose  . [START ON 03/24/2019] acetaminophen (TYLENOL) tablet 1,000 mg  1,000 mg Oral On Call to OR Michael Boston, MD      . bupivacaine liposome (EXPAREL) 1.3 % injection 266 mg  20 mL Infiltration To OR Michael Boston, MD      . cefTRIAXone (ROCEPHIN) 2 g in sodium chloride 0.9 % 100 mL IVPB  2 g Intravenous To Rosemarie Beath, MD       And  . metroNIDAZOLE (FLAGYL) IVPB 500 mg  500 mg Intravenous To Rosemarie Beath, MD      . Chlorhexidine Gluconate Cloth 2 % PADS 6 each  6 each Topical Once Michael Boston, MD       And  . Chlorhexidine Gluconate Cloth 2 % PADS 6 each  6 each Topical Once Michael Boston, MD      . lactated ringers infusion   Intravenous Continuous Oleta Mouse, MD 10 mL/hr at 03/23/19 1353       No Known Allergies  BP (!) 148/84   Pulse (!) 54   Temp 97.8 F (36.6 C) (Oral)   Resp (!) 99   Ht 6' (1.829 [name])   Wt 86.6 kg   SpO2 (!) 20%   BMI 25.90 kg/[name]   Labs: No results found for this or any previous visit (from the past 48 hour(s)).  Imaging / Studies: No results found.   [name], [name].D., F.A.C.S. Gastrointestinal and Minimally Invasive Surgery [facility] [address], [address]  [place], [place] [address] [phone] Main / Paging  03/23/2019 3:58 PM     [name]

## 2019-03-23 NOTE — Transfer of Care (Signed)
Immediate Anesthesia Transfer of Care Note  Patient: Johnny [name]  Procedure(s) Performed: SINGLE SITE LAPAROSCOPIC CHOLECYSTECTOMY WITH INTRAOPERATIVE CHOLANGIOGRAM (N/A Abdomen)  Patient Location: PACU  Anesthesia Type:General  Level of Consciousness: drowsy, patient cooperative and responds to stimulation  Airway & Oxygen Therapy: Patient Spontanous Breathing  Post-op Assessment: Report given to RN and Post -op Vital signs reviewed and stable  Post vital signs: Reviewed and stable  Last Vitals:  Vitals Value Taken Time  BP    Temp    Pulse    Resp    SpO2      Last Pain:  Vitals:   03/23/19 1343  TempSrc:   PainSc: 5       Patients Stated Pain Goal: 3 (11/65/79 0383)  Complications: No apparent anesthesia complications

## 2019-03-23 NOTE — Anesthesia Procedure Notes (Signed)
Procedure Name: Intubation Date/Time: 03/23/2019 4:35 PM Performed by: Oletta Lamas, CRNA Pre-anesthesia Checklist: Patient identified, Emergency Drugs available, Suction available and Patient being monitored Patient Re-evaluated:Patient Re-evaluated prior to induction Oxygen Delivery Method: Circle System Utilized Preoxygenation: Pre-oxygenation with 100% oxygen Induction Type: IV induction Ventilation: Mask ventilation without difficulty Laryngoscope Size: Miller and 2 Tube type: Oral Tube size: 7.5 mm Number of attempts: 1 Airway Equipment and Method: Stylet Placement Confirmation: ETT inserted through vocal cords under direct vision,  positive ETCO2 and breath sounds checked- equal and bilateral Secured at: 22 cm Tube secured with: Tape Dental Injury: Teeth and Oropharynx as per pre-operative assessment

## 2019-03-23 NOTE — Discharge Instructions (Signed)
LAPAROSCOPIC SURGERY: POST OP INSTRUCTIONS ° °###################################################################### ° °EAT °Gradually transition to a high fiber diet with a fiber supplement over the next few weeks after discharge.  Start with a pureed / full liquid diet (see below) ° °WALK °Walk an hour a day.  Control your pain to do that.   ° °CONTROL PAIN °Control pain so that you can walk, sleep, tolerate sneezing/coughing, go up/down stairs. ° °HAVE A BOWEL MOVEMENT DAILY °Keep your bowels regular to avoid problems.  OK to try a laxative to override constipation.  OK to use an antidairrheal to slow down diarrhea.  Call if not better after 2 tries ° °CALL IF YOU HAVE PROBLEMS/CONCERNS °Call if you are still struggling despite following these instructions. °Call if you have concerns not answered by these instructions ° °###################################################################### ° ° ° °1. DIET: Follow a light bland diet the first 24 hours after arrival home, such as soup, liquids, crackers, etc.  Be sure to include lots of fluids daily.  Avoid fast food or heavy meals as your are more likely to get nauseated.  Eat a low fat the next few days after surgery.   ° °2. Take your usually prescribed home medications unless otherwise directed. ° °3. PAIN CONTROL: °a. Pain is best controlled by a usual combination of three different methods TOGETHER: °i. Ice/Heat °ii. Over the counter pain medication °iii. Prescription pain medication °b. Most patients will experience some swelling and bruising around the incisions.  Ice packs or heating pads (30-60 minutes up to 6 times a day) will help. Use ice for the first few days to help decrease swelling and bruising, then switch to heat to help relax tight/sore spots and speed recovery.  Some people prefer to use ice alone, heat alone, alternating between ice & heat.  Experiment to what works for you.  Swelling and bruising can take several weeks to resolve.   °c. It  is helpful to take an over-the-counter pain medication regularly for the first few weeks.  Choose one of the following that works best for you: °i. Naproxen (Aleve, etc)  Two 220mg tabs twice a day °ii. Ibuprofen (Advil, etc) Three 200mg tabs four times a day (every meal & bedtime) °iii. Acetaminophen (Tylenol, etc) 500-650mg four times a day (every meal & bedtime) °d. A  prescription for pain medication (such as oxycodone, hydrocodone, tramadol, gabapentin, methocarbamol, etc) should be given to you upon discharge.  Take your pain medication as prescribed.  °i. If you are having problems/concerns with the prescription medicine (does not control pain, nausea, vomiting, rash, itching, etc), please call us [phone] to see if we need to switch you to a different pain medicine that will work better for you and/or control your side effect better. °ii. If you need a refill on your pain medication, please give us 48 hour notice.  contact your pharmacy.  They will contact our office to request authorization. Prescriptions will not be filled after 5 pm or on week-ends ° °4. Avoid getting constipated.   °a. Between the surgery and the pain medications, it is common to experience some constipation.   °b. Increasing fluid intake and taking a fiber supplement (such as Metamucil, Citrucel, FiberCon, MiraLax, etc) 1-2 times a day regularly will usually help prevent this problem from occurring.   °c. A mild laxative (prune juice, Milk of Magnesia, MiraLax, etc) should be taken according to package directions if there are no bowel movements after 48 hours.   °5. Watch out for   diarrhea.   °a. If you have many loose bowel movements, simplify your diet to bland foods & liquids for a few days.   °b. Stop any stool softeners and decrease your fiber supplement.   °c. Switching to mild anti-diarrheal medications (Kayopectate, Pepto Bismol) can help.   °d. If this worsens or does not improve, please call us. ° °6. Wash / shower every  day.  You Odea shower over the dressings as they are waterproof.  Continue to shower over incision(s) after the dressing is off. ° °7. Remove your waterproof bandages 5 days after surgery.  You Boom leave the incision open to air.  You Maloney replace a dressing/Band-Aid to cover the incision for comfort if you wish.  ° °8. ACTIVITIES as tolerated:   °a. You Stary resume regular (light) daily activities beginning the next day--such as daily self-care, walking, climbing stairs--gradually increasing activities as tolerated.  If you can walk 30 minutes without difficulty, it is safe to try more intense activity such as jogging, treadmill, bicycling, low-impact aerobics, swimming, etc. °b. Save the most intensive and strenuous activity for last such as sit-ups, heavy lifting, contact sports, etc  Refrain from any heavy lifting or straining until you are off narcotics for pain control.   °c. DO NOT PUSH THROUGH PAIN.  Let pain be your guide: If it hurts to do something, don't do it.  Pain is your body warning you to avoid that activity for another week until the pain goes down. °d. You [address] drive when you are no longer taking prescription pain medication, you can comfortably wear a seatbelt, and you can safely maneuver your car and apply brakes. °e. You Kron have sexual intercourse when it is comfortable. ° °9. FOLLOW UP in our office °a. Please call [facility] at [phone] to set up an appointment to see your surgeon in the office for a follow-up appointment approximately 2-3 weeks after your surgery. °b. Make sure that you call for this appointment the day you arrive home to insure a convenient appointment time. ° °10. IF YOU HAVE DISABILITY OR FAMILY LEAVE FORMS, BRING THEM TO THE OFFICE FOR PROCESSING.  DO NOT GIVE THEM TO YOUR DOCTOR. ° ° °WHEN TO CALL US [phone]: °1. Poor pain control °2. Reactions / problems with new medications (rash/itching, nausea, etc)  °3. Fever over 101.5 F (38.5 C) °4. Inability to  urinate °5. Nausea and/or vomiting °6. Worsening swelling or bruising °7. Continued bleeding from incision. °8. Increased pain, redness, or drainage from the incision ° ° The clinic staff is available to answer your questions during regular business hours (8:30am-5pm).  Please don’t hesitate to call and ask to speak to one of our nurses for clinical concerns.  ° If you have a medical emergency, go to the nearest emergency room or call 911. ° A surgeon from [facility] is always on call at the hospitals ° ° °[facility] °[address], [address], [place], [place]  [address] ? °MAIN: [phone] ? TOLL FREE: [phone] ?  °FAX [phone] °[email or web address] ° ° ° ° ° °Cholecystitis ° °Cholecystitis is inflammation of the gallbladder. It is often called a gallbladder attack. The gallbladder is a pear-shaped organ that lies beneath the liver on the right side of the body. The gallbladder stores bile, which is a fluid that helps the body digest fats. If bile builds up in your gallbladder, your gallbladder becomes inflamed. °This condition Deavers occur suddenly. Cholecystitis is   a serious condition and requires treatment. °What are the causes? °The most common cause of this condition is gallstones. Gallstones can block the tube (duct) that carries bile out of your gallbladder. This causes bile to build up. °Other causes include: °· Damage to the gallbladder due to a decrease in blood flow. °· Infections in the bile ducts. °· Scars or kinks in the bile ducts. °· Tumors in the liver, pancreas, or gallbladder. °What increases the risk? °You are more likely to develop this condition if: °· You have sickle cell disease. °· You take birth control pills or use estrogen. °· You have alcoholic liver disease. °· You have liver cirrhosis. °· You have your nutrition delivered through a vein (parenteral nutrition). °· You are critically ill. °· You do not eat or drink for a long  time. This is also called "fasting." °· You are obese. °· You lose weight too fast. °· You are pregnant. °· You have high levels of fat (triglycerides) in the blood. °· You have pancreatitis. °What are the signs or symptoms? °Symptoms of this condition include: °· Pain in the abdomen, especially in the upper right area of the abdomen. °· Tenderness or bloating in the abdomen. °· Nausea. °· Vomiting. °· Fever. °· Chills. °How is this diagnosed? °This condition is diagnosed with a medical history and physical exam. You Hansmann also have other tests, including: °· Imaging tests, such as: °? An ultrasound of the gallbladder. °? A CT scan of the abdomen. °? A gallbladder nuclear scan (HIDA scan). This scan allows your health care provider to see the bile moving from your liver to your gallbladder and on to your small intestine. °? MRI. °· Blood tests, such as: °? A complete blood count. The white blood cell count Hamill be higher than normal. °? Liver function tests. Certain types of gallstones cause some results to be higher than normal. °How is this treated? °Treatment Maffia include: °· Surgery to remove your gallbladder (cholecystectomy). °· Antibiotic medicine, usually through an IV. °· Fasting for a certain amount of time. °· Giving IV fluids. °· Medicine to treat pain or vomiting. °Follow these instructions at home: °· If you had surgery, follow instructions from your health care provider about home care after the procedure. °Medicines ° °· Take over-the-counter and prescription medicines only as told by your health care provider. °· If you were prescribed an antibiotic medicine, take it as told by your health care provider. Do not stop taking the antibiotic even if you start to feel better. °General instructions °· Follow instructions from your health care provider about what to eat or drink. When you are allowed to eat, avoid eating or drinking anything that triggers your symptoms. °· Do not lift anything that is heavier  than 10 lb (4.5 kg), or the limit that you are told, until your health care provider says that it is safe. °· Do not use any products that contain nicotine or tobacco, such as cigarettes and e-cigarettes. If you need help quitting, ask your health care provider. °· Keep all follow-up visits as told by your health care provider. This is important. °Contact a health care provider if: °· Your pain is not controlled with medicine. °· You have a fever. °Get help right away if: °· Your pain moves to another part of your abdomen or to your back. °· You continue to have symptoms or you develop new symptoms even with treatment. °Summary °· Cholecystitis is inflammation of the gallbladder. °· The most   common cause of this condition is gallstones. Gallstones can block the tube (duct) that carries bile out of your gallbladder. °· Common symptoms are pain in the abdomen, nausea, vomiting, fever, and chills. °· This condition is treated with surgery to remove the gallbladder, medicines, fasting, and IV fluids. °· Follow your health care provider's instructions for eating and drinking. Avoid eating anything that triggers your symptoms. °This information is not intended to replace advice given to you by your health care provider. Make sure you discuss any questions you have with your health care provider. °Document Released: 10/13/2005 Document Revised: 02/19/2018 Document Reviewed: 02/19/2018 °Elsevier Interactive Patient Education © 2019 [employer] ° °

## 2019-03-24 ENCOUNTER — Encounter (HOSPITAL_COMMUNITY): Payer: Self-pay | Admitting: General Practice

## 2019-03-24 NOTE — TOC Initial Note (Addendum)
Transition of Care [facility]) - Initial/Assessment Note    Patient Details  Name: Johnny [name] MRN: [id number] Date of Birth: 08/09/1941  Transition of Care [facility]) CM/SW Contact:    Marilu Favre, RN Phone Number: 03/24/2019, 10:16 AM  Clinical Narrative:                 Patient from home with wife and daughter.   Wife has aides from [employer]. Patient has walker and 3 in 1 at home already . Referral for home health given to [facility] with [place].  Daughter will come pick patient up at discharge.  PT worked with patient and recommending SNF, if he does go home will need hoyer lift and pad and wheelchair.   Spoke to patient at bedside. He does not want to go to SNF. Consented to call Amy on speaker phone. Discussed PT's note and recommendations. Amy has 24 hour care for her mother already. Amy does not want to send her father to SNF due to no visiting and her mother is at home on hospice. She is aware father is max assist and could not stand up. She wants to take her dad home.   She is in agreement with hoyer lift and wheel chair. She would like DME delivered to the home . Called Dr Clyda Greener office spoke to April received permission to enter orders. Once Dr Johney Maine signs [employer] will start processing DME and call Amy for delivery. Amy plans to pick her dad up today, nurse will call her when patient ready to be discharged. Can do PTAR paperwork just in case needed.  Tommi Rumps with [facility] will call Amy to see if patient qualifies for [place] also looking into hiring [employer] for dad.     Expected Discharge Plan: [place] Barriers to Discharge: Continued Medical Work up   Patient Goals and CMS Choice Patient states their goals for this hospitalization and ongoing recovery are:: to go home with my wife CMS Medicare.gov Compare Post Acute Care list provided to:: Patient Choice offered to / list presented to : Patient, Adult Children  Expected Discharge  Plan and Services Expected Discharge Plan: [place]   Discharge Planning Services: CM Consult   Living arrangements for the past 2 months: Single Family Home Expected Discharge Date: 03/24/19               DME Arranged: N/A DME Agency: NA       HH Arranged: PT, OT HH Agency: [place] Date [facility] Agency Contacted: 03/24/19 Time [place]: [id number] Representative spoke with at [place]: Tommi Rumps   Prior Living Arrangements/Services Living arrangements for the past 2 months: [place] with:: Spouse Patient language and need for interpreter reviewed:: Yes Do you feel safe going back to the place where you live?: Yes      Need for Family Participation in Patient Care: Yes (Comment) Care giver support system in place?: Yes (comment) Current home services: DME Criminal Activity/Legal Involvement Pertinent to Current Situation/Hospitalization: No - Comment as needed  Activities of Daily Living Home Assistive Devices/Equipment: (P) Eyeglasses, Dentures (specify type), Cane (specify quad or straight), Built-in shower seat, Grab bars in shower(upper denture, straight cane) ADL Screening (condition at time of admission) Patient's cognitive ability adequate to safely complete daily activities?: (P) Yes Is the patient deaf or have difficulty hearing?: (P) Yes("some" hearing loss) Does the patient have difficulty seeing, even  when wearing glasses/contacts?: (P) Yes(has been seeing double for several months, getting new glasses today) Does the patient have difficulty concentrating, remembering, or making decisions?: (P) No Patient able to express need for assistance with ADLs?: (P) Yes Does the patient have difficulty dressing or bathing?: (P) No Independently performs ADLs?: (P) Yes (appropriate for developmental age) Does the patient have difficulty walking or climbing stairs?: (P) Yes(feels weak and has to use a cane) Weakness of Legs: (P)  Both Weakness of Arms/Hands: (P) None  Permission Sought/Granted Permission sought to share information with : Case Manager, Family Supports Permission granted to share information with : Yes, Verbal Permission Granted  Share Information with NAME: Amy [phone]   Permission granted to share info w AGENCY: [facility]        Emotional Assessment Appearance:: Appears stated age Attitude/Demeanor/Rapport: Engaged Affect (typically observed): Accepting Orientation: : Oriented to Situation, Oriented to  Time, Oriented to Place, Oriented to Self   Psych Involvement: No (comment)  Admission diagnosis:  Chronic cholecystitis [K81.1] Patient Active Problem List   Diagnosis Date Noted  . Acute on chronic cholecystitis s/p lap cholecystectomy 03/23/2019 03/23/2019  . Chronic anticoagulation 03/23/2019  . Chronic cholecystitis 03/23/2019  . Unstable angina ([place]) 11/05/2018  . Tachycardia 10/29/2018  . Pain in left hip 08/13/2018  . Hypothyroidism 11/04/2011  . SVT (supraventricular tachycardia) ([place]) 04/10/2011  . HTN (hypertension) 04/10/2011  . Dyslipidemia 04/10/2011  . Coronary atherosclerosis 11/25/2010  . GERD 11/25/2010  . CONSTIPATION 11/25/2010  . CHEST PAIN UNSPECIFIED 11/25/2010  . PERSONAL HISTORY OF COLONIC POLYPS 11/25/2010   PCP:  Burnard Bunting, MD Pharmacy:   [id number] [place], [address] [place] [address] Phone: [phone] Fax: [phone]     Social Determinants of Health (SDOH) Interventions    Readmission Risk Interventions No flowsheet data found.

## 2019-03-24 NOTE — Care Management Obs Status (Addendum)
[place] NOTIFICATION   Patient Details  Name: Johnny [name] MRN: [id number] Date of Birth: 1941/09/27   Medicare Observation Status Notification Given:     Also explained to daughter [name] [phone]  Jacalyn Lefevre Edson Snowball, RN 03/24/2019, 10:15 AM

## 2019-03-24 NOTE — Plan of Care (Signed)
?  Problem: Elimination: ?Goal: Will not experience complications related to bowel motility ?Outcome: Progressing ?  ?Problem: Pain Managment: ?Goal: General experience of comfort will improve ?Outcome: Progressing ?  ?Problem: Safety: ?Goal: Ability to remain free from injury will improve ?Outcome: Progressing ?  ?

## 2019-03-24 NOTE — Progress Notes (Signed)
AVS given and reviewed with pt. Medications discussed. All questions answered to satisfaction. AVS placed in discharge packet for pt's daughter, Amy, and she is aware of discharge paperwork. Pt escorted off the unit via wheelchair by volunteer services.

## 2019-03-24 NOTE — Care Management (Signed)
    Durable Medical Equipment  (From admission, onward)         Start     Ordered   03/24/19 1350  For home use only DME lightweight manual wheelchair with seat cushion  Once    Comments:  Patient suffers from   Acute on chronic cholecystitis s/p lap cholecystectomy 03/23/2019 which impairs their ability to perform daily activities like ambulating  in the home.  A cane will not resolve  issue with performing activities of daily living. A wheelchair will allow patient to safely perform daily activities. Patient is not able to propel themselves in the home using a standard weight wheelchair due to weakness,   Acute on chronic cholecystitis s/p lap cholecystectomy 03/23/2019. Patient can self propel in the lightweight wheelchair. Length of need life time. Accessories: elevating leg rests (ELRs), wheel locks, extensions and anti-tippers.  Seat and back cushions  Call daughter Amy [phone] for delivery thanks   03/24/19 1351   03/24/19 1345  For home use only DME Other see comment  Once    Comments:  Hoyer lift and hoyer pad   Length of need life time   03/24/19 1347

## 2019-03-24 NOTE — Progress Notes (Signed)
Pt arriving from PACU accompanied with PACU nurse. Pt noted with intermittent confusion and occasional incont. Pt unsteady at this time. Needed assist x2 to stand at bedside to void. Abdominal surgical site C/D/I. Bed exit alarm maintained. Call light and phone in reach. Rounds continued per unit policy and MD order.

## 2019-03-24 NOTE — Evaluation (Signed)
Physical Therapy Evaluation Patient Details Name: Johnny [name] MRN: [id number] DOB: 08/25/1941 Today's Date: 03/24/2019   History of Present Illness  Pt is a 78 y/o male admitted secondary to cholecystitis and is s/p Laparascopic cholecystectomy. PMH includes HTN, CAD, and back surgery.   Clinical Impression  Pt is s/p surgery above with deficits below. Pt with poor balance and weakness and was only able to perform standing this session. Required max A to stand with RW secondary to heavy posterior lean. Feel pt would benefit from SNF level therapies, however, pt [place] not qualify. If pt does not qualify, will require max HH services (HHPT, HHOT, HHaide, HHRN), DME below, and 24/7 assist. Pt's wife unable to provide assist, however, does have a daughter that [place] be able to provide assist. Will continue to follow acutely to maximize functional mobility independence and safety.     Follow Up Recommendations SNF;Supervision/Assistance - 24 hour(if does not qualify, will need max HH services )    Equipment Recommendations  Other (comment);Wheelchair (measurements PT);Wheelchair cushion (measurements PT)(hoyer lift, hoyer lift pad)    Recommendations for Other Services OT consult     Precautions / Restrictions Precautions Precautions: Fall Precaution Comments: Reports he has had multiple falls recently.  Restrictions Weight Bearing Restrictions: No      Mobility  Bed Mobility Overal bed mobility: Needs Assistance Bed Mobility: Rolling;Sidelying to Sit;Sit to Supine Rolling: Mod assist Sidelying to sit: Mod assist   Sit to supine: Min assist   General bed mobility comments: Mod A for assist with rolling to side and for trunk elevation. Increased time to come to sitting. Heavy reliance on UE support to maintain sitting balance. If pt did not have UE support, was reliant on min to mod A to maintain balance. Min A for LE assist to return to supine.   Transfers Overall transfer level: Needs  assistance Equipment used: Rolling walker (2 wheeled) Transfers: Sit to/from Stand Sit to Stand: Max assist         General transfer comment: Max A for lift assist and steadying assist. Pt with heavy posterior lean in standing and unable to correct. Pt leaning heavily on bed for support in standing.   Ambulation/Gait                Stairs            Wheelchair Mobility    Modified Rankin (Stroke Patients Only)       Balance Overall balance assessment: Needs assistance Sitting-balance support: Bilateral upper extremity supported;Feet supported Sitting balance-Leahy Scale: Poor Sitting balance - Comments: Heavy reliance on UE support. If he did not have UE support was reliant on min to mod A for sitting balance.  Postural control: Posterior lean Standing balance support: Bilateral upper extremity supported;During functional activity Standing balance-Leahy Scale: Poor Standing balance comment: Heavy posterior lean in standing and reliant on max A to maintain standing balance.                              Pertinent Vitals/Pain Pain Assessment: Faces Faces Pain Scale: Hurts even more Pain Location: abdomen Pain Descriptors / Indicators: Grimacing;Guarding Pain Intervention(s): Limited activity within patient's tolerance;Monitored during session;Repositioned    Home Living Family/patient expects to be discharged to:: Private residence Living Arrangements: Spouse/significant other Available Help at Discharge: Family Type of Home: House Home Access: Ramped entrance     Home Layout: One level Home Equipment: Environmental consultant - 2  wheels;Bedside commode;Cane - single point Additional Comments: Wife has Parkinson's and cannot provide assistance. Reports his wife has caretakers, but they do not help him with daily tasks.     Prior Function Level of Independence: Independent with assistive device(s)         Comments: Was using cane for ambulation     Hand  Dominance        Extremity/Trunk Assessment   Upper Extremity Assessment Upper Extremity Assessment: Defer to OT evaluation    Lower Extremity Assessment Lower Extremity Assessment: Generalized weakness    Cervical / Trunk Assessment Cervical / Trunk Assessment: Kyphotic  Communication   Communication: No difficulties  Cognition Arousal/Alertness: Awake/alert Behavior During Therapy: WFL for tasks assessed/performed Overall Cognitive Status: No family/caregiver present to determine baseline cognitive functioning                                 General Comments: Pt seemed to be unaware of deficits and with decreased safety awareness. Pt with heavy posterior lean in standing and requesting to walk and stating "I think I can do it"      General Comments      Exercises     Assessment/Plan    PT Assessment Patient needs continued PT services  PT Problem List Decreased strength;Decreased balance;Decreased mobility;Decreased cognition;Decreased safety awareness;Decreased knowledge of use of DME;Decreased knowledge of precautions;Pain       PT Treatment Interventions DME instruction;Gait training;Functional mobility training;Therapeutic exercise;Therapeutic activities;Balance training;Patient/family education    PT Goals (Current goals can be found in the Care Plan section)  Acute Rehab PT Goals Patient Stated Goal: to walk  PT Goal Formulation: With patient Time For Goal Achievement: 04/07/19 Potential to Achieve Goals: Good    Frequency Min 3X/week   Barriers to discharge        Co-evaluation               AM-PAC PT "6 Clicks" Mobility  Outcome Measure Help needed turning from your back to your side while in a flat bed without using bedrails?: A Lot Help needed moving from lying on your back to sitting on the side of a flat bed without using bedrails?: A Lot Help needed moving to and from a bed to a chair (including a wheelchair)?: Total Help  needed standing up from a chair using your arms (e.g., wheelchair or bedside chair)?: Total Help needed to walk in hospital room?: Total Help needed climbing 3-5 steps with a railing? : Total 6 Click Score: 8    End of Session Equipment Utilized During Treatment: Gait belt Activity Tolerance: Patient tolerated treatment well Patient left: in bed;with call bell/phone within reach;with bed alarm set Nurse Communication: Mobility status PT Visit Diagnosis: Unsteadiness on feet (R26.81);Muscle weakness (generalized) (M62.81);Difficulty in walking, not elsewhere classified (R26.2)    Time: 9470-9628 PT Time Calculation (min) (ACUTE ONLY): 23 min   Charges:   PT Evaluation $PT Eval Moderate Complexity: 1 Mod PT Treatments $Therapeutic Activity: 8-22 mins        Leighton Ruff, PT, DPT  Acute Rehabilitation Services  Pager: [phone] Office: [phone]   Rudean Hitt 03/24/2019, 1:12 PM

## 2019-03-24 NOTE — Progress Notes (Signed)
[place] Johnny [name] [id number] 05/21/1941  CARE TEAM:  PCP: Burnard Bunting, MD  Outpatient Care Team: Patient Care Team: Burnard Bunting, MD as PCP - General [place], Peter M, MD as PCP - Cardiology (Cardiology) Michael Boston, MD as Consulting Physician (General Surgery)  Inpatient Treatment Team: Treatment Team: Attending Provider: Michael Boston, MD; Registered Nurse: Kennith Center, RN; Registered Nurse: Racheal Patches, RN; Technician: Warden Fillers, NT; Case Manager: Robyne Askew, RN; Utilization Review: Miringu, Mikael Spray, RN   Problem List:   Principal Problem:   Acute on chronic cholecystitis s/p lap cholecystectomy 03/23/2019 Active Problems:   Coronary atherosclerosis   GERD   HTN (hypertension)   Dyslipidemia   Chronic anticoagulation   Chronic cholecystitis   1 Day Post-Op  03/23/2019  Procedure(s): SINGLE SITE LAPAROSCOPIC CHOLECYSTECTOMY WITH INTRAOPERATIVE CHOLANGIOGRAM    Assessment  Recovering  [facility] Stay = 0 days)  Plan:  -Advance to solid diet -Physical therapy and Occupational Therapy evaluations.  Most likely would benefit from home health for stability and safety visit.  Daughter very involved -GERD - PPI HTN controlled  Continue chronic pain regimen -VTE prophylaxis- SCDs, etc -mobilize as tolerated to help recovery D/C patient from hospital when patient meets criteria (anticipate later today):  Tolerating oral intake well Ambulating well Adequate pain control without IV medications Urinating  Having flatus Disposition planning in place   25 minutes spent in review, evaluation, examination, counseling, and coordination of care.  More than 50% of that time was spent in counseling.  I updated the patient's status to the patient and nurse.  Recommendations were made.  Questions were answered.  They expressed understanding & appreciation.  03/24/2019    Subjective: (Chief complaint)  Patient was little confused and unsteady  last night.  Able to walk in hallway with help  Sleeping well.  Feeling better.  Tolerated liquids.  Hungry for solid diet.  Nursing just outside room.patient a little confused and study last night.  Sleeping well.  Feeling better.  Tolerated liquids.  Hungry for solid diet.  Nursing just outside rectum.  Objective:  Vital signs:  Vitals:   03/23/19 2035 03/23/19 2116 03/23/19 2231 03/24/19 0442  BP: 133/66 (!) 164/81 (!) 147/88 111/66  Pulse: 83 85 92 90  Resp: 15 18 18 13   Temp: (!) 97.3 F (36.3 C) 97.8 F (36.6 C) 97.6 F (36.4 C) 98 F (36.7 C)  TempSrc:  Oral Oral Oral  SpO2: 94% 96% 95% 94%  Weight:      Height:        Last BM Date: 03/23/19  Intake/Output   Yesterday:  05/27 0701 - 05/28 0700 In: 2144.5 [P.O.:300; I.V.:1844.5] Out: 1050 [Urine:1000; Blood:50] This shift:  No intake/output data recorded.  Bowel function:  Flatus: YES  BM:  No  Drain: (No drain)   Physical Exam:  General: Pt resting comfortably.  Slightly started when gently awakened at first but then becomes more oriented and relaxed.   Eyes: PERRL, normal EOM.  Sclera clear.  No icterus Neuro: CN II-XII intact w/o focal sensory/motor deficits. Lymph: No head/neck/groin lymphadenopathy Psych:  No delerium/psychosis/paranoia HENT: Normocephalic, Mucus membranes moist.  No thrush Neck: Supple, No tracheal deviation Chest: No chest wall pain w good excursion CV:  Pulses intact.  Regular rhythm MS: Normal AROM mjr joints.  No obvious deformity  Abdomen: Soft.  Nondistended.  Mildly tender at incisions only.  No evidence of peritonitis.  No incarcerated hernias.  Ext:  No deformity.  No mjr  edema.  No cyanosis Skin: No petechiae / purpura  Results:   Cultures: Recent Results (from the past 720 hour(s))  Novel Coronavirus, NAA (hospital order; send-out to ref lab)     Status: None   Collection Time: 03/18/19 12:50 PM  Result Value Ref Range Status   SARS-CoV-2, NAA NOT  DETECTED NOT DETECTED Final    Comment: (NOTE) Testing was performed using the cobas(R) SARS-CoV-2 test. This test was developed and its performance characteristics determined by [employer]. This test has not been FDA cleared or approved. This test has been authorized by FDA under an Emergency Use Authorization (EUA). This test is only authorized for the duration of time the declaration that circumstances exist justifying the authorization of the emergency use of in vitro diagnostic tests for detection of SARS-CoV-2 virus and/or diagnosis of COVID-19 infection under section 564(b)(1) of the Act, 21 U.S.C. 759FMB-8(G)(6), unless the authorization is terminated or revoked sooner. When diagnostic testing is negative, the possibility of a false negative result should be considered in the context of a patient's recent exposures and the presence of clinical signs and symptoms consistent with COVID-19. An individual without symptoms of COVID-19 and who is not shedding SARS-CoV-2 virus would expect to have  a negative (not detected) result in this assay. Performed At: [facility] [address] [place], [place] [id number] Rush Farmer MD XB:[id number]    [place]  Final    Comment: Performed at [facility], [place] [address] [place], [place] [address]    Labs: No results found for this or any previous visit (from the past 48 hour(s)).  Imaging / Studies: Dg Cholangiogram Operative  Result Date: 03/24/2019 CLINICAL DATA:  Gallstones EXAM: INTRAOPERATIVE CHOLANGIOGRAM TECHNIQUE: Cholangiographic images from the C-arm fluoroscopic device were submitted for interpretation post-operatively. Please see the procedural report for the amount of contrast and the fluoroscopy time utilized. COMPARISON:  None. FINDINGS: Contrast fills the biliary tree and duodenum without filling defects in the common bile duct. IMPRESSION: Patent biliary tree.  Electronically Signed   By: Marybelle Killings M.D.   On: 03/24/2019 07:25    Medications / Allergies: per chart  Antibiotics: Anti-infectives (From admission, onward)   Start     Dose/Rate Route Frequency Ordered Stop   03/23/19 1330  cefTRIAXone (ROCEPHIN) 2 g in sodium chloride 0.9 % 100 mL IVPB     2 g 200 mL/hr over 30 Minutes Intravenous To [facility] 03/22/19 1329 03/23/19 1619   03/23/19 1330  metroNIDAZOLE (FLAGYL) IVPB 500 mg     500 mg 100 mL/hr over 60 Minutes Intravenous To ShortStay Surgical 03/22/19 1329 03/23/19 1645        Note: Portions of this report [place] have been transcribed using voice recognition software. Every effort was made to ensure accuracy; however, inadvertent computerized transcription errors [place] be present.   Any transcriptional errors that result from this process are unintentional.     [name], MD, FACS, MASCRS Gastrointestinal and Minimally Invasive Surgery    [address], [place], [place] [address] [phone] Main / Paging [phone] Fax

## 2019-03-24 NOTE — Plan of Care (Signed)
  Problem: Activity: Goal: Risk for activity intolerance will decrease 03/24/2019 0357 by Josepha Pigg, RN Outcome: Progressing 03/24/2019 0357 by Josepha Pigg, RN Outcome: Progressing   Problem: Coping: Goal: Level of anxiety will decrease 03/24/2019 0357 by Josepha Pigg, RN Outcome: Progressing 03/24/2019 0357 by Josepha Pigg, RN Outcome: Progressing   Problem: Elimination: Goal: Will not experience complications related to bowel motility 03/24/2019 0357 by Josepha Pigg, RN Outcome: Progressing 03/24/2019 0357 by Josepha Pigg, RN Outcome: Progressing   Problem: Safety: Goal: Ability to remain free from injury will improve 03/24/2019 0357 by Josepha Pigg, RN Outcome: Progressing 03/24/2019 0357 by Josepha Pigg, RN Outcome: Progressing

## 2019-03-24 NOTE — Progress Notes (Deleted)
Patient suffers from weakness, difficulty with ambulation which impairs their ability to perform daily activities like ambulation and ADLs in the home.  A walker alone will not resolve the issues with performing activities of daily living. A wheelchair will allow patient to safely perform daily activities.  The patient can self propel in the home or has a caregiver who can provide assistance.     Leighton Ruff, PT, DPT  Acute Rehabilitation Services  Pager: [phone] Office: [phone]

## 2019-03-24 NOTE — Plan of Care (Signed)
  Problem: Education: Goal: Knowledge of General Education information will improve Description: Including pain rating scale, medication(s)/side effects and non-pharmacologic comfort measures Outcome: Progressing   Problem: Health Behavior/Discharge Planning: Goal: Ability to manage health-related needs will improve Outcome: Progressing   Problem: Clinical Measurements: Goal: Ability to maintain clinical measurements within normal limits will improve Outcome: Progressing Goal: Will remain free from infection Outcome: Progressing Goal: Respiratory complications will improve Outcome: Progressing Goal: Cardiovascular complication will be avoided Outcome: Progressing   Problem: Nutrition: Goal: Adequate nutrition will be maintained Outcome: Progressing   Problem: Coping: Goal: Level of anxiety will decrease Outcome: Progressing   Problem: Pain Managment: Goal: General experience of comfort will improve Outcome: Progressing   Problem: Safety: Goal: Ability to remain free from injury will improve Outcome: Progressing   Problem: Skin Integrity: Goal: Risk for impaired skin integrity will decrease Outcome: Progressing   

## 2019-03-24 NOTE — Discharge Summary (Addendum)
Physician Discharge Summary    Patient ID: Johnny [name] MRN: [id number] DOB/AGE: Hancock 23, 1942  78 y.o.  Patient Care Team: Burnard Bunting, MD as PCP - General Martinique, Peter M, MD as PCP - Cardiology (Cardiology) Michael Boston, MD as Consulting Physician (General Surgery)  Admit date: 03/23/2019  Discharge date: 03/24/2019  Hospital Stay = 0 days    Discharge Diagnoses:  Principal Problem:   Acute on chronic cholecystitis s/p lap cholecystectomy 03/23/2019 Active Problems:   Coronary atherosclerosis   GERD   HTN (hypertension)   Dyslipidemia   Chronic anticoagulation   Chronic cholecystitis   1 Day Post-Op  03/23/2019  POST-OPERATIVE DIAGNOSIS:   SYMPTOMATIC BILIARY COLIC, PROBABLE CHRONIC CHOLECYSTITIS  SURGERY:  03/23/2019  Procedure(s): SINGLE SITE LAPAROSCOPIC CHOLECYSTECTOMY WITH INTRAOPERATIVE CHOLANGIOGRAM  SURGEON:    Surgeon(s): Michael Boston, MD  Consults: None  Hospital Course:   The patient underwent the surgery above.  Postoperatively, the patient gradually mobilized and advanced to a solid diet.  Pain and other symptoms were treated aggressively.    By the time of discharge, the patient was walking with assistance in the hallways, eating food, having flatus.  Pain was well-controlled on an oral medications.  Based on meeting discharge criteria and continuing to recover, I felt it was safe for the patient to be discharged from the hospital to further recover with close followup. Given his older more fragile state, I thought it would be definitive for home health for physical and occupational therapy.  Patient's family is very involved and they have assisted living help for him.  Nursing in therapy and case management felt it was safe to transition home as well with support and equipment.  Postoperative recommendations were discussed in detail.  They are written as well.  Discharged Condition: fair  Discharge Exam: Blood pressure 119/75, pulse 63,  temperature 98.6 F (37 C), temperature source Oral, resp. rate 16, height 6' (1.829 m), weight 86.6 kg, SpO2 92 %.  General: Pt awake/alert/oriented x3 in No acute distress Eyes: PERRL, normal EOM.  Sclera clear.  No icterus Neuro: CN II-XII intact w/o focal sensory/motor deficits. Lymph: No head/neck/groin lymphadenopathy Psych:  No delerium/psychosis/paranoia.  Some mild memory loss and confusion (which is his baseline) but able to be oriented. HENT: Normocephalic, Mucus membranes moist.  No thrush Neck: Supple, No tracheal deviation Chest: No chest wall pain w good excursion CV:  Pulses intact.  Regular rhythm MS: Normal AROM mjr joints.  No obvious deformity Abdomen: Soft.  Nondistended.  Mildly tender at incisions only.  No evidence of peritonitis.  No incarcerated hernias. Ext:  SCDs BLE.  No mjr edema.  No cyanosis Skin: No petechiae / purpura   Disposition:   Follow-up Information    Michael Boston, MD. Call in 2 weeks.   Specialty:  General Surgery Why:  To follow up after your operation, To follow up after your hospital stay Contact information: [place] [address] [phone]        Care, [facility] Follow up.   Specialty:  Home Health Services Contact information: [place] [address] [phone]           Discharge disposition: 01-Home or Self Care       Discharge Instructions    Call MD for:   Complete by:  As directed    FEVER >101.5 F (Temperatures <101.58F occasionally happen and are not significant)   Call MD for:  extreme fatigue  Complete by:  As directed    Call MD for:  persistant dizziness or light-headedness   Complete by:  As directed    Call MD for:  persistant nausea and vomiting   Complete by:  As directed    Call MD for:  redness, tenderness, or signs of infection (pain, swelling, redness, odor or green/yellow discharge around incision site)   Complete by:  As  directed    Call MD for:  severe uncontrolled pain   Complete by:  As directed    Diet - low sodium heart healthy   Complete by:  As directed    Follow a light diet the first few days at home.  Start with a bland diet such as soups, liquids, starchy foods, low fat foods, etc.   If you feel full, bloated, or constipated, stay on a full liquid or pureed/blenderized diet for a few days until you feel better and no longer constipated. Gradually get back to a regular solid diet.  Avoid fast food or heavy meals the first week as you are more likely to get nauseated.   Discharge instructions   Complete by:  As directed    One the day of your discharge from the hospital (or the next business weekday), please call [facility] to set up or confirm an appointment to see your surgeon in the office for a follow-up appointment.  Usually it is 2-3 weeks after your surgery.  Other concerns If you are not getting better after two weeks or are noticing you are getting worse, contact our office [phone] for further advice.  We Woldt need to adjust your medications, re-evaluate you in the office, send you to the emergency room, or see what other things we can do to help. The clinic staff is available to answer your questions during regular business hours (8:30am-5pm).  Please don't hesitate to call and ask to speak to one of our nurses for clinical concerns.    A surgeon from [facility] is always on call at the hospitals 24 hours/day If you have a medical emergency, go to the nearest emergency room or call 911.   Driving Restrictions   Complete by:  As directed    You [place] drive when you are no longer taking prescription pain medication, you can comfortably wear a seatbelt, and you can safely maneuver your car and apply brakes.   Increase activity slowly   Complete by:  As directed    Lifting restrictions   Complete by:  As directed    You [place] resume regular (light) daily  activities beginning the next day-such as daily self-care, walking, climbing stairs-gradually increasing activities as tolerated.   If you can walk 30 minutes without difficulty, it is safe to try more intense activity such as jogging, treadmill, bicycling, low-impact aerobics, swimming, etc. Save the most intensive and strenuous activity for last such as sit-ups, heavy lifting, contact sports, etc   Refrain from any heavy lifting or straining until you are off narcotics for pain control.   DO NOT PUSH THROUGH PAIN.   Let pain be your guide: If it hurts to do something, don't do it.   Pain is your body warning you to avoid that activity for another week until the pain goes down.   Pennie shower / Bathe   Complete by:  As directed    Wash / shower every day.  You Vavrek shower over the dressings as they are waterproof.  Continue to  shower over incision(s) after the dressing is off.   [place] walk up steps   Complete by:  As directed    Remove dressing in 72 hours   Complete by:  As directed    Remove your waterproof dressings, skin tapes, and other bandages 5 days after surgery.    You [place] leave the incision(s) open to air.   You Volkert replace a dressing/Band-Aid to cover the incision for comfort if you wish.   Sexual Activity Restrictions   Complete by:  As directed    You Hessler have sexual intercourse when it is comfortable. If it hurts to do something, stop.      Allergies as of 03/24/2019   No Known Allergies     Medication List    TAKE these medications   acetaminophen 650 MG CR tablet Commonly known as:  TYLENOL Take 1,300 mg by mouth 2 (two) times daily.   Anoro Ellipta 62.5-25 MCG/INH Aepb Generic drug:  umeclidinium-vilanterol Take 1 puff by mouth daily as needed (shortness of breath).   aspirin 81 MG tablet Take 81 mg by mouth daily.   atorvastatin 10 MG tablet Commonly known as:  LIPITOR Take 1 tablet (10 mg total) by mouth daily.   clopidogrel 75 MG tablet Commonly known  as:  PLAVIX TAKE 1 TABLET BY MOUTH EVERY DAY   diclofenac sodium 1 % Gel Commonly known as:  Voltaren Apply 2 g topically 4 (four) times daily. What changed:    when to take this  reasons to take this   DULoxetine 30 MG capsule Commonly known as:  CYMBALTA Take 30 mg by mouth every evening.   hydrOXYzine 25 MG tablet Commonly known as:  ATARAX/VISTARIL Take 25 mg by mouth at bedtime.   levothyroxine 25 MCG tablet Commonly known as:  SYNTHROID Take 25 mcg by mouth daily.   lisinopril 20 MG tablet Commonly known as:  ZESTRIL Take 1 tablet (20 mg total) by mouth daily.   loperamide 2 MG tablet Commonly known as:  IMODIUM A-D Take 2-4 mg by mouth as needed for diarrhea or loose stools.   Magnesium 400 MG Caps Take 400 mg by mouth every evening.   meclizine 25 MG tablet Commonly known as:  ANTIVERT Take 25 mg by mouth 2 (two) times daily as needed for dizziness or nausea.   metoprolol tartrate 50 MG tablet Commonly known as:  LOPRESSOR Take 1.5 tablets (75 mg total) by mouth 2 (two) times daily.   MOVE FREE JOINT HEALTH ADVANCE PO Take 1 capsule by mouth 2 (two) times daily.   nitroGLYCERIN 0.4 MG SL tablet Commonly known as:  Nitrostat Place 1 tablet (0.4 mg total) under the tongue every 5 (five) minutes as needed for chest pain.   ondansetron 4 MG tablet Commonly known as:  ZOFRAN Take 1 tablet (4 mg total) by mouth every 8 (eight) hours as needed for nausea or vomiting.   pantoprazole 40 MG tablet Commonly known as:  PROTONIX Take 1 tablet (40 mg total) by mouth daily. NEED OV.   senna 8.6 MG Tabs tablet Commonly known as:  SENOKOT Take 1 tablet by mouth daily as needed for mild constipation.   traMADol 50 MG tablet Commonly known as:  ULTRAM Take 1-2 tablets (50-100 mg total) by mouth every 6 (six) hours as needed for moderate pain or severe pain.   VITAMIN B-12 IJ Inject 1 Dose as directed every 30 (thirty) days.   Vitamin D3 50 MCG (2000 UT)  capsule  Take 2,000 Units by mouth every evening.            Durable Medical Equipment  (From admission, onward)         Start     Ordered   03/24/19 1350  For home use only DME lightweight manual wheelchair with seat cushion  Once    Comments:  Patient suffers from   Acute on chronic cholecystitis s/p lap cholecystectomy 03/23/2019 which impairs their ability to perform daily activities like ambulating  in the home.  A cane will not resolve  issue with performing activities of daily living. A wheelchair will allow patient to safely perform daily activities. Patient is not able to propel themselves in the home using a standard weight wheelchair due to weakness,   Acute on chronic cholecystitis s/p lap cholecystectomy 03/23/2019. Patient can self propel in the lightweight wheelchair. Length of need life time. Accessories: elevating leg rests (ELRs), wheel locks, extensions and anti-tippers.  Seat and back cushions  Call daughter Amy [phone] for delivery thanks   03/24/19 1351   03/24/19 1345  For home use only DME Other see comment  Once    Comments:  Hoyer lift and hoyer pad   Length of need life time   03/24/19 1347          Significant Diagnostic Studies:  No results found for this or any previous visit (from the past 26 hour(s)).  No results found.  Past Medical History:  Diagnosis Date  . Acromioclavicular joint arthritis   . Anemia   . B12 deficiency 10/15/11   "take injections q 28 days"  . Coronary artery disease    LHC 10/15/11: LAD stent patent with mid ectasia, distal LAD 50-60%, mid circumflex with ectasia, EF 55-65%.  . Dyslipidemia   . GERD (gastroesophageal reflux disease)   . Glucose intolerance (pre-diabetes)    Borderline glucose intolerance  . Hypertension   . Hypothyroidism   . Post concussive syndrome 2011   "for ~ 6-8 months"  . Rotator cuff tear   . Shortness of breath 10/15/11   "here lately I've been having it on & off any time"  .  SVT (supraventricular tachycardia) ([place])    maintained on beta blocker    Past Surgical History:  Procedure Laterality Date  . BACK SURGERY    . CARDIAC CATHETERIZATION  12/19/08   NORMAL. EF 55%  . CARDIAC CATHETERIZATION  10/15/11  . CARDIOVASCULAR STRESS TEST  10/2010  . CARPAL TUNNEL RELEASE     bilaterally  . CHOLECYSTECTOMY  03/23/2019  . COLONOSCOPY W/ ENDOSCOPIC [place]    . CORONARY ANGIOPLASTY WITH STENT PLACEMENT  9/ 2005   . JOINT REPLACEMENT    . LAPAROSCOPIC CHOLECYSTECTOMY SINGLE SITE WITH INTRAOPERATIVE CHOLANGIOGRAM N/A 03/23/2019   Procedure: SINGLE SITE LAPAROSCOPIC CHOLECYSTECTOMY WITH INTRAOPERATIVE CHOLANGIOGRAM;  Surgeon: Michael Boston, MD;  Location: [facility];  Service: General;  Laterality: N/A;  . LEFT HEART CATH AND CORONARY ANGIOGRAPHY N/A 11/05/2018   Procedure: LEFT HEART CATH AND CORONARY ANGIOGRAPHY;  Surgeon: Martinique, Peter M, MD;  Location: [facility];  Service: Cardiovascular;  Laterality: N/A;  . LEFT HEART CATHETERIZATION WITH CORONARY ANGIOGRAM N/A 10/15/2011   Procedure: LEFT HEART CATHETERIZATION WITH CORONARY ANGIOGRAM;  Surgeon: Sherren Mocha, MD;  Location: [facility] CATH LAB;  Service: Cardiovascular;  Laterality: N/A;  . LUMBAR LAMINECTOMY  1970's  . NASAL SINUS SURGERY     x2  . reconstructive shoulder surgery     right  .  REPLACEMENT TOTAL KNEE  12/2007   left  . RESECTION TUMOR CLAVICLE RADICAL     Open distal clavicle resection  . SHOULDER SURGERY  02/11/2011   right  . TRIGGER FINGER RELEASE     ? both hands    Social History   Socioeconomic History  . Marital status: Married    Spouse name: Not on file  . Number of children: 2  . Years of education: Not on file  . Highest education level: Not on file  Occupational History    Employer: RETIRED  Social Needs  . Financial resource strain: Not on file  . Food insecurity:    Worry: Not on file    Inability: Not on file  . Transportation needs:    Medical: Not on file     Non-medical: Not on file  Tobacco Use  . Smoking status: Former Smoker    Packs/day: 1.00    Years: 21.00    Pack years: 21.00    Types: Cigarettes    Last attempt to quit: 04/07/1976    Years since quitting: 42.9  . Smokeless tobacco: Never Used  Substance and Sexual Activity  . Alcohol use: Not Currently    Comment: 10/15/11 "haven't drank in 15-20 years"  . Drug use: No  . Sexual activity: Not Currently  Lifestyle  . Physical activity:    Days per week: Not on file    Minutes per session: Not on file  . Stress: Not on file  Relationships  . Social connections:    Talks on phone: Not on file    Gets together: Not on file    Attends religious service: Not on file    Active member of club or organization: Not on file    Attends meetings of clubs or organizations: Not on file    Relationship status: Not on file  . Intimate partner violence:    Fear of current or ex partner: Not on file    Emotionally abused: Not on file    Physically abused: Not on file    Forced sexual activity: Not on file  Other Topics Concern  . Not on file  Social History Narrative  . Not on file    Family History  Problem Relation Age of Onset  . Stroke Mother   . Heart attack Father   . Diabetes Father   . Kidney failure Father     Current Facility-Administered Medications  Medication Dose Route Frequency Provider Last Rate Last Dose  . 0.9 %  sodium chloride infusion  250 mL Intravenous PRN Michael Boston, MD      . acetaminophen (TYLENOL) tablet 1,000 mg  1,000 mg Oral Tor Netters, MD   1,000 mg at 03/24/19 1458  . aspirin chewable tablet 81 mg  81 mg Oral Daily Michael Boston, MD   81 mg at 03/24/19 1030  . atorvastatin (LIPITOR) tablet 10 mg  10 mg Oral Daily Michael Boston, MD   10 mg at 03/24/19 1030  . bisacodyl (DULCOLAX) suppository 10 mg  10 mg Rectal Daily PRN Michael Boston, MD      . cholecalciferol (VITAMIN D3) tablet 2,000 Units  2,000 Units Oral QPM Michael Boston, MD       . diclofenac sodium (VOLTAREN) 1 % transdermal gel 2 g  2 g Topical QID PRN Michael Boston, MD      . diphenhydrAMINE (BENADRYL) 12.5 MG/5ML elixir 12.5 mg  12.5 mg Oral Q6H PRN Michael Boston, MD  Or  . diphenhydrAMINE (BENADRYL) injection 12.5 mg  12.5 mg Intravenous Q6H PRN Michael Boston, MD      . DULoxetine (CYMBALTA) DR capsule 30 mg  30 mg Oral QPM Michael Boston, MD      . enoxaparin (LOVENOX) injection 40 mg  40 mg Subcutaneous Q24H Michael Boston, MD   40 mg at 03/24/19 1029  . gabapentin (NEURONTIN) capsule 300 mg  300 mg Oral BID Michael Boston, MD   300 mg at 03/24/19 1030  . hydrALAZINE (APRESOLINE) injection 5-20 mg  5-20 mg Intravenous Q4H PRN Michael Boston, MD      . HYDROmorphone (DILAUDID) injection 0.5-2 mg  0.5-2 mg Intravenous Q2H PRN Michael Boston, MD   1 mg at 03/24/19 0326  . hydrOXYzine (ATARAX/VISTARIL) tablet 25 mg  25 mg Oral Ardeen Fillers, MD   25 mg at 03/23/19 2317  . lactated ringers bolus 1,000 mL  1,000 mL Intravenous Q8H PRN Michael Boston, MD      . lactated ringers infusion   Intravenous Continuous Michael Boston, MD 10 mL/hr at 03/23/19 1353    . levothyroxine (SYNTHROID) tablet 25 mcg  25 mcg Oral [id number] Michael Boston, MD   25 mcg at 03/24/19 0526  . lip balm (CARMEX) ointment 1 application  1 application Topical BID Michael Boston, MD   1 application at 48/18/56 1030  . lisinopril (ZESTRIL) tablet 20 mg  20 mg Oral Daily Michael Boston, MD   20 mg at 03/24/19 1030  . loperamide (IMODIUM) capsule 2-4 mg  2-4 mg Oral PRN Michael Boston, MD      . magic mouthwash  15 mL Oral QID PRN Michael Boston, MD      . magnesium oxide (MAG-OX) tablet 400 mg  400 mg Oral QPM Michael Boston, MD      . meclizine (ANTIVERT) tablet 25 mg  25 mg Oral BID PRN Michael Boston, MD      . methocarbamol (ROBAXIN) tablet 750 mg  750 mg Oral Q6H PRN Michael Boston, MD      . metoprolol tartrate (LOPRESSOR) injection 5 mg  5 mg Intravenous Q6H PRN Michael Boston, MD      . metoprolol  tartrate (LOPRESSOR) tablet 75 mg  75 mg Oral BID Michael Boston, MD   75 mg at 03/24/19 1030  . nitroGLYCERIN (NITROSTAT) SL tablet 0.4 mg  0.4 mg Sublingual Q5 min PRN Michael Boston, MD      . ondansetron (ZOFRAN-ODT) disintegrating tablet 4 mg  4 mg Oral Q6H PRN Michael Boston, MD       Or  . ondansetron (ZOFRAN) injection 4 mg  4 mg Intravenous Q6H PRN Michael Boston, MD      . ondansetron [facility]) tablet 4 mg  4 mg Oral Q8H PRN Michael Boston, MD      . pantoprazole (PROTONIX) EC tablet 40 mg  40 mg Oral Daily Michael Boston, MD   40 mg at 03/24/19 1030  . polyethylene glycol (MIRALAX / GLYCOLAX) packet 17 g  17 g Oral Daily PRN Michael Boston, MD      . prochlorperazine (COMPAZINE) tablet 10 mg  10 mg Oral Q6H PRN Michael Boston, MD       Or  . prochlorperazine (COMPAZINE) injection 5-10 mg  5-10 mg Intravenous Q6H PRN Michael Boston, MD      . senna (SENOKOT) tablet 8.6 mg  1 tablet Oral Daily PRN Michael Boston, MD      . simethicone [facility]) chewable tablet 40 mg  40 mg Oral Q6H PRN Michael Boston, MD      . sodium chloride flush (NS) 0.9 % injection 3 mL  3 mL Intravenous Gorden Harms, MD      . sodium chloride flush (NS) 0.9 % injection 3 mL  3 mL Intravenous PRN Michael Boston, MD      . traMADol Veatrice Bourbon) tablet 50-100 mg  50-100 mg Oral Q6H PRN Michael Boston, MD      . umeclidinium-vilanterol (ANORO ELLIPTA) 62.5-25 MCG/INH 1 puff  1 puff Inhalation Daily PRN Michael Boston, MD       Current Outpatient Medications  Medication Sig Dispense Refill  . acetaminophen (TYLENOL) 650 MG CR tablet Take 1,300 mg by mouth 2 (two) times daily.     Jearl Klinefelter ELLIPTA 62.5-25 MCG/INH AEPB Take 1 puff by mouth daily as needed (shortness of breath).     [name] aspirin 81 MG tablet Take 81 mg by mouth daily.      [name] atorvastatin (LIPITOR) 10 MG tablet Take 1 tablet (10 mg total) by mouth daily. 90 tablet 3  . Cholecalciferol (VITAMIN D3) 50 MCG (2000 UT) capsule Take 2,000 Units by mouth every evening.    .  clopidogrel (PLAVIX) 75 MG tablet TAKE 1 TABLET BY MOUTH EVERY DAY (Patient taking differently: Take 75 mg by mouth daily. ) 90 tablet 1  . Cyanocobalamin (VITAMIN B-12 IJ) Inject 1 Dose as directed every 30 (thirty) days.     . DULoxetine (CYMBALTA) 30 MG capsule Take 30 mg by mouth every evening.    . Glucos-Chond-Hyal Ac-Ca Fructo (MOVE FREE JOINT HEALTH ADVANCE PO) Take 1 capsule by mouth 2 (two) times daily.    . hydrOXYzine (ATARAX) 25 MG tablet Take 25 mg by mouth at bedtime.     [name] levothyroxine (SYNTHROID, LEVOTHROID) 25 MCG tablet Take 25 mcg by mouth daily.      [name] lisinopril (PRINIVIL,ZESTRIL) 20 MG tablet Take 1 tablet (20 mg total) by mouth daily. 90 tablet 3  . Magnesium 400 MG CAPS Take 400 mg by mouth every evening.    . metoprolol tartrate (LOPRESSOR) 50 MG tablet Take 1.5 tablets (75 mg total) by mouth 2 (two) times daily. 270 tablet 3  . pantoprazole (PROTONIX) 40 MG tablet Take 1 tablet (40 mg total) by mouth daily. NEED OV. 180 tablet 1  . senna (SENOKOT) 8.6 MG TABS tablet Take 1 tablet by mouth daily as needed for mild constipation.     . diclofenac sodium (VOLTAREN) 1 % GEL Apply 2 g topically 4 (four) times daily. (Patient taking differently: Apply 2 g topically 4 (four) times daily as needed (hip pain). ) 1 Tube 5  . loperamide (IMODIUM A-D) 2 MG tablet Take 2-4 mg by mouth as needed for diarrhea or loose stools.    . meclizine (ANTIVERT) 25 MG tablet Take 25 mg by mouth 2 (two) times daily as needed for dizziness or nausea.     . nitroGLYCERIN (NITROSTAT) 0.4 MG SL tablet Place 1 tablet (0.4 mg total) under the tongue every 5 (five) minutes as needed for chest pain. 1 tablet 0  . ondansetron (ZOFRAN) 4 MG tablet Take 1 tablet (4 mg total) by mouth every 8 (eight) hours as needed for nausea or vomiting. 12 tablet 0  . traMADol (ULTRAM) 50 MG tablet Take 1-2 tablets (50-100 mg total) by mouth every 6 (six) hours as needed for moderate pain or severe pain. 20 tablet 0     No  Known  Allergies  Signed: Morton Peters, MD, FACS, MASCRS Gastrointestinal and Minimally Invasive Surgery    [address] [place], [place] [address] [phone] Main / Paging [phone] Fax   03/24/2019, 5:16 PM

## 2019-03-25 ENCOUNTER — Inpatient Hospital Stay (HOSPITAL_COMMUNITY)
Admission: EM | Admit: 2019-03-25 | Discharge: 2019-03-27 | DRG: 988 | Disposition: A | Discharge status: Home Health | Payer: Medicare Other | Attending: Internal Medicine | Admitting: Internal Medicine

## 2019-03-25 ENCOUNTER — Emergency Department (HOSPITAL_COMMUNITY): Payer: Medicare Other

## 2019-03-25 ENCOUNTER — Other Ambulatory Visit: Payer: Self-pay

## 2019-03-25 ENCOUNTER — Telehealth: Payer: Self-pay | Admitting: Surgery

## 2019-03-25 DIAGNOSIS — F329 Major depressive disorder, single episode, unspecified: Secondary | ICD-10-CM | POA: Diagnosis present

## 2019-03-25 DIAGNOSIS — Z823 Family history of stroke: Secondary | ICD-10-CM

## 2019-03-25 DIAGNOSIS — K30 Functional dyspepsia: Secondary | ICD-10-CM | POA: Diagnosis present

## 2019-03-25 DIAGNOSIS — K812 Acute cholecystitis with chronic cholecystitis: Secondary | ICD-10-CM | POA: Diagnosis not present

## 2019-03-25 DIAGNOSIS — E039 Hypothyroidism, unspecified: Secondary | ICD-10-CM | POA: Diagnosis present

## 2019-03-25 DIAGNOSIS — R410 Disorientation, unspecified: Secondary | ICD-10-CM

## 2019-03-25 DIAGNOSIS — Z833 Family history of diabetes mellitus: Secondary | ICD-10-CM

## 2019-03-25 DIAGNOSIS — I471 Supraventricular tachycardia: Secondary | ICD-10-CM | POA: Diagnosis present

## 2019-03-25 DIAGNOSIS — E785 Hyperlipidemia, unspecified: Secondary | ICD-10-CM | POA: Diagnosis not present

## 2019-03-25 DIAGNOSIS — R1011 Right upper quadrant pain: Secondary | ICD-10-CM | POA: Diagnosis not present

## 2019-03-25 DIAGNOSIS — J9811 Atelectasis: Secondary | ICD-10-CM | POA: Diagnosis present

## 2019-03-25 DIAGNOSIS — I129 Hypertensive chronic kidney disease with stage 1 through stage 4 chronic kidney disease, or unspecified chronic kidney disease: Secondary | ICD-10-CM | POA: Diagnosis present

## 2019-03-25 DIAGNOSIS — G9341 Metabolic encephalopathy: Secondary | ICD-10-CM | POA: Diagnosis not present

## 2019-03-25 DIAGNOSIS — R413 Other amnesia: Secondary | ICD-10-CM | POA: Diagnosis present

## 2019-03-25 DIAGNOSIS — E876 Hypokalemia: Secondary | ICD-10-CM | POA: Diagnosis not present

## 2019-03-25 DIAGNOSIS — E872 Acidosis: Secondary | ICD-10-CM | POA: Diagnosis present

## 2019-03-25 DIAGNOSIS — Z8249 Family history of ischemic heart disease and other diseases of the circulatory system: Secondary | ICD-10-CM

## 2019-03-25 DIAGNOSIS — Z7901 Long term (current) use of anticoagulants: Secondary | ICD-10-CM

## 2019-03-25 DIAGNOSIS — Z7902 Long term (current) use of antithrombotics/antiplatelets: Secondary | ICD-10-CM

## 2019-03-25 DIAGNOSIS — N183 Chronic kidney disease, stage 3 (moderate): Secondary | ICD-10-CM | POA: Diagnosis present

## 2019-03-25 DIAGNOSIS — Z841 Family history of disorders of kidney and ureter: Secondary | ICD-10-CM

## 2019-03-25 DIAGNOSIS — Z20828 Contact with and (suspected) exposure to other viral communicable diseases: Secondary | ICD-10-CM | POA: Diagnosis present

## 2019-03-25 DIAGNOSIS — R778 Other specified abnormalities of plasma proteins: Secondary | ICD-10-CM

## 2019-03-25 DIAGNOSIS — E538 Deficiency of other specified B group vitamins: Secondary | ICD-10-CM | POA: Diagnosis present

## 2019-03-25 DIAGNOSIS — M1990 Unspecified osteoarthritis, unspecified site: Secondary | ICD-10-CM | POA: Diagnosis present

## 2019-03-25 DIAGNOSIS — K838 Other specified diseases of biliary tract: Secondary | ICD-10-CM | POA: Diagnosis present

## 2019-03-25 DIAGNOSIS — R634 Abnormal weight loss: Secondary | ICD-10-CM | POA: Diagnosis present

## 2019-03-25 DIAGNOSIS — K8010 Calculus of gallbladder with chronic cholecystitis without obstruction: Secondary | ICD-10-CM | POA: Diagnosis present

## 2019-03-25 DIAGNOSIS — D72829 Elevated white blood cell count, unspecified: Secondary | ICD-10-CM | POA: Diagnosis present

## 2019-03-25 DIAGNOSIS — Z955 Presence of coronary angioplasty implant and graft: Secondary | ICD-10-CM

## 2019-03-25 DIAGNOSIS — R7989 Other specified abnormal findings of blood chemistry: Secondary | ICD-10-CM | POA: Diagnosis not present

## 2019-03-25 DIAGNOSIS — I25110 Atherosclerotic heart disease of native coronary artery with unstable angina pectoris: Secondary | ICD-10-CM | POA: Diagnosis not present

## 2019-03-25 DIAGNOSIS — T501X5A Adverse effect of loop [high-ceiling] diuretics, initial encounter: Secondary | ICD-10-CM | POA: Diagnosis not present

## 2019-03-25 DIAGNOSIS — Z79891 Long term (current) use of opiate analgesic: Secondary | ICD-10-CM

## 2019-03-25 DIAGNOSIS — Z79890 Hormone replacement therapy (postmenopausal): Secondary | ICD-10-CM

## 2019-03-25 DIAGNOSIS — I2510 Atherosclerotic heart disease of native coronary artery without angina pectoris: Secondary | ICD-10-CM | POA: Diagnosis present

## 2019-03-25 DIAGNOSIS — Z7982 Long term (current) use of aspirin: Secondary | ICD-10-CM

## 2019-03-25 DIAGNOSIS — I10 Essential (primary) hypertension: Secondary | ICD-10-CM | POA: Diagnosis not present

## 2019-03-25 DIAGNOSIS — R627 Adult failure to thrive: Secondary | ICD-10-CM | POA: Diagnosis present

## 2019-03-25 DIAGNOSIS — Z96652 Presence of left artificial knee joint: Secondary | ICD-10-CM | POA: Diagnosis present

## 2019-03-25 DIAGNOSIS — Z9049 Acquired absence of other specified parts of digestive tract: Secondary | ICD-10-CM

## 2019-03-25 DIAGNOSIS — R6510 Systemic inflammatory response syndrome (SIRS) of non-infectious origin without acute organ dysfunction: Secondary | ICD-10-CM | POA: Diagnosis present

## 2019-03-25 DIAGNOSIS — Z87891 Personal history of nicotine dependence: Secondary | ICD-10-CM

## 2019-03-25 DIAGNOSIS — R0902 Hypoxemia: Secondary | ICD-10-CM | POA: Diagnosis not present

## 2019-03-25 DIAGNOSIS — K660 Peritoneal adhesions (postprocedural) (postinfection): Secondary | ICD-10-CM | POA: Diagnosis present

## 2019-03-25 DIAGNOSIS — K219 Gastro-esophageal reflux disease without esophagitis: Secondary | ICD-10-CM | POA: Diagnosis not present

## 2019-03-25 DIAGNOSIS — Z79899 Other long term (current) drug therapy: Secondary | ICD-10-CM

## 2019-03-25 DIAGNOSIS — N179 Acute kidney failure, unspecified: Secondary | ICD-10-CM | POA: Diagnosis present

## 2019-03-25 DIAGNOSIS — H539 Unspecified visual disturbance: Secondary | ICD-10-CM | POA: Diagnosis present

## 2019-03-25 DIAGNOSIS — Z6825 Body mass index (BMI) 25.0-25.9, adult: Secondary | ICD-10-CM

## 2019-03-25 LAB — URINALYSIS, ROUTINE W REFLEX MICROSCOPIC
Bacteria, UA: NONE SEEN
Bilirubin Urine: NEGATIVE
Glucose, UA: NEGATIVE mg/dL
Ketones, ur: NEGATIVE mg/dL
Leukocytes,Ua: NEGATIVE
Nitrite: NEGATIVE
Protein, ur: NEGATIVE mg/dL
Specific Gravity, Urine: 1.012 (ref 1.005–1.030)
pH: 5 (ref 5.0–8.0)

## 2019-03-25 LAB — COMPREHENSIVE METABOLIC PANEL
ALT: 36 U/L (ref 0–44)
AST: 47 U/L — ABNORMAL HIGH (ref 15–41)
Albumin: 3.5 g/dL (ref 3.5–5.0)
Alkaline Phosphatase: 45 U/L (ref 38–126)
Anion gap: 13 (ref 5–15)
BUN: 19 mg/dL (ref 8–23)
CO2: 23 mmol/L (ref 22–32)
Calcium: 9.1 mg/dL (ref 8.9–10.3)
Chloride: 102 mmol/L (ref 98–111)
Creatinine, Ser: 1.35 mg/dL — ABNORMAL HIGH (ref 0.61–1.24)
GFR calc Af Amer: 57 mL/min — ABNORMAL LOW (ref >60)
GFR calc non Af Amer: 50 mL/min — ABNORMAL LOW (ref >60)
Glucose, Bld: 102 mg/dL — ABNORMAL HIGH (ref 70–99)
Potassium: 3.7 mmol/L (ref 3.5–5.1)
Sodium: 138 mmol/L (ref 135–145)
Total Bilirubin: 1 mg/dL (ref 0.3–1.2)
Total Protein: 6.2 g/dL — ABNORMAL LOW (ref 6.5–8.1)

## 2019-03-25 LAB — CBC WITH DIFFERENTIAL/PLATELET
Abs Immature Granulocytes: 0.1 10*3/uL — ABNORMAL HIGH (ref 0.00–0.07)
Basophils Absolute: 0 10*3/uL (ref 0.0–0.1)
Basophils Relative: 0 %
Eosinophils Absolute: 0 10*3/uL (ref 0.0–0.5)
Eosinophils Relative: 0 %
HCT: 44.1 % (ref 39.0–52.0)
Hemoglobin: 14.3 g/dL (ref 13.0–17.0)
Immature Granulocytes: 1 %
Lymphocytes Relative: 6 %
Lymphs Abs: 1 10*3/uL (ref 0.7–4.0)
MCH: 30.8 pg (ref 26.0–34.0)
MCHC: 32.4 g/dL (ref 30.0–36.0)
MCV: 94.8 fL (ref 80.0–100.0)
Monocytes Absolute: 1.5 10*3/uL — ABNORMAL HIGH (ref 0.1–1.0)
Monocytes Relative: 9 %
Neutro Abs: 13.5 10*3/uL — ABNORMAL HIGH (ref 1.7–7.7)
Neutrophils Relative %: 84 %
Platelets: 219 10*3/uL (ref 150–400)
RBC: 4.65 MIL/uL (ref 4.22–5.81)
RDW: 12.9 % (ref 11.5–15.5)
WBC: 16.1 10*3/uL — ABNORMAL HIGH (ref 4.0–10.5)
nRBC: 0 % (ref 0.0–0.2)

## 2019-03-25 LAB — RAPID URINE DRUG SCREEN, HOSP PERFORMED
Amphetamines: NOT DETECTED
Barbiturates: NOT DETECTED
Benzodiazepines: NOT DETECTED
Cocaine: NOT DETECTED
Opiates: NOT DETECTED
Tetrahydrocannabinol: NOT DETECTED

## 2019-03-25 LAB — BRAIN NATRIURETIC PEPTIDE: B Natriuretic Peptide: 196.3 pg/mL — ABNORMAL HIGH (ref 0.0–100.0)

## 2019-03-25 LAB — CBC
HCT: 42.6 % (ref 39.0–52.0)
Hemoglobin: 14.4 g/dL (ref 13.0–17.0)
MCH: 31 pg (ref 26.0–34.0)
MCHC: 33.8 g/dL (ref 30.0–36.0)
MCV: 91.8 fL (ref 80.0–100.0)
Platelets: 195 10*3/uL (ref 150–400)
RBC: 4.64 MIL/uL (ref 4.22–5.81)
RDW: 12.7 % (ref 11.5–15.5)
WBC: 17.2 10*3/uL — ABNORMAL HIGH (ref 4.0–10.5)
nRBC: 0 % (ref 0.0–0.2)

## 2019-03-25 LAB — AMMONIA: Ammonia: 10 umol/L (ref 9–35)

## 2019-03-25 LAB — CREATININE, SERUM
Creatinine, Ser: 1.22 mg/dL (ref 0.61–1.24)
GFR calc Af Amer: >60 mL/min (ref >60)
GFR calc non Af Amer: 56 mL/min — ABNORMAL LOW (ref >60)

## 2019-03-25 LAB — PROTIME-INR
INR: 1.1 (ref 0.8–1.2)
Prothrombin Time: 14.4 seconds (ref 11.4–15.2)

## 2019-03-25 LAB — LACTIC ACID, PLASMA
Lactic Acid, Venous: 1.6 mmol/L (ref 0.5–1.9)
Lactic Acid, Venous: 2 mmol/L (ref 0.5–1.9)

## 2019-03-25 LAB — SARS CORONAVIRUS 2 BY RT PCR (HOSPITAL ORDER, PERFORMED IN ~~LOC~~ HOSPITAL LAB): SARS Coronavirus 2: NEGATIVE

## 2019-03-25 LAB — TROPONIN I
Troponin I: 0.19 ng/mL (ref <0.03)
Troponin I: 0.16 ng/mL (ref <0.03)

## 2019-03-25 MED ORDER — ONDANSETRON HCL 4 MG PO TABS: 4.0000 mg | ORAL_TABLET | Freq: Three times a day (TID) | ORAL | Status: DC | PRN

## 2019-03-25 MED ORDER — HYDROCORTISONE 1 % EX CREA
1.0000 "application " | TOPICAL_CREAM | Freq: Three times a day (TID) | CUTANEOUS | Status: DC | PRN
  Filled 2019-03-25: qty 28

## 2019-03-25 MED ORDER — PHENOL 1.4 % MT LIQD: 1.0000 | OROMUCOSAL | Status: DC | PRN

## 2019-03-25 MED ORDER — IOHEXOL 350 MG/ML SOLN
100.0000 mL | Freq: Once | INTRAVENOUS | Status: AC | PRN
  Administered 2019-03-25: 100 mL via INTRAVENOUS

## 2019-03-25 MED ORDER — TRAMADOL HCL 50 MG PO TABS: 50.0000 mg | ORAL_TABLET | Freq: Four times a day (QID) | ORAL | Status: DC | PRN

## 2019-03-25 MED ORDER — LEVOTHYROXINE SODIUM 25 MCG PO TABS
25.0000 ug | ORAL_TABLET | Freq: Every day | ORAL | Status: DC
  Administered 2019-03-27: 06:00:00 25 ug via ORAL
  Filled 2019-03-25 (×2): qty 1

## 2019-03-25 MED ORDER — SODIUM CHLORIDE 0.9% FLUSH: 3.0000 mL | INTRAVENOUS | Status: DC | PRN

## 2019-03-25 MED ORDER — LOPERAMIDE HCL 2 MG PO CAPS: 2.0000 mg | ORAL_CAPSULE | ORAL | Status: DC | PRN

## 2019-03-25 MED ORDER — DICLOFENAC SODIUM 1 % TD GEL
2.0000 g | Freq: Four times a day (QID) | TRANSDERMAL | Status: DC | PRN
  Filled 2019-03-25: qty 100

## 2019-03-25 MED ORDER — SODIUM CHLORIDE 0.9% FLUSH
3.0000 mL | Freq: Two times a day (BID) | INTRAVENOUS | Status: DC
  Administered 2019-03-26 – 2019-03-27 (×2): 3 mL via INTRAVENOUS

## 2019-03-25 MED ORDER — HYDROXYZINE HCL 25 MG PO TABS
25.0000 mg | ORAL_TABLET | Freq: Every day | ORAL | Status: DC
  Administered 2019-03-25 – 2019-03-26 (×2): 25 mg via ORAL
  Filled 2019-03-25 (×2): qty 1

## 2019-03-25 MED ORDER — ACETAMINOPHEN 500 MG PO TABS
1000.0000 mg | ORAL_TABLET | Freq: Three times a day (TID) | ORAL | Status: DC
  Administered 2019-03-25 – 2019-03-27 (×5): 1000 mg via ORAL
  Filled 2019-03-25 (×5): qty 2

## 2019-03-25 MED ORDER — FUROSEMIDE 10 MG/ML IJ SOLN
20.0000 mg | Freq: Once | INTRAMUSCULAR | Status: AC
  Administered 2019-03-25: 19:00:00 20 mg via INTRAVENOUS
  Filled 2019-03-25: qty 2

## 2019-03-25 MED ORDER — ALUM & MAG HYDROXIDE-SIMETH 200-200-20 MG/5ML PO SUSP: 30.0000 mL | Freq: Four times a day (QID) | ORAL | Status: DC | PRN

## 2019-03-25 MED ORDER — DULOXETINE HCL 30 MG PO CPEP
30.0000 mg | ORAL_CAPSULE | Freq: Every evening | ORAL | Status: DC
  Administered 2019-03-25 – 2019-03-26 (×2): 30 mg via ORAL
  Filled 2019-03-25 (×2): qty 1

## 2019-03-25 MED ORDER — SENNA 8.6 MG PO TABS: 1.0000 | ORAL_TABLET | Freq: Every day | ORAL | Status: DC | PRN

## 2019-03-25 MED ORDER — MAGIC MOUTHWASH
15.0000 mL | Freq: Four times a day (QID) | ORAL | Status: DC | PRN
  Filled 2019-03-25: qty 15

## 2019-03-25 MED ORDER — MECLIZINE HCL 25 MG PO TABS: 25.0000 mg | ORAL_TABLET | Freq: Two times a day (BID) | ORAL | Status: DC | PRN

## 2019-03-25 MED ORDER — SODIUM CHLORIDE 0.9 % IV SOLN
Freq: Once | INTRAVENOUS | Status: AC
  Administered 2019-03-25: 13:00:00 via INTRAVENOUS

## 2019-03-25 MED ORDER — MOVE FREE JOINT HEALTH ADVANCE PO TABS: ORAL_TABLET | Freq: Two times a day (BID) | ORAL | Status: DC

## 2019-03-25 MED ORDER — METOPROLOL TARTRATE 50 MG PO TABS
75.0000 mg | ORAL_TABLET | Freq: Two times a day (BID) | ORAL | Status: DC
  Administered 2019-03-25 – 2019-03-27 (×4): 75 mg via ORAL
  Filled 2019-03-25 (×4): qty 1

## 2019-03-25 MED ORDER — HYDROCORTISONE (PERIANAL) 2.5 % EX CREA
1.0000 "application " | TOPICAL_CREAM | Freq: Four times a day (QID) | CUTANEOUS | Status: DC | PRN
  Filled 2019-03-25: qty 28.35

## 2019-03-25 MED ORDER — BISACODYL 10 MG RE SUPP: 10.0000 mg | Freq: Two times a day (BID) | RECTAL | Status: DC | PRN

## 2019-03-25 MED ORDER — UMECLIDINIUM-VILANTEROL 62.5-25 MCG/INH IN AEPB
1.0000 | INHALATION_SPRAY | Freq: Every day | RESPIRATORY_TRACT | Status: DC | PRN
  Filled 2019-03-25: qty 14

## 2019-03-25 MED ORDER — METHOCARBAMOL 1000 MG/10ML IJ SOLN
1000.0000 mg | Freq: Four times a day (QID) | INTRAVENOUS | Status: DC | PRN
  Filled 2019-03-25: qty 10

## 2019-03-25 MED ORDER — MAGNESIUM OXIDE 400 (241.3 MG) MG PO TABS
400.0000 mg | ORAL_TABLET | Freq: Every evening | ORAL | Status: DC
  Administered 2019-03-25 – 2019-03-26 (×2): 400 mg via ORAL
  Filled 2019-03-25 (×2): qty 1

## 2019-03-25 MED ORDER — LIP MEDEX EX OINT
1.0000 "application " | TOPICAL_OINTMENT | Freq: Two times a day (BID) | CUTANEOUS | Status: DC
  Administered 2019-03-27 (×2): 1 via TOPICAL
  Filled 2019-03-25: qty 7

## 2019-03-25 MED ORDER — SODIUM CHLORIDE 0.9 % IV SOLN: 250.0000 mL | INTRAVENOUS | Status: DC | PRN

## 2019-03-25 MED ORDER — VITAMIN D 25 MCG (1000 UNIT) PO TABS
2000.0000 [IU] | ORAL_TABLET | Freq: Every evening | ORAL | Status: DC
  Administered 2019-03-25 – 2019-03-26 (×2): 2000 [IU] via ORAL
  Filled 2019-03-25 (×2): qty 2

## 2019-03-25 MED ORDER — PANTOPRAZOLE SODIUM 40 MG PO TBEC
40.0000 mg | DELAYED_RELEASE_TABLET | Freq: Every day | ORAL | Status: DC
  Administered 2019-03-25 – 2019-03-27 (×3): 40 mg via ORAL
  Filled 2019-03-25 (×3): qty 1

## 2019-03-25 MED ORDER — MENTHOL 3 MG MT LOZG: 1.0000 | LOZENGE | OROMUCOSAL | Status: DC | PRN

## 2019-03-25 MED ORDER — ENOXAPARIN SODIUM 40 MG/0.4ML ~~LOC~~ SOLN
40.0000 mg | SUBCUTANEOUS | Status: DC
  Administered 2019-03-25 – 2019-03-26 (×2): 40 mg via SUBCUTANEOUS
  Filled 2019-03-25 (×2): qty 0.4

## 2019-03-25 MED ORDER — METHOCARBAMOL 500 MG PO TABS: 1000.0000 mg | ORAL_TABLET | Freq: Four times a day (QID) | ORAL | Status: DC | PRN

## 2019-03-25 MED ORDER — ASPIRIN 81 MG PO CHEW
81.0000 mg | CHEWABLE_TABLET | Freq: Every day | ORAL | Status: DC
  Administered 2019-03-25 – 2019-03-27 (×3): 81 mg via ORAL
  Filled 2019-03-25 (×3): qty 1

## 2019-03-25 MED ORDER — ACETAMINOPHEN ER 650 MG PO TBCR: 1300.0000 mg | EXTENDED_RELEASE_TABLET | Freq: Two times a day (BID) | ORAL | Status: DC

## 2019-03-25 MED ORDER — CLOPIDOGREL BISULFATE 75 MG PO TABS
75.0000 mg | ORAL_TABLET | Freq: Every day | ORAL | Status: DC
  Administered 2019-03-25 – 2019-03-27 (×3): 75 mg via ORAL
  Filled 2019-03-25 (×3): qty 1

## 2019-03-25 MED ORDER — GUAIFENESIN-DM 100-10 MG/5ML PO SYRP: 10.0000 mL | ORAL_SOLUTION | ORAL | Status: DC | PRN

## 2019-03-25 MED ORDER — ATORVASTATIN CALCIUM 10 MG PO TABS
10.0000 mg | ORAL_TABLET | Freq: Every day | ORAL | Status: DC
  Administered 2019-03-25 – 2019-03-27 (×3): 10 mg via ORAL
  Filled 2019-03-25 (×3): qty 1

## 2019-03-25 NOTE — Anesthesia Postprocedure Evaluation (Signed)
Anesthesia Post Note  Patient: Johnny [name]  Procedure(s) Performed: SINGLE SITE LAPAROSCOPIC CHOLECYSTECTOMY WITH INTRAOPERATIVE CHOLANGIOGRAM (N/A Abdomen)     Patient location during evaluation: PACU Anesthesia Type: General Level of consciousness: awake and alert Pain management: pain level controlled Vital Signs Assessment: post-procedure vital signs reviewed and stable Respiratory status: spontaneous breathing, nonlabored ventilation, respiratory function stable and patient connected to nasal cannula oxygen Cardiovascular status: blood pressure returned to baseline and stable Postop Assessment: no apparent nausea or vomiting Anesthetic complications: no    Last Vitals:  Vitals:   03/24/19 1029 03/24/19 1404  BP: 106/70 119/75  Pulse: 62 63  Resp:  16  Temp:  37 C  SpO2: 94% 92%    Last Pain:  Vitals:   03/24/19 1404  TempSrc: Oral  PainSc: 0-No pain                 Ellington Greenslade S

## 2019-03-25 NOTE — ED Notes (Signed)
MD notified of Troponin result

## 2019-03-25 NOTE — ED Notes (Signed)
Spoke with pts daughter and provided update. Allowed pt to speak with daughter on the phone.

## 2019-03-25 NOTE — Progress Notes (Signed)
PHARMACIST - PHYSICIAN ORDER COMMUNICATION  CONCERNING: P&T Medication Policy on Herbal Medications  DESCRIPTION:  This patient's order for:  Move free joint health supplement  has been noted.  This product(s) is classified as an "herbal" or natural product. Due to a lack of definitive safety studies or FDA approval, nonstandard manufacturing practices, plus the potential risk of unknown drug-drug interactions while on inpatient medications, the Pharmacy and Therapeutics Committee does not permit the use of "herbal" or natural products of this type within [facility].   ACTION TAKEN: The pharmacy department is unable to verify this order at this time and your patient has been informed of this safety policy. Please reevaluate patient's clinical condition at discharge and address if the herbal or natural product(s) should be resumed at that time.  Shikha Bibb A. Levada Dy, PharmD, [place] Please utilize Amion for appropriate phone number to reach the unit pharmacist ([place])

## 2019-03-25 NOTE — H&P (Signed)
[facility] History and Physical  Adonte Vanriper [place] JJH:[id number] DOB: 1941/03/09 DOA: 03/25/2019  PCP: Burnard Bunting, MD  Patient coming from: Home  Chief Complaint: Confusion  HPI: Johnny [name] is a 78 y.o. male with a medical history of coronary artery disease, hypertension, hypothyroidism, recently admitted and discharged on 03/24/2019 for laparoscopic cholecystectomy.  Patient presented today to the emergency department for confusion.  Seems the patient was brought to the emergency department as he has had confusion of the past several days.  However per discharge summary on 03/24/2019, he was confused at that time.  Unknown baseline.  Patient does currently endorse shortness of breath more with movement.  He denies current chest pain, abdominal pain, nausea or vomiting, diarrhea or constipation, dizziness or headache, problems with urination, recent illness or travel.  Does state that he has to get to the station and go home.  ED Course: Patient found to be confused.  CT head unremarkable. Noted to have hypoxia.  CTA chest unremarkable for PE.  Patient noted to have an elevated troponin 0.19.  General surgery consulted.  TRH called for admission.  Review of Systems:  All other systems reviewed and are negative.   Past Medical History:  Diagnosis Date   Acromioclavicular joint arthritis    Anemia    B12 deficiency 10/15/11   "take injections q 28 days"   Coronary artery disease    LHC 10/15/11: LAD stent patent with mid ectasia, distal LAD 50-60%, mid circumflex with ectasia, EF 55-65%.   Dyslipidemia    GERD (gastroesophageal reflux disease)    Glucose intolerance (pre-diabetes)    Borderline glucose intolerance   Hypertension    Hypothyroidism    Post concussive syndrome 2011   "for ~ 6-8 months"   Rotator cuff tear    Shortness of breath 10/15/11   "here lately I've been having it on & off any time"   SVT (supraventricular tachycardia) ([place])    maintained on  beta blocker    Past Surgical History:  Procedure Laterality Date   BACK SURGERY     CARDIAC CATHETERIZATION  12/19/08   NORMAL. EF 55%   CARDIAC CATHETERIZATION  10/15/11   CARDIOVASCULAR STRESS TEST  10/2010   CARPAL TUNNEL RELEASE     bilaterally   CHOLECYSTECTOMY  03/23/2019   COLONOSCOPY W/ ENDOSCOPIC [place]     CORONARY ANGIOPLASTY WITH STENT PLACEMENT  9/ 2005    JOINT REPLACEMENT     LAPAROSCOPIC CHOLECYSTECTOMY SINGLE SITE WITH INTRAOPERATIVE CHOLANGIOGRAM N/A 03/23/2019   Procedure: SINGLE SITE LAPAROSCOPIC CHOLECYSTECTOMY WITH INTRAOPERATIVE CHOLANGIOGRAM;  Surgeon: Michael Boston, MD;  Location: [facility];  Service: General;  Laterality: N/A;   LEFT HEART CATH AND CORONARY ANGIOGRAPHY N/A 11/05/2018   Procedure: LEFT HEART CATH AND CORONARY ANGIOGRAPHY;  Surgeon: Martinique, Peter M, MD;  Location: [facility];  Service: Cardiovascular;  Laterality: N/A;   LEFT HEART CATHETERIZATION WITH CORONARY ANGIOGRAM N/A 10/15/2011   Procedure: LEFT HEART CATHETERIZATION WITH CORONARY ANGIOGRAM;  Surgeon: Sherren Mocha, MD;  Location: [facility];  Service: Cardiovascular;  Laterality: N/A;   LUMBAR LAMINECTOMY  1970's   NASAL SINUS SURGERY     x2   reconstructive shoulder surgery     right   REPLACEMENT TOTAL KNEE  12/2007   left   RESECTION TUMOR CLAVICLE RADICAL     Open distal clavicle resection   SHOULDER SURGERY  02/11/2011   right   TRIGGER FINGER RELEASE     ? both hands  Social History:  reports that he quit smoking about 42 years ago. His smoking use included cigarettes. He has a 21.00 pack-year smoking history. He has never used smokeless tobacco. He reports previous alcohol use. He reports that he does not use drugs.  No Known Allergies  Family History  Problem Relation Age of Onset   Stroke Mother    Heart attack Father    Diabetes Father    Kidney failure Father    Prior to Admission medications   Medication Sig Start Date End Date Taking?  Authorizing Provider  acetaminophen (TYLENOL) 650 MG CR tablet Take 1,300 mg by mouth 2 (two) times daily.    Yes [provider]  ANORO ELLIPTA 62.5-25 MCG/INH AEPB Take 1 puff by mouth daily as needed (shortness of breath).  01/06/19  Yes [provider]  aspirin 81 MG tablet Take 81 mg by mouth daily.     Yes [provider]  atorvastatin (LIPITOR) 10 MG tablet Take 1 tablet (10 mg total) by mouth daily. 12/23/18  Yes Martinique, Peter M, MD  Cholecalciferol (VITAMIN D3) 50 MCG (2000 UT) capsule Take 2,000 Units by mouth every evening.   Yes [provider]  clopidogrel (PLAVIX) 75 MG tablet TAKE 1 TABLET BY MOUTH EVERY DAY Patient taking differently: Take 75 mg by mouth daily.  08/16/18  Yes Martinique, Peter M, MD  Cyanocobalamin (VITAMIN B-12 IJ) Inject 1 Dose as directed every 30 (thirty) days.    Yes [provider]  diclofenac sodium (VOLTAREN) 1 % GEL Apply 2 g topically 4 (four) times daily. Patient taking differently: Apply 2 g topically 4 (four) times daily as needed (hip pain).  08/13/18  Yes Aundra Dubin, PA-C  DULoxetine (CYMBALTA) 30 MG capsule Take 30 mg by mouth every evening.   Yes [provider]  Glucos-Chond-Hyal Ac-Ca Fructo (MOVE FREE JOINT HEALTH ADVANCE PO) Take 1 capsule by mouth 2 (two) times daily.   Yes [provider]  hydrOXYzine (ATARAX) 25 MG tablet Take 25 mg by mouth at bedtime.    Yes [provider]  levothyroxine (SYNTHROID, LEVOTHROID) 25 MCG tablet Take 25 mcg by mouth daily.     Yes [provider]  lisinopril (PRINIVIL,ZESTRIL) 20 MG tablet Take 1 tablet (20 mg total) by mouth daily. 11/25/18 11/20/19 Yes Martinique, Peter M, MD  loperamide (IMODIUM A-D) 2 MG tablet Take 2-4 mg by mouth as needed for diarrhea or loose stools.   Yes [provider]  Magnesium 400 MG CAPS Take 400 mg by mouth every evening.   Yes [provider]  meclizine (ANTIVERT) 25 MG tablet Take 25  mg by mouth 2 (two) times daily as needed for dizziness or nausea.    Yes [provider]  metoprolol tartrate (LOPRESSOR) 50 MG tablet Take 1.5 tablets (75 mg total) by mouth 2 (two) times daily. 11/25/18  Yes Martinique, Peter M, MD  nitroGLYCERIN (NITROSTAT) 0.4 MG SL tablet Place 1 tablet (0.4 mg total) under the tongue every 5 (five) minutes as needed for chest pain. 10/15/11  Yes Larey Dresser, MD  ondansetron (ZOFRAN) 4 MG tablet Take 1 tablet (4 mg total) by mouth every 8 (eight) hours as needed for nausea or vomiting. 01/20/19  Yes Mesner, Corene Cornea, MD  pantoprazole (PROTONIX) 40 MG tablet Take 1 tablet (40 mg total) by mouth daily. NEED OV. 11/25/18  Yes Martinique, Peter M, MD  senna (SENOKOT) 8.6 MG TABS tablet Take 1 tablet by mouth daily as  needed for mild constipation.    Yes [provider]  traMADol (ULTRAM) 50 MG tablet Take 1-2 tablets (50-100 mg total) by mouth every 6 (six) hours as needed for moderate pain or severe pain. 03/23/19  Yes Michael Boston, MD    Physical Exam: Vitals:   03/25/19 1255 03/25/19 1300  BP:  (!) 154/73  Pulse: 70   Resp:    Temp:    SpO2: 93%      General: Well developed, well nourished, elderly, NAD, appears stated age  HEENT: NCAT, PERRLA, EOMI, Anicteic Sclera, mucous membranes dry   Neck: Supple, no JVD, no masses  Cardiovascular: S1 S2 auscultated, no rubs, murmurs or gallops. Regular rate and rhythm.  Respiratory: Clear to auscultation bilaterally with equal chest rise  Abdomen: Soft, nontender, nondistended, + bowel sounds, no guarding, surgical scars  Extremities: warm dry without cyanosis clubbing or edema  Neuro: AAOx1 (self), cranial nerves grossly intact. Strength 5/5 in patient's upper and lower extremities bilaterally  Skin: Without rashes exudates or nodules  Psych: Confused, however appropriate mood and affect  Labs on Admission: I have personally reviewed following labs and imaging studies CBC: Recent Labs    Lab 03/25/19 1053  WBC 16.1*  NEUTROABS 13.5*  HGB 14.3  HCT 44.1  MCV 94.8  PLT 263   Basic Metabolic Panel: Recent Labs  Lab 03/25/19 1053  NA 138  K 3.7  CL 102  CO2 23  GLUCOSE 102*  BUN 19  CREATININE 1.35*  CALCIUM 9.1   GFR: Estimated Creatinine Clearance: 48.7 mL/min (A) (by C-G formula based on SCr of 1.35 mg/dL (H)). Liver Function Tests: Recent Labs  Lab 03/25/19 1053  AST 47*  ALT 36  ALKPHOS 45  BILITOT 1.0  PROT 6.2*  ALBUMIN 3.5   No results for input(s): LIPASE, AMYLASE in the last 168 hours. Recent Labs  Lab 03/25/19 1036  AMMONIA 10   Coagulation Profile: Recent Labs  Lab 03/25/19 1053  INR 1.1   Cardiac Enzymes: Recent Labs  Lab 03/25/19 1053  TROPONINI 0.19*   BNP (last 3 results) No results for input(s): PROBNP in the last 8760 hours. HbA1C: No results for input(s): HGBA1C in the last 72 hours. CBG: No results for input(s): GLUCAP in the last 168 hours. Lipid Profile: No results for input(s): CHOL, HDL, LDLCALC, TRIG, CHOLHDL, LDLDIRECT in the last 72 hours. Thyroid Function Tests: No results for input(s): TSH, T4TOTAL, FREET4, T3FREE, THYROIDAB in the last 72 hours. Anemia Panel: No results for input(s): VITAMINB12, FOLATE, FERRITIN, TIBC, IRON, RETICCTPCT in the last 72 hours. Urine analysis:    Component Value Date/Time   COLORURINE YELLOW 03/25/2019 1002   APPEARANCEUR CLEAR 03/25/2019 1002   LABSPEC 1.012 03/25/2019 1002   PHURINE 5.0 03/25/2019 1002   GLUCOSEU NEGATIVE 03/25/2019 1002   HGBUR SMALL (A) 03/25/2019 1002   BILIRUBINUR NEGATIVE 03/25/2019 [place] 03/25/2019 [place] 03/25/2019 1002   UROBILINOGEN 0.2 01/19/2008 1246   NITRITE NEGATIVE 03/25/2019 1002   [place] 03/25/2019 1002   Sepsis Labs: @LABRCNTIP (procalcitonin:4,lacticidven:4) ) Recent Results (from the past 240 hour(s))  Novel Coronavirus, NAA (hospital order; send-out to ref lab)      Status: None   Collection Time: 03/18/19 12:50 PM  Result Value Ref Range Status   SARS-CoV-2, NAA NOT DETECTED NOT DETECTED Final    Comment: (NOTE) Testing was performed using the cobas(R) SARS-CoV-2 test. This test was developed and its performance characteristics determined by [facility]. This test has not been FDA cleared or approved. This test has been authorized by FDA under an Emergency Use Authorization (EUA). This test is only authorized for the duration of time the declaration that circumstances exist justifying the authorization of the emergency use of in vitro diagnostic tests for detection of SARS-CoV-2 virus and/or diagnosis of COVID-19 infection under section 564(b)(1) of the Act, 21 U.S.C. 443XVQ-0(G)(8), unless the authorization is terminated or revoked sooner. When diagnostic testing is negative, the possibility of a false negative result should be considered in the context of a patient's recent exposures and the presence of clinical signs and symptoms consistent with COVID-19. An individual without symptoms of COVID-19 and who is not shedding SARS-CoV-2 virus would expect to have  a negative (not detected) result in this assay. Performed At: [facility] [address] [place], [place] [id number] Rush Farmer MD OI:[id number]    [place]  Final    Comment: Performed at [facility], [place] [address], [place], [place] [address]  SARS Coronavirus 2 (CEPHEID- Performed in [place] hospital lab), Hosp Order     Status: None   Collection Time: 03/25/19 10:03 AM  Result Value Ref Range Status   SARS Coronavirus 2 NEGATIVE NEGATIVE Final    Comment: (NOTE) If result is NEGATIVE SARS-CoV-2 target nucleic acids are NOT DETECTED. The SARS-CoV-2 RNA is generally detectable in upper and lower  respiratory specimens during the acute phase of infection. The lowest  concentration of SARS-CoV-2 viral copies this assay can  detect is 250  copies / mL. A negative result does not preclude SARS-CoV-2 infection  and should not be used as the sole basis for treatment or other  patient management decisions.  A negative result [place] occur with  improper specimen collection / handling, submission of specimen other  than nasopharyngeal swab, presence of viral mutation(s) within the  areas targeted by this assay, and inadequate number of viral copies  (<250 copies / mL). A negative result must be combined with clinical  observations, patient history, and epidemiological information. If result is POSITIVE SARS-CoV-2 target nucleic acids are DETECTED. The SARS-CoV-2 RNA is generally detectable in upper and lower  respiratory specimens dur ing the acute phase of infection.  Positive  results are indicative of active infection with SARS-CoV-2.  Clinical  correlation with patient history and other diagnostic information is  necessary to determine patient infection status.  Positive results do  not rule out bacterial infection or co-infection with other viruses. If result is PRESUMPTIVE POSTIVE SARS-CoV-2 nucleic acids [place] BE PRESENT.   A presumptive positive result was obtained on the submitted specimen  and confirmed on repeat testing.  While 2019 novel coronavirus  (SARS-CoV-2) nucleic acids [place] be present in the submitted sample  additional confirmatory testing [place] be necessary for epidemiological  and / or clinical management purposes  to differentiate between  SARS-CoV-2 and other Sarbecovirus currently known to infect humans.  If clinically indicated additional testing with an alternate test  methodology [phone]) is advised. The SARS-CoV-2 RNA is generally  detectable in upper and lower respiratory sp ecimens during the acute  phase of infection. The expected result is Negative. Fact Sheet for Patients:  StrictlyIdeas.no Fact Sheet for Healthcare  Providers: BankingDealers.co.za This test is not yet approved or cleared by the [place] FDA and has been authorized for detection and/or diagnosis of SARS-CoV-2 by FDA under an Emergency Use Authorization (EUA).  This EUA will remain in effect (meaning this  test can be used) for the duration of the COVID-19 declaration under Section 564(b)(1) of the Act, 21 U.S.C. section 360bbb-3(b)(1), unless the authorization is terminated or revoked sooner. Performed at [facility], [place] [address], [place], [place] [address]      Radiological Exams on Admission: Dg Cholangiogram Operative  Result Date: 03/24/2019 CLINICAL DATA:  Gallstones EXAM: INTRAOPERATIVE CHOLANGIOGRAM TECHNIQUE: Cholangiographic images from the C-arm fluoroscopic device were submitted for interpretation post-operatively. Please see the procedural report for the amount of contrast and the fluoroscopy time utilized. COMPARISON:  None. FINDINGS: Contrast fills the biliary tree and duodenum without filling defects in the common bile duct. IMPRESSION: Patent biliary tree. Electronically Signed   By: Marybelle Killings M.D.   On: 03/24/2019 07:25   Ct Head Wo Contrast  Result Date: 03/25/2019 CLINICAL DATA:  Confusion over the last couple of days EXAM: CT HEAD WITHOUT CONTRAST TECHNIQUE: Contiguous axial images were obtained from the base of the skull through the vertex without intravenous contrast. COMPARISON:  None. FINDINGS: Brain: No evidence of acute infarction, hemorrhage, extra-axial collection, ventriculomegaly, or mass effect. Generalized cerebral atrophy. Periventricular white matter low attenuation likely secondary to microangiopathy. Vascular: Cerebrovascular atherosclerotic calcifications are noted. Skull: Negative for fracture or focal lesion. Sinuses/Orbits: Visualized portions of the orbits are unremarkable. Mastoid sinuses are clear. Mild left maxillary sinus mucosal thickening. Prior sinus  surgery. Other: None. IMPRESSION: No acute intracranial pathology. Electronically Signed   By: Kathreen Devoid   On: 03/25/2019 11:11   Ct Angio Chest Pe W/cm &/or Wo Cm  Result Date: 03/25/2019 CLINICAL DATA:  Elevated D-dimer level. Right upper quadrant pain status post cholecystectomy. EXAM: CT ANGIOGRAPHY CHEST CT ABDOMEN AND PELVIS WITH CONTRAST TECHNIQUE: Multidetector CT imaging of the chest was performed using the standard protocol during bolus administration of intravenous contrast. Multiplanar CT image reconstructions and MIPs were obtained to evaluate the vascular anatomy. Multidetector CT imaging of the abdomen and pelvis was performed using the standard protocol during bolus administration of intravenous contrast. CONTRAST:  112mL OMNIPAQUE IOHEXOL 350 MG/ML SOLN COMPARISON:  CT scan of January 20, 2019. FINDINGS: CTA CHEST FINDINGS Cardiovascular: Satisfactory opacification of the pulmonary arteries to the segmental level. No evidence of pulmonary embolism. Normal heart size. No pericardial effusion. Coronary artery calcifications are noted. Mediastinum/Nodes: No enlarged mediastinal, hilar, or axillary lymph nodes. Thyroid gland, trachea, and esophagus demonstrate no significant findings. Lungs/Pleura: No pneumothorax is noted. No significant pleural effusion is noted. Mild bilateral posterior basilar subsegmental atelectasis is noted. Musculoskeletal: No chest wall abnormality. No acute or significant osseous findings. Review of the MIP images confirms the above findings. CT ABDOMEN and PELVIS FINDINGS Hepatobiliary: Status post cholecystectomy 2 days ago. No significant fluid collection is noted in the gallbladder fossa. No biliary dilatation is noted. The liver is otherwise unremarkable. Mild pneumoperitoneum is noted in epigastric region which most likely is postoperative in etiology. Pancreas: Stable pancreatic head calcifications are noted suggesting chronic pancreatitis. No acute inflammation or  ductal dilatation is noted. Spleen: Normal in size without focal abnormality. Adrenals/Urinary Tract: Adrenal glands appear normal. Right renal cyst is noted. No hydronephrosis or renal obstruction is noted. Urinary bladder is unremarkable. Stomach/Bowel: The stomach is unremarkable. No small bowel dilatation is noted. Dilatation of the right and transverse colon is noted which most likely represents postoperative ileus. The appendix is not visualized. Vascular/Lymphatic: Aortic atherosclerosis. No enlarged abdominal or pelvic lymph nodes. Reproductive: Stable mild prostatic enlargement is noted. Other: No abdominal wall hernia or abnormality. No  abdominopelvic ascites. Musculoskeletal: Multilevel degenerative disc disease is noted in the lower lumbar spine. No acute osseous abnormality is noted. Review of the MIP images confirms the above findings. IMPRESSION: No definite evidence of pulmonary embolus. Coronary artery calcifications are noted. Mild bilateral posterior basilar subsegmental atelectasis is noted. Status post cholecystectomy 2 days ago. No significant fluid collection is noted in the gallbladder fossa. Mild pneumoperitoneum is noted in the epigastric region which most likely is postoperative in etiology. Aortic Atherosclerosis (ICD10-I70.0). Electronically Signed   By: Marijo Conception M.D.   On: 03/25/2019 14:33   Ct Abdomen Pelvis W Contrast  Result Date: 03/25/2019 CLINICAL DATA:  Elevated D-dimer level. Right upper quadrant pain status post cholecystectomy. EXAM: CT ANGIOGRAPHY CHEST CT ABDOMEN AND PELVIS WITH CONTRAST TECHNIQUE: Multidetector CT imaging of the chest was performed using the standard protocol during bolus administration of intravenous contrast. Multiplanar CT image reconstructions and MIPs were obtained to evaluate the vascular anatomy. Multidetector CT imaging of the abdomen and pelvis was performed using the standard protocol during bolus administration of intravenous contrast.  CONTRAST:  159mL OMNIPAQUE IOHEXOL 350 MG/ML SOLN COMPARISON:  CT scan of January 20, 2019. FINDINGS: CTA CHEST FINDINGS Cardiovascular: Satisfactory opacification of the pulmonary arteries to the segmental level. No evidence of pulmonary embolism. Normal heart size. No pericardial effusion. Coronary artery calcifications are noted. Mediastinum/Nodes: No enlarged mediastinal, hilar, or axillary lymph nodes. Thyroid gland, trachea, and esophagus demonstrate no significant findings. Lungs/Pleura: No pneumothorax is noted. No significant pleural effusion is noted. Mild bilateral posterior basilar subsegmental atelectasis is noted. Musculoskeletal: No chest wall abnormality. No acute or significant osseous findings. Review of the MIP images confirms the above findings. CT ABDOMEN and PELVIS FINDINGS Hepatobiliary: Status post cholecystectomy 2 days ago. No significant fluid collection is noted in the gallbladder fossa. No biliary dilatation is noted. The liver is otherwise unremarkable. Mild pneumoperitoneum is noted in epigastric region which most likely is postoperative in etiology. Pancreas: Stable pancreatic head calcifications are noted suggesting chronic pancreatitis. No acute inflammation or ductal dilatation is noted. Spleen: Normal in size without focal abnormality. Adrenals/Urinary Tract: Adrenal glands appear normal. Right renal cyst is noted. No hydronephrosis or renal obstruction is noted. Urinary bladder is unremarkable. Stomach/Bowel: The stomach is unremarkable. No small bowel dilatation is noted. Dilatation of the right and transverse colon is noted which most likely represents postoperative ileus. The appendix is not visualized. Vascular/Lymphatic: Aortic atherosclerosis. No enlarged abdominal or pelvic lymph nodes. Reproductive: Stable mild prostatic enlargement is noted. Other: No abdominal wall hernia or abnormality. No abdominopelvic ascites. Musculoskeletal: Multilevel degenerative disc disease is  noted in the lower lumbar spine. No acute osseous abnormality is noted. Review of the MIP images confirms the above findings. IMPRESSION: No definite evidence of pulmonary embolus. Coronary artery calcifications are noted. Mild bilateral posterior basilar subsegmental atelectasis is noted. Status post cholecystectomy 2 days ago. No significant fluid collection is noted in the gallbladder fossa. Mild pneumoperitoneum is noted in the epigastric region which most likely is postoperative in etiology. Aortic Atherosclerosis (ICD10-I70.0). Electronically Signed   By: Marijo Conception M.D.   On: 03/25/2019 14:33   Dg Chest Port 1 View  Result Date: 03/25/2019 CLINICAL DATA:  Altered mental status EXAM: PORTABLE CHEST 1 VIEW COMPARISON:  October 29, 2018 FINDINGS: The degree of inspiration is shallow. There is no evident edema or consolidation. There is apparent atelectasis in the medial left base. Heart size and pulmonary vascularity are within normal limits. No adenopathy. There is  aortic atherosclerosis. There is also calcification in the left carotid artery. There is degenerative change in each shoulder. Apparent postoperative change lateral right clavicle, stable. IMPRESSION: Atelectasis medial left base. No edema or consolidation. Heart size within normal limits. There is arthropathy in each shoulder with apparent postoperative change lateral right clavicle. Aortic Atherosclerosis (ICD10-I70.0). There is left carotid artery calcification. Electronically Signed   By: Lowella Grip III M.D.   On: 03/25/2019 10:35    EKG: Independently reviewed.  Sinus rhythm, rate 67  Assessment/Plan  Acute metabolic encephalopathy -Secondary to hypoxia versus recent surgery and anesthesia versus medications -Continue to monitor -CT head showed no acute intracranial pathology -Not sure of patient's baseline however per discharge summary done on 03/24/2019, patient noted to have some mild memory loss and confusion but  able to be oriented.  Hypoxia -Noted to have an SPO2 of 88% on admission -Continue supplemental oxygen to maintain saturations above 92% -Continue incentive spirometry -Chest x-ray unremarkable for infection, although some atelectasis on the left -CTA chest unremarkable for PE -Possibly secondary to excess volume received during recent hospitalization and surgery  Elevated troponin -Patient currently denies chest pain -EKG reviewed, no changes from prior EKG -Continue to cycle troponin -Patient with history of CAD, continue, aspirin, statin, metoprolol -Of note, patient did have cardiac catheterization on 11/05/2018 showed nonobstructive CAD.  Plan was to continue medical therapy and obtain echocardiogram as an outpatient however no echocardiogram noted in chart  Elevated BNP -Patient does not overtly appear to be hypervolemic on exam -BNP 96.3 -Echocardiogram ordered -Monitor intake and output, daily weights -No history of CHF -One dose of IV lasix ordered  SIRS -Patient with fever of 100.6, leukocytosis -UA and chest x-ray unremarkable for infection -CT and pelvis showed no significant fluid collection in the gallbladder fossa.  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  Disposition Plan: Home   Consults called: General surgery   Admission status: observation    Time spent: 70 minutes  Joban Colledge D.O. [facility]  Between 7am to 7pm  - Please see pager noted on [email or web address]  After 7pm go to [email or web address] 03/25/2019, 4:14 PM

## 2019-03-25 NOTE — ED Triage Notes (Signed)
Pt arrived to ED via EMS after family reports confusion over the last couple of days. Per EMS pt able to answer all orientation questions. EMS VS 144/86 HR 72 SPO2 93 on 3L O2 via [place], CBG 134, TEMP 97.7 F, RR 18. Pt did have gallbladder surgery on Weds and c/o of intermittent pain at incision site. EMS unaware of whether pt currently taking any medications post surgery.

## 2019-03-25 NOTE — ED Notes (Signed)
Rectal temp 100.7 F

## 2019-03-25 NOTE — ED Notes (Addendum)
ED TO INPATIENT HANDOFF REPORT  ED Nurse Name and Phone #: Garnette Czech [phone]  S Name/Age/Gender Johnny [name] 77 y.o. male Room/Bed: 015C/015C  Code Status   Code Status: Prior  Home/SNF/Other Home Patient oriented to: self and place Is this baseline? No   Triage Complete: Triage complete  Chief Complaint confused  Triage Note Pt arrived to ED via EMS after family reports confusion over the last couple of days. Per EMS pt able to answer all orientation questions. EMS VS 144/86 HR 72 SPO2 93 on 3L O2 via [place], CBG 134, TEMP 97.7 F, RR 18. Pt did have gallbladder surgery on Weds and c/o of intermittent pain at incision site. EMS unaware of whether pt currently taking any medications post surgery.    Allergies No Known Allergies  Level of Care/Admitting Diagnosis ED Disposition    ED Disposition Condition Comment   Admit  Hospital Area: [facility] [[id number]]  Level of Care: Telemetry Cardiac [103]  I expect the patient will be discharged within 24 hours: No (not a candidate for 5C-Observation unit)  Covid Evaluation: Confirmed COVID Negative  Diagnosis: Acute metabolic encephalopathy [[id number]]  Admitting Physician: Cristal Ford [[id number]]  Attending Physician: Cristal Ford [phone]  PT Class (Do Not Modify): Observation [104]  PT Acc Code (Do Not Modify): Observation [10022]       B Medical/Surgery History Past Medical History:  Diagnosis Date  . Acromioclavicular joint arthritis   . Anemia   . B12 deficiency 10/15/11   "take injections q 28 days"  . Coronary artery disease    LHC 10/15/11: LAD stent patent with mid ectasia, distal LAD 50-60%, mid circumflex with ectasia, EF 55-65%.  . Dyslipidemia   . GERD (gastroesophageal reflux disease)   . Glucose intolerance (pre-diabetes)    Borderline glucose intolerance  . Hypertension   . Hypothyroidism   . Post concussive syndrome 2011   "for ~ 6-8 months"  . Rotator cuff tear   .  Shortness of breath 10/15/11   "here lately I've been having it on & off any time"  . SVT (supraventricular tachycardia) ([place])    maintained on beta blocker   Past Surgical History:  Procedure Laterality Date  . BACK SURGERY    . CARDIAC CATHETERIZATION  12/19/08   NORMAL. EF 55%  . CARDIAC CATHETERIZATION  10/15/11  . CARDIOVASCULAR STRESS TEST  10/2010  . CARPAL TUNNEL RELEASE     bilaterally  . CHOLECYSTECTOMY  03/23/2019  . COLONOSCOPY W/ ENDOSCOPIC [place]    . CORONARY ANGIOPLASTY WITH STENT PLACEMENT  9/ 2005   . JOINT REPLACEMENT    . LAPAROSCOPIC CHOLECYSTECTOMY SINGLE SITE WITH INTRAOPERATIVE CHOLANGIOGRAM N/A 03/23/2019   Procedure: SINGLE SITE LAPAROSCOPIC CHOLECYSTECTOMY WITH INTRAOPERATIVE CHOLANGIOGRAM;  Surgeon: Michael Boston, MD;  Location: [facility];  Service: General;  Laterality: N/A;  . LEFT HEART CATH AND CORONARY ANGIOGRAPHY N/A 11/05/2018   Procedure: LEFT HEART CATH AND CORONARY ANGIOGRAPHY;  Surgeon: Martinique, Peter M, MD;  Location: [facility];  Service: Cardiovascular;  Laterality: N/A;  . LEFT HEART CATHETERIZATION WITH CORONARY ANGIOGRAM N/A 10/15/2011   Procedure: LEFT HEART CATHETERIZATION WITH CORONARY ANGIOGRAM;  Surgeon: Sherren Mocha, MD;  Location: [facility] CATH LAB;  Service: Cardiovascular;  Laterality: N/A;  . LUMBAR LAMINECTOMY  1970's  . NASAL SINUS SURGERY     x2  . reconstructive shoulder surgery     right  . REPLACEMENT TOTAL KNEE  12/2007   left  . RESECTION TUMOR CLAVICLE RADICAL  Open distal clavicle resection  . SHOULDER SURGERY  02/11/2011   right  . TRIGGER FINGER RELEASE     ? both hands     A IV Location/Drains/Wounds Patient Lines/Drains/Airways Status   Active Line/Drains/Airways    Name:   Placement date:   Placement time:   Site:   Days:   Peripheral IV 03/25/19 Left Antecubital   03/25/19    0915    Antecubital   less than 1   External Urinary Catheter   03/25/19    1035    -   less than 1   Incision (Closed) 03/23/19  Abdomen Other (Comment)   03/23/19    1803     2   Incision - 2 Ports Abdomen 1: Umbilicus 2: Right;Lateral;Umbilicus   09/60/45    4098     2          Intake/Output Last 24 hours No intake or output data in the 24 hours ending 03/25/19 1602  Labs/Imaging Results for orders placed or performed during the hospital encounter of 03/25/19 (from the past 48 hour(s))  Lactic acid, plasma     Status: None   Collection Time: 03/25/19 10:02 AM  Result Value Ref Range   Lactic Acid, Venous 1.6 0.5 - 1.9 mmol/L    Comment: Performed at [facility], [address] [place], [place] [address]  Urinalysis, Routine w reflex microscopic     Status: Abnormal   Collection Time: 03/25/19 10:02 AM  Result Value Ref Range   Color, Urine YELLOW YELLOW   APPearance CLEAR CLEAR   Specific Gravity, Urine 1.012 1.005 - 1.030   pH 5.0 5.0 - 8.0   Glucose, UA NEGATIVE NEGATIVE mg/dL   Hgb urine dipstick SMALL (A) NEGATIVE   Bilirubin Urine NEGATIVE NEGATIVE   Ketones, ur NEGATIVE NEGATIVE mg/dL   Protein, ur NEGATIVE NEGATIVE mg/dL   Nitrite NEGATIVE NEGATIVE   Leukocytes,Ua NEGATIVE NEGATIVE   RBC / HPF 0-5 0 - 5 RBC/hpf   WBC, UA 0-5 0 - 5 WBC/hpf   Bacteria, UA NONE SEEN NONE SEEN   Mucus PRESENT     Comment: Performed at [facility] [address] [place], [place] [address]  Urine rapid drug screen (hosp performed)     Status: None   Collection Time: 03/25/19 10:02 AM  Result Value Ref Range   Opiates NONE DETECTED NONE DETECTED   Cocaine NONE DETECTED NONE DETECTED   Benzodiazepines NONE DETECTED NONE DETECTED   Amphetamines NONE DETECTED NONE DETECTED   Tetrahydrocannabinol NONE DETECTED NONE DETECTED   Barbiturates NONE DETECTED NONE DETECTED    Comment: (NOTE) DRUG SCREEN FOR MEDICAL PURPOSES ONLY.  IF CONFIRMATION IS NEEDED FOR ANY PURPOSE, NOTIFY LAB WITHIN 5 DAYS. LOWEST DETECTABLE LIMITS FOR URINE DRUG SCREEN Drug Class                     Cutoff  (ng/mL) Amphetamine and metabolites    1000 Barbiturate and metabolites    200 Benzodiazepine                 621 Tricyclics and metabolites     300 Opiates and metabolites        300 Cocaine and metabolites        300 THC                            50 Performed at [facility], [place] [address] [place], [place] [address]   SARS Coronavirus 2 (CEPHEID- Performed in [facility] hospital lab), Hosp Order     Status: None   Collection Time: 03/25/19 10:03 AM  Result Value Ref Range   SARS Coronavirus 2 NEGATIVE NEGATIVE    Comment: (NOTE) If result is NEGATIVE SARS-CoV-2 target nucleic acids are NOT DETECTED. The SARS-CoV-2 RNA is generally detectable in upper and lower  respiratory specimens during the acute phase of infection. The lowest  concentration of SARS-CoV-2 viral copies this assay can detect is 250  copies / mL. A negative result does not preclude SARS-CoV-2 infection  and should not be used as the sole basis for treatment or other  patient management decisions.  A negative result Winne occur with  improper specimen collection / handling, submission of specimen other  than nasopharyngeal swab, presence of viral mutation(s) within the  areas targeted by this assay, and inadequate number of viral copies  (<250 copies / mL). A negative result must be combined with clinical  observations, patient history, and epidemiological information. If result is POSITIVE SARS-CoV-2 target nucleic acids are DETECTED. The SARS-CoV-2 RNA is generally detectable in upper and lower  respiratory specimens dur ing the acute phase of infection.  Positive  results are indicative of active infection with SARS-CoV-2.  Clinical  correlation with patient history and other diagnostic information is  necessary to determine patient infection status.  Positive results do  not rule out bacterial infection or co-infection with other viruses. If result is PRESUMPTIVE POSTIVE SARS-CoV-2 nucleic acids  Greb BE PRESENT.   A presumptive positive result was obtained on the submitted specimen  and confirmed on repeat testing.  While 2019 novel coronavirus  (SARS-CoV-2) nucleic acids Mcconnell be present in the submitted sample  additional confirmatory testing Klinedinst be necessary for epidemiological  and / or clinical management purposes  to differentiate between  SARS-CoV-2 and other Sarbecovirus currently known to infect humans.  If clinically indicated additional testing with an alternate test  methodology [phone]) is advised. The SARS-CoV-2 RNA is generally  detectable in upper and lower respiratory sp ecimens during the acute  phase of infection. The expected result is Negative. Fact Sheet for Patients:  StrictlyIdeas.no Fact Sheet for Healthcare Providers: BankingDealers.co.za This test is not yet approved or cleared by the [place] FDA and has been authorized for detection and/or diagnosis of SARS-CoV-2 by FDA under an Emergency Use Authorization (EUA).  This EUA will remain in effect (meaning this test can be used) for the duration of the COVID-19 declaration under Section 564(b)(1) of the Act, 21 U.S.C. section 360bbb-3(b)(1), unless the authorization is terminated or revoked sooner. Performed at [facility], [place] [address], [place], [place] [address]   Ammonia     Status: None   Collection Time: 03/25/19 10:36 AM  Result Value Ref Range   Ammonia 10 9 - 35 umol/L    Comment: Performed at [facility], [place] [address] [place], [place] [address]  Comprehensive metabolic panel     Status: Abnormal   Collection Time: 03/25/19 10:53 AM  Result Value Ref Range   Sodium 138 135 - 145 mmol/L   Potassium 3.7 3.5 - 5.1 mmol/L   Chloride 102 98 - 111 mmol/L   CO2 23 22 - 32 mmol/L   Glucose, Bld 102 (H) 70 - 99 mg/dL   BUN 19 8 - 23 mg/dL   Creatinine, Ser 1.35 (H) 0.61 - 1.24  mg/dL   Calcium 9.1 8.9 - 10.3 mg/dL   Total  Protein 6.2 (L) 6.5 - 8.1 g/dL   Albumin 3.5 3.5 - 5.0 g/dL   AST 47 (H) 15 - 41 U/L   ALT 36 0 - 44 U/L   Alkaline Phosphatase 45 38 - 126 U/L   Total Bilirubin 1.0 0.3 - 1.2 mg/dL   GFR calc non Af Amer 50 (L) >60 mL/min   GFR calc Af Amer 57 (L) >60 mL/min   Anion gap 13 5 - 15    Comment: Performed at [facility] [address] [place], [place] [address]  Troponin I - Once     Status: Abnormal   Collection Time: 03/25/19 10:53 AM  Result Value Ref Range   Troponin I 0.19 (HH) <0.03 ng/mL    Comment: CRITICAL RESULT CALLED TO, READ BACK BY AND VERIFIED WITH: Johnsie Cancel 1159 03/25/2019 CLARK,S Performed at [facility], [place] [address], [place], [place] [address] CORRECTED ON 05/29 AT 1209: PREVIOUSLY REPORTED AS 0.19 CRITICAL RESULT CALLED TO, READ BACK BY AND VERIFIED WITH: L.Golz,RN 1159 03/25/2019 CLARK,S   CBC with Differential     Status: Abnormal   Collection Time: 03/25/19 10:53 AM  Result Value Ref Range   WBC 16.1 (H) 4.0 - 10.5 K/uL   RBC 4.65 4.22 - 5.81 MIL/uL   Hemoglobin 14.3 13.0 - 17.0 g/dL   HCT 44.1 39.0 - 52.0 %   MCV 94.8 80.0 - 100.0 fL   MCH 30.8 26.0 - 34.0 pg   MCHC 32.4 30.0 - 36.0 g/dL   RDW 12.9 11.5 - 15.5 %   Platelets 219 150 - 400 K/uL   nRBC 0.0 0.0 - 0.2 %   Neutrophils Relative % 84 %   Neutro Abs 13.5 (H) 1.7 - 7.7 K/uL   Lymphocytes Relative 6 %   Lymphs Abs 1.0 0.7 - 4.0 K/uL   Monocytes Relative 9 %   Monocytes Absolute 1.5 (H) 0.1 - 1.0 K/uL   Eosinophils Relative 0 %   Eosinophils Absolute 0.0 0.0 - 0.5 K/uL   Basophils Relative 0 %   Basophils Absolute 0.0 0.0 - 0.1 K/uL   Immature Granulocytes 1 %   Abs Immature Granulocytes 0.10 (H) 0.00 - 0.07 K/uL    Comment: Performed at [facility], [address], [place], [place] [address]  Protime-INR     Status: None   Collection Time: 03/25/19 10:53 AM  Result Value Ref Range   Prothrombin Time 14.4 11.4 - 15.2 seconds   INR 1.1 0.8 - 1.2    Comment:  (NOTE) INR goal varies based on device and disease states. Performed at [facility], [place] [address] [place], [place] [address]   Brain natriuretic peptide     Status: Abnormal   Collection Time: 03/25/19 10:55 AM  Result Value Ref Range   B Natriuretic Peptide 196.3 (H) 0.0 - 100.0 pg/mL    Comment: Performed at [facility] [address], [place], [place] [address]  Lactic acid, plasma     Status: Abnormal   Collection Time: 03/25/19 12:20 PM  Result Value Ref Range   Lactic Acid, Venous 2.0 (HH) 0.5 - 1.9 mmol/L    Comment: CRITICAL RESULT CALLED TO, READ BACK BY AND VERIFIED WITH: K MOON RN AT 3244 ON [id number] BY Marcos Eke Performed at [facility], [place] [address], [place], [place] [address]    Dg  Cholangiogram Operative  Result Date: 03/24/2019 CLINICAL DATA:  Gallstones EXAM: INTRAOPERATIVE CHOLANGIOGRAM TECHNIQUE: Cholangiographic images from the C-arm fluoroscopic device were submitted for interpretation post-operatively. Please see the procedural report for the amount of contrast and the fluoroscopy time utilized. COMPARISON:  None. FINDINGS: Contrast fills the biliary tree and duodenum without filling defects in the common bile duct. IMPRESSION: Patent biliary tree. Electronically Signed   By: Marybelle Killings M.D.   On: 03/24/2019 07:25   Ct Head Wo Contrast  Result Date: 03/25/2019 CLINICAL DATA:  Confusion over the last couple of days EXAM: CT HEAD WITHOUT CONTRAST TECHNIQUE: Contiguous axial images were obtained from the base of the skull through the vertex without intravenous contrast. COMPARISON:  None. FINDINGS: Brain: No evidence of acute infarction, hemorrhage, extra-axial collection, ventriculomegaly, or mass effect. Generalized cerebral atrophy. Periventricular white matter low attenuation likely secondary to microangiopathy. Vascular: Cerebrovascular atherosclerotic calcifications are noted. Skull: Negative for fracture or focal lesion. Sinuses/Orbits:  Visualized portions of the orbits are unremarkable. Mastoid sinuses are clear. Mild left maxillary sinus mucosal thickening. Prior sinus surgery. Other: None. IMPRESSION: No acute intracranial pathology. Electronically Signed   By: Kathreen Devoid   On: 03/25/2019 11:11   Ct Angio Chest Pe W/cm &/or Wo Cm  Result Date: 03/25/2019 CLINICAL DATA:  Elevated D-dimer level. Right upper quadrant pain status post cholecystectomy. EXAM: CT ANGIOGRAPHY CHEST CT ABDOMEN AND PELVIS WITH CONTRAST TECHNIQUE: Multidetector CT imaging of the chest was performed using the standard protocol during bolus administration of intravenous contrast. Multiplanar CT image reconstructions and MIPs were obtained to evaluate the vascular anatomy. Multidetector CT imaging of the abdomen and pelvis was performed using the standard protocol during bolus administration of intravenous contrast. CONTRAST:  118mL OMNIPAQUE IOHEXOL 350 MG/ML SOLN COMPARISON:  CT scan of January 20, 2019. FINDINGS: CTA CHEST FINDINGS Cardiovascular: Satisfactory opacification of the pulmonary arteries to the segmental level. No evidence of pulmonary embolism. Normal heart size. No pericardial effusion. Coronary artery calcifications are noted. Mediastinum/Nodes: No enlarged mediastinal, hilar, or axillary lymph nodes. Thyroid gland, trachea, and esophagus demonstrate no significant findings. Lungs/Pleura: No pneumothorax is noted. No significant pleural effusion is noted. Mild bilateral posterior basilar subsegmental atelectasis is noted. Musculoskeletal: No chest wall abnormality. No acute or significant osseous findings. Review of the MIP images confirms the above findings. CT ABDOMEN and PELVIS FINDINGS Hepatobiliary: Status post cholecystectomy 2 days ago. No significant fluid collection is noted in the gallbladder fossa. No biliary dilatation is noted. The liver is otherwise unremarkable. Mild pneumoperitoneum is noted in epigastric region which most likely is  postoperative in etiology. Pancreas: Stable pancreatic head calcifications are noted suggesting chronic pancreatitis. No acute inflammation or ductal dilatation is noted. Spleen: Normal in size without focal abnormality. Adrenals/Urinary Tract: Adrenal glands appear normal. Right renal cyst is noted. No hydronephrosis or renal obstruction is noted. Urinary bladder is unremarkable. Stomach/Bowel: The stomach is unremarkable. No small bowel dilatation is noted. Dilatation of the right and transverse colon is noted which most likely represents postoperative ileus. The appendix is not visualized. Vascular/Lymphatic: Aortic atherosclerosis. No enlarged abdominal or pelvic lymph nodes. Reproductive: Stable mild prostatic enlargement is noted. Other: No abdominal wall hernia or abnormality. No abdominopelvic ascites. Musculoskeletal: Multilevel degenerative disc disease is noted in the lower lumbar spine. No acute osseous abnormality is noted. Review of the MIP images confirms the above findings. IMPRESSION: No definite evidence of pulmonary embolus. Coronary artery calcifications are noted. Mild bilateral posterior basilar subsegmental atelectasis is noted. Status post cholecystectomy  2 days ago. No significant fluid collection is noted in the gallbladder fossa. Mild pneumoperitoneum is noted in the epigastric region which most likely is postoperative in etiology. Aortic Atherosclerosis (ICD10-I70.0). Electronically Signed   By: Marijo Conception M.D.   On: 03/25/2019 14:33   Ct Abdomen Pelvis W Contrast  Result Date: 03/25/2019 CLINICAL DATA:  Elevated D-dimer level. Right upper quadrant pain status post cholecystectomy. EXAM: CT ANGIOGRAPHY CHEST CT ABDOMEN AND PELVIS WITH CONTRAST TECHNIQUE: Multidetector CT imaging of the chest was performed using the standard protocol during bolus administration of intravenous contrast. Multiplanar CT image reconstructions and MIPs were obtained to evaluate the vascular anatomy.  Multidetector CT imaging of the abdomen and pelvis was performed using the standard protocol during bolus administration of intravenous contrast. CONTRAST:  139mL OMNIPAQUE IOHEXOL 350 MG/ML SOLN COMPARISON:  CT scan of January 20, 2019. FINDINGS: CTA CHEST FINDINGS Cardiovascular: Satisfactory opacification of the pulmonary arteries to the segmental level. No evidence of pulmonary embolism. Normal heart size. No pericardial effusion. Coronary artery calcifications are noted. Mediastinum/Nodes: No enlarged mediastinal, hilar, or axillary lymph nodes. Thyroid gland, trachea, and esophagus demonstrate no significant findings. Lungs/Pleura: No pneumothorax is noted. No significant pleural effusion is noted. Mild bilateral posterior basilar subsegmental atelectasis is noted. Musculoskeletal: No chest wall abnormality. No acute or significant osseous findings. Review of the MIP images confirms the above findings. CT ABDOMEN and PELVIS FINDINGS Hepatobiliary: Status post cholecystectomy 2 days ago. No significant fluid collection is noted in the gallbladder fossa. No biliary dilatation is noted. The liver is otherwise unremarkable. Mild pneumoperitoneum is noted in epigastric region which most likely is postoperative in etiology. Pancreas: Stable pancreatic head calcifications are noted suggesting chronic pancreatitis. No acute inflammation or ductal dilatation is noted. Spleen: Normal in size without focal abnormality. Adrenals/Urinary Tract: Adrenal glands appear normal. Right renal cyst is noted. No hydronephrosis or renal obstruction is noted. Urinary bladder is unremarkable. Stomach/Bowel: The stomach is unremarkable. No small bowel dilatation is noted. Dilatation of the right and transverse colon is noted which most likely represents postoperative ileus. The appendix is not visualized. Vascular/Lymphatic: Aortic atherosclerosis. No enlarged abdominal or pelvic lymph nodes. Reproductive: Stable mild prostatic  enlargement is noted. Other: No abdominal wall hernia or abnormality. No abdominopelvic ascites. Musculoskeletal: Multilevel degenerative disc disease is noted in the lower lumbar spine. No acute osseous abnormality is noted. Review of the MIP images confirms the above findings. IMPRESSION: No definite evidence of pulmonary embolus. Coronary artery calcifications are noted. Mild bilateral posterior basilar subsegmental atelectasis is noted. Status post cholecystectomy 2 days ago. No significant fluid collection is noted in the gallbladder fossa. Mild pneumoperitoneum is noted in the epigastric region which most likely is postoperative in etiology. Aortic Atherosclerosis (ICD10-I70.0). Electronically Signed   By: Marijo Conception M.D.   On: 03/25/2019 14:33   Dg Chest Port 1 View  Result Date: 03/25/2019 CLINICAL DATA:  Altered mental status EXAM: PORTABLE CHEST 1 VIEW COMPARISON:  October 29, 2018 FINDINGS: The degree of inspiration is shallow. There is no evident edema or consolidation. There is apparent atelectasis in the medial left base. Heart size and pulmonary vascularity are within normal limits. No adenopathy. There is aortic atherosclerosis. There is also calcification in the left carotid artery. There is degenerative change in each shoulder. Apparent postoperative change lateral right clavicle, stable. IMPRESSION: Atelectasis medial left base. No edema or consolidation. Heart size within normal limits. There is arthropathy in each shoulder with apparent postoperative change lateral right clavicle.  Aortic Atherosclerosis (ICD10-I70.0). There is left carotid artery calcification. Electronically Signed   By: Lowella Grip III M.D.   On: 03/25/2019 10:35    Pending Labs Unresulted Labs (From admission, onward)    Start     Ordered   03/25/19 1002  Culture, blood (routine x 2)  BLOOD CULTURE X 2,   STAT     03/25/19 1002   Signed and Held  CBC  (enoxaparin (LOVENOX)    CrCl >/= 30 ml/min)  Once,    R    Comments:  Baseline for enoxaparin therapy IF NOT ALREADY DRAWN.  Notify MD if PLT < 100 K.    Signed and Held   Signed and Held  Creatinine, serum  (enoxaparin (LOVENOX)    CrCl >/= 30 ml/min)  Once,   R    Comments:  Baseline for enoxaparin therapy IF NOT ALREADY DRAWN.    Signed and Held   Signed and Held  Creatinine, serum  (enoxaparin (LOVENOX)    CrCl >/= 30 ml/min)  Weekly,   R    Comments:  while on enoxaparin therapy    Signed and Held   Signed and Held  Basic metabolic panel  Tomorrow morning,   R     Signed and Held   Signed and Held  CBC  Tomorrow morning,   R     Signed and Held          Vitals/Pain Today's Vitals   03/25/19 1145 03/25/19 1245 03/25/19 1255 03/25/19 1300  BP: (!) 161/88 (!) 146/82  (!) 154/73  Pulse: 68  70   Resp:      Temp:      TempSrc:      SpO2: 95%  93%   Weight:      Height:      PainSc:        Isolation Precautions Droplet and Contact precautions  Medications Medications  0.9 %  sodium chloride infusion ( Intravenous New Bag/Given 03/25/19 1230)  iohexol (OMNIPAQUE) 350 MG/ML injection 100 mL (100 mLs Intravenous Contrast Given 03/25/19 1407)    Mobility walks with device High fall risk   Focused Assessments Cardiac Assessment Handoff:  Cardiac Rhythm: Normal sinus rhythm Lab Results  Component Value Date   CKTOTAL 110 10/16/2011   CKMB 3.3 10/16/2011   TROPONINI 0.19 (HH) 03/25/2019   Lab Results  Component Value Date   DDIMER 0.56 (H) 10/29/2018   Does the Patient currently have chest pain? No  , Neuro Assessment Handoff:  Swallow screen pass? Yes  Cardiac Rhythm: Normal sinus rhythm   Last date known well: 03/24/19   Neuro Assessment: Within Defined Limits Neuro Checks:      Last Documented NIHSS Modified Score:   Has TPA been given? No If patient is a Neuro Trauma and patient is going to OR before floor call report to [place] nurse: [phone] or [phone]     R Recommendations: See  Admitting Provider Note  Report given to:   Additional Notes:   Acute metabolic encephalopathy -Secondary to hypoxia versus recent surgery and anesthesia versus medications -Continue to monitor -CT head showed no acute intracranial pathology -Not sure of patient's baseline however per discharge summary done on 03/24/2019, patient noted to have some mild memory loss and confusion but able to be oriented.  Hypoxia -Noted to have an SPO2 of 88% on admission -Continue supplemental oxygen to maintain saturations above 92% -Continue incentive spirometry -Chest x-ray unremarkable for infection, although some atelectasis  on the left -CTA chest unremarkable for PE -Possibly secondary to excess volume received during recent hospitalization and surgery  Elevated troponin -Patient currently denies chest pain -EKG reviewed, no changes from prior EKG -Continue to cycle troponin -Patient with history of CAD, continue, aspirin, statin, metoprolol -Of note, patient did have cardiac catheterization on 11/05/2018 showed nonobstructive CAD.  Plan was to continue medical therapy and obtain echocardiogram as an outpatient however no echocardiogram noted in chart  Elevated BNP -Patient does not overtly appear to be hypervolemic on exam -BNP 96.3 -Echocardiogram ordered -Monitor intake and output, daily weights -No history of CHF -One dose of IV lasix ordered  SIRS -Patient with fever of 100.6, leukocytosis -UA and chest x-ray unremarkable for infection -CT and pelvis showed no significant fluid collection in the gallbladder fossa.  Mild pneumoperitoneum in the epigastric region likely postoperative in etiology -Hold off antibiotics at this time -Surgery consulted and appreciated  AKI versus CKD, stage III -Creatinine currently 1.35, although has been in stage III since January 2020 -Continue to monitor BMP  Recent laparoscopic cholecystectomy -Above, general surgery consulted and  appreciated  Depression -Continue Cymbalta  GERD -Continue PPI  Essential hypertension -Hold lisinopril -Continue metoprolol  DVT prophylaxis:   Code Status: Full  Family Communication: None at bedside.  Disposition Plan: Home   Consults called: General surgery   Admission status: observation

## 2019-03-25 NOTE — ED Notes (Signed)
Pt SPO2 88% on RA, placed on 3L O2 via [place] and SPO2 up to 95%

## 2019-03-25 NOTE — Progress Notes (Addendum)
[facility] Progress Note     Subjective: CC: confusion and SOB  Patient presented to the ED with confusion. CT head negative for acute process. Patient underwent laparoscopic cholecystectomy 5/27 and was discharged 5/28. Was doing well until around 3:00 AM when he became more confused about where he was and what surgery he had. CTA negative for PE and CT abdomen/pelvis unremarkable. Patient was able to tell me he had his gallbladder removed and that he has some generalized abdominal pain. Reported he has felt more short of breath with doing anything. Patient then told myself and Dr. Ree Kida that he was at the station and he needed to get up and get his pants.   Review of Systems  Unable to perform ROS: Mental acuity (patient confused but able to tell me he has some generalized abdominal pain and that he has been more SOB)    Objective: Vital signs in last 24 hours: Temp:  [98.2 F (36.8 C)-100.7 F (38.2 C)] 100.7 F (38.2 C) (05/29 1044) Pulse Rate:  [67-70] 70 (05/29 1255) Resp:  [14-21] 18 (05/29 0945) BP: (140-163)/(73-90) 154/73 (05/29 1300) SpO2:  [88 %-97 %] 93 % (05/29 1255) Weight:  [86.6 kg] 86.6 kg (05/29 0912)    Intake/Output from previous day: No intake/output data recorded. Intake/Output this shift: No intake/output data recorded.  Physical Exam  Constitutional: He appears not malnourished and not jaundiced.  Non-toxic appearance.  HENT:  Head: Normocephalic and atraumatic.  Nose: Nose normal.  Mouth/Throat: Oropharynx is clear and moist and mucous membranes are normal.  Eyes: Lids are normal. No scleral icterus.  Cardiovascular: Normal rate and regular rhythm.  Pulmonary/Chest: Tachypnea (mild) noted.  Abdominal: Soft. He exhibits no distension. There is no hepatosplenomegaly. There is generalized abdominal tenderness (mild). There is no rigidity, no rebound and no guarding.  Genitourinary:    Genitourinary Comments: Condom catheter present    Musculoskeletal:     Comments: ROM grossly intact in all 4 extremities  Neurological: He is alert. He is disoriented.  Skin: Skin is warm and dry. No rash noted. He is not diaphoretic.    Lab Results:  Recent Labs    03/25/19 1053  WBC 16.1*  HGB 14.3  HCT 44.1  PLT 219   BMET Recent Labs    03/25/19 1053  NA 138  K 3.7  CL 102  CO2 23  GLUCOSE 102*  BUN 19  CREATININE 1.35*  CALCIUM 9.1   PT/INR Recent Labs    03/25/19 1053  LABPROT 14.4  INR 1.1   CMP     Component Value Date/Time   NA 138 03/25/2019 1053   NA 142 11/03/2018 1626   K 3.7 03/25/2019 1053   CL 102 03/25/2019 1053   CO2 23 03/25/2019 1053   GLUCOSE 102 (H) 03/25/2019 1053   BUN 19 03/25/2019 1053   BUN 25 11/03/2018 1626   CREATININE 1.35 (H) 03/25/2019 1053   CALCIUM 9.1 03/25/2019 1053   PROT 6.2 (L) 03/25/2019 1053   ALBUMIN 3.5 03/25/2019 1053   AST 47 (H) 03/25/2019 1053   ALT 36 03/25/2019 1053   ALKPHOS 45 03/25/2019 1053   BILITOT 1.0 03/25/2019 1053   GFRNONAA 50 (L) 03/25/2019 1053   GFRAA 57 (L) 03/25/2019 1053   Lipase     Component Value Date/Time   LIPASE 18 01/20/2019 1728       Studies/Results: Dg Cholangiogram Operative  Result Date: 03/24/2019 CLINICAL DATA:  Gallstones EXAM: INTRAOPERATIVE CHOLANGIOGRAM TECHNIQUE: Cholangiographic images  from the C-arm fluoroscopic device were submitted for interpretation post-operatively. Please see the procedural report for the amount of contrast and the fluoroscopy time utilized. COMPARISON:  None. FINDINGS: Contrast fills the biliary tree and duodenum without filling defects in the common bile duct. IMPRESSION: Patent biliary tree. Electronically Signed   By: Marybelle Killings M.D.   On: 03/24/2019 07:25   Ct Head Wo Contrast  Result Date: 03/25/2019 CLINICAL DATA:  Confusion over the last couple of days EXAM: CT HEAD WITHOUT CONTRAST TECHNIQUE: Contiguous axial images were obtained from the base of the skull through the  vertex without intravenous contrast. COMPARISON:  None. FINDINGS: Brain: No evidence of acute infarction, hemorrhage, extra-axial collection, ventriculomegaly, or mass effect. Generalized cerebral atrophy. Periventricular white matter low attenuation likely secondary to microangiopathy. Vascular: Cerebrovascular atherosclerotic calcifications are noted. Skull: Negative for fracture or focal lesion. Sinuses/Orbits: Visualized portions of the orbits are unremarkable. Mastoid sinuses are clear. Mild left maxillary sinus mucosal thickening. Prior sinus surgery. Other: None. IMPRESSION: No acute intracranial pathology. Electronically Signed   By: Kathreen Devoid   On: 03/25/2019 11:11   Ct Angio Chest Pe W/cm &/or Wo Cm  Result Date: 03/25/2019 CLINICAL DATA:  Elevated D-dimer level. Right upper quadrant pain status post cholecystectomy. EXAM: CT ANGIOGRAPHY CHEST CT ABDOMEN AND PELVIS WITH CONTRAST TECHNIQUE: Multidetector CT imaging of the chest was performed using the standard protocol during bolus administration of intravenous contrast. Multiplanar CT image reconstructions and MIPs were obtained to evaluate the vascular anatomy. Multidetector CT imaging of the abdomen and pelvis was performed using the standard protocol during bolus administration of intravenous contrast. CONTRAST:  126mL OMNIPAQUE IOHEXOL 350 MG/ML SOLN COMPARISON:  CT scan of January 20, 2019. FINDINGS: CTA CHEST FINDINGS Cardiovascular: Satisfactory opacification of the pulmonary arteries to the segmental level. No evidence of pulmonary embolism. Normal heart size. No pericardial effusion. Coronary artery calcifications are noted. Mediastinum/Nodes: No enlarged mediastinal, hilar, or axillary lymph nodes. Thyroid gland, trachea, and esophagus demonstrate no significant findings. Lungs/Pleura: No pneumothorax is noted. No significant pleural effusion is noted. Mild bilateral posterior basilar subsegmental atelectasis is noted. Musculoskeletal: No  chest wall abnormality. No acute or significant osseous findings. Review of the MIP images confirms the above findings. CT ABDOMEN and PELVIS FINDINGS Hepatobiliary: Status post cholecystectomy 2 days ago. No significant fluid collection is noted in the gallbladder fossa. No biliary dilatation is noted. The liver is otherwise unremarkable. Mild pneumoperitoneum is noted in epigastric region which most likely is postoperative in etiology. Pancreas: Stable pancreatic head calcifications are noted suggesting chronic pancreatitis. No acute inflammation or ductal dilatation is noted. Spleen: Normal in size without focal abnormality. Adrenals/Urinary Tract: Adrenal glands appear normal. Right renal cyst is noted. No hydronephrosis or renal obstruction is noted. Urinary bladder is unremarkable. Stomach/Bowel: The stomach is unremarkable. No small bowel dilatation is noted. Dilatation of the right and transverse colon is noted which most likely represents postoperative ileus. The appendix is not visualized. Vascular/Lymphatic: Aortic atherosclerosis. No enlarged abdominal or pelvic lymph nodes. Reproductive: Stable mild prostatic enlargement is noted. Other: No abdominal wall hernia or abnormality. No abdominopelvic ascites. Musculoskeletal: Multilevel degenerative disc disease is noted in the lower lumbar spine. No acute osseous abnormality is noted. Review of the MIP images confirms the above findings. IMPRESSION: No definite evidence of pulmonary embolus. Coronary artery calcifications are noted. Mild bilateral posterior basilar subsegmental atelectasis is noted. Status post cholecystectomy 2 days ago. No significant fluid collection is noted in the gallbladder fossa. Mild pneumoperitoneum is  noted in the epigastric region which most likely is postoperative in etiology. Aortic Atherosclerosis (ICD10-I70.0). Electronically Signed   By: Marijo Conception M.D.   On: 03/25/2019 14:33   Ct Abdomen Pelvis W Contrast  Result  Date: 03/25/2019 CLINICAL DATA:  Elevated D-dimer level. Right upper quadrant pain status post cholecystectomy. EXAM: CT ANGIOGRAPHY CHEST CT ABDOMEN AND PELVIS WITH CONTRAST TECHNIQUE: Multidetector CT imaging of the chest was performed using the standard protocol during bolus administration of intravenous contrast. Multiplanar CT image reconstructions and MIPs were obtained to evaluate the vascular anatomy. Multidetector CT imaging of the abdomen and pelvis was performed using the standard protocol during bolus administration of intravenous contrast. CONTRAST:  134mL OMNIPAQUE IOHEXOL 350 MG/ML SOLN COMPARISON:  CT scan of January 20, 2019. FINDINGS: CTA CHEST FINDINGS Cardiovascular: Satisfactory opacification of the pulmonary arteries to the segmental level. No evidence of pulmonary embolism. Normal heart size. No pericardial effusion. Coronary artery calcifications are noted. Mediastinum/Nodes: No enlarged mediastinal, hilar, or axillary lymph nodes. Thyroid gland, trachea, and esophagus demonstrate no significant findings. Lungs/Pleura: No pneumothorax is noted. No significant pleural effusion is noted. Mild bilateral posterior basilar subsegmental atelectasis is noted. Musculoskeletal: No chest wall abnormality. No acute or significant osseous findings. Review of the MIP images confirms the above findings. CT ABDOMEN and PELVIS FINDINGS Hepatobiliary: Status post cholecystectomy 2 days ago. No significant fluid collection is noted in the gallbladder fossa. No biliary dilatation is noted. The liver is otherwise unremarkable. Mild pneumoperitoneum is noted in epigastric region which most likely is postoperative in etiology. Pancreas: Stable pancreatic head calcifications are noted suggesting chronic pancreatitis. No acute inflammation or ductal dilatation is noted. Spleen: Normal in size without focal abnormality. Adrenals/Urinary Tract: Adrenal glands appear normal. Right renal cyst is noted. No hydronephrosis  or renal obstruction is noted. Urinary bladder is unremarkable. Stomach/Bowel: The stomach is unremarkable. No small bowel dilatation is noted. Dilatation of the right and transverse colon is noted which most likely represents postoperative ileus. The appendix is not visualized. Vascular/Lymphatic: Aortic atherosclerosis. No enlarged abdominal or pelvic lymph nodes. Reproductive: Stable mild prostatic enlargement is noted. Other: No abdominal wall hernia or abnormality. No abdominopelvic ascites. Musculoskeletal: Multilevel degenerative disc disease is noted in the lower lumbar spine. No acute osseous abnormality is noted. Review of the MIP images confirms the above findings. IMPRESSION: No definite evidence of pulmonary embolus. Coronary artery calcifications are noted. Mild bilateral posterior basilar subsegmental atelectasis is noted. Status post cholecystectomy 2 days ago. No significant fluid collection is noted in the gallbladder fossa. Mild pneumoperitoneum is noted in the epigastric region which most likely is postoperative in etiology. Aortic Atherosclerosis (ICD10-I70.0). Electronically Signed   By: Marijo Conception M.D.   On: 03/25/2019 14:33   Dg Chest Port 1 View  Result Date: 03/25/2019 CLINICAL DATA:  Altered mental status EXAM: PORTABLE CHEST 1 VIEW COMPARISON:  October 29, 2018 FINDINGS: The degree of inspiration is shallow. There is no evident edema or consolidation. There is apparent atelectasis in the medial left base. Heart size and pulmonary vascularity are within normal limits. No adenopathy. There is aortic atherosclerosis. There is also calcification in the left carotid artery. There is degenerative change in each shoulder. Apparent postoperative change lateral right clavicle, stable. IMPRESSION: Atelectasis medial left base. No edema or consolidation. Heart size within normal limits. There is arthropathy in each shoulder with apparent postoperative change lateral right clavicle. Aortic  Atherosclerosis (ICD10-I70.0). There is left carotid artery calcification. Electronically Signed   By: Gwyndolyn Saxon  Jasmine December III M.D.   On: 03/25/2019 10:35    Anti-infectives: Anti-infectives (From admission, onward)   None       Assessment/Plan HTN CAD SVT on BB Hypothyroidism GERD  S/p lap cholecystectomy 03/23/19 Dr. Johney Maine - discharged yesterday - LFTs and Tbili ok and surgical site c/d/i - some mild generalized abdominal pain but nothing unexpected post-op - CT abdomen/pelvis unremarkable  Altered mental status - unclear etiology, ?hypoxia vs infectious  - CT head negative for acute pathology - CTA negative for PE ?Hypoxia - fluid overload vs atelectasis, IS Leukocytosis - WBC 16 and patient with Tmax 100.7, UA clean, CXR with atelectasis; possibly just inflammatory post-op Elevation in BNP and troponin - trend labs, discussed with Dr. Ree Kida and agree that he likely needs an echo Lactic acidosis - lactic was 2.0, trend  FEN: ok to have a diet from a surgical perspective VTE: SCDs, ok for chemical VTE from a surgical perspective ID: no abx indicated right now without obvious infectious source  Agree with admission. Presentation could be related to fluid overload post-operatively vs some sort of cardiac exacerbation. I think infection is less likely given labs and imaging. Dr. Johney Maine to follow up and make any further recommendations. No indication for acute operative intervention.    LOS: 0 days    Brigid Re , [facility] 03/25/2019, 3:39 PM Pager: [phone] Consults: [phone]

## 2019-03-25 NOTE — ED Notes (Signed)
Pt refusing to keep SPO2 monitor on, education reinforcement provided.

## 2019-03-25 NOTE — ED Provider Notes (Signed)
[facility] EMERGENCY DEPARTMENT Provider Note   CSN: [id number] Arrival date & time: 03/25/19  0859    History   Chief Complaint Chief Complaint  Patient presents with   Altered Mental Status    HPI Johnny [name] is a 78 y.o. male.     HPI Patient is sent to the emergency department for altered mental status.  Per report, Johnny [name] has noted increased confusion over the past couple of days.  Patient reports he does not think there is anything going on or different.  He thinks that they have overreacted.  Patient is status post cholecystectomy 3 days ago.  He was discharged yesterday.  Per EMS patient was able to answer all questions on arrival and he had stable vital signs with oxygen requirement of 3 L. Past Medical History:  Diagnosis Date   Acromioclavicular joint arthritis    Anemia    B12 deficiency 10/15/11   "take injections q 28 days"   Coronary artery disease    LHC 10/15/11: LAD stent patent with mid ectasia, distal LAD 50-60%, mid circumflex with ectasia, EF 55-65%.   Dyslipidemia    GERD (gastroesophageal reflux disease)    Glucose intolerance (pre-diabetes)    Borderline glucose intolerance   Hypertension    Hypothyroidism    Post concussive syndrome 2011   "for ~ 6-8 months"   Rotator cuff tear    Shortness of breath 10/15/11   "here lately I've been having it on & off any time"   SVT (supraventricular tachycardia) ([place])    maintained on beta blocker    Patient Active Problem List   Diagnosis Date Noted   Acute on chronic cholecystitis s/p lap cholecystectomy 03/23/2019 03/23/2019   Chronic anticoagulation 03/23/2019   Chronic cholecystitis 03/23/2019   Unstable angina ([place]) 11/05/2018   Tachycardia 10/29/2018   Pain in left hip 08/13/2018   Hypothyroidism 11/04/2011   SVT (supraventricular tachycardia) ([place]) 04/10/2011   HTN (hypertension) 04/10/2011   Dyslipidemia 04/10/2011   Coronary atherosclerosis  11/25/2010   GERD 11/25/2010   CONSTIPATION 11/25/2010   CHEST PAIN UNSPECIFIED 11/25/2010   PERSONAL HISTORY OF COLONIC POLYPS 11/25/2010    Past Surgical History:  Procedure Laterality Date   BACK SURGERY     CARDIAC CATHETERIZATION  12/19/08   NORMAL. EF 55%   CARDIAC CATHETERIZATION  10/15/11   CARDIOVASCULAR STRESS TEST  10/2010   CARPAL TUNNEL RELEASE     bilaterally   CHOLECYSTECTOMY  03/23/2019   COLONOSCOPY W/ ENDOSCOPIC [place]     CORONARY ANGIOPLASTY WITH STENT PLACEMENT  9/ 2005    JOINT REPLACEMENT     LAPAROSCOPIC CHOLECYSTECTOMY SINGLE SITE WITH INTRAOPERATIVE CHOLANGIOGRAM N/A 03/23/2019   Procedure: SINGLE SITE LAPAROSCOPIC CHOLECYSTECTOMY WITH INTRAOPERATIVE CHOLANGIOGRAM;  Surgeon: Michael Boston, MD;  Location: [place];  Service: General;  Laterality: N/A;   LEFT HEART CATH AND CORONARY ANGIOGRAPHY N/A 11/05/2018   Procedure: LEFT HEART CATH AND CORONARY ANGIOGRAPHY;  Surgeon: Martinique, Peter M, MD;  Location: [place] CV LAB;  Service: Cardiovascular;  Laterality: N/A;   LEFT HEART CATHETERIZATION WITH CORONARY ANGIOGRAM N/A 10/15/2011   Procedure: LEFT HEART CATHETERIZATION WITH CORONARY ANGIOGRAM;  Surgeon: Sherren Mocha, MD;  Location: [facility] CATH LAB;  Service: Cardiovascular;  Laterality: N/A;   LUMBAR LAMINECTOMY  1970's   NASAL SINUS SURGERY     x2   reconstructive shoulder surgery     right   REPLACEMENT TOTAL KNEE  12/2007   left   RESECTION TUMOR CLAVICLE RADICAL  Open distal clavicle resection   SHOULDER SURGERY  02/11/2011   right   TRIGGER FINGER RELEASE     ? both hands        Home Medications    Prior to Admission medications   Medication Sig Start Date End Date Taking? Authorizing Provider  acetaminophen (TYLENOL) 650 MG CR tablet Take 1,300 mg by mouth 2 (two) times daily.    Yes [provider]  ANORO ELLIPTA 62.5-25 MCG/INH AEPB Take 1 puff by mouth daily as needed (shortness of breath).  01/06/19  Yes  [provider]  aspirin 81 MG tablet Take 81 mg by mouth daily.     Yes [provider]  atorvastatin (LIPITOR) 10 MG tablet Take 1 tablet (10 mg total) by mouth daily. 12/23/18  Yes Martinique, Peter M, MD  Cholecalciferol (VITAMIN D3) 50 MCG (2000 UT) capsule Take 2,000 Units by mouth every evening.   Yes [provider]  clopidogrel (PLAVIX) 75 MG tablet TAKE 1 TABLET BY MOUTH EVERY DAY Patient taking differently: Take 75 mg by mouth daily.  08/16/18  Yes Martinique, Peter M, MD  Cyanocobalamin (VITAMIN B-12 IJ) Inject 1 Dose as directed every 30 (thirty) days.    Yes [provider]  diclofenac sodium (VOLTAREN) 1 % GEL Apply 2 g topically 4 (four) times daily. Patient taking differently: Apply 2 g topically 4 (four) times daily as needed (hip pain).  08/13/18  Yes Aundra Dubin, PA-C  DULoxetine (CYMBALTA) 30 MG capsule Take 30 mg by mouth every evening.   Yes [provider]  Glucos-Chond-Hyal Ac-Ca Fructo (MOVE FREE JOINT HEALTH ADVANCE PO) Take 1 capsule by mouth 2 (two) times daily.   Yes [provider]  hydrOXYzine (ATARAX) 25 MG tablet Take 25 mg by mouth at bedtime.    Yes [provider]  levothyroxine (SYNTHROID, LEVOTHROID) 25 MCG tablet Take 25 mcg by mouth daily.     Yes [provider]  lisinopril (PRINIVIL,ZESTRIL) 20 MG tablet Take 1 tablet (20 mg total) by mouth daily. 11/25/18 11/20/19 Yes Martinique, Peter M, MD  loperamide (IMODIUM A-D) 2 MG tablet Take 2-4 mg by mouth as needed for diarrhea or loose stools.   Yes [provider]  Magnesium 400 MG CAPS Take 400 mg by mouth every evening.   Yes [provider]  meclizine (ANTIVERT) 25 MG tablet Take 25 mg by mouth 2 (two) times daily as needed for dizziness or nausea.    Yes [provider]  metoprolol tartrate (LOPRESSOR) 50 MG tablet Take 1.5 tablets (75 mg total) by mouth 2 (two) times daily. 11/25/18  Yes Martinique, Peter M, MD    nitroGLYCERIN (NITROSTAT) 0.4 MG SL tablet Place 1 tablet (0.4 mg total) under the tongue every 5 (five) minutes as needed for chest pain. 10/15/11  Yes Larey Dresser, MD  ondansetron (ZOFRAN) 4 MG tablet Take 1 tablet (4 mg total) by mouth every 8 (eight) hours as needed for nausea or vomiting. 01/20/19  Yes Mesner, Corene Cornea, MD  pantoprazole (PROTONIX) 40 MG tablet Take 1 tablet (40 mg total) by mouth daily. NEED OV. 11/25/18  Yes Martinique, Peter M, MD  senna (SENOKOT) 8.6 MG TABS tablet Take 1 tablet by mouth daily as needed for mild constipation.    Yes [provider]  traMADol (ULTRAM) 50 MG tablet Take 1-2 tablets (50-100 mg total) by mouth every 6 (six) hours as needed for moderate pain or severe pain. 03/23/19  Yes Michael Boston,  MD    Family History Family History  Problem Relation Age of Onset   Stroke Mother    Heart attack Father    Diabetes Father    Kidney failure Father     Social History Social History   Tobacco Use   Smoking status: Former Smoker    Packs/day: 1.00    Years: 21.00    Pack years: 21.00    Types: Cigarettes    Last attempt to quit: 04/07/1976    Years since quitting: 42.9   Smokeless tobacco: Never Used  Substance Use Topics   Alcohol use: Not Currently    Comment: 10/15/11 "haven't drank in 15-20 years"   Drug use: No     Allergies   Patient has no known allergies.   Review of Systems Review of Systems 10 Systems reviewed and are negative for acute change except as noted in the HPI.  Physical Exam Updated Vital Signs BP (!) 154/73    Pulse 70    Temp (S) (!) 100.7 F (38.2 C) (Rectal)    Resp 18    Ht 6' (1.829 m)    Wt 86.6 kg    SpO2 93%    BMI 25.89 kg/m   Physical Exam Constitutional:      Comments: Patient is alert without respiratory distress at rest.  He is frail in appearance.  Does not seem overtly confused but does display some subtle picking behavior.  HENT:     Head: Normocephalic and atraumatic.      Mouth/Throat:     Mouth: Mucous membranes are dry.     Pharynx: Oropharynx is clear.  Eyes:     Extraocular Movements: Extraocular movements intact.  Cardiovascular:     Rate and Rhythm: Normal rate.     Pulses: Normal pulses.  Pulmonary:     Effort: Pulmonary effort is normal.     Breath sounds: Normal breath sounds.  Abdominal:     Comments: A infraumbilical dressed trocar site.  Covered.  Abdomen is soft without guarding.  Musculoskeletal: Normal range of motion.        General: No swelling or tenderness.     Right lower leg: No edema.     Left lower leg: No edema.  Skin:    General: Skin is warm and dry.     Coloration: Skin is pale.  Neurological:     Comments: Patient is alert and answering questions.  He seems to have some subtle picking behavior and mildly irritable.  Commands appropriately. no Focal motor deficit.  Uses both hands to hold the handrails and pull himself forward to assist in exam.  Can move each lower extremity independently at request.  Speech is not slurred.  Content is situationally appropriate.      ED Treatments / Results  Labs (all labs ordered are listed, but only abnormal results are displayed) Labs Reviewed  COMPREHENSIVE METABOLIC PANEL - Abnormal; Notable for the following components:      Result Value   Glucose, Bld 102 (*)    Creatinine, Ser 1.35 (*)    Total Protein 6.2 (*)    AST 47 (*)    GFR calc non Af Amer 50 (*)    GFR calc Af Amer 57 (*)    All other components within normal limits  BRAIN NATRIURETIC PEPTIDE - Abnormal; Notable for the following components:   B Natriuretic Peptide 196.3 (*)    All other components within normal limits  TROPONIN I - Abnormal; Notable  for the following components:   Troponin I 0.19 (*)    All other components within normal limits  LACTIC ACID, PLASMA - Abnormal; Notable for the following components:   Lactic Acid, Venous 2.0 (*)    All other components within normal limits  CBC WITH  DIFFERENTIAL/PLATELET - Abnormal; Notable for the following components:   WBC 16.1 (*)    Neutro Abs 13.5 (*)    Monocytes Absolute 1.5 (*)    Abs Immature Granulocytes 0.10 (*)    All other components within normal limits  URINALYSIS, ROUTINE W REFLEX MICROSCOPIC - Abnormal; Notable for the following components:   Hgb urine dipstick SMALL (*)    All other components within normal limits  SARS CORONAVIRUS 2 (HOSPITAL ORDER, [place] [facility])  CULTURE, BLOOD (ROUTINE X 2)  CULTURE, BLOOD (ROUTINE X 2)  LACTIC ACID, PLASMA  PROTIME-INR  RAPID URINE DRUG SCREEN, HOSP PERFORMED  [facility]    EKG EKG Interpretation  Date/Time:  Friday [place] 29 2020 09:08:50 EDT Ventricular Rate:  67 PR Interval:    QRS Duration: 128 QT Interval:  425 QTC Calculation: 449 R Axis:   -31 Text Interpretation:  Sinus rhythm Nonspecific intraventricular conduction delay anterior t wave slightly more acute than most recent comparison Confirmed by Charlesetta Shanks [phone]) on 03/25/2019 10:34:27 AM Also confirmed by Charlesetta Shanks [phone]), editor Philomena Doheny [phone])  on 03/25/2019 11:29:53 AM   Radiology Dg Cholangiogram Operative  Result Date: 03/24/2019 CLINICAL DATA:  Gallstones EXAM: INTRAOPERATIVE CHOLANGIOGRAM TECHNIQUE: Cholangiographic images from the C-arm fluoroscopic device were submitted for interpretation post-operatively. Please see the procedural report for the amount of contrast and the fluoroscopy time utilized. COMPARISON:  None. FINDINGS: Contrast fills the biliary tree and duodenum without filling defects in the common bile duct. IMPRESSION: Patent biliary tree. Electronically Signed   By: Marybelle Killings M.D.   On: 03/24/2019 07:25   Ct Head Wo Contrast  Result Date: 03/25/2019 CLINICAL DATA:  Confusion over the last couple of days EXAM: CT HEAD WITHOUT CONTRAST TECHNIQUE: Contiguous axial images were obtained from the base of the skull through the vertex without intravenous  contrast. COMPARISON:  None. FINDINGS: Brain: No evidence of acute infarction, hemorrhage, extra-axial collection, ventriculomegaly, or mass effect. Generalized cerebral atrophy. Periventricular white matter low attenuation likely secondary to microangiopathy. Vascular: Cerebrovascular atherosclerotic calcifications are noted. Skull: Negative for fracture or focal lesion. Sinuses/Orbits: Visualized portions of the orbits are unremarkable. Mastoid sinuses are clear. Mild left maxillary sinus mucosal thickening. Prior sinus surgery. Other: None. IMPRESSION: No acute intracranial pathology. Electronically Signed   By: Kathreen Devoid   On: 03/25/2019 11:11   Ct Angio Chest Pe W/cm &/or Wo Cm  Result Date: 03/25/2019 CLINICAL DATA:  Elevated D-dimer level. Right upper quadrant pain status post cholecystectomy. EXAM: CT ANGIOGRAPHY CHEST CT ABDOMEN AND PELVIS WITH CONTRAST TECHNIQUE: Multidetector CT imaging of the chest was performed using the standard protocol during bolus administration of intravenous contrast. Multiplanar CT image reconstructions and MIPs were obtained to evaluate the vascular anatomy. Multidetector CT imaging of the abdomen and pelvis was performed using the standard protocol during bolus administration of intravenous contrast. CONTRAST:  122mL OMNIPAQUE IOHEXOL 350 MG/ML SOLN COMPARISON:  CT scan of January 20, 2019. FINDINGS: CTA CHEST FINDINGS Cardiovascular: Satisfactory opacification of the pulmonary arteries to the segmental level. No evidence of pulmonary embolism. Normal heart size. No pericardial effusion. Coronary artery calcifications are noted. Mediastinum/Nodes: No enlarged mediastinal, hilar, or axillary lymph nodes. Thyroid gland,  trachea, and esophagus demonstrate no significant findings. Lungs/Pleura: No pneumothorax is noted. No significant pleural effusion is noted. Mild bilateral posterior basilar subsegmental atelectasis is noted. Musculoskeletal: No chest wall abnormality. No  acute or significant osseous findings. Review of the MIP images confirms the above findings. CT ABDOMEN and PELVIS FINDINGS Hepatobiliary: Status post cholecystectomy 2 days ago. No significant fluid collection is noted in the gallbladder fossa. No biliary dilatation is noted. The liver is otherwise unremarkable. Mild pneumoperitoneum is noted in epigastric region which most likely is postoperative in etiology. Pancreas: Stable pancreatic head calcifications are noted suggesting chronic pancreatitis. No acute inflammation or ductal dilatation is noted. Spleen: Normal in size without focal abnormality. Adrenals/Urinary Tract: Adrenal glands appear normal. Right renal cyst is noted. No hydronephrosis or renal obstruction is noted. Urinary bladder is unremarkable. Stomach/Bowel: The stomach is unremarkable. No small bowel dilatation is noted. Dilatation of the right and transverse colon is noted which most likely represents postoperative ileus. The appendix is not visualized. Vascular/Lymphatic: Aortic atherosclerosis. No enlarged abdominal or pelvic lymph nodes. Reproductive: Stable mild prostatic enlargement is noted. Other: No abdominal wall hernia or abnormality. No abdominopelvic ascites. Musculoskeletal: Multilevel degenerative disc disease is noted in the lower lumbar spine. No acute osseous abnormality is noted. Review of the MIP images confirms the above findings. IMPRESSION: No definite evidence of pulmonary embolus. Coronary artery calcifications are noted. Mild bilateral posterior basilar subsegmental atelectasis is noted. Status post cholecystectomy 2 days ago. No significant fluid collection is noted in the gallbladder fossa. Mild pneumoperitoneum is noted in the epigastric region which most likely is postoperative in etiology. Aortic Atherosclerosis (ICD10-I70.0). Electronically Signed   By: Marijo Conception M.D.   On: 03/25/2019 14:33   Ct Abdomen Pelvis W Contrast  Result Date: 03/25/2019 CLINICAL  DATA:  Elevated D-dimer level. Right upper quadrant pain status post cholecystectomy. EXAM: CT ANGIOGRAPHY CHEST CT ABDOMEN AND PELVIS WITH CONTRAST TECHNIQUE: Multidetector CT imaging of the chest was performed using the standard protocol during bolus administration of intravenous contrast. Multiplanar CT image reconstructions and MIPs were obtained to evaluate the vascular anatomy. Multidetector CT imaging of the abdomen and pelvis was performed using the standard protocol during bolus administration of intravenous contrast. CONTRAST:  139mL OMNIPAQUE IOHEXOL 350 MG/ML SOLN COMPARISON:  CT scan of January 20, 2019. FINDINGS: CTA CHEST FINDINGS Cardiovascular: Satisfactory opacification of the pulmonary arteries to the segmental level. No evidence of pulmonary embolism. Normal heart size. No pericardial effusion. Coronary artery calcifications are noted. Mediastinum/Nodes: No enlarged mediastinal, hilar, or axillary lymph nodes. Thyroid gland, trachea, and esophagus demonstrate no significant findings. Lungs/Pleura: No pneumothorax is noted. No significant pleural effusion is noted. Mild bilateral posterior basilar subsegmental atelectasis is noted. Musculoskeletal: No chest wall abnormality. No acute or significant osseous findings. Review of the MIP images confirms the above findings. CT ABDOMEN and PELVIS FINDINGS Hepatobiliary: Status post cholecystectomy 2 days ago. No significant fluid collection is noted in the gallbladder fossa. No biliary dilatation is noted. The liver is otherwise unremarkable. Mild pneumoperitoneum is noted in epigastric region which most likely is postoperative in etiology. Pancreas: Stable pancreatic head calcifications are noted suggesting chronic pancreatitis. No acute inflammation or ductal dilatation is noted. Spleen: Normal in size without focal abnormality. Adrenals/Urinary Tract: Adrenal glands appear normal. Right renal cyst is noted. No hydronephrosis or renal obstruction is  noted. Urinary bladder is unremarkable. Stomach/Bowel: The stomach is unremarkable. No small bowel dilatation is noted. Dilatation of the right and transverse colon is noted which  most likely represents postoperative ileus. The appendix is not visualized. Vascular/Lymphatic: Aortic atherosclerosis. No enlarged abdominal or pelvic lymph nodes. Reproductive: Stable mild prostatic enlargement is noted. Other: No abdominal wall hernia or abnormality. No abdominopelvic ascites. Musculoskeletal: Multilevel degenerative disc disease is noted in the lower lumbar spine. No acute osseous abnormality is noted. Review of the MIP images confirms the above findings. IMPRESSION: No definite evidence of pulmonary embolus. Coronary artery calcifications are noted. Mild bilateral posterior basilar subsegmental atelectasis is noted. Status post cholecystectomy 2 days ago. No significant fluid collection is noted in the gallbladder fossa. Mild pneumoperitoneum is noted in the epigastric region which most likely is postoperative in etiology. Aortic Atherosclerosis (ICD10-I70.0). Electronically Signed   By: Marijo Conception M.D.   On: 03/25/2019 14:33   Dg Chest Port 1 View  Result Date: 03/25/2019 CLINICAL DATA:  Altered mental status EXAM: PORTABLE CHEST 1 VIEW COMPARISON:  October 29, 2018 FINDINGS: The degree of inspiration is shallow. There is no evident edema or consolidation. There is apparent atelectasis in the medial left base. Heart size and pulmonary vascularity are within normal limits. No adenopathy. There is aortic atherosclerosis. There is also calcification in the left carotid artery. There is degenerative change in each shoulder. Apparent postoperative change lateral right clavicle, stable. IMPRESSION: Atelectasis medial left base. No edema or consolidation. Heart size within normal limits. There is arthropathy in each shoulder with apparent postoperative change lateral right clavicle. Aortic Atherosclerosis  (ICD10-I70.0). There is left carotid artery calcification. Electronically Signed   By: Lowella Grip III M.D.   On: 03/25/2019 10:35    Procedures Procedures (including critical care time) CRITICAL CARE Performed by: Charlesetta Shanks   Total critical care time: 30 minutes  Critical care time was exclusive of separately billable procedures and treating other patients.  Critical care was necessary to treat or prevent imminent or life-threatening deterioration.  Critical care was time spent personally by me on the following activities: development of treatment plan with patient and/or surrogate as well as nursing, discussions with consultants, evaluation of patient's response to treatment, examination of patient, obtaining history from patient or surrogate, ordering and performing treatments and interventions, ordering and review of laboratory studies, ordering and review of radiographic studies, pulse oximetry and re-evaluation of patient's condition. Medications Ordered in ED Medications  0.9 %  sodium chloride infusion ( Intravenous New Bag/Given 03/25/19 1230)  iohexol (OMNIPAQUE) 350 MG/ML injection 100 mL (100 mLs Intravenous Contrast Given 03/25/19 1407)     Initial Impression / Assessment and Plan / ED Course  I have reviewed the triage vital signs and the nursing notes.  Pertinent labs & imaging results that were available during my care of the patient were reviewed by me and considered in my medical decision making (see chart for details).  Clinical Course as of [place] 29 1520  Fri [place] 29, 2020  1223 Consult: Reviewed with general surgery PA-C [place].  Since proceeding with CT PE study for elevated troponin and hypoxia go ahead and include CT abdomen for any postoperative issues.  They will see the patient in consultation for surgical condition but medical admission.   [MP]    Clinical Course User Index [MP] Charlesetta Shanks, MD      Consult: Reviewed with Dr. Maye Hides for  admission.  I reviewed the patient's history of present illness with his daughter.  She reports he had been very confused over about the past 12 hours.  It was atypically asking where he was and thinking  that he was not in his home.  Patient is post cholecystectomy 3 days ago.  At this time he has new hypoxia.  He has oxygen requirement.  CT scan has ruled out PE or significant pneumonia.  Does have elevated troponin.  Possible N STEMI or demand ischemia.  At this time, no evident source of infection.  Blood pressures remained stable.  Plan for admission.   Final Clinical Impressions(s) / ED Diagnoses   Final diagnoses:  Hypoxia  Confusion  Troponin I above reference range  S/P laparoscopic cholecystectomy    ED Discharge Orders    None       Charlesetta Shanks, MD 03/25/19 1521

## 2019-03-25 NOTE — ED Notes (Signed)
Patient transported to CT 

## 2019-03-25 NOTE — ED Notes (Signed)
Per pt daughter updated on pt condition and plan of care.

## 2019-03-25 NOTE — Telephone Encounter (Addendum)
Johnny [name]  03-Jul-1941 [id number]  Patient Care Team: Burnard Bunting, MD as PCP - General Martinique, Peter M, MD as PCP - Cardiology (Cardiology) Michael Boston, MD as Consulting Physician (General Surgery)  This patient is a 78 y.o.male who calls today for surgical evaluation.   POST-OPERATIVE DIAGNOSIS:   Acute on Chronic Calculus Cholecystitis  PROCEDURE:  SINGLE SITE Laparoscopic cholecystectomy with intraoperative cholangiogram  SURGEON:  [name], MD, FACS.   Reason for call: Confusion  Called by patient's daughter.  Patient went home yesterday postoperative day #1.  Tolerating liquids.  Had a good evening and night until about 3:00 this morning.  Seemed confused on where he was or what surgery he had done.  No focal weakness.  Able to move and get to the bathroom.  DAughter held some of his Cymbalta but seem better with Tylenol.  He did get his Plavix this morning.  Got his usual his morning medications.  Had some pain in his incision and got tramadol.  Pain seems less but patient more the usual mild confusion.  Agitated.  Daughter concerned.  Emergency services called.  [facility].  Apparently no difficulty with speech or any focal weakness.  No worsening abdominal pain.  No nausea or vomiting.  No diarrhea.  No bleeding at the incision.  No focal deficits in speech vision or movement.  Seems reasonable to double check that he is not having a stroke or other issues.  Called and discussed with patient's daughter.  She expressed appreciation.  Patient Active Problem List   Diagnosis Date Noted  . Acute on chronic cholecystitis s/p lap cholecystectomy 03/23/2019 03/23/2019  . Chronic anticoagulation 03/23/2019  . Chronic cholecystitis 03/23/2019  . Unstable angina ([place]) 11/05/2018  . Tachycardia 10/29/2018  . Pain in left hip 08/13/2018  . Hypothyroidism 11/04/2011  . SVT (supraventricular tachycardia) ([place]) 04/10/2011  . HTN (hypertension)  04/10/2011  . Dyslipidemia 04/10/2011  . Coronary atherosclerosis 11/25/2010  . GERD 11/25/2010  . CONSTIPATION 11/25/2010  . CHEST PAIN UNSPECIFIED 11/25/2010  . PERSONAL HISTORY OF COLONIC POLYPS 11/25/2010    Past Medical History:  Diagnosis Date  . Acromioclavicular joint arthritis   . Anemia   . B12 deficiency 10/15/11   "take injections q 28 days"  . Coronary artery disease    LHC 10/15/11: LAD stent patent with mid ectasia, distal LAD 50-60%, mid circumflex with ectasia, EF 55-65%.  . Dyslipidemia   . GERD (gastroesophageal reflux disease)   . Glucose intolerance (pre-diabetes)    Borderline glucose intolerance  . Hypertension   . Hypothyroidism   . Post concussive syndrome 2011   "for ~ 6-8 months"  . Rotator cuff tear   . Shortness of breath 10/15/11   "here lately I've been having it on & off any time"  . SVT (supraventricular tachycardia) ([place])    maintained on beta blocker    Past Surgical History:  Procedure Laterality Date  . BACK SURGERY    . CARDIAC CATHETERIZATION  12/19/08   NORMAL. EF 55%  . CARDIAC CATHETERIZATION  10/15/11  . CARDIOVASCULAR STRESS TEST  10/2010  . CARPAL TUNNEL RELEASE     bilaterally  . CHOLECYSTECTOMY  03/23/2019  . COLONOSCOPY W/ ENDOSCOPIC [place]    . CORONARY ANGIOPLASTY WITH STENT PLACEMENT  9/ 2005   . JOINT REPLACEMENT    . LAPAROSCOPIC CHOLECYSTECTOMY SINGLE SITE WITH INTRAOPERATIVE CHOLANGIOGRAM N/A 03/23/2019   Procedure: SINGLE SITE LAPAROSCOPIC CHOLECYSTECTOMY WITH INTRAOPERATIVE CHOLANGIOGRAM;  Surgeon: Johney Maine,  Remo Lipps, MD;  Location: [place];  Service: General;  Laterality: N/A;  . LEFT HEART CATH AND CORONARY ANGIOGRAPHY N/A 11/05/2018   Procedure: LEFT HEART CATH AND CORONARY ANGIOGRAPHY;  Surgeon: Martinique, Peter M, MD;  Location: [facility];  Service: Cardiovascular;  Laterality: N/A;  . LEFT HEART CATHETERIZATION WITH CORONARY ANGIOGRAM N/A 10/15/2011   Procedure: LEFT HEART CATHETERIZATION WITH CORONARY ANGIOGRAM;   Surgeon: Sherren Mocha, MD;  Location: [facility] CATH LAB;  Service: Cardiovascular;  Laterality: N/A;  . LUMBAR LAMINECTOMY  1970's  . NASAL SINUS SURGERY     x2  . reconstructive shoulder surgery     right  . REPLACEMENT TOTAL KNEE  12/2007   left  . RESECTION TUMOR CLAVICLE RADICAL     Open distal clavicle resection  . SHOULDER SURGERY  02/11/2011   right  . TRIGGER FINGER RELEASE     ? both hands    Social History   Socioeconomic History  . Marital status: Married    Spouse name: Not on file  . Number of children: 2  . Years of education: Not on file  . Highest education level: Not on file  Occupational History    Employer: RETIRED  Social Needs  . Financial resource strain: Not on file  . Food insecurity:    Worry: Not on file    Inability: Not on file  . Transportation needs:    Medical: Not on file    Non-medical: Not on file  Tobacco Use  . Smoking status: Former Smoker    Packs/day: 1.00    Years: 21.00    Pack years: 21.00    Types: Cigarettes    Last attempt to quit: 04/07/1976    Years since quitting: 42.9  . Smokeless tobacco: Never Used  Substance and Sexual Activity  . Alcohol use: Not Currently    Comment: 10/15/11 "haven't drank in 15-20 years"  . Drug use: No  . Sexual activity: Not Currently  Lifestyle  . Physical activity:    Days per week: Not on file    Minutes per session: Not on file  . Stress: Not on file  Relationships  . Social connections:    Talks on phone: Not on file    Gets together: Not on file    Attends religious service: Not on file    Active member of club or organization: Not on file    Attends meetings of clubs or organizations: Not on file    Relationship status: Not on file  . Intimate partner violence:    Fear of current or ex partner: Not on file    Emotionally abused: Not on file    Physically abused: Not on file    Forced sexual activity: Not on file  Other Topics Concern  . Not on file  Social History Narrative   . Not on file    Family History  Problem Relation Age of Onset  . Stroke Mother   . Heart attack Father   . Diabetes Father   . Kidney failure Father     Current Outpatient Medications  Medication Sig Dispense Refill  . acetaminophen (TYLENOL) 650 MG CR tablet Take 1,300 mg by mouth 2 (two) times daily.     Jearl Klinefelter ELLIPTA 62.5-25 MCG/INH AEPB Take 1 puff by mouth daily as needed (shortness of breath).     [name] aspirin 81 MG tablet Take 81 mg by mouth daily.      [name] atorvastatin (LIPITOR) 10 MG  tablet Take 1 tablet (10 mg total) by mouth daily. 90 tablet 3  . Cholecalciferol (VITAMIN D3) 50 MCG (2000 UT) capsule Take 2,000 Units by mouth every evening.    . clopidogrel (PLAVIX) 75 MG tablet TAKE 1 TABLET BY MOUTH EVERY DAY (Patient taking differently: Take 75 mg by mouth daily. ) 90 tablet 1  . Cyanocobalamin (VITAMIN B-12 IJ) Inject 1 Dose as directed every 30 (thirty) days.     . diclofenac sodium (VOLTAREN) 1 % GEL Apply 2 g topically 4 (four) times daily. (Patient taking differently: Apply 2 g topically 4 (four) times daily as needed (hip pain). ) 1 Tube 5  . DULoxetine (CYMBALTA) 30 MG capsule Take 30 mg by mouth every evening.    . Glucos-Chond-Hyal Ac-Ca Fructo (MOVE FREE JOINT HEALTH ADVANCE PO) Take 1 capsule by mouth 2 (two) times daily.    . hydrOXYzine (ATARAX) 25 MG tablet Take 25 mg by mouth at bedtime.     [name] levothyroxine (SYNTHROID, LEVOTHROID) 25 MCG tablet Take 25 mcg by mouth daily.      [name] lisinopril (PRINIVIL,ZESTRIL) 20 MG tablet Take 1 tablet (20 mg total) by mouth daily. 90 tablet 3  . loperamide (IMODIUM A-D) 2 MG tablet Take 2-4 mg by mouth as needed for diarrhea or loose stools.    . Magnesium 400 MG CAPS Take 400 mg by mouth every evening.    . meclizine (ANTIVERT) 25 MG tablet Take 25 mg by mouth 2 (two) times daily as needed for dizziness or nausea.     . metoprolol tartrate (LOPRESSOR) 50 MG tablet Take 1.5 tablets (75 mg total) by mouth 2 (two) times daily.  270 tablet 3  . nitroGLYCERIN (NITROSTAT) 0.4 MG SL tablet Place 1 tablet (0.4 mg total) under the tongue every 5 (five) minutes as needed for chest pain. 1 tablet 0  . ondansetron (ZOFRAN) 4 MG tablet Take 1 tablet (4 mg total) by mouth every 8 (eight) hours as needed for nausea or vomiting. 12 tablet 0  . pantoprazole (PROTONIX) 40 MG tablet Take 1 tablet (40 mg total) by mouth daily. NEED OV. 180 tablet 1  . senna (SENOKOT) 8.6 MG TABS tablet Take 1 tablet by mouth daily as needed for mild constipation.     . traMADol (ULTRAM) 50 MG tablet Take 1-2 tablets (50-100 mg total) by mouth every 6 (six) hours as needed for moderate pain or severe pain. 20 tablet 0   No current facility-administered medications for this visit.      No Known Allergies  @VS @  Dg Cholangiogram Operative  Result Date: 03/24/2019 CLINICAL DATA:  Gallstones EXAM: INTRAOPERATIVE CHOLANGIOGRAM TECHNIQUE: Cholangiographic images from the C-arm fluoroscopic device were submitted for interpretation post-operatively. Please see the procedural report for the amount of contrast and the fluoroscopy time utilized. COMPARISON:  None. FINDINGS: Contrast fills the biliary tree and duodenum without filling defects in the common bile duct. IMPRESSION: Patent biliary tree. Electronically Signed   By: Marybelle Killings M.D.   On: 03/24/2019 07:25    Note: This dictation was prepared with Dragon/digital dictation along with [employer]. Any transcriptional errors that result from this process are unintentional.   .[name], M.D., F.A.C.S. Gastrointestinal and Minimally Invasive Surgery [facility] [address], [place], [place] [address] [phone] Main / Paging  03/25/2019 9:31 AM

## 2019-03-26 ENCOUNTER — Observation Stay (HOSPITAL_COMMUNITY): Payer: Medicare Other

## 2019-03-26 DIAGNOSIS — K219 Gastro-esophageal reflux disease without esophagitis: Secondary | ICD-10-CM | POA: Diagnosis not present

## 2019-03-26 DIAGNOSIS — R6510 Systemic inflammatory response syndrome (SIRS) of non-infectious origin without acute organ dysfunction: Secondary | ICD-10-CM | POA: Diagnosis present

## 2019-03-26 DIAGNOSIS — E785 Hyperlipidemia, unspecified: Secondary | ICD-10-CM | POA: Diagnosis present

## 2019-03-26 DIAGNOSIS — I2510 Atherosclerotic heart disease of native coronary artery without angina pectoris: Secondary | ICD-10-CM | POA: Diagnosis present

## 2019-03-26 DIAGNOSIS — I10 Essential (primary) hypertension: Secondary | ICD-10-CM | POA: Diagnosis present

## 2019-03-26 DIAGNOSIS — K8010 Calculus of gallbladder with chronic cholecystitis without obstruction: Secondary | ICD-10-CM | POA: Diagnosis present

## 2019-03-26 DIAGNOSIS — E538 Deficiency of other specified B group vitamins: Secondary | ICD-10-CM | POA: Diagnosis present

## 2019-03-26 DIAGNOSIS — E039 Hypothyroidism, unspecified: Secondary | ICD-10-CM | POA: Diagnosis present

## 2019-03-26 DIAGNOSIS — R4182 Altered mental status, unspecified: Secondary | ICD-10-CM | POA: Diagnosis not present

## 2019-03-26 DIAGNOSIS — E872 Acidosis: Secondary | ICD-10-CM | POA: Diagnosis present

## 2019-03-26 DIAGNOSIS — Z6825 Body mass index (BMI) 25.0-25.9, adult: Secondary | ICD-10-CM | POA: Diagnosis not present

## 2019-03-26 DIAGNOSIS — I471 Supraventricular tachycardia: Secondary | ICD-10-CM | POA: Diagnosis present

## 2019-03-26 DIAGNOSIS — Z20828 Contact with and (suspected) exposure to other viral communicable diseases: Secondary | ICD-10-CM | POA: Diagnosis present

## 2019-03-26 DIAGNOSIS — R0609 Other forms of dyspnea: Secondary | ICD-10-CM | POA: Diagnosis not present

## 2019-03-26 DIAGNOSIS — K812 Acute cholecystitis with chronic cholecystitis: Secondary | ICD-10-CM | POA: Diagnosis not present

## 2019-03-26 DIAGNOSIS — N179 Acute kidney failure, unspecified: Secondary | ICD-10-CM | POA: Diagnosis present

## 2019-03-26 DIAGNOSIS — I25110 Atherosclerotic heart disease of native coronary artery with unstable angina pectoris: Secondary | ICD-10-CM | POA: Diagnosis present

## 2019-03-26 DIAGNOSIS — J9811 Atelectasis: Secondary | ICD-10-CM | POA: Diagnosis present

## 2019-03-26 DIAGNOSIS — R0902 Hypoxemia: Secondary | ICD-10-CM | POA: Diagnosis present

## 2019-03-26 DIAGNOSIS — R627 Adult failure to thrive: Secondary | ICD-10-CM | POA: Diagnosis present

## 2019-03-26 DIAGNOSIS — R413 Other amnesia: Secondary | ICD-10-CM | POA: Diagnosis present

## 2019-03-26 DIAGNOSIS — I129 Hypertensive chronic kidney disease with stage 1 through stage 4 chronic kidney disease, or unspecified chronic kidney disease: Secondary | ICD-10-CM | POA: Diagnosis present

## 2019-03-26 DIAGNOSIS — F329 Major depressive disorder, single episode, unspecified: Secondary | ICD-10-CM | POA: Diagnosis present

## 2019-03-26 DIAGNOSIS — E876 Hypokalemia: Secondary | ICD-10-CM | POA: Diagnosis not present

## 2019-03-26 DIAGNOSIS — T501X5A Adverse effect of loop [high-ceiling] diuretics, initial encounter: Secondary | ICD-10-CM | POA: Diagnosis not present

## 2019-03-26 DIAGNOSIS — D72829 Elevated white blood cell count, unspecified: Secondary | ICD-10-CM | POA: Diagnosis present

## 2019-03-26 DIAGNOSIS — G9341 Metabolic encephalopathy: Secondary | ICD-10-CM | POA: Diagnosis present

## 2019-03-26 DIAGNOSIS — N183 Chronic kidney disease, stage 3 (moderate): Secondary | ICD-10-CM | POA: Diagnosis present

## 2019-03-26 LAB — TSH: TSH: 3.504 u[IU]/mL (ref 0.350–4.500)

## 2019-03-26 LAB — BASIC METABOLIC PANEL
Anion gap: 14 (ref 5–15)
BUN: 15 mg/dL (ref 8–23)
CO2: 25 mmol/L (ref 22–32)
Calcium: 9.2 mg/dL (ref 8.9–10.3)
Chloride: 99 mmol/L (ref 98–111)
Creatinine, Ser: 1.07 mg/dL (ref 0.61–1.24)
GFR calc Af Amer: >60 mL/min (ref >60)
GFR calc non Af Amer: >60 mL/min (ref >60)
Glucose, Bld: 102 mg/dL — ABNORMAL HIGH (ref 70–99)
Potassium: 3.3 mmol/L — ABNORMAL LOW (ref 3.5–5.1)
Sodium: 138 mmol/L (ref 135–145)

## 2019-03-26 LAB — CBC
HCT: 40.8 % (ref 39.0–52.0)
Hemoglobin: 13.8 g/dL (ref 13.0–17.0)
MCH: 30.9 pg (ref 26.0–34.0)
MCHC: 33.8 g/dL (ref 30.0–36.0)
MCV: 91.3 fL (ref 80.0–100.0)
Platelets: 181 10*3/uL (ref 150–400)
RBC: 4.47 MIL/uL (ref 4.22–5.81)
RDW: 12.6 % (ref 11.5–15.5)
WBC: 12.8 10*3/uL — ABNORMAL HIGH (ref 4.0–10.5)
nRBC: 0 % (ref 0.0–0.2)

## 2019-03-26 LAB — VITAMIN B12: Vitamin B-12: 522 pg/mL (ref 180–914)

## 2019-03-26 LAB — ECHOCARDIOGRAM COMPLETE
Height: 72 in
Weight: 2987.67 oz

## 2019-03-26 LAB — FOLATE: Folate: 8.2 ng/mL (ref >5.9)

## 2019-03-26 LAB — MAGNESIUM: Magnesium: 1.9 mg/dL (ref 1.7–2.4)

## 2019-03-26 LAB — AMMONIA: Ammonia: 29 umol/L (ref 9–35)

## 2019-03-26 LAB — LACTIC ACID, PLASMA: Lactic Acid, Venous: 3.2 mmol/L (ref 0.5–1.9)

## 2019-03-26 LAB — TROPONIN I: Troponin I: 0.12 ng/mL (ref <0.03)

## 2019-03-26 MED ORDER — LORAZEPAM 2 MG/ML IJ SOLN: 1.0000 mg | Freq: Once | INTRAMUSCULAR | Status: DC

## 2019-03-26 MED ORDER — POTASSIUM CHLORIDE CRYS ER 20 MEQ PO TBCR
40.0000 meq | EXTENDED_RELEASE_TABLET | Freq: Once | ORAL | Status: AC
  Administered 2019-03-26: 17:00:00 40 meq via ORAL
  Filled 2019-03-26: qty 2

## 2019-03-26 MED ORDER — HALOPERIDOL LACTATE 5 MG/ML IJ SOLN
5.0000 mg | Freq: Once | INTRAMUSCULAR | Status: AC
  Administered 2019-03-26: 5 mg via INTRAVENOUS
  Filled 2019-03-26: qty 1

## 2019-03-26 MED ORDER — HALOPERIDOL LACTATE 5 MG/ML IJ SOLN
5.0000 mg | Freq: Once | INTRAMUSCULAR | Status: AC
  Administered 2019-03-26: 03:00:00 5 mg via INTRAVENOUS
  Filled 2019-03-26: qty 1

## 2019-03-26 NOTE — Progress Notes (Signed)
  Echocardiogram 2D Echocardiogram has been performed.  Randa Lynn Adilynn Bessey 03/26/2019, 12:34 PM

## 2019-03-26 NOTE — Progress Notes (Signed)
PROGRESS NOTE    Ezell M Masur  MRN:[id number] DOB: August 31, 1941 DOA: 03/25/2019 PCP: Burnard Bunting, MD   Brief Narrative:  HPI On 03/25/2019  Niam M Formisano is a 78 y.o. male with a medical history of coronary artery disease, hypertension, hypothyroidism, recently admitted and discharged on 03/24/2019 for laparoscopic cholecystectomy.  Patient presented today to the emergency department for confusion.  Seems the patient was brought to the emergency department as he has had confusion of the past several days.  However per discharge summary on 03/24/2019, he was confused at that time.  Unknown baseline.  Patient does currently endorse shortness of breath more with movement.  He denies current chest pain, abdominal pain, nausea or vomiting, diarrhea or constipation, dizziness or headache, problems with urination, recent illness or travel.  Does state that he has to get to the station and go home.  Interim history Admitted with confusion, hypoxia. Workup for CHF pending. Patient continues to be confused.  Assessment & Plan   Acute metabolic encephalopathy -Secondary to hypoxia versus recent surgery and anesthesia versus medications -Continue to monitor -CT head showed no acute intracranial pathology -Not sure of patient's baseline however per discharge summary done on 03/24/2019, patient noted to have some mild memory loss and confusion but able to be oriented. -Discussed with daughter today, she states that he normally is AAOx3, no history of dementia or memory issues. States when she picked him up on 5/28, he was fine, however during the night he became confused and agitated.  -Obtained TSH 3.504, B12 level: 522, Folate 8.2 (WNL) -Currently no infectious etiology to explain AMS -Vicente consider MRI if patient does not improve- however, currently no signs or symptoms of CVA- speech is coherent, able to follow directions, etc   Hypoxia -Noted to have an SPO2 of 88% on admission -Continue supplemental  oxygen to maintain saturations above 92% -Continue incentive spirometry -Chest x-ray unremarkable for infection, although some atelectasis on the left -CTA chest unremarkable for PE -Possibly secondary to excess volume received during recent hospitalization and surgery  Elevated troponin -Patient currently denies chest pain -EKG reviewed, no changes from prior EKG -Continue to cycle troponin- peaked at 0.19 and trending downward -Patient with history of CAD, continue, aspirin, statin, metoprolol -Of note, patient did have cardiac catheterization on 11/05/2018 showed nonobstructive CAD.  Plan was to continue medical therapy and obtain echocardiogram as an outpatient however no echocardiogram noted in chart  Elevated BNP -Patient does not overtly appear to be hypervolemic on exam -BNP 196.3 -Echocardiogram ordered -Monitor intake and output, daily weights -No history of CHF -One dose of IV lasix ordered on admission- good UOP  SIRS -Patient with fever of 100.6, leukocytosis- both improving  -UA and chest x-ray unremarkable for infection -CT and pelvis showed no significant fluid collection in the gallbladder fossa.  Mild pneumoperitoneum in the epigastric region likely postoperative in etiology -Hold off antibiotics at this time -Surgery consulted and appreciated- no recommendations  AKI versus CKD, stage III -Creatinine on admission 1.35, although has been in stage III since January 2020 -Creatinine 1.07 today -Continue to monitor BMP  Recent laparoscopic cholecystectomy -Above, general surgery consulted and appreciated- have signed off  Depression -Continue Cymbalta  GERD -Continue PPI  Essential hypertension -Hold lisinopril -BP currently stable  -Continue metoprolol  Hypokalemia -likely secondary to lasix -replace and monitor   DVT Prophylaxis  Lovenox  Code Status: Full  Family Communication: None at bedside. Daughter via phone  Disposition Plan:  Observation. Given continue confusion  and agitation, patient will likely require additional in hospital stay. Pending CHF w/up.   Consultants General surgery  Procedures  None  Antibiotics   Anti-infectives (From admission, onward)   None      Subjective:   Michiah Kimberling seen and examined today.  Has no complaints. Thinks he is at home.   Objective:   Vitals:   03/25/19 2052 03/26/19 0434 03/26/19 0500 03/26/19 1053  BP:  133/85    Pulse: 93 74  72  Resp: 18 (!) 22    Temp: 98.4 F (36.9 C) (!) 97.4 F (36.3 C)    TempSrc: Axillary Oral    SpO2: 93% 93%    Weight:   84.7 kg   Height:        Intake/Output Summary (Last 24 hours) at 03/26/2019 1148 Last data filed at 03/26/2019 0449 Gross per 24 hour  Intake --  Output 1675 ml  Net -1675 ml   Filed Weights   03/25/19 0912 03/26/19 0500  Weight: 86.6 kg 84.7 kg    Exam  General: Well developed, well nourished, NAD, appears stated age  HEENT: NCAT, mucous membranes moist.   Neck: Supple  Cardiovascular: S1 S2 auscultated, RRR, no murmur  Respiratory: Clear to auscultation bilaterally with equal chest rise  Abdomen: Soft, nontender, nondistended, + bowel sounds, surgical incisions clean  Extremities: warm dry without cyanosis clubbing or edema  Neuro: AAOx1 (self only), confused. Nonfocal  Psych: Confused, pleasant at this time   Data Reviewed: I have personally reviewed following labs and imaging studies  CBC: Recent Labs  Lab 03/25/19 1053 03/25/19 1851 03/26/19 0723  WBC 16.1* 17.2* 12.8*  NEUTROABS 13.5*  --   --   HGB 14.3 14.4 13.8  HCT 44.1 42.6 40.8  MCV 94.8 91.8 91.3  PLT 219 195 696   Basic Metabolic Panel: Recent Labs  Lab 03/25/19 1053 03/25/19 1851 03/26/19 0723  NA 138  --  138  K 3.7  --  3.3*  CL 102  --  99  CO2 23  --  25  GLUCOSE 102*  --  102*  BUN 19  --  15  CREATININE 1.35* 1.22 1.07  CALCIUM 9.1  --  9.2   GFR: Estimated Creatinine Clearance: 61.4 mL/min  (by C-G formula based on SCr of 1.07 mg/dL). Liver Function Tests: Recent Labs  Lab 03/25/19 1053  AST 47*  ALT 36  ALKPHOS 45  BILITOT 1.0  PROT 6.2*  ALBUMIN 3.5   No results for input(s): LIPASE, AMYLASE in the last 168 hours. Recent Labs  Lab 03/25/19 1036  AMMONIA 10   Coagulation Profile: Recent Labs  Lab 03/25/19 1053  INR 1.1   Cardiac Enzymes: Recent Labs  Lab 03/25/19 1053 03/25/19 1851 03/26/19 0723  TROPONINI 0.19* 0.16* 0.12*   BNP (last 3 results) No results for input(s): PROBNP in the last 8760 hours. HbA1C: No results for input(s): HGBA1C in the last 72 hours. CBG: No results for input(s): GLUCAP in the last 168 hours. Lipid Profile: No results for input(s): CHOL, HDL, LDLCALC, TRIG, CHOLHDL, LDLDIRECT in the last 72 hours. Thyroid Function Tests: Recent Labs    03/26/19 0900  TSH 3.504   Anemia Panel: Recent Labs    03/26/19 0900  VITAMINB12 522  FOLATE 8.2   Urine analysis:    Component Value Date/Time   COLORURINE YELLOW 03/25/2019 [place] 03/25/2019 1002   LABSPEC 1.012 03/25/2019 1002   PHURINE 5.0 03/25/2019 1002  GLUCOSEU NEGATIVE 03/25/2019 1002   HGBUR SMALL (A) 03/25/2019 [place] 03/25/2019 [place] 03/25/2019 [place] 03/25/2019 1002   UROBILINOGEN 0.2 01/19/2008 1246   NITRITE NEGATIVE 03/25/2019 1002   LEUKOCYTESUR NEGATIVE 03/25/2019 1002   Sepsis Labs: @LABRCNTIP (procalcitonin:4,lacticidven:4)  ) Recent Results (from the past 240 hour(s))  Novel Coronavirus, NAA (hospital order; send-out to ref lab)     Status: None   Collection Time: 03/18/19 12:50 PM  Result Value Ref Range Status   SARS-CoV-2, NAA NOT DETECTED NOT DETECTED Final    Comment: (NOTE) Testing was performed using the cobas(R) SARS-CoV-2 test. This test was developed and its performance characteristics determined by [employer]. This test has not been  FDA cleared or approved. This test has been authorized by FDA under an Emergency Use Authorization (EUA). This test is only authorized for the duration of time the declaration that circumstances exist justifying the authorization of the emergency use of in vitro diagnostic tests for detection of SARS-CoV-2 virus and/or diagnosis of COVID-19 infection under section 564(b)(1) of the Act, 21 U.S.C. 938HWE-9(H)(3), unless the authorization is terminated or revoked sooner. When diagnostic testing is negative, the possibility of a false negative result should be considered in the context of a patient's recent exposures and the presence of clinical signs and symptoms consistent with COVID-19. An individual without symptoms of COVID-19 and who is not shedding SARS-CoV-2 virus would expect to have  a negative (not detected) result in this assay. Performed At: [facility] [address] [place], [place] [id number] Rush Farmer MD YB:[id number]    [place]  Final    Comment: Performed at [facility], [place] [address] [place], [place] [address]  SARS Coronavirus 2 (CEPHEID- Performed in [place] hospital lab), Hosp Order     Status: None   Collection Time: 03/25/19 10:03 AM  Result Value Ref Range Status   SARS Coronavirus 2 NEGATIVE NEGATIVE Final    Comment: (NOTE) If result is NEGATIVE SARS-CoV-2 target nucleic acids are NOT DETECTED. The SARS-CoV-2 RNA is generally detectable in upper and lower  respiratory specimens during the acute phase of infection. The lowest  concentration of SARS-CoV-2 viral copies this assay can detect is 250  copies / mL. A negative result does not preclude SARS-CoV-2 infection  and should not be used as the sole basis for treatment or other  patient management decisions.  A negative result [place] occur with  improper specimen collection / handling, submission of specimen other  than nasopharyngeal swab, presence of viral  mutation(s) within the  areas targeted by this assay, and inadequate number of viral copies  (<250 copies / mL). A negative result must be combined with clinical  observations, patient history, and epidemiological information. If result is POSITIVE SARS-CoV-2 target nucleic acids are DETECTED. The SARS-CoV-2 RNA is generally detectable in upper and lower  respiratory specimens dur ing the acute phase of infection.  Positive  results are indicative of active infection with SARS-CoV-2.  Clinical  correlation with patient history and other diagnostic information is  necessary to determine patient infection status.  Positive results do  not rule out bacterial infection or co-infection with other viruses. If result is PRESUMPTIVE POSTIVE SARS-CoV-2 nucleic acids [place] BE PRESENT.   A presumptive positive result was obtained on the submitted specimen  and confirmed on repeat testing.  While 2019 novel coronavirus  (SARS-CoV-2) nucleic acids Schwarz be present in the submitted sample  additional confirmatory testing [place] be necessary for epidemiological  and / or clinical management purposes  to differentiate between  SARS-CoV-2 and other Sarbecovirus currently known to infect humans.  If clinically indicated additional testing with an alternate test  methodology [phone]) is advised. The SARS-CoV-2 RNA is generally  detectable in upper and lower respiratory sp ecimens during the acute  phase of infection. The expected result is Negative. Fact Sheet for Patients:  StrictlyIdeas.no Fact Sheet for Healthcare Providers: BankingDealers.co.za This test is not yet approved or cleared by the [place] FDA and has been authorized for detection and/or diagnosis of SARS-CoV-2 by FDA under an Emergency Use Authorization (EUA).  This EUA will remain in effect (meaning this test can be used) for the duration of the COVID-19 declaration under Section 564(b)(1)  of the Act, 21 U.S.C. section 360bbb-3(b)(1), unless the authorization is terminated or revoked sooner. Performed at [facility], [place] [address] [place], [place] [address]       Radiology Studies: Ct Head Wo Contrast  Result Date: 03/25/2019 CLINICAL DATA:  Confusion over the last couple of days EXAM: CT HEAD WITHOUT CONTRAST TECHNIQUE: Contiguous axial images were obtained from the base of the skull through the vertex without intravenous contrast. COMPARISON:  None. FINDINGS: Brain: No evidence of acute infarction, hemorrhage, extra-axial collection, ventriculomegaly, or mass effect. Generalized cerebral atrophy. Periventricular white matter low attenuation likely secondary to microangiopathy. Vascular: Cerebrovascular atherosclerotic calcifications are noted. Skull: Negative for fracture or focal lesion. Sinuses/Orbits: Visualized portions of the orbits are unremarkable. Mastoid sinuses are clear. Mild left maxillary sinus mucosal thickening. Prior sinus surgery. Other: None. IMPRESSION: No acute intracranial pathology. Electronically Signed   By: Kathreen Devoid   On: 03/25/2019 11:11   Ct Angio Chest Pe W/cm &/or Wo Cm  Result Date: 03/25/2019 CLINICAL DATA:  Elevated D-dimer level. Right upper quadrant pain status post cholecystectomy. EXAM: CT ANGIOGRAPHY CHEST CT ABDOMEN AND PELVIS WITH CONTRAST TECHNIQUE: Multidetector CT imaging of the chest was performed using the standard protocol during bolus administration of intravenous contrast. Multiplanar CT image reconstructions and MIPs were obtained to evaluate the vascular anatomy. Multidetector CT imaging of the abdomen and pelvis was performed using the standard protocol during bolus administration of intravenous contrast. CONTRAST:  166mL OMNIPAQUE IOHEXOL 350 MG/ML SOLN COMPARISON:  CT scan of January 20, 2019. FINDINGS: CTA CHEST FINDINGS Cardiovascular: Satisfactory opacification of the pulmonary arteries to the segmental level. No  evidence of pulmonary embolism. Normal heart size. No pericardial effusion. Coronary artery calcifications are noted. Mediastinum/Nodes: No enlarged mediastinal, hilar, or axillary lymph nodes. Thyroid gland, trachea, and esophagus demonstrate no significant findings. Lungs/Pleura: No pneumothorax is noted. No significant pleural effusion is noted. Mild bilateral posterior basilar subsegmental atelectasis is noted. Musculoskeletal: No chest wall abnormality. No acute or significant osseous findings. Review of the MIP images confirms the above findings. CT ABDOMEN and PELVIS FINDINGS Hepatobiliary: Status post cholecystectomy 2 days ago. No significant fluid collection is noted in the gallbladder fossa. No biliary dilatation is noted. The liver is otherwise unremarkable. Mild pneumoperitoneum is noted in epigastric region which most likely is postoperative in etiology. Pancreas: Stable pancreatic head calcifications are noted suggesting chronic pancreatitis. No acute inflammation or ductal dilatation is noted. Spleen: Normal in size without focal abnormality. Adrenals/Urinary Tract: Adrenal glands appear normal. Right renal cyst is noted. No hydronephrosis or renal obstruction is noted. Urinary bladder is unremarkable. Stomach/Bowel: The stomach is unremarkable. No small bowel dilatation is noted. Dilatation of the right and  transverse colon is noted which most likely represents postoperative ileus. The appendix is not visualized. Vascular/Lymphatic: Aortic atherosclerosis. No enlarged abdominal or pelvic lymph nodes. Reproductive: Stable mild prostatic enlargement is noted. Other: No abdominal wall hernia or abnormality. No abdominopelvic ascites. Musculoskeletal: Multilevel degenerative disc disease is noted in the lower lumbar spine. No acute osseous abnormality is noted. Review of the MIP images confirms the above findings. IMPRESSION: No definite evidence of pulmonary embolus. Coronary artery calcifications are  noted. Mild bilateral posterior basilar subsegmental atelectasis is noted. Status post cholecystectomy 2 days ago. No significant fluid collection is noted in the gallbladder fossa. Mild pneumoperitoneum is noted in the epigastric region which most likely is postoperative in etiology. Aortic Atherosclerosis (ICD10-I70.0). Electronically Signed   By: Marijo Conception M.D.   On: 03/25/2019 14:33   Ct Abdomen Pelvis W Contrast  Result Date: 03/25/2019 CLINICAL DATA:  Elevated D-dimer level. Right upper quadrant pain status post cholecystectomy. EXAM: CT ANGIOGRAPHY CHEST CT ABDOMEN AND PELVIS WITH CONTRAST TECHNIQUE: Multidetector CT imaging of the chest was performed using the standard protocol during bolus administration of intravenous contrast. Multiplanar CT image reconstructions and MIPs were obtained to evaluate the vascular anatomy. Multidetector CT imaging of the abdomen and pelvis was performed using the standard protocol during bolus administration of intravenous contrast. CONTRAST:  135mL OMNIPAQUE IOHEXOL 350 MG/ML SOLN COMPARISON:  CT scan of January 20, 2019. FINDINGS: CTA CHEST FINDINGS Cardiovascular: Satisfactory opacification of the pulmonary arteries to the segmental level. No evidence of pulmonary embolism. Normal heart size. No pericardial effusion. Coronary artery calcifications are noted. Mediastinum/Nodes: No enlarged mediastinal, hilar, or axillary lymph nodes. Thyroid gland, trachea, and esophagus demonstrate no significant findings. Lungs/Pleura: No pneumothorax is noted. No significant pleural effusion is noted. Mild bilateral posterior basilar subsegmental atelectasis is noted. Musculoskeletal: No chest wall abnormality. No acute or significant osseous findings. Review of the MIP images confirms the above findings. CT ABDOMEN and PELVIS FINDINGS Hepatobiliary: Status post cholecystectomy 2 days ago. No significant fluid collection is noted in the gallbladder fossa. No biliary dilatation is  noted. The liver is otherwise unremarkable. Mild pneumoperitoneum is noted in epigastric region which most likely is postoperative in etiology. Pancreas: Stable pancreatic head calcifications are noted suggesting chronic pancreatitis. No acute inflammation or ductal dilatation is noted. Spleen: Normal in size without focal abnormality. Adrenals/Urinary Tract: Adrenal glands appear normal. Right renal cyst is noted. No hydronephrosis or renal obstruction is noted. Urinary bladder is unremarkable. Stomach/Bowel: The stomach is unremarkable. No small bowel dilatation is noted. Dilatation of the right and transverse colon is noted which most likely represents postoperative ileus. The appendix is not visualized. Vascular/Lymphatic: Aortic atherosclerosis. No enlarged abdominal or pelvic lymph nodes. Reproductive: Stable mild prostatic enlargement is noted. Other: No abdominal wall hernia or abnormality. No abdominopelvic ascites. Musculoskeletal: Multilevel degenerative disc disease is noted in the lower lumbar spine. No acute osseous abnormality is noted. Review of the MIP images confirms the above findings. IMPRESSION: No definite evidence of pulmonary embolus. Coronary artery calcifications are noted. Mild bilateral posterior basilar subsegmental atelectasis is noted. Status post cholecystectomy 2 days ago. No significant fluid collection is noted in the gallbladder fossa. Mild pneumoperitoneum is noted in the epigastric region which most likely is postoperative in etiology. Aortic Atherosclerosis (ICD10-I70.0). Electronically Signed   By: Marijo Conception M.D.   On: 03/25/2019 14:33   Dg Chest Port 1 View  Result Date: 03/25/2019 CLINICAL DATA:  Altered mental status EXAM: PORTABLE CHEST 1 VIEW COMPARISON:  October 29, 2018 FINDINGS: The degree of inspiration is shallow. There is no evident edema or consolidation. There is apparent atelectasis in the medial left base. Heart size and pulmonary vascularity are within  normal limits. No adenopathy. There is aortic atherosclerosis. There is also calcification in the left carotid artery. There is degenerative change in each shoulder. Apparent postoperative change lateral right clavicle, stable. IMPRESSION: Atelectasis medial left base. No edema or consolidation. Heart size within normal limits. There is arthropathy in each shoulder with apparent postoperative change lateral right clavicle. Aortic Atherosclerosis (ICD10-I70.0). There is left carotid artery calcification. Electronically Signed   By: Lowella Grip III M.D.   On: 03/25/2019 10:35     Scheduled Meds:  acetaminophen  1,000 mg Oral TID   aspirin  81 mg Oral Daily   atorvastatin  10 mg Oral Daily   cholecalciferol  2,000 Units Oral QPM   clopidogrel  75 mg Oral Daily   DULoxetine  30 mg Oral QPM   enoxaparin (LOVENOX) injection  40 mg Subcutaneous Q24H   hydrOXYzine  25 mg Oral QHS   levothyroxine  25 mcg Oral Q0600   lip balm  1 application Topical BID   magnesium oxide  400 mg Oral QPM   metoprolol tartrate  75 mg Oral BID   pantoprazole  40 mg Oral Daily   sodium chloride flush  3 mL Intravenous Q12H   Continuous Infusions:  sodium chloride     methocarbamol (ROBAXIN) IV       LOS: 0 days   Time Spent in minutes   45 minutes  Joene Gelder D.O. on 03/26/2019 at 11:48 AM  Between 7am to 7pm - Please see pager noted on [email or web address]  After 7pm go to [email or web address]  And look for the night coverage person covering for me after hours  [facility] Office  [phone]

## 2019-03-26 NOTE — Progress Notes (Signed)
CRITICAL VALUE ALERT Lactic acid Critical Value:  3.2  Date & Time Notied:  03/26/2019  0530  Provider Notified: Silas [place], NP  Orders Received/Actions taken: awaiting for orders, will continue to monitor.

## 2019-03-26 NOTE — Progress Notes (Signed)
Patient hitting, punching and cussing at nursing staff. Patient attempting to get out of bed. Nursing staff giving emotional/physical help to patient. Paged [facility], got orders for haldol x1. Will continue to monitor patient.

## 2019-03-26 NOTE — Progress Notes (Signed)
Subjective: Awake and alert.  Does not remember having gallbladder surgery.  Thinks he is at home.  Speech is reasonably normal.  Does not seem agitated this morning.  Apparently was more confused during the night.  Denies pain nausea or vomiting.  Afebrile this morning.  Hemodynamically stable.  WBC down to 12,800.  Hemoglobin 13.8. CT scan shows no evidence of complication of his gallbladder surgery  Objective: Vital signs in last 24 hours: Temp:  [97.4 F (36.3 C)-100.7 F (38.2 C)] 97.4 F (36.3 C) (05/30 0434) Pulse Rate:  [67-93] 74 (05/30 0434) Resp:  [14-22] 22 (05/30 0434) BP: (133-163)/(73-90) 133/85 (05/30 0434) SpO2:  [88 %-97 %] 93 % (05/30 0434) Weight:  [84.7 kg-86.6 kg] 84.7 kg (05/30 0500) Last BM Date: 03/24/19  Intake/Output from previous day: 05/29 0701 - 05/30 0700 In: -  Out: 0092 [Urine:1675] Intake/Output this shift: No intake/output data recorded.  General appearance: Alert.  Not agitated.  Speech relatively normal.  Disoriented to place and situation. GI: Abdomen is soft.  Nondistended.  Periumbilical wound clean.  Mild, appropriate postsurgical tenderness  Lab Results:  Recent Labs    03/25/19 1851 03/26/19 0723  WBC 17.2* 12.8*  HGB 14.4 13.8  HCT 42.6 40.8  PLT 195 181   BMET Recent Labs    03/25/19 1053 03/25/19 1851 03/26/19 0723  NA 138  --  138  K 3.7  --  3.3*  CL 102  --  99  CO2 23  --  25  GLUCOSE 102*  --  102*  BUN 19  --  15  CREATININE 1.35* 1.22 1.07  CALCIUM 9.1  --  9.2   PT/INR Recent Labs    03/25/19 1053  LABPROT 14.4  INR 1.1   ABG No results for input(s): PHART, HCO3 in the last 72 hours.  Invalid input(s): PCO2, PO2  Studies/Results: Ct Head Wo Contrast  Result Date: 03/25/2019 CLINICAL DATA:  Confusion over the last couple of days EXAM: CT HEAD WITHOUT CONTRAST TECHNIQUE: Contiguous axial images were obtained from the base of the skull through the vertex without intravenous contrast.  COMPARISON:  None. FINDINGS: Brain: No evidence of acute infarction, hemorrhage, extra-axial collection, ventriculomegaly, or mass effect. Generalized cerebral atrophy. Periventricular white matter low attenuation likely secondary to microangiopathy. Vascular: Cerebrovascular atherosclerotic calcifications are noted. Skull: Negative for fracture or focal lesion. Sinuses/Orbits: Visualized portions of the orbits are unremarkable. Mastoid sinuses are clear. Mild left maxillary sinus mucosal thickening. Prior sinus surgery. Other: None. IMPRESSION: No acute intracranial pathology. Electronically Signed   By: Kathreen Devoid   On: 03/25/2019 11:11   Ct Angio Chest Pe W/cm &/or Wo Cm  Result Date: 03/25/2019 CLINICAL DATA:  Elevated D-dimer level. Right upper quadrant pain status post cholecystectomy. EXAM: CT ANGIOGRAPHY CHEST CT ABDOMEN AND PELVIS WITH CONTRAST TECHNIQUE: Multidetector CT imaging of the chest was performed using the standard protocol during bolus administration of intravenous contrast. Multiplanar CT image reconstructions and MIPs were obtained to evaluate the vascular anatomy. Multidetector CT imaging of the abdomen and pelvis was performed using the standard protocol during bolus administration of intravenous contrast. CONTRAST:  148mL OMNIPAQUE IOHEXOL 350 MG/ML SOLN COMPARISON:  CT scan of January 20, 2019. FINDINGS: CTA CHEST FINDINGS Cardiovascular: Satisfactory opacification of the pulmonary arteries to the segmental level. No evidence of pulmonary embolism. Normal heart size. No pericardial effusion. Coronary artery calcifications are noted. Mediastinum/Nodes: No enlarged mediastinal, hilar, or axillary lymph nodes. Thyroid gland, trachea, and esophagus demonstrate no significant findings.  Lungs/Pleura: No pneumothorax is noted. No significant pleural effusion is noted. Mild bilateral posterior basilar subsegmental atelectasis is noted. Musculoskeletal: No chest wall abnormality. No acute or  significant osseous findings. Review of the MIP images confirms the above findings. CT ABDOMEN and PELVIS FINDINGS Hepatobiliary: Status post cholecystectomy 2 days ago. No significant fluid collection is noted in the gallbladder fossa. No biliary dilatation is noted. The liver is otherwise unremarkable. Mild pneumoperitoneum is noted in epigastric region which most likely is postoperative in etiology. Pancreas: Stable pancreatic head calcifications are noted suggesting chronic pancreatitis. No acute inflammation or ductal dilatation is noted. Spleen: Normal in size without focal abnormality. Adrenals/Urinary Tract: Adrenal glands appear normal. Right renal cyst is noted. No hydronephrosis or renal obstruction is noted. Urinary bladder is unremarkable. Stomach/Bowel: The stomach is unremarkable. No small bowel dilatation is noted. Dilatation of the right and transverse colon is noted which most likely represents postoperative ileus. The appendix is not visualized. Vascular/Lymphatic: Aortic atherosclerosis. No enlarged abdominal or pelvic lymph nodes. Reproductive: Stable mild prostatic enlargement is noted. Other: No abdominal wall hernia or abnormality. No abdominopelvic ascites. Musculoskeletal: Multilevel degenerative disc disease is noted in the lower lumbar spine. No acute osseous abnormality is noted. Review of the MIP images confirms the above findings. IMPRESSION: No definite evidence of pulmonary embolus. Coronary artery calcifications are noted. Mild bilateral posterior basilar subsegmental atelectasis is noted. Status post cholecystectomy 2 days ago. No significant fluid collection is noted in the gallbladder fossa. Mild pneumoperitoneum is noted in the epigastric region which most likely is postoperative in etiology. Aortic Atherosclerosis (ICD10-I70.0). Electronically Signed   By: Marijo Conception M.D.   On: 03/25/2019 14:33   Ct Abdomen Pelvis W Contrast  Result Date: 03/25/2019 CLINICAL DATA:   Elevated D-dimer level. Right upper quadrant pain status post cholecystectomy. EXAM: CT ANGIOGRAPHY CHEST CT ABDOMEN AND PELVIS WITH CONTRAST TECHNIQUE: Multidetector CT imaging of the chest was performed using the standard protocol during bolus administration of intravenous contrast. Multiplanar CT image reconstructions and MIPs were obtained to evaluate the vascular anatomy. Multidetector CT imaging of the abdomen and pelvis was performed using the standard protocol during bolus administration of intravenous contrast. CONTRAST:  151mL OMNIPAQUE IOHEXOL 350 MG/ML SOLN COMPARISON:  CT scan of January 20, 2019. FINDINGS: CTA CHEST FINDINGS Cardiovascular: Satisfactory opacification of the pulmonary arteries to the segmental level. No evidence of pulmonary embolism. Normal heart size. No pericardial effusion. Coronary artery calcifications are noted. Mediastinum/Nodes: No enlarged mediastinal, hilar, or axillary lymph nodes. Thyroid gland, trachea, and esophagus demonstrate no significant findings. Lungs/Pleura: No pneumothorax is noted. No significant pleural effusion is noted. Mild bilateral posterior basilar subsegmental atelectasis is noted. Musculoskeletal: No chest wall abnormality. No acute or significant osseous findings. Review of the MIP images confirms the above findings. CT ABDOMEN and PELVIS FINDINGS Hepatobiliary: Status post cholecystectomy 2 days ago. No significant fluid collection is noted in the gallbladder fossa. No biliary dilatation is noted. The liver is otherwise unremarkable. Mild pneumoperitoneum is noted in epigastric region which most likely is postoperative in etiology. Pancreas: Stable pancreatic head calcifications are noted suggesting chronic pancreatitis. No acute inflammation or ductal dilatation is noted. Spleen: Normal in size without focal abnormality. Adrenals/Urinary Tract: Adrenal glands appear normal. Right renal cyst is noted. No hydronephrosis or renal obstruction is noted.  Urinary bladder is unremarkable. Stomach/Bowel: The stomach is unremarkable. No small bowel dilatation is noted. Dilatation of the right and transverse colon is noted which most likely represents postoperative ileus. The appendix  is not visualized. Vascular/Lymphatic: Aortic atherosclerosis. No enlarged abdominal or pelvic lymph nodes. Reproductive: Stable mild prostatic enlargement is noted. Other: No abdominal wall hernia or abnormality. No abdominopelvic ascites. Musculoskeletal: Multilevel degenerative disc disease is noted in the lower lumbar spine. No acute osseous abnormality is noted. Review of the MIP images confirms the above findings. IMPRESSION: No definite evidence of pulmonary embolus. Coronary artery calcifications are noted. Mild bilateral posterior basilar subsegmental atelectasis is noted. Status post cholecystectomy 2 days ago. No significant fluid collection is noted in the gallbladder fossa. Mild pneumoperitoneum is noted in the epigastric region which most likely is postoperative in etiology. Aortic Atherosclerosis (ICD10-I70.0). Electronically Signed   By: Marijo Conception M.D.   On: 03/25/2019 14:33   Dg Chest Port 1 View  Result Date: 03/25/2019 CLINICAL DATA:  Altered mental status EXAM: PORTABLE CHEST 1 VIEW COMPARISON:  October 29, 2018 FINDINGS: The degree of inspiration is shallow. There is no evident edema or consolidation. There is apparent atelectasis in the medial left base. Heart size and pulmonary vascularity are within normal limits. No adenopathy. There is aortic atherosclerosis. There is also calcification in the left carotid artery. There is degenerative change in each shoulder. Apparent postoperative change lateral right clavicle, stable. IMPRESSION: Atelectasis medial left base. No edema or consolidation. Heart size within normal limits. There is arthropathy in each shoulder with apparent postoperative change lateral right clavicle. Aortic Atherosclerosis (ICD10-I70.0).  There is left carotid artery calcification. Electronically Signed   By: Lowella Grip III M.D.   On: 03/25/2019 10:35    Anti-infectives: Anti-infectives (From admission, onward)   None      Assessment/Plan:  Status post laparoscopic cholecystectomy 03/23/2019 by Dr. Johney Maine -No apparent surgical complications to the -Lab and CT abdomen pelvis unremarkable -Physical exam unremarkable for postop status  Altered mental status.  Etiology unclear. -Apparently no CVA by CT scan -Question metabolic encephalopathy.  Work-up in progress  Leukocytosis.  Etiology and significance unclear.  Resolving Elevation in BNP and troponin   Recommendations. -Advance diet as tolerated -Okay to involve PT and OT if therapies desired -Okay for chemical VTE prophylaxis from surgical perspective -No antibiotics indicated from surgical point of view   Surgery will sign off.  Please reconsult if surgical problems arise   Edsel Petrin. Dalbert Batman, M.D., [facility] General and Minimally invasive Surgery Breast and Colorectal Surgery Office:   [phone]     LOS: 0 days    [name] 03/26/2019

## 2019-03-26 NOTE — Progress Notes (Signed)
Patient having increased confusion. Patient trying to get out of bed multiple times, stating wanting to go home, pulling on condom cath, and yelling at staff. Called daughter Amy, like patient requested. Amy very concerned about patients mental status. She stated that her father never acts likes. Gave emotional support to patient/family member. Bed at lowest position, bed alarm on, and mats down. Paged [facility], got orders for haldol x1. Will continue to monitor patient.

## 2019-03-27 LAB — BASIC METABOLIC PANEL
Anion gap: 11 (ref 5–15)
BUN: 14 mg/dL (ref 8–23)
CO2: 25 mmol/L (ref 22–32)
Calcium: 9.2 mg/dL (ref 8.9–10.3)
Chloride: 101 mmol/L (ref 98–111)
Creatinine, Ser: 0.95 mg/dL (ref 0.61–1.24)
GFR calc Af Amer: >60 mL/min (ref >60)
GFR calc non Af Amer: >60 mL/min (ref >60)
Glucose, Bld: 112 mg/dL — ABNORMAL HIGH (ref 70–99)
Potassium: 3.5 mmol/L (ref 3.5–5.1)
Sodium: 137 mmol/L (ref 135–145)

## 2019-03-27 LAB — LACTIC ACID, PLASMA: Lactic Acid, Venous: 1 mmol/L (ref 0.5–1.9)

## 2019-03-27 MED ORDER — FUROSEMIDE 10 MG/ML IJ SOLN
20.0000 mg | Freq: Once | INTRAMUSCULAR | Status: AC
  Administered 2019-03-27: 20 mg via INTRAVENOUS
  Filled 2019-03-27: qty 2

## 2019-03-27 MED ORDER — FUROSEMIDE 20 MG PO TABS: 20.0000 mg | ORAL_TABLET | Freq: Every day | ORAL | 0 refills | Status: AC | PRN

## 2019-03-27 NOTE — Discharge Instructions (Signed)
Confusion °Confusion is the inability to think with the usual speed or clarity. People who are confused often describe their thinking as cloudy or unclear. Confusion can also include feeling disoriented. This means you are unaware of where you are or who you are. You Cuthbertson also not know the date or time. When confused, you Grimley have difficulty remembering, paying attention, or making decisions. Some people also act aggressively when they are confused. °In some cases, confusion Wooden come on quickly. In other cases, it Kennerly develop slowly over time. How quickly confusion comes on depends on the cause. °Confusion Salvo be caused by: °· Head injury (concussion). °· Seizures. °· Stroke. °· Fever. °· Brain tumor. °· Decrease in brain function due to a vascular or neurologic condition (dementia). °· Emotions, like rage or terror. °· Inability to know what is real and what is not (hallucinations). °· Infections, such as a urinary tract infection (UTI). °· Using too much alcohol, drugs, or medicines. °· Loss of fluid (dehydration) or an imbalance of salts in the body (electrolytes). °· Lack of sleep. °· Low blood sugar (diabetes). °· Low levels of oxygen. This comes from conditions such as chronic lung disorders. °· Side effects of medicines, or taking medicines that affect other medicines (drug interactions). °· Lack of certain nutrients, especially niacin, thiamine, vitamin C, or vitamin B. °· Sudden drop in body temperature (hypothermia). °· Change in routine, such as traveling or being hospitalized. °Follow these instructions at home: °Pay attention to your symptoms. Tell your health care provider about any changes or if you develop new symptoms. Follow these instructions to control or treat symptoms. Ask a family member or friend for help if needed. °Medicines °· Take over-the-counter and prescription medicines only as told by your health care provider. °· Ask your health care provider about changing or stopping any medicines  that Mackert be causing your confusion. °· Avoid pain medicines or sleep medicines until you have fully recovered. °· Use a pillbox or an alarm to help you take the right medicines at the right time. °Lifestyle ° °· Eat a balanced diet that includes fruits and vegetables. °· Get enough sleep. For most adults, this is 7-9 hours each night. °· Do not drink alcohol. °· Do not become isolated. Spend time with other people and make plans for your days. °· Do not drive until your health care provider says that it is safe to do so. °· Do not use any products that contain nicotine or tobacco, such as cigarettes and e-cigarettes. If you need help quitting, ask your health care provider. °· Stop other activities that Michelini increase your chances of getting hurt. These Kim include some work duties, sports activities, swimming, or bike riding. Ask your health care provider what activities are safe for you. °What caregivers can do °· Find out if the person is confused. Ask the person to state his or her name, age, and the date. If the person is unsure or answers incorrectly, he or she Forstrom be confused. °· Always introduce yourself, no matter how well the person knows you. °· Remind the person of his or her location. Do this often. °· Place a calendar and clock near the person who is confused. °· Talk about current events and plans for the day. °· Keep the environment calm, quiet, and peaceful. °· Help the person do the things that he or she is unable to do. These include: °? Taking medicines. °? Keeping follow-up visits with his or her health care   provider. °? Helping with household duties, including meal preparation. °? Running errands. °· Get help if you need it. There are several support groups for caregivers. °· If the person you are helping needs more support, consider day care, extended care programs, or a skilled nursing facility. The person's health care provider Dhanani be able to help evaluate these options. °General  instructions °· Monitor yourself for any conditions you Buchinger have. These Mendonsa include: °? Checking your blood glucose levels, if you have diabetes. °? Watching your weight, if you are overweight. °? Monitoring your blood pressure, if you have hypertension. °? Monitoring your body temperature, if you have a fever. °· Keep all follow-up visits as told by your health care provider. This is important. °Contact a health care provider if: °· Your symptoms get worse. °Get help right away if you: °· Feel that you are not able to care for yourself. °· Develop severe headaches, repeated vomiting, seizures, blackouts, or slurred speech. °· Have increasing confusion, weakness, numbness, restlessness, or personality changes. °· Develop a loss of balance, have marked dizziness, feel uncoordinated, or fall. °· Develop severe anxiety, or you have delusions or hallucinations. °These symptoms Verdi represent a serious problem that is an emergency. Do not wait to see if the symptoms will go away. Get medical help right away. Call your local emergency services (911 in the [place] Do not drive yourself to the hospital. °Summary °· Confusion is the inability to think with the usual speed or clarity. People who are confused often describe their thinking as cloudy or unclear. °· Confusion can also include having difficulty remembering, paying attention, or making decisions. °· Confusion Sollenberger come on quickly or develop slowly over time, depending on the cause. There are many different causes of confusion. °· Ask for help from family members or friends if you are unable to take care of yourself. °This information is not intended to replace advice given to you by your health care provider. Make sure you discuss any questions you have with your health care provider. °Document Released: 11/20/2004 Document Revised: 10/15/2017 Document Reviewed: 10/15/2017 °Elsevier Interactive Patient Education © 2019 [employer] ° °

## 2019-03-27 NOTE — Discharge Summary (Signed)
Physician Discharge Summary  Corday M Forgione WIO:[id number] DOB: 05-23-1941 DOA: 03/25/2019  PCP: Burnard Bunting, MD  Admit date: 03/25/2019 Discharge date: 03/28/2019  Time spent: 45 minutes  Recommendations for Outpatient Follow-up:  Patient will be discharged to home with home health first program.  Patient will need to follow up with primary care provider within one week of discharge.  Follow up with cardiology. Patient should continue medications as prescribed.  Patient should follow a heart healthy diet.   Discharge Diagnoses:  Acute metabolic encephalopathy Hypoxia Elevated troponin Elevated BNP SIRS AKI versus CKD, stage III Recent laparoscopic cholecystectomy Depression GERD Essential hypertension Hypokalemia  Discharge Condition: Stable  Diet recommendation: heart healthy  Filed Weights   03/25/19 0912 03/26/19 0500 03/27/19 0457  Weight: 86.6 kg 84.7 kg 83.6 kg    History of present illness:  On 03/25/2019  Arsen Mangione Mayis a 78 y.o.malewith a medical history ofcoronary artery disease, hypertension, hypothyroidism, recently admitted and discharged on 03/24/2019 for laparoscopic cholecystectomy. Patient presented today to the emergency department for confusion. Seems the patient was brought to the emergency department as he has had confusion of the past several days. However per discharge summary on 03/24/2019, he was confused at that time. Unknown baseline. Patient does currently endorse shortness of breath more with movement. He denies current chest pain, abdominal pain, nausea or vomiting, diarrhea or constipation, dizziness or headache, problems with urination, recent illness or travel. Does state that he has to get to the station and go home.  Hospital Course:  Acute metabolic encephalopathy -Resolved- patient currently alert and oriented x4 -Secondary to hypoxia versus recent surgery and anesthesia versus medications -CT head showed no acute intracranial  pathology -Not sure of patient's baseline however per discharge summary done on 03/24/2019, patient noted to have some mild memory loss and confusion but able to be oriented. -Discussed with daughter today, she states that he normally is AAOx3, no history of dementia or memory issues. States when she picked him up on 5/28, he was fine, however during the night he became confused and agitated.  -Obtained TSH 3.504, B12 level: 522, Folate 8.2 (WNL), ammonia 29 -Currently no infectious etiology  Hypoxia -Noted to have an SPO2 of 88% on admission -was placed on supplemental oxygen to maintain saturations above 92% -Continue incentive spirometry -Chest x-ray unremarkable for infection, although some atelectasis on the left -CTA chest unremarkable for PE -Possibly secondary to excess volume received during recent hospitalization and surgery -Weaned off of oxygen, currently on room air and maintaining oxygen saturations in the 90s  Elevated troponin -Patient currently denies chest pain -EKG reviewed, no changes from prior EKG -Continue to cycle troponin- peaked at 0.19 and trending downward -Patient with history of CAD, continue, aspirin, statin, metoprolol -Of note, patient did have cardiac catheterization on 11/05/2018 showed nonobstructive CAD. Plan was to continue medical therapy and obtain echocardiogram as an outpatient however no echocardiogram noted in chart  Elevated BNP, Diastolic dysfunction -Patient does not overtly appear to be hypervolemic onexam -BNP 196.3 -Echocardiogram EF 50-55%, Mild hypokinesis of the anterior wall. LV diastolic Doppler parameters consistent with impaired relaxation.   -CXR does not show pulmonary edema -Monitor intake and output, daily weights -No history of CHF -was given IV lasix -discussed with daughter, will discharge patient with lasix 20mg  PRN for weight gain, SOB- patient will need to follow up with Dr. Martinique, cardiology  SIRS -Patient with  fever of 100.6, leukocytosis- both improving  -UA and chest x-ray unremarkable for infection -CT  and pelvis showed no significant fluid collection in the gallbladder fossa. Mild pneumoperitoneum in the epigastric region likely postoperative in etiology -resolved  -Surgery consulted and appreciated- no recommendations  AKI versus CKD, stage III -Creatinineon admission 1.35, although has been in stage III since January 2020 -Creatinine 0.95 -repeat BMP in one week  Recent laparoscopic cholecystectomy -Above, general surgery consulted and appreciated- have signed off   Depression -Continue Cymbalta  GERD -Continue PPI  Essential hypertension -Held lisinopril- resume on discharge -BP currently stable  -Continue metoprolol  Hypokalemia -likely secondary to lasix -resolved with replacement -repeat BMP in one week  Procedures: Echocardiogram  Consultations: None  Discharge Exam: Vitals:   03/27/19 0749 03/27/19 1300  BP: (!) 162/79 (!) 143/78  Pulse: 63 91  Resp: 17 20  Temp:  98.1 F (36.7 C)  SpO2: 92% 93%     General: Well developed, well nourished, NAD, appears stated age  HEENT: NCAT, mucous membranes moist.  Neck: Supple  Cardiovascular: S1 S2 auscultated, RRR  Respiratory: Clear to auscultation bilaterally with equal chest rise  Abdomen: Soft, nontender, nondistended, + bowel sounds  Extremities: warm dry without cyanosis clubbing or edema  Neuro: AAOx3, nonfocal  Psych: Normal affect and demeanor, pleasant   Discharge Instructions Discharge Instructions    Discharge instructions   Complete by:  As directed    Patient will be discharged to home with home health first program.  Patient will need to follow up with primary care provider within one week of discharge.  Follow up with cardiology. Patient should continue medications as prescribed.  Patient should follow a heart healthy diet.     Allergies as of 03/27/2019   No Known  Allergies     Medication List    TAKE these medications   acetaminophen 650 MG CR tablet Commonly known as:  TYLENOL Take 1,300 mg by mouth 2 (two) times daily.   Anoro Ellipta 62.5-25 MCG/INH Aepb Generic drug:  umeclidinium-vilanterol Take 1 puff by mouth daily as needed (shortness of breath).   aspirin 81 MG tablet Take 81 mg by mouth daily.   atorvastatin 10 MG tablet Commonly known as:  LIPITOR Take 1 tablet (10 mg total) by mouth daily.   clopidogrel 75 MG tablet Commonly known as:  PLAVIX TAKE 1 TABLET BY MOUTH EVERY DAY   diclofenac sodium 1 % Gel Commonly known as:  Voltaren Apply 2 g topically 4 (four) times daily. What changed:    when to take this  reasons to take this   DULoxetine 30 MG capsule Commonly known as:  CYMBALTA Take 30 mg by mouth every evening.   furosemide 20 MG tablet Commonly known as:  Lasix Take 1 tablet (20 mg total) by mouth daily as needed for fluid or edema (or SOB or weight gain).   hydrOXYzine 25 MG tablet Commonly known as:  ATARAX/VISTARIL Take 25 mg by mouth at bedtime.   levothyroxine 25 MCG tablet Commonly known as:  SYNTHROID Take 25 mcg by mouth daily.   lisinopril 20 MG tablet Commonly known as:  ZESTRIL Take 1 tablet (20 mg total) by mouth daily.   loperamide 2 MG tablet Commonly known as:  IMODIUM A-D Take 2-4 mg by mouth as needed for diarrhea or loose stools.   Magnesium 400 MG Caps Take 400 mg by mouth every evening.   meclizine 25 MG tablet Commonly known as:  ANTIVERT Take 25 mg by mouth 2 (two) times daily as needed for dizziness or nausea.  metoprolol tartrate 50 MG tablet Commonly known as:  LOPRESSOR Take 1.5 tablets (75 mg total) by mouth 2 (two) times daily.   MOVE FREE JOINT HEALTH ADVANCE PO Take 1 capsule by mouth 2 (two) times daily.   nitroGLYCERIN 0.4 MG SL tablet Commonly known as:  Nitrostat Place 1 tablet (0.4 mg total) under the tongue every 5 (five) minutes as needed for  chest pain.   ondansetron 4 MG tablet Commonly known as:  ZOFRAN Take 1 tablet (4 mg total) by mouth every 8 (eight) hours as needed for nausea or vomiting.   pantoprazole 40 MG tablet Commonly known as:  PROTONIX Take 1 tablet (40 mg total) by mouth daily. NEED OV.   senna 8.6 MG Tabs tablet Commonly known as:  SENOKOT Take 1 tablet by mouth daily as needed for mild constipation.   traMADol 50 MG tablet Commonly known as:  ULTRAM Take 1-2 tablets (50-100 mg total) by mouth every 6 (six) hours as needed for moderate pain or severe pain.   VITAMIN B-12 IJ Inject 1 Dose as directed every 30 (thirty) days.   Vitamin D3 50 MCG (2000 UT) capsule Take 2,000 Units by mouth every evening.      No Known Allergies Follow-up Information    Burnard Bunting, MD. Schedule an appointment as soon as possible for a visit in 1 week(s).   Specialty:  Internal Medicine Why:  Hospital follow up Contact information: [place] [address] [phone]        Martinique, Peter M, MD .   Specialty:  Cardiology Contact information: [address] [place] [address] [phone]            The results of significant diagnostics from this hospitalization (including imaging, microbiology, ancillary and laboratory) are listed below for reference.    Significant Diagnostic Studies: Dg Cholangiogram Operative  Result Date: 03/24/2019 CLINICAL DATA:  Gallstones EXAM: INTRAOPERATIVE CHOLANGIOGRAM TECHNIQUE: Cholangiographic images from the C-arm fluoroscopic device were submitted for interpretation post-operatively. Please see the procedural report for the amount of contrast and the fluoroscopy time utilized. COMPARISON:  None. FINDINGS: Contrast fills the biliary tree and duodenum without filling defects in the common bile duct. IMPRESSION: Patent biliary tree. Electronically Signed   By: Marybelle Killings M.D.   On: 03/24/2019 07:25   Ct Head Wo Contrast  Result  Date: 03/25/2019 CLINICAL DATA:  Confusion over the last couple of days EXAM: CT HEAD WITHOUT CONTRAST TECHNIQUE: Contiguous axial images were obtained from the base of the skull through the vertex without intravenous contrast. COMPARISON:  None. FINDINGS: Brain: No evidence of acute infarction, hemorrhage, extra-axial collection, ventriculomegaly, or mass effect. Generalized cerebral atrophy. Periventricular white matter low attenuation likely secondary to microangiopathy. Vascular: Cerebrovascular atherosclerotic calcifications are noted. Skull: Negative for fracture or focal lesion. Sinuses/Orbits: Visualized portions of the orbits are unremarkable. Mastoid sinuses are clear. Mild left maxillary sinus mucosal thickening. Prior sinus surgery. Other: None. IMPRESSION: No acute intracranial pathology. Electronically Signed   By: Kathreen Devoid   On: 03/25/2019 11:11   Ct Angio Chest Pe W/cm &/or Wo Cm  Result Date: 03/25/2019 CLINICAL DATA:  Elevated D-dimer level. Right upper quadrant pain status post cholecystectomy. EXAM: CT ANGIOGRAPHY CHEST CT ABDOMEN AND PELVIS WITH CONTRAST TECHNIQUE: Multidetector CT imaging of the chest was performed using the standard protocol during bolus administration of intravenous contrast. Multiplanar CT image reconstructions and MIPs were obtained to evaluate the vascular anatomy. Multidetector CT imaging of the abdomen and  pelvis was performed using the standard protocol during bolus administration of intravenous contrast. CONTRAST:  17mL OMNIPAQUE IOHEXOL 350 MG/ML SOLN COMPARISON:  CT scan of January 20, 2019. FINDINGS: CTA CHEST FINDINGS Cardiovascular: Satisfactory opacification of the pulmonary arteries to the segmental level. No evidence of pulmonary embolism. Normal heart size. No pericardial effusion. Coronary artery calcifications are noted. Mediastinum/Nodes: No enlarged mediastinal, hilar, or axillary lymph nodes. Thyroid gland, trachea, and esophagus demonstrate no  significant findings. Lungs/Pleura: No pneumothorax is noted. No significant pleural effusion is noted. Mild bilateral posterior basilar subsegmental atelectasis is noted. Musculoskeletal: No chest wall abnormality. No acute or significant osseous findings. Review of the MIP images confirms the above findings. CT ABDOMEN and PELVIS FINDINGS Hepatobiliary: Status post cholecystectomy 2 days ago. No significant fluid collection is noted in the gallbladder fossa. No biliary dilatation is noted. The liver is otherwise unremarkable. Mild pneumoperitoneum is noted in epigastric region which most likely is postoperative in etiology. Pancreas: Stable pancreatic head calcifications are noted suggesting chronic pancreatitis. No acute inflammation or ductal dilatation is noted. Spleen: Normal in size without focal abnormality. Adrenals/Urinary Tract: Adrenal glands appear normal. Right renal cyst is noted. No hydronephrosis or renal obstruction is noted. Urinary bladder is unremarkable. Stomach/Bowel: The stomach is unremarkable. No small bowel dilatation is noted. Dilatation of the right and transverse colon is noted which most likely represents postoperative ileus. The appendix is not visualized. Vascular/Lymphatic: Aortic atherosclerosis. No enlarged abdominal or pelvic lymph nodes. Reproductive: Stable mild prostatic enlargement is noted. Other: No abdominal wall hernia or abnormality. No abdominopelvic ascites. Musculoskeletal: Multilevel degenerative disc disease is noted in the lower lumbar spine. No acute osseous abnormality is noted. Review of the MIP images confirms the above findings. IMPRESSION: No definite evidence of pulmonary embolus. Coronary artery calcifications are noted. Mild bilateral posterior basilar subsegmental atelectasis is noted. Status post cholecystectomy 2 days ago. No significant fluid collection is noted in the gallbladder fossa. Mild pneumoperitoneum is noted in the epigastric region which  most likely is postoperative in etiology. Aortic Atherosclerosis (ICD10-I70.0). Electronically Signed   By: Marijo Conception M.D.   On: 03/25/2019 14:33   Ct Abdomen Pelvis W Contrast  Result Date: 03/25/2019 CLINICAL DATA:  Elevated D-dimer level. Right upper quadrant pain status post cholecystectomy. EXAM: CT ANGIOGRAPHY CHEST CT ABDOMEN AND PELVIS WITH CONTRAST TECHNIQUE: Multidetector CT imaging of the chest was performed using the standard protocol during bolus administration of intravenous contrast. Multiplanar CT image reconstructions and MIPs were obtained to evaluate the vascular anatomy. Multidetector CT imaging of the abdomen and pelvis was performed using the standard protocol during bolus administration of intravenous contrast. CONTRAST:  11mL OMNIPAQUE IOHEXOL 350 MG/ML SOLN COMPARISON:  CT scan of January 20, 2019. FINDINGS: CTA CHEST FINDINGS Cardiovascular: Satisfactory opacification of the pulmonary arteries to the segmental level. No evidence of pulmonary embolism. Normal heart size. No pericardial effusion. Coronary artery calcifications are noted. Mediastinum/Nodes: No enlarged mediastinal, hilar, or axillary lymph nodes. Thyroid gland, trachea, and esophagus demonstrate no significant findings. Lungs/Pleura: No pneumothorax is noted. No significant pleural effusion is noted. Mild bilateral posterior basilar subsegmental atelectasis is noted. Musculoskeletal: No chest wall abnormality. No acute or significant osseous findings. Review of the MIP images confirms the above findings. CT ABDOMEN and PELVIS FINDINGS Hepatobiliary: Status post cholecystectomy 2 days ago. No significant fluid collection is noted in the gallbladder fossa. No biliary dilatation is noted. The liver is otherwise unremarkable. Mild pneumoperitoneum is noted in epigastric region which most likely is postoperative  in etiology. Pancreas: Stable pancreatic head calcifications are noted suggesting chronic pancreatitis. No acute  inflammation or ductal dilatation is noted. Spleen: Normal in size without focal abnormality. Adrenals/Urinary Tract: Adrenal glands appear normal. Right renal cyst is noted. No hydronephrosis or renal obstruction is noted. Urinary bladder is unremarkable. Stomach/Bowel: The stomach is unremarkable. No small bowel dilatation is noted. Dilatation of the right and transverse colon is noted which most likely represents postoperative ileus. The appendix is not visualized. Vascular/Lymphatic: Aortic atherosclerosis. No enlarged abdominal or pelvic lymph nodes. Reproductive: Stable mild prostatic enlargement is noted. Other: No abdominal wall hernia or abnormality. No abdominopelvic ascites. Musculoskeletal: Multilevel degenerative disc disease is noted in the lower lumbar spine. No acute osseous abnormality is noted. Review of the MIP images confirms the above findings. IMPRESSION: No definite evidence of pulmonary embolus. Coronary artery calcifications are noted. Mild bilateral posterior basilar subsegmental atelectasis is noted. Status post cholecystectomy 2 days ago. No significant fluid collection is noted in the gallbladder fossa. Mild pneumoperitoneum is noted in the epigastric region which most likely is postoperative in etiology. Aortic Atherosclerosis (ICD10-I70.0). Electronically Signed   By: Marijo Conception M.D.   On: 03/25/2019 14:33   Dg Chest Port 1 View  Result Date: 03/25/2019 CLINICAL DATA:  Altered mental status EXAM: PORTABLE CHEST 1 VIEW COMPARISON:  October 29, 2018 FINDINGS: The degree of inspiration is shallow. There is no evident edema or consolidation. There is apparent atelectasis in the medial left base. Heart size and pulmonary vascularity are within normal limits. No adenopathy. There is aortic atherosclerosis. There is also calcification in the left carotid artery. There is degenerative change in each shoulder. Apparent postoperative change lateral right clavicle, stable. IMPRESSION:  Atelectasis medial left base. No edema or consolidation. Heart size within normal limits. There is arthropathy in each shoulder with apparent postoperative change lateral right clavicle. Aortic Atherosclerosis (ICD10-I70.0). There is left carotid artery calcification. Electronically Signed   By: Lowella Grip III M.D.   On: 03/25/2019 10:35    Microbiology: Recent Results (from the past 240 hour(s))  Novel Coronavirus, NAA (hospital order; send-out to ref lab)     Status: None   Collection Time: 03/18/19 12:50 PM  Result Value Ref Range Status   SARS-CoV-2, NAA NOT DETECTED NOT DETECTED Final    Comment: (NOTE) Testing was performed using the cobas(R) SARS-CoV-2 test. This test was developed and its performance characteristics determined by [employer]. This test has not been FDA cleared or approved. This test has been authorized by FDA under an Emergency Use Authorization (EUA). This test is only authorized for the duration of time the declaration that circumstances exist justifying the authorization of the emergency use of in vitro diagnostic tests for detection of SARS-CoV-2 virus and/or diagnosis of COVID-19 infection under section 564(b)(1) of the Act, 21 U.S.C. 921JHE-1(D)(4), unless the authorization is terminated or revoked sooner. When diagnostic testing is negative, the possibility of a false negative result should be considered in the context of a patient's recent exposures and the presence of clinical signs and symptoms consistent with COVID-19. An individual without symptoms of COVID-19 and who is not shedding SARS-CoV-2 virus would expect to have  a negative (not detected) result in this assay. Performed At: [facility] [address] [place], [place] [id number] Rush Farmer MD UD:[id number]    [place]  Final    Comment: Performed at [facility], [place] [address] [place], [place] [address]  SARS Coronavirus 2  (CEPHEID-  Performed in [facility] hospital lab), Hosp Order     Status: None   Collection Time: 03/25/19 10:03 AM  Result Value Ref Range Status   SARS Coronavirus 2 NEGATIVE NEGATIVE Final    Comment: (NOTE) If result is NEGATIVE SARS-CoV-2 target nucleic acids are NOT DETECTED. The SARS-CoV-2 RNA is generally detectable in upper and lower  respiratory specimens during the acute phase of infection. The lowest  concentration of SARS-CoV-2 viral copies this assay can detect is 250  copies / mL. A negative result does not preclude SARS-CoV-2 infection  and should not be used as the sole basis for treatment or other  patient management decisions.  A negative result [place] occur with  improper specimen collection / handling, submission of specimen other  than nasopharyngeal swab, presence of viral mutation(s) within the  areas targeted by this assay, and inadequate number of viral copies  (<250 copies / mL). A negative result must be combined with clinical  observations, patient history, and epidemiological information. If result is POSITIVE SARS-CoV-2 target nucleic acids are DETECTED. The SARS-CoV-2 RNA is generally detectable in upper and lower  respiratory specimens dur ing the acute phase of infection.  Positive  results are indicative of active infection with SARS-CoV-2.  Clinical  correlation with patient history and other diagnostic information is  necessary to determine patient infection status.  Positive results do  not rule out bacterial infection or co-infection with other viruses. If result is PRESUMPTIVE POSTIVE SARS-CoV-2 nucleic acids [place] BE PRESENT.   A presumptive positive result was obtained on the submitted specimen  and confirmed on repeat testing.  While 2019 novel coronavirus  (SARS-CoV-2) nucleic acids [place] be present in the submitted sample  additional confirmatory testing [place] be necessary for epidemiological  and / or clinical management purposes  to differentiate  between  SARS-CoV-2 and other Sarbecovirus currently known to infect humans.  If clinically indicated additional testing with an alternate test  methodology [phone]) is advised. The SARS-CoV-2 RNA is generally  detectable in upper and lower respiratory sp ecimens during the acute  phase of infection. The expected result is Negative. Fact Sheet for Patients:  StrictlyIdeas.no Fact Sheet for Healthcare Providers: BankingDealers.co.za This test is not yet approved or cleared by the [place] FDA and has been authorized for detection and/or diagnosis of SARS-CoV-2 by FDA under an Emergency Use Authorization (EUA).  This EUA will remain in effect (meaning this test can be used) for the duration of the COVID-19 declaration under Section 564(b)(1) of the Act, 21 U.S.C. section 360bbb-3(b)(1), unless the authorization is terminated or revoked sooner. Performed at [facility], [place] [address], [place], [place] [address]   Culture, blood (routine x 2)     Status: None (Preliminary result)   Collection Time: 03/25/19 10:07 AM  Result Value Ref Range Status   Specimen Description BLOOD BLOOD RIGHT HAND  Final   Special Requests   Final    BOTTLES DRAWN AEROBIC AND ANAEROBIC Blood Culture results [place] not be optimal due to an inadequate volume of blood received in culture bottles   Culture   Final    NO GROWTH 3 DAYS Performed at [facility], [place] [address], [place], [place] [address]    Report Status PENDING  Incomplete  Culture, blood (routine x 2)     Status: None (Preliminary result)   Collection Time: 03/25/19 10:39 AM  Result Value Ref Range Status   Specimen Description BLOOD LEFT ANTECUBITAL  Final  Special Requests   Final    BOTTLES DRAWN AEROBIC AND ANAEROBIC Blood Culture adequate volume   Culture   Final    NO GROWTH 3 DAYS Performed at [facility], [place] [address], [place], [place] [address]    Report  Status PENDING  Incomplete     Labs: Basic Metabolic Panel: Recent Labs  Lab 03/25/19 1053 03/25/19 1851 03/26/19 0723 03/26/19 1225 03/27/19 0559  NA 138  --  138  --  137  K 3.7  --  3.3*  --  3.5  CL 102  --  99  --  101  CO2 23  --  25  --  25  GLUCOSE 102*  --  102*  --  112*  BUN 19  --  15  --  14  CREATININE 1.35* 1.22 1.07  --  0.95  CALCIUM 9.1  --  9.2  --  9.2  MG  --   --   --  1.9  --    Liver Function Tests: Recent Labs  Lab 03/25/19 1053  AST 47*  ALT 36  ALKPHOS 45  BILITOT 1.0  PROT 6.2*  ALBUMIN 3.5   No results for input(s): LIPASE, AMYLASE in the last 168 hours. Recent Labs  Lab 03/25/19 1036 03/26/19 1225  AMMONIA 10 29   CBC: Recent Labs  Lab 03/25/19 1053 03/25/19 1851 03/26/19 0723  WBC 16.1* 17.2* 12.8*  NEUTROABS 13.5*  --   --   HGB 14.3 14.4 13.8  HCT 44.1 42.6 40.8  MCV 94.8 91.8 91.3  PLT 219 195 181   Cardiac Enzymes: Recent Labs  Lab 03/25/19 1053 03/25/19 1851 03/26/19 0723  TROPONINI 0.19* 0.16* 0.12*   BNP: BNP (last 3 results) Recent Labs    10/29/18 1413 03/25/19 1055  BNP 56.0 196.3*    ProBNP (last 3 results) No results for input(s): PROBNP in the last 8760 hours.  CBG: No results for input(s): GLUCAP in the last 168 hours.     Signed:  Cristal Ford  [facility] 03/28/2019, 11:52 AM

## 2019-03-27 NOTE — Progress Notes (Signed)
Pt stable, dc home via wheelchair, RN go over meds with patient and his daughters, Warren Lacy and Mosetta Putt accopanied pt to entrance.

## 2019-03-27 NOTE — TOC Transition Note (Signed)
Transition of Care [facility]) - CM/SW Discharge Note   Patient Details  Name: Johnny [name] MRN: [id number] Date of Birth: 08-Sep-1941  [facility] of [facility] CM/SW Contact:  Claudie Leach, RN Phone Number: 03/27/2019, 4:52 PM   Clinical Narrative:    Pt readmitted after onset of hypoxia and confusion.  Pt was set up with [facility] home first and had received a wheelchair from last admission.  Alvis Lemmings had not started services as of yet when patient returned to hospital.    D/W daughter, Amy, at length.  She would like for patient to be evaluated for CIR.  She would like for patient to receive intensive therapy but does not want him to go to SNF due to Daphne.  CIR is not likely to accept patient due to lack of amenable diagnosis.  Family had arranged for [facility] to provide HHA 3-7pm in addition to other skilled services.  There are other personal care aides in the home at other times during the day and patient's wife has hospice services.    Tommi Rumps is aware that patient is discharging today and can start services tomorrow.           Patient Goals and CMS Choice   CMS Medicare.gov Compare Post Acute Care list provided to:: Patient Represenative (must comment)(Daughter Amy) Choice offered to / list presented to : Adult Children   Discharge Plan and Services    HH Arranged: RN, PT, Nurse's Aide, OT [facility] Agency: [place] Date [facility] Agency Contacted: 03/27/19 Time HH Agency Contacted: 1500 Representative spoke with at [place]: Tommi Rumps

## 2019-03-27 NOTE — Progress Notes (Signed)
Patient has not had any urine output since I and O cath per day shift RN around 1900.  Bladder scan at this time 25mL.  [facility] with this information.

## 2019-03-27 NOTE — Evaluation (Signed)
Physical Therapy Evaluation Patient Details Name: Johnny [name] MRN: [id number] DOB: 06/03/1941 Today's Date: 03/27/2019   History of Present Illness  [place] a 77 y.o.malewith a medical history ofcoronary artery disease, hypertension, hypothyroidism, recently admitted and discharged on 03/24/2019 for laparoscopic cholecystectomy. Patient presented 5/30 to the emergency department for confusion. Seems the patient was brought to the emergency department as he has had confusion of the past several days.   Clinical Impression   Pt admitted with above diagnosis. Pt currently with functional limitations due to the deficits listed below (see PT Problem List). Came to hospital with confusion and difficulty managing after recent hospital admission; Presents with decr functional mobility, incr fall risk, weakness; Was initiating [name] [place] [facility] at home before this admission; Per Johnny [name], Case Mgr, Johnny [name] daughter is interested in him going to CIR for intensive rehabilitation -- I believe he does have good rehab potential, and this IS worth consideration -- will ask CIR Admissions Coord to weigh in; If CIR isn't an option, the family does not want SNF for Johnny [name], and in that case, I continue to recommend Home First program;   Pt will benefit from skilled PT to increase their independence and safety with mobility to allow discharge to the venue listed below.       Follow Up Recommendations CIR;Other (comment)(versus home with 24 hour Johnny [name])    Equipment Recommendations  Rolling walker with 5" wheels;3in1 (PT);Wheelchair (measurements PT);Wheelchair cushion (measurements PT);Other (comment)(I believe he already has)    Recommendations for Other Services OT consult(if here for much longer)     Precautions / Restrictions Precautions Precautions: Fall      Mobility  Bed Mobility Overal bed mobility: Needs Assistance Bed Mobility: Supine to Sit     Supine to  sit: Mod assist;HOB elevated     General bed mobility comments: Inefficient scooting, and used rails heavily to get to EOB; very slow moving, with light mod assist to pull from semi-sidelying to sit  Transfers Overall transfer level: Needs assistance Equipment used: Rolling walker (2 wheeled) Transfers: Sit to/from Stand Sit to Stand: Max assist;Mod assist         General transfer comment: Heavy mod assist and Max A for steadying assist. Pt with heavy posterior lean in standing and unable to correct. Pt leaning heavily on bed for support in standing, and when assist to correct posterior lean was lessened, he would lose his balance and sit back to bed or chair; Unable to stand without considerable assist  Ambulation/Gait Ambulation/Gait assistance: Mod assist;Max assist Gait Distance (Feet): (pivotal steps bed to chair) Assistive device: Rolling walker (2 wheeled) Gait Pattern/deviations: Shuffle     General Gait Details: Heavy posterior lean  Stairs            Wheelchair Mobility    Modified Rankin (Stroke Patients Only)       Balance     Sitting balance-Leahy Scale: Fair       Standing balance-Leahy Scale: Poor Standing balance comment: Heavy posterior lean in standing and reliant on max A to maintain standing balance.                              Pertinent Vitals/Pain Pain Assessment: Faces Faces Pain Scale: Hurts a little bit Pain Location: He is nervouse about his abdominal pain flaring up Pain Descriptors / Indicators: Guarding Pain Intervention(s): Monitored during session  Home Living Family/patient expects to be discharged to:: Private residence Living Arrangements: Spouse/significant other Available Help at Discharge: Family;Personal care attendant(at or near 24 hours) Type of Home: House Home Access: [place]: One [place]: Environmental consultant - 2 wheels;Bedside commode;Cane - single point(Lift  chair) Additional Comments: Wife has Parkinson's and cannot provide assistance. Reports his wife has caretakers, but they do not help him with daily tasks.     Prior Function Level of Independence: Independent with assistive device(s)         Comments: Independent with assistive device prior to last admission; in the few days he was home before this admission, he was having difficulty; Noted he has had multiple falls     Hand Dominance        Extremity/Trunk Assessment   Upper Extremity Assessment Upper Extremity Assessment: Generalized weakness    Lower Extremity Assessment Lower Extremity Assessment: Generalized weakness    Cervical / Trunk Assessment Cervical / Trunk Assessment: Kyphotic  Communication   Communication: No difficulties  Cognition Arousal/Alertness: Awake/alert Behavior During Therapy: WFL for tasks assessed/performed Overall Cognitive Status: No family/caregiver present to determine baseline cognitive functioning(for simple mobility tasks)                                 General Comments: Per Johnny Ora, RN, his cognition is much clearer today than yesterday      General Comments      Exercises     Assessment/Plan    PT Assessment Patient needs continued PT services  PT Problem List Decreased strength;Decreased balance;Decreased mobility;Decreased cognition;Decreased safety awareness;Decreased knowledge of use of DME;Decreased knowledge of precautions;Pain       PT Treatment Interventions DME instruction;Gait training;Functional mobility training;Therapeutic exercise;Therapeutic activities;Balance training;Patient/family education    PT Goals (Current goals can be found in the Care Plan section)  Acute Rehab PT Goals Patient Stated Goal: to go home today PT Goal Formulation: With patient Time For Goal Achievement: 04/10/19 Potential to Achieve Goals: Good    Frequency Min 3X/week   Barriers to discharge Other  (comment)(Strongly recommend 24 hour assist)      Co-evaluation               AM-PAC PT "6 Clicks" Mobility  Outcome Measure Help needed turning from your back to your side while in a flat bed without using bedrails?: A Little Help needed moving from lying on your back to sitting on the side of a flat bed without using bedrails?: A Little Help needed moving to and from a bed to a chair (including a wheelchair)?: A Lot Help needed standing up from a chair using your arms (e.g., wheelchair or bedside chair)?: A Lot Help needed to walk in hospital room?: Total Help needed climbing 3-5 steps with a railing? : Total 6 Click Score: 12    End of Session Equipment Utilized During Treatment: Gait belt Activity Tolerance: Patient tolerated treatment well Patient left: in chair;with call bell/phone within reach;with chair alarm set Nurse Communication: Mobility status PT Visit Diagnosis: Unsteadiness on feet (R26.81);Muscle weakness (generalized) (M62.81);Difficulty in walking, not elsewhere classified (R26.2)    Time: 9450-3888 PT Time Calculation (min) (ACUTE ONLY): 36 min   Charges:   PT Evaluation $PT Eval Moderate Complexity: 1 Mod PT Treatments $Therapeutic Activity: 8-22 mins        Roney Marion, PT  Acute Rehabilitation Services Pager [phone] Office [phone]  [place] 03/27/2019, 3:09 PM

## 2019-03-27 NOTE — Progress Notes (Signed)
Rehab Admissions Coordinator Note:  Per PT recommendation, this patient was screened by Jhonnie Garner for appropriateness for an Inpatient Acute Rehab Consult.  At this time, medical workup still in progress. Will follow to see if this patient has a diagnosis amenable to CIR.    Jhonnie Garner 03/27/2019, 3:24 PM  I can be reached at [phone].

## 2019-03-29 DIAGNOSIS — I6522 Occlusion and stenosis of left carotid artery: Secondary | ICD-10-CM | POA: Diagnosis not present

## 2019-03-29 DIAGNOSIS — E039 Hypothyroidism, unspecified: Secondary | ICD-10-CM | POA: Diagnosis not present

## 2019-03-29 DIAGNOSIS — E538 Deficiency of other specified B group vitamins: Secondary | ICD-10-CM | POA: Diagnosis not present

## 2019-03-29 DIAGNOSIS — M75100 Unspecified rotator cuff tear or rupture of unspecified shoulder, not specified as traumatic: Secondary | ICD-10-CM | POA: Diagnosis not present

## 2019-03-29 DIAGNOSIS — K649 Unspecified hemorrhoids: Secondary | ICD-10-CM | POA: Diagnosis not present

## 2019-03-29 DIAGNOSIS — D649 Anemia, unspecified: Secondary | ICD-10-CM | POA: Diagnosis not present

## 2019-03-29 DIAGNOSIS — F329 Major depressive disorder, single episode, unspecified: Secondary | ICD-10-CM | POA: Diagnosis not present

## 2019-03-29 DIAGNOSIS — M19019 Primary osteoarthritis, unspecified shoulder: Secondary | ICD-10-CM | POA: Diagnosis not present

## 2019-03-29 DIAGNOSIS — Z48815 Encounter for surgical aftercare following surgery on the digestive system: Secondary | ICD-10-CM | POA: Diagnosis not present

## 2019-03-29 DIAGNOSIS — I119 Hypertensive heart disease without heart failure: Secondary | ICD-10-CM | POA: Diagnosis not present

## 2019-03-29 DIAGNOSIS — H539 Unspecified visual disturbance: Secondary | ICD-10-CM | POA: Diagnosis not present

## 2019-03-29 DIAGNOSIS — E785 Hyperlipidemia, unspecified: Secondary | ICD-10-CM | POA: Diagnosis not present

## 2019-03-29 DIAGNOSIS — D72829 Elevated white blood cell count, unspecified: Secondary | ICD-10-CM | POA: Diagnosis not present

## 2019-03-29 DIAGNOSIS — M25552 Pain in left hip: Secondary | ICD-10-CM | POA: Diagnosis not present

## 2019-03-29 DIAGNOSIS — M5136 Other intervertebral disc degeneration, lumbar region: Secondary | ICD-10-CM | POA: Diagnosis not present

## 2019-03-29 DIAGNOSIS — I700 Atherosclerosis of aorta: Secondary | ICD-10-CM | POA: Diagnosis not present

## 2019-03-29 DIAGNOSIS — K5900 Constipation, unspecified: Secondary | ICD-10-CM | POA: Diagnosis not present

## 2019-03-29 DIAGNOSIS — N281 Cyst of kidney, acquired: Secondary | ICD-10-CM | POA: Diagnosis not present

## 2019-03-29 DIAGNOSIS — J9811 Atelectasis: Secondary | ICD-10-CM | POA: Diagnosis not present

## 2019-03-29 DIAGNOSIS — K5939 Other megacolon: Secondary | ICD-10-CM | POA: Diagnosis not present

## 2019-03-29 DIAGNOSIS — N400 Enlarged prostate without lower urinary tract symptoms: Secondary | ICD-10-CM | POA: Diagnosis not present

## 2019-03-29 DIAGNOSIS — I25110 Atherosclerotic heart disease of native coronary artery with unstable angina pectoris: Secondary | ICD-10-CM | POA: Diagnosis not present

## 2019-03-29 DIAGNOSIS — I471 Supraventricular tachycardia: Secondary | ICD-10-CM | POA: Diagnosis not present

## 2019-03-29 DIAGNOSIS — K219 Gastro-esophageal reflux disease without esophagitis: Secondary | ICD-10-CM | POA: Diagnosis not present

## 2019-03-29 DIAGNOSIS — K668 Other specified disorders of peritoneum: Secondary | ICD-10-CM | POA: Diagnosis not present

## 2019-03-30 ENCOUNTER — Telehealth: Payer: Self-pay | Admitting: Cardiology

## 2019-03-30 LAB — CULTURE, BLOOD (ROUTINE X 2)
Culture: NO GROWTH
Culture: NO GROWTH
Special Requests: ADEQUATE

## 2019-03-30 NOTE — Telephone Encounter (Signed)
Spoke to patient's daughter Amy she stated she wanted Dr.Jordan to look at father's echo he had done in hospital recently.Stated father still complains of sob,tired.Feet and legs still swollen,but no worse.She stated father looks better, but she does not want to miss anything.She also wants to know if father needs a office visit with Dr.Jordan.Message sent to [place] for advice.

## 2019-03-30 NOTE — Telephone Encounter (Signed)
Spoke to patient's daughter Amy Dr.Jordan's advice given.6 month follow up virtual visit scheduled with Dr.Jordan 05/10/19 at 10:00 am.Patient gave permission for a virtual visit.

## 2019-03-30 NOTE — Telephone Encounter (Signed)
I reviewed Echo and recent hospital notes. He was admitted 2 days after cholecystectomy. It looks like he had atelectasis. CXR and CT showed no CHF. No evidenced of PE and Echo looked quite good with normal EF and valves. Had a fairly recent cardiac cath earlier this year. I think he is OK from a heart standpoint. Work on Psychologist, occupational. I don't think he needs to be seen in office.   Damiah Mcdonald Martinique MD, [facility]

## 2019-03-30 NOTE — Telephone Encounter (Signed)
  Daughter is calling because Mr Zulueta had to go to the hospital on 03/25/19 and he had an echo done at [facility]. The dr there said they should get Dr Martinique to look at the echo and follow up. Ms Tarkowski would like Dr Martinique to look at the echo and let her know if Mr Fiske needs to be seen in the office or not or if Dr Martinique has any recommendations from the echo. Her dad was down to 80% O2 when he was taken to the hospital on Friday.

## 2019-03-31 DIAGNOSIS — I471 Supraventricular tachycardia: Secondary | ICD-10-CM | POA: Diagnosis not present

## 2019-03-31 DIAGNOSIS — I25110 Atherosclerotic heart disease of native coronary artery with unstable angina pectoris: Secondary | ICD-10-CM | POA: Diagnosis not present

## 2019-03-31 DIAGNOSIS — D649 Anemia, unspecified: Secondary | ICD-10-CM | POA: Diagnosis not present

## 2019-03-31 DIAGNOSIS — Z48815 Encounter for surgical aftercare following surgery on the digestive system: Secondary | ICD-10-CM | POA: Diagnosis not present

## 2019-03-31 DIAGNOSIS — I119 Hypertensive heart disease without heart failure: Secondary | ICD-10-CM | POA: Diagnosis not present

## 2019-03-31 DIAGNOSIS — F329 Major depressive disorder, single episode, unspecified: Secondary | ICD-10-CM | POA: Diagnosis not present

## 2019-04-01 DIAGNOSIS — D649 Anemia, unspecified: Secondary | ICD-10-CM | POA: Diagnosis not present

## 2019-04-01 DIAGNOSIS — F329 Major depressive disorder, single episode, unspecified: Secondary | ICD-10-CM | POA: Diagnosis not present

## 2019-04-01 DIAGNOSIS — Z48815 Encounter for surgical aftercare following surgery on the digestive system: Secondary | ICD-10-CM | POA: Diagnosis not present

## 2019-04-01 DIAGNOSIS — I471 Supraventricular tachycardia: Secondary | ICD-10-CM | POA: Diagnosis not present

## 2019-04-01 DIAGNOSIS — I119 Hypertensive heart disease without heart failure: Secondary | ICD-10-CM | POA: Diagnosis not present

## 2019-04-01 DIAGNOSIS — I25110 Atherosclerotic heart disease of native coronary artery with unstable angina pectoris: Secondary | ICD-10-CM | POA: Diagnosis not present

## 2019-04-04 DIAGNOSIS — D649 Anemia, unspecified: Secondary | ICD-10-CM | POA: Diagnosis not present

## 2019-04-04 DIAGNOSIS — I119 Hypertensive heart disease without heart failure: Secondary | ICD-10-CM | POA: Diagnosis not present

## 2019-04-04 DIAGNOSIS — I471 Supraventricular tachycardia: Secondary | ICD-10-CM | POA: Diagnosis not present

## 2019-04-04 DIAGNOSIS — Z48815 Encounter for surgical aftercare following surgery on the digestive system: Secondary | ICD-10-CM | POA: Diagnosis not present

## 2019-04-04 DIAGNOSIS — I25110 Atherosclerotic heart disease of native coronary artery with unstable angina pectoris: Secondary | ICD-10-CM | POA: Diagnosis not present

## 2019-04-04 DIAGNOSIS — F329 Major depressive disorder, single episode, unspecified: Secondary | ICD-10-CM | POA: Diagnosis not present

## 2019-04-06 DIAGNOSIS — I471 Supraventricular tachycardia: Secondary | ICD-10-CM | POA: Diagnosis not present

## 2019-04-06 DIAGNOSIS — F329 Major depressive disorder, single episode, unspecified: Secondary | ICD-10-CM | POA: Diagnosis not present

## 2019-04-06 DIAGNOSIS — I119 Hypertensive heart disease without heart failure: Secondary | ICD-10-CM | POA: Diagnosis not present

## 2019-04-06 DIAGNOSIS — D649 Anemia, unspecified: Secondary | ICD-10-CM | POA: Diagnosis not present

## 2019-04-06 DIAGNOSIS — Z48815 Encounter for surgical aftercare following surgery on the digestive system: Secondary | ICD-10-CM | POA: Diagnosis not present

## 2019-04-06 DIAGNOSIS — I25110 Atherosclerotic heart disease of native coronary artery with unstable angina pectoris: Secondary | ICD-10-CM | POA: Diagnosis not present

## 2019-04-07 DIAGNOSIS — I119 Hypertensive heart disease without heart failure: Secondary | ICD-10-CM | POA: Diagnosis not present

## 2019-04-07 DIAGNOSIS — I471 Supraventricular tachycardia: Secondary | ICD-10-CM | POA: Diagnosis not present

## 2019-04-07 DIAGNOSIS — I25110 Atherosclerotic heart disease of native coronary artery with unstable angina pectoris: Secondary | ICD-10-CM | POA: Diagnosis not present

## 2019-04-07 DIAGNOSIS — F329 Major depressive disorder, single episode, unspecified: Secondary | ICD-10-CM | POA: Diagnosis not present

## 2019-04-07 DIAGNOSIS — D649 Anemia, unspecified: Secondary | ICD-10-CM | POA: Diagnosis not present

## 2019-04-07 DIAGNOSIS — Z48815 Encounter for surgical aftercare following surgery on the digestive system: Secondary | ICD-10-CM | POA: Diagnosis not present

## 2019-04-11 DIAGNOSIS — F329 Major depressive disorder, single episode, unspecified: Secondary | ICD-10-CM | POA: Diagnosis not present

## 2019-04-11 DIAGNOSIS — I25110 Atherosclerotic heart disease of native coronary artery with unstable angina pectoris: Secondary | ICD-10-CM | POA: Diagnosis not present

## 2019-04-11 DIAGNOSIS — D649 Anemia, unspecified: Secondary | ICD-10-CM | POA: Diagnosis not present

## 2019-04-11 DIAGNOSIS — Z48815 Encounter for surgical aftercare following surgery on the digestive system: Secondary | ICD-10-CM | POA: Diagnosis not present

## 2019-04-11 DIAGNOSIS — I471 Supraventricular tachycardia: Secondary | ICD-10-CM | POA: Diagnosis not present

## 2019-04-11 DIAGNOSIS — I119 Hypertensive heart disease without heart failure: Secondary | ICD-10-CM | POA: Diagnosis not present

## 2019-04-12 DIAGNOSIS — I129 Hypertensive chronic kidney disease with stage 1 through stage 4 chronic kidney disease, or unspecified chronic kidney disease: Secondary | ICD-10-CM | POA: Diagnosis not present

## 2019-04-12 DIAGNOSIS — N183 Chronic kidney disease, stage 3 (moderate): Secondary | ICD-10-CM | POA: Diagnosis not present

## 2019-04-12 DIAGNOSIS — F329 Major depressive disorder, single episode, unspecified: Secondary | ICD-10-CM | POA: Diagnosis not present

## 2019-04-12 DIAGNOSIS — D510 Vitamin B12 deficiency anemia due to intrinsic factor deficiency: Secondary | ICD-10-CM | POA: Diagnosis not present

## 2019-04-12 DIAGNOSIS — E039 Hypothyroidism, unspecified: Secondary | ICD-10-CM | POA: Diagnosis not present

## 2019-04-12 DIAGNOSIS — R410 Disorientation, unspecified: Secondary | ICD-10-CM | POA: Diagnosis not present

## 2019-04-12 DIAGNOSIS — I119 Hypertensive heart disease without heart failure: Secondary | ICD-10-CM | POA: Diagnosis not present

## 2019-04-12 DIAGNOSIS — R634 Abnormal weight loss: Secondary | ICD-10-CM | POA: Diagnosis not present

## 2019-04-12 DIAGNOSIS — D649 Anemia, unspecified: Secondary | ICD-10-CM | POA: Diagnosis not present

## 2019-04-12 DIAGNOSIS — I25110 Atherosclerotic heart disease of native coronary artery with unstable angina pectoris: Secondary | ICD-10-CM | POA: Diagnosis not present

## 2019-04-12 DIAGNOSIS — Z48815 Encounter for surgical aftercare following surgery on the digestive system: Secondary | ICD-10-CM | POA: Diagnosis not present

## 2019-04-12 DIAGNOSIS — I471 Supraventricular tachycardia: Secondary | ICD-10-CM | POA: Diagnosis not present

## 2019-04-12 DIAGNOSIS — Z9049 Acquired absence of other specified parts of digestive tract: Secondary | ICD-10-CM | POA: Diagnosis not present

## 2019-04-14 DIAGNOSIS — D510 Vitamin B12 deficiency anemia due to intrinsic factor deficiency: Secondary | ICD-10-CM | POA: Diagnosis not present

## 2019-04-14 DIAGNOSIS — I10 Essential (primary) hypertension: Secondary | ICD-10-CM | POA: Diagnosis not present

## 2019-04-15 DIAGNOSIS — I119 Hypertensive heart disease without heart failure: Secondary | ICD-10-CM | POA: Diagnosis not present

## 2019-04-15 DIAGNOSIS — D649 Anemia, unspecified: Secondary | ICD-10-CM | POA: Diagnosis not present

## 2019-04-15 DIAGNOSIS — I25110 Atherosclerotic heart disease of native coronary artery with unstable angina pectoris: Secondary | ICD-10-CM | POA: Diagnosis not present

## 2019-04-15 DIAGNOSIS — I471 Supraventricular tachycardia: Secondary | ICD-10-CM | POA: Diagnosis not present

## 2019-04-15 DIAGNOSIS — F329 Major depressive disorder, single episode, unspecified: Secondary | ICD-10-CM | POA: Diagnosis not present

## 2019-04-15 DIAGNOSIS — Z48815 Encounter for surgical aftercare following surgery on the digestive system: Secondary | ICD-10-CM | POA: Diagnosis not present

## 2019-04-18 DIAGNOSIS — I25110 Atherosclerotic heart disease of native coronary artery with unstable angina pectoris: Secondary | ICD-10-CM | POA: Diagnosis not present

## 2019-04-18 DIAGNOSIS — F329 Major depressive disorder, single episode, unspecified: Secondary | ICD-10-CM | POA: Diagnosis not present

## 2019-04-18 DIAGNOSIS — Z48815 Encounter for surgical aftercare following surgery on the digestive system: Secondary | ICD-10-CM | POA: Diagnosis not present

## 2019-04-18 DIAGNOSIS — D649 Anemia, unspecified: Secondary | ICD-10-CM | POA: Diagnosis not present

## 2019-04-18 DIAGNOSIS — I471 Supraventricular tachycardia: Secondary | ICD-10-CM | POA: Diagnosis not present

## 2019-04-18 DIAGNOSIS — I119 Hypertensive heart disease without heart failure: Secondary | ICD-10-CM | POA: Diagnosis not present

## 2019-04-25 DIAGNOSIS — Z48815 Encounter for surgical aftercare following surgery on the digestive system: Secondary | ICD-10-CM | POA: Diagnosis not present

## 2019-04-25 DIAGNOSIS — I119 Hypertensive heart disease without heart failure: Secondary | ICD-10-CM | POA: Diagnosis not present

## 2019-04-25 DIAGNOSIS — I471 Supraventricular tachycardia: Secondary | ICD-10-CM | POA: Diagnosis not present

## 2019-04-25 DIAGNOSIS — D649 Anemia, unspecified: Secondary | ICD-10-CM | POA: Diagnosis not present

## 2019-04-25 DIAGNOSIS — I25110 Atherosclerotic heart disease of native coronary artery with unstable angina pectoris: Secondary | ICD-10-CM | POA: Diagnosis not present

## 2019-04-25 DIAGNOSIS — F329 Major depressive disorder, single episode, unspecified: Secondary | ICD-10-CM | POA: Diagnosis not present

## 2019-04-28 DIAGNOSIS — K668 Other specified disorders of peritoneum: Secondary | ICD-10-CM | POA: Diagnosis not present

## 2019-04-28 DIAGNOSIS — M5136 Other intervertebral disc degeneration, lumbar region: Secondary | ICD-10-CM | POA: Diagnosis not present

## 2019-04-28 DIAGNOSIS — I25110 Atherosclerotic heart disease of native coronary artery with unstable angina pectoris: Secondary | ICD-10-CM | POA: Diagnosis not present

## 2019-04-28 DIAGNOSIS — H539 Unspecified visual disturbance: Secondary | ICD-10-CM | POA: Diagnosis not present

## 2019-04-28 DIAGNOSIS — K649 Unspecified hemorrhoids: Secondary | ICD-10-CM | POA: Diagnosis not present

## 2019-04-28 DIAGNOSIS — I119 Hypertensive heart disease without heart failure: Secondary | ICD-10-CM | POA: Diagnosis not present

## 2019-04-28 DIAGNOSIS — M75100 Unspecified rotator cuff tear or rupture of unspecified shoulder, not specified as traumatic: Secondary | ICD-10-CM | POA: Diagnosis not present

## 2019-04-28 DIAGNOSIS — K219 Gastro-esophageal reflux disease without esophagitis: Secondary | ICD-10-CM | POA: Diagnosis not present

## 2019-04-28 DIAGNOSIS — I6522 Occlusion and stenosis of left carotid artery: Secondary | ICD-10-CM | POA: Diagnosis not present

## 2019-04-28 DIAGNOSIS — E039 Hypothyroidism, unspecified: Secondary | ICD-10-CM | POA: Diagnosis not present

## 2019-04-28 DIAGNOSIS — F329 Major depressive disorder, single episode, unspecified: Secondary | ICD-10-CM | POA: Diagnosis not present

## 2019-04-28 DIAGNOSIS — E538 Deficiency of other specified B group vitamins: Secondary | ICD-10-CM | POA: Diagnosis not present

## 2019-04-28 DIAGNOSIS — J9811 Atelectasis: Secondary | ICD-10-CM | POA: Diagnosis not present

## 2019-04-28 DIAGNOSIS — Z48815 Encounter for surgical aftercare following surgery on the digestive system: Secondary | ICD-10-CM | POA: Diagnosis not present

## 2019-04-28 DIAGNOSIS — K5939 Other megacolon: Secondary | ICD-10-CM | POA: Diagnosis not present

## 2019-04-28 DIAGNOSIS — D72829 Elevated white blood cell count, unspecified: Secondary | ICD-10-CM | POA: Diagnosis not present

## 2019-04-28 DIAGNOSIS — N281 Cyst of kidney, acquired: Secondary | ICD-10-CM | POA: Diagnosis not present

## 2019-04-28 DIAGNOSIS — I700 Atherosclerosis of aorta: Secondary | ICD-10-CM | POA: Diagnosis not present

## 2019-04-28 DIAGNOSIS — I471 Supraventricular tachycardia: Secondary | ICD-10-CM | POA: Diagnosis not present

## 2019-04-28 DIAGNOSIS — E785 Hyperlipidemia, unspecified: Secondary | ICD-10-CM | POA: Diagnosis not present

## 2019-04-28 DIAGNOSIS — M19019 Primary osteoarthritis, unspecified shoulder: Secondary | ICD-10-CM | POA: Diagnosis not present

## 2019-04-28 DIAGNOSIS — M25552 Pain in left hip: Secondary | ICD-10-CM | POA: Diagnosis not present

## 2019-04-28 DIAGNOSIS — N400 Enlarged prostate without lower urinary tract symptoms: Secondary | ICD-10-CM | POA: Diagnosis not present

## 2019-04-28 DIAGNOSIS — K5900 Constipation, unspecified: Secondary | ICD-10-CM | POA: Diagnosis not present

## 2019-04-28 DIAGNOSIS — D649 Anemia, unspecified: Secondary | ICD-10-CM | POA: Diagnosis not present

## 2019-04-29 DIAGNOSIS — I25110 Atherosclerotic heart disease of native coronary artery with unstable angina pectoris: Secondary | ICD-10-CM | POA: Diagnosis not present

## 2019-04-29 DIAGNOSIS — I119 Hypertensive heart disease without heart failure: Secondary | ICD-10-CM | POA: Diagnosis not present

## 2019-04-29 DIAGNOSIS — I471 Supraventricular tachycardia: Secondary | ICD-10-CM | POA: Diagnosis not present

## 2019-04-29 DIAGNOSIS — D649 Anemia, unspecified: Secondary | ICD-10-CM | POA: Diagnosis not present

## 2019-04-29 DIAGNOSIS — Z48815 Encounter for surgical aftercare following surgery on the digestive system: Secondary | ICD-10-CM | POA: Diagnosis not present

## 2019-04-29 DIAGNOSIS — F329 Major depressive disorder, single episode, unspecified: Secondary | ICD-10-CM | POA: Diagnosis not present

## 2019-05-04 DIAGNOSIS — I25110 Atherosclerotic heart disease of native coronary artery with unstable angina pectoris: Secondary | ICD-10-CM | POA: Diagnosis not present

## 2019-05-04 DIAGNOSIS — I119 Hypertensive heart disease without heart failure: Secondary | ICD-10-CM | POA: Diagnosis not present

## 2019-05-04 DIAGNOSIS — Z48815 Encounter for surgical aftercare following surgery on the digestive system: Secondary | ICD-10-CM | POA: Diagnosis not present

## 2019-05-04 DIAGNOSIS — D649 Anemia, unspecified: Secondary | ICD-10-CM | POA: Diagnosis not present

## 2019-05-04 DIAGNOSIS — I471 Supraventricular tachycardia: Secondary | ICD-10-CM | POA: Diagnosis not present

## 2019-05-04 DIAGNOSIS — F329 Major depressive disorder, single episode, unspecified: Secondary | ICD-10-CM | POA: Diagnosis not present

## 2019-05-06 DIAGNOSIS — F329 Major depressive disorder, single episode, unspecified: Secondary | ICD-10-CM | POA: Diagnosis not present

## 2019-05-06 DIAGNOSIS — I119 Hypertensive heart disease without heart failure: Secondary | ICD-10-CM | POA: Diagnosis not present

## 2019-05-06 DIAGNOSIS — I25110 Atherosclerotic heart disease of native coronary artery with unstable angina pectoris: Secondary | ICD-10-CM | POA: Diagnosis not present

## 2019-05-06 DIAGNOSIS — I471 Supraventricular tachycardia: Secondary | ICD-10-CM | POA: Diagnosis not present

## 2019-05-06 DIAGNOSIS — Z48815 Encounter for surgical aftercare following surgery on the digestive system: Secondary | ICD-10-CM | POA: Diagnosis not present

## 2019-05-06 DIAGNOSIS — D649 Anemia, unspecified: Secondary | ICD-10-CM | POA: Diagnosis not present

## 2019-05-07 NOTE — Progress Notes (Signed)
Virtual Visit via Telephone Note   This visit type was conducted due to national recommendations for restrictions regarding the COVID-19 Pandemic (e.g. social distancing) in an effort to limit this patient's exposure and mitigate transmission in our community.  Due to his co-morbid illnesses, this patient is at least at moderate risk for complications without adequate follow up.  This format is felt to be most appropriate for this patient at this time.  The patient did not have access to video technology/had technical difficulties with video requiring transitioning to audio format only (telephone).  All issues noted in this document were discussed and addressed.  No physical exam could be performed with this format.  Please refer to the patient's chart for his  consent to telehealth for [facility].   Date:  05/10/2019   ID:  Johnny [name], DOB [birth year], MRN [id number]  Patient Location: Home Provider Location: Home  PCP:  Johnny Bunting, MD  Cardiologist:  Johnny Paskett Martinique, MD  Electrophysiologist:  None   Evaluation Performed:  Follow-Up Visit  Chief Complaint:  Follow up SVT, CAD  History of Present Illness:    Johnny [name] is a 78 y.o. male with a history of supraventricular tachycardia. He is s/p stenting of the proximal LAD in 2005 with a Cypher stent. He is on chronic DAPT. He had repeat caths in 2010 and Dec. 2012 with nonobstructive disease.   He was admitted 1/3-10/30/18 with complaints of dyspnea, chest pressure and a "fluttering" sensation. Troponin levels were negative x 3. V/Q scan was low probability for PE. Creatinine was elevated to 1.79. ACEi and diuretic were held. Metoprolol increased for fluttering. There was some consideration for cardiac cath but this was deferred due to elevated creatinine. On follow up he had  persistent anterior chest pressure and tightness like he is wearing a seatbelt that is too tight. BP has been running higher. He has no energy and complains of  SOB.  Due to these persistent symptoms and improvement in renal function he did undergo a cardiac cath on 11/05/18 showing nonobstructive CAD.  In [place] he underwent laproscopic cholecystectomy. Initially did well but returned the day after DC with confusion and metabolic encephalopathy. Oxygen level was low. CT negative for PE. Echo stable. BNP mildly elevated as were troponin levels. CXR some atx. CT head was OK. DC on 5/30.   On follow up today he is still weak from his surgery. He does have some SOB. No chest pain or edema. His appetite is poor and he has lost considerable weight since January- 28 lbs. He is getting PT. Still has a lot of hip pain.  The patient does not have symptoms concerning for COVID-19 infection (fever, chills, cough, or new shortness of breath).    Past Medical History:  Diagnosis Date   Acromioclavicular joint arthritis    Anemia    B12 deficiency 10/15/11   "take injections q 28 days"   Coronary artery disease    LHC 10/15/11: LAD stent patent with mid ectasia, distal LAD 50-60%, mid circumflex with ectasia, EF 55-65%.   Dyslipidemia    GERD (gastroesophageal reflux disease)    Glucose intolerance (pre-diabetes)    Borderline glucose intolerance   Hypertension    Hypothyroidism    Post concussive syndrome 2011   "for ~ 6-8 months"   Rotator cuff tear    Shortness of breath 10/15/11   "here lately I've been having it on & off any time"   SVT (supraventricular tachycardia) ([place])  maintained on beta blocker   Past Surgical History:  Procedure Laterality Date   BACK SURGERY     CARDIAC CATHETERIZATION  12/19/08   NORMAL. EF 55%   CARDIAC CATHETERIZATION  10/15/11   CARDIOVASCULAR STRESS TEST  10/2010   CARPAL TUNNEL RELEASE     bilaterally   CHOLECYSTECTOMY  03/23/2019   COLONOSCOPY W/ ENDOSCOPIC [place]     CORONARY ANGIOPLASTY WITH STENT PLACEMENT  9/ 2005    JOINT REPLACEMENT     LAPAROSCOPIC CHOLECYSTECTOMY SINGLE SITE WITH  INTRAOPERATIVE CHOLANGIOGRAM N/A 03/23/2019   Procedure: SINGLE SITE LAPAROSCOPIC CHOLECYSTECTOMY WITH INTRAOPERATIVE CHOLANGIOGRAM;  Surgeon: Johnny Boston, MD;  Location: [place];  Service: General;  Laterality: N/A;   LEFT HEART CATH AND CORONARY ANGIOGRAPHY N/A 11/05/2018   Procedure: LEFT HEART CATH AND CORONARY ANGIOGRAPHY;  Surgeon: [name], Madina Galati M, MD;  Location: [place] CV LAB;  Service: Cardiovascular;  Laterality: N/A;   LEFT HEART CATHETERIZATION WITH CORONARY ANGIOGRAM N/A 10/15/2011   Procedure: LEFT HEART CATHETERIZATION WITH CORONARY ANGIOGRAM;  Surgeon: Johnny Mocha, MD;  Location: [facility] CATH LAB;  Service: Cardiovascular;  Laterality: N/A;   LUMBAR LAMINECTOMY  1970's   NASAL SINUS SURGERY     x2   reconstructive shoulder surgery     right   REPLACEMENT TOTAL KNEE  12/2007   left   RESECTION TUMOR CLAVICLE RADICAL     Open distal clavicle resection   SHOULDER SURGERY  02/11/2011   right   TRIGGER FINGER RELEASE     ? both hands     Current Meds  Medication Sig   acetaminophen (TYLENOL) 650 MG CR tablet Take 1,300 mg by mouth 2 (two) times daily.    ANORO ELLIPTA 62.5-25 MCG/INH AEPB Take 1 puff by mouth daily as needed (shortness of breath).    aspirin 81 MG tablet Take 81 mg by mouth daily.     atorvastatin (LIPITOR) 10 MG tablet Take 1 tablet (10 mg total) by mouth daily.   Cholecalciferol (VITAMIN D3) 50 MCG (2000 UT) capsule Take 2,000 Units by mouth every evening.   clopidogrel (PLAVIX) 75 MG tablet TAKE 1 TABLET BY MOUTH EVERY DAY (Patient taking differently: Take 75 mg by mouth daily. )   Cyanocobalamin (VITAMIN B-12 IJ) Inject 1 Dose as directed every 30 (thirty) days.    diclofenac sodium (VOLTAREN) 1 % GEL Apply 2 g topically 4 (four) times daily. (Patient taking differently: Apply 2 g topically 4 (four) times daily as needed (hip pain). )   DULoxetine (CYMBALTA) 30 MG capsule Take 30 mg by mouth every evening.   furosemide (LASIX) 20 MG  tablet Take 1 tablet (20 mg total) by mouth daily as needed for fluid or edema (or SOB or weight gain).   Glucos-Chond-Hyal Ac-Ca Fructo (MOVE FREE JOINT HEALTH ADVANCE PO) Take 1 capsule by mouth 2 (two) times daily.   hydrOXYzine (ATARAX) 25 MG tablet Take 25 mg by mouth at bedtime.    levothyroxine (SYNTHROID, LEVOTHROID) 25 MCG tablet Take 25 mcg by mouth daily.     lisinopril (PRINIVIL,ZESTRIL) 20 MG tablet Take 1 tablet (20 mg total) by mouth daily.   loperamide (IMODIUM A-D) 2 MG tablet Take 2-4 mg by mouth as needed for diarrhea or loose stools.   Magnesium 400 MG CAPS Take 400 mg by mouth every evening.   meclizine (ANTIVERT) 25 MG tablet Take 25 mg by mouth 2 (two) times daily as needed for dizziness or nausea.    metoprolol tartrate (LOPRESSOR) 50 MG  tablet Take 1.5 tablets (75 mg total) by mouth 2 (two) times daily.   nitroGLYCERIN (NITROSTAT) 0.4 MG SL tablet Place 1 tablet (0.4 mg total) under the tongue every 5 (five) minutes as needed for chest pain.   ondansetron (ZOFRAN) 4 MG tablet Take 1 tablet (4 mg total) by mouth every 8 (eight) hours as needed for nausea or vomiting.   pantoprazole (PROTONIX) 40 MG tablet Take 1 tablet (40 mg total) by mouth daily. NEED OV.   senna (SENOKOT) 8.6 MG TABS tablet Take 1 tablet by mouth daily as needed for mild constipation.    traMADol (ULTRAM) 50 MG tablet Take 1-2 tablets (50-100 mg total) by mouth every 6 (six) hours as needed for moderate pain or severe pain.     Allergies:   Patient has no known allergies.   Social History   Tobacco Use   Smoking status: Former Smoker    Packs/day: 1.00    Years: 21.00    Pack years: 21.00    Types: Cigarettes    Quit date: 04/07/1976    Years since quitting: 43.1   Smokeless tobacco: Never Used  Substance Use Topics   Alcohol use: Not Currently    Comment: 10/15/11 "haven't drank in 15-20 years"   Drug use: No     Family Hx: The patient's family history includes Diabetes  in his father; Heart attack in his father; Kidney failure in his father; Stroke in his mother.  ROS:   Please see the history of present illness.    All other systems reviewed and are negative.   Prior CV studies:    Cardiac cath 11/05/18:  LEFT HEART CATH AND CORONARY ANGIOGRAPHY  Conclusion    Previously placed Prox LAD stent (unknown type) is widely patent.  Mid LAD lesion is 45% stenosed.  LV end diastolic pressure is normal.   1. Nonobstructive CAD 2. Normal LVEDP  Plan: continue medical therapy. Will obtain an Echo as outpatient to assess LV function. Consider alternative causes of chest pain.     Echo 03/26/19:  The following studies were reviewed today:  IMPRESSIONS    1. The left ventricle has low normal systolic function, with an ejection fraction of 50-55%. The cavity size was normal. Left ventricular diastolic Doppler parameters are consistent with impaired relaxation.  2. LVEF is grossly normal with mild hypokinesis of the anterior wall.  3. The right ventricle has normal systolic function. The cavity was normal. There is no increase in right ventricular wall thickness.  4. The mitral valve is grossly normal. Mild thickening of the mitral valve leaflet. There is mild mitral annular calcification present.  5. The aortic valve is tricuspid. Mild thickening of the aortic valve. Moderate calcification of the aortic valve.  6. AV is thickened, calcified with minimally restricted motion.   Labs/Other Tests and Data Reviewed:    EKG:  No ECG reviewed.  Recent Labs: 03/25/2019: ALT 36; B Natriuretic Peptide 196.3 03/26/2019: Hemoglobin 13.8; Magnesium 1.9; Platelets 181; TSH 3.504 03/27/2019: BUN 14; Creatinine, Ser 0.95; Potassium 3.5; Sodium 137   Recent Lipid Panel Lab Results  Component Value Date/Time   CHOL 133 10/30/2018 03:46 AM   TRIG 142 10/30/2018 03:46 AM   HDL 39 (L) 10/30/2018 03:46 AM   CHOLHDL 3.4 10/30/2018 03:46 AM   LDLCALC 66  10/30/2018 03:46 AM    Wt Readings from Last 3 Encounters:  05/10/19 180 lb (81.6 kg)  03/27/19 184 lb 4.9 oz (83.6 kg)  03/23/19 191 lb (  86.6 kg)     Objective:    Vital Signs:  BP (!) 158/96    Pulse 89    Ht 6' (1.829 [name])    Wt 180 lb (81.6 kg)    BMI 24.41 kg/[name]    VITAL SIGNS:  reviewed  ASSESSMENT & PLAN:    1. Coronary disease. S/p remote stenting of the proximal LAD with Cypher stent in 2005.  Cardiac catheterization 11/05/18  showed nonobstructive disease. He has no angina.    2. Dyslipidemia LDL at goal. 66 on statin.   3. Hypertension. elevated today but BP readings have been in normal range. Will continue metoprolol and lisinopril.   4. SVT controlled on metoprolol.  5. Left hip pain. chronic  6. S/p cholecystectomy  7. Weight loss. Have encouraged increased oral intake with supplement such as Ensure or Boost.    COVID-19 Education: The signs and symptoms of COVID-19 were discussed with the patient and how to seek care for testing (follow up with PCP or arrange E-visit).  The importance of social distancing was discussed today.  Time:   Today, I have spent 15 minutes with the patient with telehealth technology discussing the above problems.     Medication Adjustments/Labs and Tests Ordered: Current medicines are reviewed at length with the patient today.  Concerns regarding medicines are outlined above.   Tests Ordered: No orders of the defined types were placed in this encounter.   Medication Changes: No orders of the defined types were placed in this encounter.   Follow Up:  In Person in 6 month(s)  Signed, Keyri Salberg Martinique, MD  05/10/2019 10:49 AM    [place]

## 2019-05-09 ENCOUNTER — Telehealth: Payer: Self-pay | Admitting: Cardiology

## 2019-05-09 NOTE — Telephone Encounter (Signed)
I called pt's daughter to confirm his appt on 05-10-19 with Dr Martinique.      1. Confirm consent - "In the setting of the current Covid19 crisis, you are scheduled for a (phone or video) visit with your provider on (date) at (time).  Just as we do with many in-office visits, in order for you to participate in this visit, we must obtain consent.  If you'd like, I can send this to your mychart (if signed up) or email for you to review.  Otherwise, I can obtain your verbal consent now.  All virtual visits are billed to your insurance company just like a normal visit would be.  By agreeing to a virtual visit, we'd like you to understand that the technology does not allow for your provider to perform an examination, and thus [place] limit your provider's ability to fully assess your condition. If your provider identifies any concerns that need to be evaluated in person, we will make arrangements to do so.  Finally, though the technology is pretty good, we cannot assure that it will always work on either your or our end, and in the setting of a video visit, we Laseter have to convert it to a phone-only visit.  In either situation, we cannot ensure that we have a secure connection.  Are you willing to proceed?" STAFF: Did the patient verbally acknowledge consent to telehealth visit? Document YES/NO here: Yes       FULL LENGTH CONSENT FOR TELE-HEALTH VISIT   I hereby voluntarily request, consent and authorize [facility] and its employed or contracted physicians, physician assistants, nurse practitioners or other licensed health care professionals (the Practitioner), to provide me with telemedicine health care services (the Services") as deemed necessary by the treating Practitioner. I acknowledge and consent to receive the Services by the Practitioner via telemedicine. I understand that the telemedicine visit will involve communicating with the Practitioner through live audiovisual communication technology and the  disclosure of certain medical information by electronic transmission. I acknowledge that I have been given the opportunity to request an in-person assessment or other available alternative prior to the telemedicine visit and am voluntarily participating in the telemedicine visit.  I understand that I have the right to withhold or withdraw my consent to the use of telemedicine in the course of my care at any time, without affecting my right to future care or treatment, and that the Practitioner or I [place] terminate the telemedicine visit at any time. I understand that I have the right to inspect all information obtained and/or recorded in the course of the telemedicine visit and Fraleigh receive copies of available information for a reasonable fee.  I understand that some of the potential risks of receiving the Services via telemedicine include:   Delay or interruption in medical evaluation due to technological equipment failure or disruption;  Information transmitted Bok not be sufficient (e.g. poor resolution of images) to allow for appropriate medical decision making by the Practitioner; and/or   In rare instances, security protocols could fail, causing a breach of personal health information.  Furthermore, I acknowledge that it is my responsibility to provide information about my medical history, conditions and care that is complete and accurate to the best of my ability. I acknowledge that Practitioner's advice, recommendations, and/or decision Dickerman be based on factors not within their control, such as incomplete or inaccurate data provided by me or distortions of diagnostic images or specimens that W[place]pack result from electronic transmissions. I understand that the  practice of medicine is not an Chief Strategy Officer and that Practitioner makes no warranties or guarantees regarding treatment outcomes. I acknowledge that I will receive a copy of this consent concurrently upon execution via email to the email address I  last provided but Depierro also request a printed copy by calling the office of [place].    I understand that my insurance will be billed for this visit.   I have read or had this consent read to me.  I understand the contents of this consent, which adequately explains the benefits and risks of the Services being provided via telemedicine.   I have been provided ample opportunity to ask questions regarding this consent and the Services and have had my questions answered to my satisfaction.  I give my informed consent for the services to be provided through the use of telemedicine in my medical care  By participating in this telemedicine visit I agree to the above.

## 2019-05-10 ENCOUNTER — Encounter: Payer: Self-pay | Admitting: Cardiology

## 2019-05-10 ENCOUNTER — Telehealth (INDEPENDENT_AMBULATORY_CARE_PROVIDER_SITE_OTHER): Payer: Medicare Other | Admitting: Cardiology

## 2019-05-10 VITALS — BP 158/96 | HR 89 | Ht 72.0 in | Wt 180.0 lb

## 2019-05-10 DIAGNOSIS — I25110 Atherosclerotic heart disease of native coronary artery with unstable angina pectoris: Secondary | ICD-10-CM | POA: Diagnosis not present

## 2019-05-10 DIAGNOSIS — I471 Supraventricular tachycardia: Secondary | ICD-10-CM

## 2019-05-10 DIAGNOSIS — I10 Essential (primary) hypertension: Secondary | ICD-10-CM

## 2019-05-10 NOTE — Patient Instructions (Signed)

## 2019-05-11 DIAGNOSIS — F329 Major depressive disorder, single episode, unspecified: Secondary | ICD-10-CM | POA: Diagnosis not present

## 2019-05-11 DIAGNOSIS — Z48815 Encounter for surgical aftercare following surgery on the digestive system: Secondary | ICD-10-CM | POA: Diagnosis not present

## 2019-05-11 DIAGNOSIS — D510 Vitamin B12 deficiency anemia due to intrinsic factor deficiency: Secondary | ICD-10-CM | POA: Diagnosis not present

## 2019-05-11 DIAGNOSIS — D649 Anemia, unspecified: Secondary | ICD-10-CM | POA: Diagnosis not present

## 2019-05-11 DIAGNOSIS — I119 Hypertensive heart disease without heart failure: Secondary | ICD-10-CM | POA: Diagnosis not present

## 2019-05-11 DIAGNOSIS — I471 Supraventricular tachycardia: Secondary | ICD-10-CM | POA: Diagnosis not present

## 2019-05-11 DIAGNOSIS — I25110 Atherosclerotic heart disease of native coronary artery with unstable angina pectoris: Secondary | ICD-10-CM | POA: Diagnosis not present

## 2019-05-16 DIAGNOSIS — D649 Anemia, unspecified: Secondary | ICD-10-CM | POA: Diagnosis not present

## 2019-05-16 DIAGNOSIS — Z48815 Encounter for surgical aftercare following surgery on the digestive system: Secondary | ICD-10-CM | POA: Diagnosis not present

## 2019-05-16 DIAGNOSIS — I119 Hypertensive heart disease without heart failure: Secondary | ICD-10-CM | POA: Diagnosis not present

## 2019-05-16 DIAGNOSIS — I471 Supraventricular tachycardia: Secondary | ICD-10-CM | POA: Diagnosis not present

## 2019-05-16 DIAGNOSIS — F329 Major depressive disorder, single episode, unspecified: Secondary | ICD-10-CM | POA: Diagnosis not present

## 2019-05-16 DIAGNOSIS — I25110 Atherosclerotic heart disease of native coronary artery with unstable angina pectoris: Secondary | ICD-10-CM | POA: Diagnosis not present

## 2019-06-08 DIAGNOSIS — D510 Vitamin B12 deficiency anemia due to intrinsic factor deficiency: Secondary | ICD-10-CM | POA: Diagnosis not present

## 2019-07-01 DIAGNOSIS — I10 Essential (primary) hypertension: Secondary | ICD-10-CM | POA: Diagnosis not present

## 2019-07-01 DIAGNOSIS — Z23 Encounter for immunization: Secondary | ICD-10-CM | POA: Diagnosis not present

## 2019-07-01 DIAGNOSIS — E038 Other specified hypothyroidism: Secondary | ICD-10-CM | POA: Diagnosis not present

## 2019-07-01 DIAGNOSIS — R7301 Impaired fasting glucose: Secondary | ICD-10-CM | POA: Diagnosis not present

## 2019-07-01 DIAGNOSIS — D510 Vitamin B12 deficiency anemia due to intrinsic factor deficiency: Secondary | ICD-10-CM | POA: Diagnosis not present

## 2019-07-07 DIAGNOSIS — D510 Vitamin B12 deficiency anemia due to intrinsic factor deficiency: Secondary | ICD-10-CM | POA: Diagnosis not present

## 2019-07-08 DIAGNOSIS — I129 Hypertensive chronic kidney disease with stage 1 through stage 4 chronic kidney disease, or unspecified chronic kidney disease: Secondary | ICD-10-CM | POA: Diagnosis not present

## 2019-07-08 DIAGNOSIS — N182 Chronic kidney disease, stage 2 (mild): Secondary | ICD-10-CM | POA: Diagnosis not present

## 2019-07-08 DIAGNOSIS — I2510 Atherosclerotic heart disease of native coronary artery without angina pectoris: Secondary | ICD-10-CM | POA: Diagnosis not present

## 2019-07-08 DIAGNOSIS — M545 Low back pain: Secondary | ICD-10-CM | POA: Diagnosis not present

## 2019-07-08 DIAGNOSIS — D510 Vitamin B12 deficiency anemia due to intrinsic factor deficiency: Secondary | ICD-10-CM | POA: Diagnosis not present

## 2019-07-08 DIAGNOSIS — M1990 Unspecified osteoarthritis, unspecified site: Secondary | ICD-10-CM | POA: Diagnosis not present

## 2019-07-08 DIAGNOSIS — G8929 Other chronic pain: Secondary | ICD-10-CM | POA: Diagnosis not present

## 2019-07-08 DIAGNOSIS — Z0000 Encounter for general adult medical examination without abnormal findings: Secondary | ICD-10-CM | POA: Diagnosis not present

## 2019-07-08 DIAGNOSIS — R410 Disorientation, unspecified: Secondary | ICD-10-CM | POA: Diagnosis not present

## 2019-07-08 DIAGNOSIS — E039 Hypothyroidism, unspecified: Secondary | ICD-10-CM | POA: Diagnosis not present

## 2019-07-08 DIAGNOSIS — R0602 Shortness of breath: Secondary | ICD-10-CM | POA: Diagnosis not present

## 2019-07-08 DIAGNOSIS — R7301 Impaired fasting glucose: Secondary | ICD-10-CM | POA: Diagnosis not present

## 2019-08-03 DIAGNOSIS — D510 Vitamin B12 deficiency anemia due to intrinsic factor deficiency: Secondary | ICD-10-CM | POA: Diagnosis not present

## 2019-08-15 DIAGNOSIS — K130 Diseases of lips: Secondary | ICD-10-CM | POA: Diagnosis not present

## 2019-08-15 DIAGNOSIS — L738 Other specified follicular disorders: Secondary | ICD-10-CM | POA: Diagnosis not present

## 2019-08-15 DIAGNOSIS — I10 Essential (primary) hypertension: Secondary | ICD-10-CM | POA: Diagnosis not present

## 2019-08-17 ENCOUNTER — Other Ambulatory Visit: Payer: Self-pay

## 2019-08-17 DIAGNOSIS — Z20822 Contact with and (suspected) exposure to covid-19: Secondary | ICD-10-CM

## 2019-08-18 LAB — NOVEL CORONAVIRUS, NAA: SARS-CoV-2, NAA: NOT DETECTED

## 2019-09-04 IMAGING — NM NM PULMONARY VENT & PERF
15 series · 15 of 15 positions shown · non-contrast
Comparison: Chest x-ray from earlier in the same day.

CLINICAL DATA: Chest pain and shortness of Breath

EXAM:
NUCLEAR MEDICINE VENTILATION - PERFUSION LUNG SCAN
TECHNIQUE: Ventilation images were obtained in multiple projections using
inhaled aerosol Ec-DDm DTPA. Perfusion images were obtained in
multiple projections after intravenous injection of Ec-DDm MAA.
RADIOPHARMACEUTICALS:  32 mCi of Ec-DDm DTPA aerosol inhalation and
4.1 mCi 4cBBm MAA IV

[Series 1: ant/post vent · 4.14mm/px · 1 of 1 slices shown]
[im 1/1]
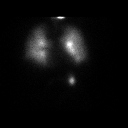

[Series 2: lao/rpo vent · 4.14mm/px · 1 of 1 slices shown (1 of 2)]
[im 1/1]
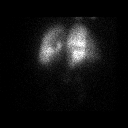

[Series 2: lao/rpo vent · 4.14mm/px · 1 of 1 slices shown (2 of 2)]
[im 1/1]
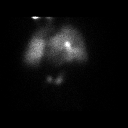

[Series 3: lpo/rao vent · 4.14mm/px · 1 of 1 slices shown (1 of 2)]
[im 1/1]
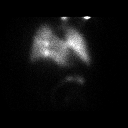

[Series 3: lpo/rao vent · 4.14mm/px · 1 of 1 slices shown (2 of 2)]
[im 1/1]
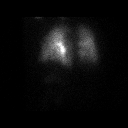

[Series 4: lt lat/rt lat vent · 4.14mm/px · 1 of 1 slices shown (1 of 2)]
[im 1/1]
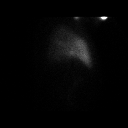

[Series 4: lt lat/rt lat vent · 4.14mm/px · 1 of 1 slices shown (2 of 2)]
[im 1/1]
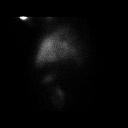

[Series 5: lt lat/rt lat perf · 4.14mm/px · 1 of 1 slices shown (1 of 2)]
[im 1/1]
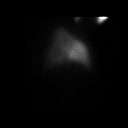

[Series 5: lt lat/rt lat perf · 4.14mm/px · 1 of 1 slices shown (2 of 2)]
[im 1/1]
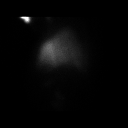

[Series 6: lpo/rao perf · 4.14mm/px · 1 of 1 slices shown (1 of 2)]
[im 1/1]
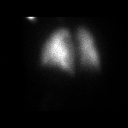

[Series 6: lpo/rao perf · 4.14mm/px · 1 of 1 slices shown (2 of 2)]
[im 1/1]
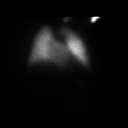

[Series 7: ant/post perf · 4.14mm/px · 1 of 1 slices shown (1 of 2)]
[im 1/1]
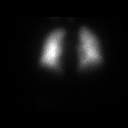

[Series 7: ant/post perf · 4.14mm/px · 1 of 1 slices shown (2 of 2)]
[im 1/1]
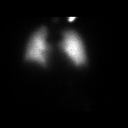

[Series 8: lao/rpo perf · 4.14mm/px · 1 of 1 slices shown (1 of 2)]
[im 1/1]
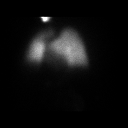

[Series 8: lao/rpo perf · 4.14mm/px · 1 of 1 slices shown (2 of 2)]
[im 1/1]
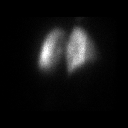

[15 of 15 positions shown; findings below may reference images not displayed]

FINDINGS: Ventilation: No focal ventilation defect.

Perfusion: No wedge shaped peripheral perfusion defects to suggest
acute pulmonary embolism.
IMPRESSION: No ventilation perfusion mismatch to suggest pulmonary embolism.

## 2019-09-13 ENCOUNTER — Encounter: Payer: Self-pay | Admitting: Physician Assistant

## 2019-09-28 ENCOUNTER — Encounter: Payer: Self-pay | Admitting: Physician Assistant

## 2019-09-28 ENCOUNTER — Other Ambulatory Visit (INDEPENDENT_AMBULATORY_CARE_PROVIDER_SITE_OTHER): Payer: Medicare Other

## 2019-09-28 ENCOUNTER — Ambulatory Visit (INDEPENDENT_AMBULATORY_CARE_PROVIDER_SITE_OTHER): Payer: Medicare Other | Admitting: Physician Assistant

## 2019-09-28 VITALS — BP 130/80 | HR 64 | Temp 97.0°F | Ht 72.0 in | Wt 200.0 lb

## 2019-09-28 DIAGNOSIS — R109 Unspecified abdominal pain: Secondary | ICD-10-CM

## 2019-09-28 DIAGNOSIS — R142 Eructation: Secondary | ICD-10-CM

## 2019-09-28 DIAGNOSIS — R1110 Vomiting, unspecified: Secondary | ICD-10-CM

## 2019-09-28 DIAGNOSIS — D510 Vitamin B12 deficiency anemia due to intrinsic factor deficiency: Secondary | ICD-10-CM | POA: Diagnosis not present

## 2019-09-28 DIAGNOSIS — I200 Unstable angina: Secondary | ICD-10-CM

## 2019-09-28 LAB — COMPREHENSIVE METABOLIC PANEL
ALT: 8 U/L (ref 0–53)
AST: 10 U/L (ref 0–37)
Albumin: 4.4 g/dL (ref 3.5–5.2)
Alkaline Phosphatase: 60 U/L (ref 39–117)
BUN: 17 mg/dL (ref 6–23)
CO2: 25 mEq/L (ref 19–32)
Calcium: 9.7 mg/dL (ref 8.4–10.5)
Chloride: 105 mEq/L (ref 96–112)
Creatinine, Ser: 1.15 mg/dL (ref 0.40–1.50)
GFR: 61.16 mL/min (ref >60.00)
Glucose, Bld: 99 mg/dL (ref 70–99)
Potassium: 3.9 mEq/L (ref 3.5–5.1)
Sodium: 138 mEq/L (ref 135–145)
Total Bilirubin: 0.7 mg/dL (ref 0.2–1.2)
Total Protein: 7 g/dL (ref 6.0–8.3)

## 2019-09-28 LAB — CBC WITH DIFFERENTIAL/PLATELET
Basophils Absolute: 0.1 10*3/uL (ref 0.0–0.1)
Basophils Relative: 1.1 % (ref 0.0–3.0)
Eosinophils Absolute: 0.2 10*3/uL (ref 0.0–0.7)
Eosinophils Relative: 2 % (ref 0.0–5.0)
HCT: 43.7 % (ref 39.0–52.0)
Hemoglobin: 14.7 g/dL (ref 13.0–17.0)
Lymphocytes Relative: 17.1 % (ref 12.0–46.0)
Lymphs Abs: 1.7 10*3/uL (ref 0.7–4.0)
MCHC: 33.7 g/dL (ref 30.0–36.0)
MCV: 91.5 fl (ref 78.0–100.0)
Monocytes Absolute: 1.3 10*3/uL — ABNORMAL HIGH (ref 0.1–1.0)
Monocytes Relative: 13.7 % — ABNORMAL HIGH (ref 3.0–12.0)
Neutro Abs: 6.5 10*3/uL (ref 1.4–7.7)
Neutrophils Relative %: 66.1 % (ref 43.0–77.0)
Platelets: 262 10*3/uL (ref 150.0–400.0)
RBC: 4.78 Mil/uL (ref 4.22–5.81)
RDW: 13.3 % (ref 11.5–15.5)
WBC: 9.8 10*3/uL (ref 4.0–10.5)

## 2019-09-28 NOTE — Patient Instructions (Signed)
If you are age 80 or older, your body mass index should be between 23-30. Your Body mass index is 27.12 kg/m. If this is out of the aforementioned range listed, please consider follow up with your Primary Care Provider.  If you are age 31 or younger, your body mass index should be between 19-25. Your Body mass index is 27.12 kg/m. If this is out of the aformentioned range listed, please consider follow up with your Primary Care Provider.   Your provider has requested that you go to the basement level for lab work before leaving today. Press "B" on the elevator. The lab is located at the first door on the left as you exit the elevator.  You have been scheduled for a CT scan of the abdomen and pelvis at [facility]. You are scheduled on 10/05/19 at 1 pm. You should arrive 15 minutes prior to your appointment time for registration. Please follow the written instructions below on the day of your exam:  WARNING: IF YOU ARE ALLERGIC TO IODINE/X-RAY DYE, PLEASE NOTIFY RADIOLOGY IMMEDIATELY AT [phone]! YOU WILL BE GIVEN A 13 HOUR PREMEDICATION PREP.  1) Do not eat or drink anything after 9 am (4 hours prior to your test) 2) You have been given 2 bottles of oral contrast to drink. The solution Passow taste better if refrigerated, but do NOT add ice or any other liquid to this solution. Shake well before drinking.    Drink 1 bottle of contrast @ 11 am (2 hours prior to your exam)  Drink 1 bottle of contrast @ 12 pm (1 hour prior to your exam)  You Hagos take any medications as prescribed with a small amount of water, if necessary. If you take any of the following medications: METFORMIN, GLUCOPHAGE, GLUCOVANCE, AVANDAMET, RIOMET, FORTAMET, [place] MET, JANUMET, GLUMETZA or METAGLIP, you [place] be asked to HOLD this medication 48 hours AFTER the exam.  The purpose of you drinking the oral contrast is to aid in the visualization of your intestinal tract. The contrast solution Fullen cause some diarrhea.  Depending on your individual set of symptoms, you Warehime also receive an intravenous injection of x-ray contrast/dye. Plan on being at [facility] for 30 minutes or longer, depending on the type of exam you are having performed.  This test typically takes 30-45 minutes to complete.  If you have any questions regarding your exam or if you need to reschedule, you Schauf call the CT department at [phone] between the hours of 8:00 am and 5:00 pm, Monday-Friday.  ______________________________________________________________ Continue Protonix 40 mg every morning.  Start Gas-X 1-2 before each meal.  Avoid triggers like salads.  Thank you for choosing me and [facility].   Amy Esterwood, PA-C

## 2019-09-28 NOTE — Progress Notes (Signed)
Subjective:    Patient ID: Johnny [name], male    DOB: [birth year], 78 y.o.   MRN: [id number]  HPI Press is a pleasant 78 year old white male, established with Dr. Henrene [name] and last seen here in 2012 when he had endoscopy and colonoscopy. He has history of coronary artery disease, status post previous PCI and maintained on aspirin and Plavix.  Also with congestive heart failure with EF of 50 to 55%, hypertension, history of SVT, chronic kidney disease stage II and chronic GERD. He underwent cholecystectomy in [place] 2020.  Preop ultrasound had shown somewhat distended gallbladder with mild wall thickening up to 5.1 mm and 1 1.6 cm stone noted within the gallbladder.  IOC at the time of surgery was negative. He developed right upper quadrant pain after his cholecystectomy and had CT of the abdomen and pelvis done a couple of days postoperatively in [place] 2012 that did show some pancreatic head calcifications consistent with chronic pancreatitis, no pancreatic duct dilation.  There was no fluid collection in the gallbladder fossa, no biliary dilation.  There was dilation of the right and transverse colon felt to represent a postop ileus.  Patient says he has been having episodes of mid abdominal pain over the past year, and that has persisted and actually worsened since the gallbladder surgery.  He is not a great historian but his caregiver who accompanies him says that he has been having more frequent symptoms over the past several months.  He also has intermittent episodes of vomiting that come on fairly acutely without any prolonged nausea.  He seems to be having daily episodes of a grabbing mid abdominal pain that causes him to groan and then he has several episodes of belching and will generally feel better.  He does have increase in symptoms postprandially at times but will also have episodes on an empty stomach.  He describes it as feeling like he has "rocks in his stomach".  No fever or chills, no ongoing  diarrhea though he has had occasional episodes.  Appetite overall okay.  He did lose some weight around the time of the cholecystectomy but has actually gained since.  He has been on pantoprazole chronically, has been taking Pepcid AC as needed and Gas-X as needed   Review of Systems Pertinent positive and negative review of systems were noted in the above HPI section.  All other review of systems was otherwise negative.  Outpatient Encounter Medications as of 09/28/2019  Medication Sig  . acetaminophen (TYLENOL) 650 MG CR tablet Take 1,300 mg by mouth 2 (two) times daily.   Johnny [name] ELLIPTA 62.5-25 MCG/INH AEPB Take 1 puff by mouth daily as needed (shortness of breath).   [name] aspirin 81 MG tablet Take 81 mg by mouth daily.    [name] atorvastatin (LIPITOR) 10 MG tablet Take 1 tablet (10 mg total) by mouth daily.  . Cholecalciferol (VITAMIN D3) 50 MCG (2000 UT) capsule Take 2,000 Units by mouth every evening.  . clopidogrel (PLAVIX) 75 MG tablet TAKE 1 TABLET BY MOUTH EVERY DAY (Patient taking differently: Take 75 mg by mouth daily. )  . Cyanocobalamin (VITAMIN B-12 IJ) Inject 1 Dose as directed every 30 (thirty) days.   . diclofenac sodium (VOLTAREN) 1 % GEL Apply 2 g topically 4 (four) times daily. (Patient taking differently: Apply 2 g topically 4 (four) times daily as needed (hip pain). )  . DULoxetine (CYMBALTA) 30 MG capsule Take 30 mg by mouth every evening.  . furosemide (LASIX) 20  MG tablet Take 1 tablet (20 mg total) by mouth daily as needed for fluid or edema (or SOB or weight gain).  . Glucos-Chond-Hyal Ac-Ca Fructo (MOVE FREE JOINT HEALTH ADVANCE PO) Take 1 capsule by mouth 2 (two) times daily.  . hydrOXYzine (ATARAX) 25 MG tablet Take 25 mg by mouth at bedtime.   [name] levothyroxine (SYNTHROID, LEVOTHROID) 25 MCG tablet Take 25 mcg by mouth daily.    [name] lisinopril (PRINIVIL,ZESTRIL) 20 MG tablet Take 1 tablet (20 mg total) by mouth daily.  [name] loperamide (IMODIUM A-D) 2 MG tablet Take 2-4 mg by  mouth as needed for diarrhea or loose stools.  . meclizine (ANTIVERT) 25 MG tablet Take 25 mg by mouth 2 (two) times daily as needed for dizziness or nausea.   . metoprolol tartrate (LOPRESSOR) 50 MG tablet Take 1.5 tablets (75 mg total) by mouth 2 (two) times daily.  . nitroGLYCERIN (NITROSTAT) 0.4 MG SL tablet Place 1 tablet (0.4 mg total) under the tongue every 5 (five) minutes as needed for chest pain.  [name] ondansetron (ZOFRAN) 4 MG tablet Take 1 tablet (4 mg total) by mouth every 8 (eight) hours as needed for nausea or vomiting.  . pantoprazole (PROTONIX) 40 MG tablet Take 1 tablet (40 mg total) by mouth daily. NEED OV.  . Probiotic Product (PROBIOTIC-10 PO) Take by mouth daily.  [name] senna (SENOKOT) 8.6 MG TABS tablet Take 1 tablet by mouth daily as needed for mild constipation.   . traMADol (ULTRAM) 50 MG tablet Take 1-2 tablets (50-100 mg total) by mouth every 6 (six) hours as needed for moderate pain or severe pain.  . [DISCONTINUED] Magnesium 400 MG CAPS Take 400 mg by mouth every evening.   No facility-administered encounter medications on file as of 09/28/2019.    No Known Allergies Patient Active Problem List   Diagnosis Date Noted  . Acute metabolic encephalopathy 97/98/9211  . Acute on chronic cholecystitis s/p lap cholecystectomy 03/23/2019 03/23/2019  . Chronic anticoagulation 03/23/2019  . Chronic cholecystitis 03/23/2019  . Unstable angina ([place]) 11/05/2018  . Tachycardia 10/29/2018  . Pain in left hip 08/13/2018  . Hypothyroidism 11/04/2011  . SVT (supraventricular tachycardia) ([place]) 04/10/2011  . HTN (hypertension) 04/10/2011  . Dyslipidemia 04/10/2011  . Coronary atherosclerosis 11/25/2010  . GERD 11/25/2010  . CONSTIPATION 11/25/2010  . CHEST PAIN UNSPECIFIED 11/25/2010  . PERSONAL HISTORY OF COLONIC POLYPS 11/25/2010   Social History   Socioeconomic History  . Marital status: Married    Spouse name: Not on file  . Number of children: 2  . Years of education: Not  on file  . Highest education level: Not on file  Occupational History    Employer: RETIRED  Social Needs  . Financial resource strain: Not on file  . Food insecurity    Worry: Not on file    Inability: Not on file  . Transportation needs    Medical: Not on file    Non-medical: Not on file  Tobacco Use  . Smoking status: Former Smoker    Packs/day: 1.00    Years: 21.00    Pack years: 21.00    Types: Cigarettes    Quit date: 04/07/1976    Years since quitting: 43.5  . Smokeless tobacco: Never Used  Substance and Sexual Activity  . Alcohol use: Not Currently    Comment: 10/15/11 "haven't drank in 15-20 years"  . Drug use: No  . Sexual activity: Not Currently  Lifestyle  . Physical activity    Days  per week: Not on file    Minutes per session: Not on file  . Stress: Not on file  Relationships  . Social Herbalist on phone: Not on file    Gets together: Not on file    Attends religious service: Not on file    Active member of club or organization: Not on file    Attends meetings of clubs or organizations: Not on file    Relationship status: Not on file  . Intimate partner violence    Fear of current or ex partner: Not on file    Emotionally abused: Not on file    Physically abused: Not on file    Forced sexual activity: Not on file  Other Topics Concern  . Not on file  Social History Narrative  . Not on file    Mr. Ruane family history includes Diabetes in his father; Heart attack in his father; Kidney failure in his father; Stroke in his mother.      Objective:    Vitals:   09/28/19 1033  BP: 130/80  Pulse: 64  Temp: (!) 97 F (36.1 C)    Physical Exam Well-developed well-nourished elderly white male in no acute distress..  Accompanied by his caregiver height, Weight, 200 BMI 27.1  HEENT; nontraumatic normocephalic, EOMI, PER R LA, sclera anicteric. Oropharynx; not examined/mask/Covid Neck; supple, no JVD Cardiovascular; regular rate and  rhythm with S1-S2, no murmur rub or gallop Pulmonary; Clear bilaterally Abdomen; soft,  nondistended, no palpable mass or hepatosplenomegaly, bowel sounds are active.  There is some mild periumbilical tenderness, healed incisional ports from prior laparoscopic cholecystectomy, no definite umbilical or periumbilical hernia Rectal; not done today Skin; benign exam, no jaundice rash or appreciable lesions Extremities; no clubbing cyanosis or edema skin warm and dry Neuro/Psych; alert and oriented x4, grossly nonfocal mood and affect appropriate       Assessment & Plan:     #79 78 year old white male status post laparoscopic cholecystectomy [place] 2020, with persistent episodes of mid abdominal pain which were present prior to the cholecystectomy but have increased in frequency over the past several months.  This seems to be a transient pain followed by episodes of belching that are occurring on a daily basis.  He is also had random intermittent episodes of vomiting with no ongoing nausea.  Etiology of symptoms is not entirely clear.  Rule out periumbilical hernia, symptomatic, rule out pain secondary to adhesions, rule out intermittent low-grade obstruction. #2 pancreatic head calcifications consistent with chronic pancreatitis by prior CT.  Current symptoms not typical of chronic pancreatitis  #3 GERD stable  #4 remote history of adenomatous colon polyps-last colonoscopy February 2012 normal.  He was indicated for 5-year interval follow-up.  He asks about need for follow-up colonoscopy nail. #5 coronary artery disease status post stent-maintained on aspirin and Plavix 6.  History of B12 deficiency 7.  Chronic kidney disease stage II  Plan; CBC, c-Met, Schedule for CT of the abdomen and pelvis with contrast Continue Protonix 40 mg every morning, stop Pepcid. Start Gas-X 1-2 prior to every meal routinely. Further plans pending findings on CT. Regarding follow-up colonoscopy patient is now 58.   Routine follow-up colonoscopy is generally are not pursued after age 62 and this was explained to patient.  In light of current COVID-19 surge, advised that would not plan to pursue colonoscopy this winter, if he wishes to discuss further, at a later date and after we sort out his more acute  symptoms, this can be discussed.  Amy Genia Harold PA-C 09/28/2019   Cc: Burnard Bunting, MD

## 2019-09-29 NOTE — Progress Notes (Signed)
Assessment and plan reviewed 

## 2019-10-03 DIAGNOSIS — L570 Actinic keratosis: Secondary | ICD-10-CM | POA: Diagnosis not present

## 2019-10-03 DIAGNOSIS — L821 Other seborrheic keratosis: Secondary | ICD-10-CM | POA: Diagnosis not present

## 2019-10-03 DIAGNOSIS — L814 Other melanin hyperpigmentation: Secondary | ICD-10-CM | POA: Diagnosis not present

## 2019-10-03 DIAGNOSIS — L720 Epidermal cyst: Secondary | ICD-10-CM | POA: Diagnosis not present

## 2019-10-03 DIAGNOSIS — L438 Other lichen planus: Secondary | ICD-10-CM | POA: Diagnosis not present

## 2019-10-03 DIAGNOSIS — D1801 Hemangioma of skin and subcutaneous tissue: Secondary | ICD-10-CM | POA: Diagnosis not present

## 2019-10-05 ENCOUNTER — Emergency Department (HOSPITAL_COMMUNITY)
Admission: EM | Admit: 2019-10-05 | Discharge: 2019-10-05 | Disposition: A | Discharge status: Home / Self Care | Payer: Medicare Other | Attending: Emergency Medicine | Admitting: Emergency Medicine

## 2019-10-05 ENCOUNTER — Emergency Department (HOSPITAL_COMMUNITY)
Admission: RE | Admit: 2019-10-05 | Discharge: 2019-10-05 | Disposition: A | Discharge status: Home / Self Care | Payer: Medicare Other | Source: Ambulatory Visit | Attending: Physician Assistant | Admitting: Physician Assistant

## 2019-10-05 ENCOUNTER — Emergency Department (HOSPITAL_COMMUNITY): Payer: Medicare Other

## 2019-10-05 ENCOUNTER — Other Ambulatory Visit: Payer: Self-pay

## 2019-10-05 DIAGNOSIS — E039 Hypothyroidism, unspecified: Secondary | ICD-10-CM | POA: Insufficient documentation

## 2019-10-05 DIAGNOSIS — R142 Eructation: Secondary | ICD-10-CM | POA: Insufficient documentation

## 2019-10-05 DIAGNOSIS — Y999 Unspecified external cause status: Secondary | ICD-10-CM | POA: Diagnosis not present

## 2019-10-05 DIAGNOSIS — R109 Unspecified abdominal pain: Secondary | ICD-10-CM

## 2019-10-05 DIAGNOSIS — W06XXXA Fall from bed, initial encounter: Secondary | ICD-10-CM | POA: Diagnosis not present

## 2019-10-05 DIAGNOSIS — R1110 Vomiting, unspecified: Secondary | ICD-10-CM

## 2019-10-05 DIAGNOSIS — Z79899 Other long term (current) drug therapy: Secondary | ICD-10-CM | POA: Insufficient documentation

## 2019-10-05 DIAGNOSIS — I10 Essential (primary) hypertension: Secondary | ICD-10-CM | POA: Diagnosis not present

## 2019-10-05 DIAGNOSIS — Z87891 Personal history of nicotine dependence: Secondary | ICD-10-CM | POA: Insufficient documentation

## 2019-10-05 DIAGNOSIS — Z7982 Long term (current) use of aspirin: Secondary | ICD-10-CM | POA: Diagnosis not present

## 2019-10-05 DIAGNOSIS — S0181XA Laceration without foreign body of other part of head, initial encounter: Secondary | ICD-10-CM | POA: Diagnosis present

## 2019-10-05 DIAGNOSIS — S0990XA Unspecified injury of head, initial encounter: Secondary | ICD-10-CM | POA: Insufficient documentation

## 2019-10-05 DIAGNOSIS — Y939 Activity, unspecified: Secondary | ICD-10-CM | POA: Insufficient documentation

## 2019-10-05 DIAGNOSIS — I2510 Atherosclerotic heart disease of native coronary artery without angina pectoris: Secondary | ICD-10-CM | POA: Diagnosis not present

## 2019-10-05 DIAGNOSIS — Y92003 Bedroom of unspecified non-institutional (private) residence as the place of occurrence of the external cause: Secondary | ICD-10-CM | POA: Diagnosis not present

## 2019-10-05 DIAGNOSIS — S0191XA Laceration without foreign body of unspecified part of head, initial encounter: Secondary | ICD-10-CM

## 2019-10-05 MED ORDER — CIPROFLOXACIN HCL 500 MG PO TABS: 500.0000 mg | ORAL_TABLET | Freq: Two times a day (BID) | ORAL | 0 refills | Status: DC

## 2019-10-05 MED ORDER — BACITRACIN ZINC 500 UNIT/GM EX OINT
TOPICAL_OINTMENT | Freq: Once | CUTANEOUS | Status: AC
  Administered 2019-10-05: 1 via TOPICAL
  Filled 2019-10-05: qty 0.9

## 2019-10-05 MED ORDER — LIDOCAINE-EPINEPHRINE (PF) 2 %-1:200000 IJ SOLN
10.0000 mL | Freq: Once | INTRAMUSCULAR | Status: AC
  Administered 2019-10-05: 10 mL
  Filled 2019-10-05: qty 10

## 2019-10-05 MED ORDER — SODIUM CHLORIDE (PF) 0.9 % IJ SOLN
INTRAMUSCULAR | Status: AC
  Filled 2019-10-05: qty 50

## 2019-10-05 MED ORDER — IOHEXOL 300 MG/ML  SOLN
100.0000 mL | Freq: Once | INTRAMUSCULAR | Status: AC | PRN
  Administered 2019-10-05: 14:00:00 100 mL via INTRAVENOUS

## 2019-10-05 MED ORDER — METRONIDAZOLE 500 MG PO TABS: 500.0000 mg | ORAL_TABLET | Freq: Two times a day (BID) | ORAL | 0 refills | Status: DC

## 2019-10-05 NOTE — Discharge Instructions (Signed)
Please read the attachment on sutured wound care.  You will need to have your sutures removed in 7 days, either by your PCP, urgent care, or ED.  You also need to follow-up with your gastroenterologist regarding your abdominal symptoms.  Please take your medications, as prescribed.  Return to the ED or seek medical attention for any new or worsening symptoms.

## 2019-10-05 NOTE — ED Notes (Addendum)
Pt verbalizes understanding of DC instructions. Pt belongings returned and assisted in wheelchair out of ED.  

## 2019-10-05 NOTE — ED Notes (Signed)
Donna Christen PA at bedside

## 2019-10-05 NOTE — ED Provider Notes (Signed)
[facility] DEPT Provider Note   CSN: [id number] Arrival date & time: 10/05/19  1357     History   Chief Complaint Chief Complaint  Patient presents with  . Head Laceration    HPI Johnny [name] is a 78 y.o. male with PMH significant for CAD, HLD, HTN, NSVT currently on Plavix who presents to the ED with complaints of head laceration secondary to mechanical fall.  Patient reportedly was getting dressed to go for outpatient imaging to assess a 83-month history of intermittent gas and nausea symptoms when he fell while putting on his pants at the edge of his bed and subsequently hit his forehead on the knob of his dresser.  Patient denies any current headache, neck stiffness, neck pain, blurred vision, numbness or tingling, loss of consciousness, memory impairment, or pain with EOMs.  There appear to be two bleeding lacerations above his right eyebrow.  He denies any current nausea, fevers or chills, headache or dizziness, neck pain, chest pain, shortness of breath, or neurologic deficits at this time.  His daughter helped bandage his forehead and brought him to the ED.     HPI  Past Medical History:  Diagnosis Date  . Acromioclavicular joint arthritis   . Anemia   . B12 deficiency 10/15/11   "take injections q 28 days"  . Coronary artery disease    LHC 10/15/11: LAD stent patent with mid ectasia, distal LAD 50-60%, mid circumflex with ectasia, EF 55-65%.  . Dyslipidemia   . GERD (gastroesophageal reflux disease)   . Glucose intolerance (pre-diabetes)    Borderline glucose intolerance  . Hypertension   . Hypothyroidism   . Post concussive syndrome 2011   "for ~ 6-8 months"  . Rotator cuff tear   . Shortness of breath 10/15/11   "here lately I've been having it on & off any time"  . SVT (supraventricular tachycardia) ([place])    maintained on beta blocker    Patient Active Problem List   Diagnosis Date Noted  . Acute metabolic encephalopathy  [id number]  . Acute on chronic cholecystitis s/p lap cholecystectomy 03/23/2019 03/23/2019  . Chronic anticoagulation 03/23/2019  . Chronic cholecystitis 03/23/2019  . Unstable angina ([place]) 11/05/2018  . Tachycardia 10/29/2018  . Pain in left hip 08/13/2018  . Hypothyroidism 11/04/2011  . SVT (supraventricular tachycardia) ([place]) 04/10/2011  . HTN (hypertension) 04/10/2011  . Dyslipidemia 04/10/2011  . Coronary atherosclerosis 11/25/2010  . GERD 11/25/2010  . CONSTIPATION 11/25/2010  . CHEST PAIN UNSPECIFIED 11/25/2010  . PERSONAL HISTORY OF COLONIC POLYPS 11/25/2010    Past Surgical History:  Procedure Laterality Date  . BACK SURGERY    . CARDIAC CATHETERIZATION  12/19/08   NORMAL. EF 55%  . CARDIAC CATHETERIZATION  10/15/11  . CARDIOVASCULAR STRESS TEST  10/2010  . CARPAL TUNNEL RELEASE     bilaterally  . CHOLECYSTECTOMY  03/23/2019  . COLONOSCOPY W/ ENDOSCOPIC [place]    . CORONARY ANGIOPLASTY WITH STENT PLACEMENT  9/ 2005   . JOINT REPLACEMENT    . LAPAROSCOPIC CHOLECYSTECTOMY SINGLE SITE WITH INTRAOPERATIVE CHOLANGIOGRAM N/A 03/23/2019   Procedure: SINGLE SITE LAPAROSCOPIC CHOLECYSTECTOMY WITH INTRAOPERATIVE CHOLANGIOGRAM;  Surgeon: Michael Boston, MD;  Location: [facility];  Service: General;  Laterality: N/A;  . LEFT HEART CATH AND CORONARY ANGIOGRAPHY N/A 11/05/2018   Procedure: LEFT HEART CATH AND CORONARY ANGIOGRAPHY;  Surgeon: Martinique, Peter M, MD;  Location: [facility];  Service: Cardiovascular;  Laterality: N/A;  . LEFT HEART CATHETERIZATION WITH CORONARY ANGIOGRAM N/A  10/15/2011   Procedure: LEFT HEART CATHETERIZATION WITH CORONARY ANGIOGRAM;  Surgeon: Sherren Mocha, MD;  Location: [facility] C[facility];  Service: Cardiovascular;  Laterality: N/A;  . LUMBAR LAMINECTOMY  1970's  . NASAL SINUS SURGERY     x2  . reconstructive shoulder surgery     right  . REPLACEMENT TOTAL KNEE  12/2007   left  . RESECTION TUMOR CLAVICLE RADICAL     Open distal clavicle resection  . SHOULDER  SURGERY  02/11/2011   right  . TRIGGER FINGER RELEASE     ? both hands        Home Medications    Prior to Admission medications   Medication Sig Start Date End Date Taking? Authorizing Provider  acetaminophen (TYLENOL) 650 MG CR tablet Take 1,300 mg by mouth 2 (two) times daily.     [provider]  Celedonio Miyamoto 62.5-25 MCG/INH AEPB Take 1 puff by mouth daily as needed (shortness of breath).  01/06/19   [provider]  aspirin 81 MG tablet Take 81 mg by mouth daily.      [provider]  atorvastatin (LIPITOR) 10 MG tablet Take 1 tablet (10 mg total) by mouth daily. 12/23/18   Martinique, Peter M, MD  Cholecalciferol (VITAMIN D3) 50 MCG (2000 UT) capsule Take 2,000 Units by mouth every evening.    [provider]  ciprofloxacin (CIPRO) 500 MG tablet Take 1 tablet (500 mg total) by mouth every 12 (twelve) hours. 10/05/19   Corena Herter, PA-C  clopidogrel (PLAVIX) 75 MG tablet TAKE 1 TABLET BY MOUTH EVERY DAY Patient taking differently: Take 75 mg by mouth daily.  08/16/18   Martinique, Peter M, MD  Cyanocobalamin (VITAMIN B-12 IJ) Inject 1 Dose as directed every 30 (thirty) days.     [provider]  diclofenac sodium (VOLTAREN) 1 % GEL Apply 2 g topically 4 (four) times daily. Patient taking differently: Apply 2 g topically 4 (four) times daily as needed (hip pain).  08/13/18   Aundra Dubin, PA-C  DULoxetine (CYMBALTA) 30 MG capsule Take 30 mg by mouth every evening.    [provider]  furosemide (LASIX) 20 MG tablet Take 1 tablet (20 mg total) by mouth daily as needed for fluid or edema (or SOB or weight gain). 03/27/19   Mikhail, Velta Addison, DO  Glucos-Chond-Hyal Ac-Ca Fructo (MOVE FREE JOINT HEALTH ADVANCE PO) Take 1 capsule by mouth 2 (two) times daily.    [provider]  hydrOXYzine (ATARAX) 25 MG tablet Take 25 mg by mouth at bedtime.     [provider]  levothyroxine (SYNTHROID, LEVOTHROID) 25 MCG tablet Take 25  mcg by mouth daily.      [provider]  lisinopril (PRINIVIL,ZESTRIL) 20 MG tablet Take 1 tablet (20 mg total) by mouth daily. 11/25/18 11/20/19  Martinique, Peter M, MD  loperamide (IMODIUM A-D) 2 MG tablet Take 2-4 mg by mouth as needed for diarrhea or loose stools.    [provider]  meclizine (ANTIVERT) 25 MG tablet Take 25 mg by mouth 2 (two) times daily as needed for dizziness or nausea.     [provider]  metoprolol tartrate (LOPRESSOR) 50 MG tablet Take 1.5 tablets (75 mg total) by mouth 2 (two) times daily. 11/25/18   Martinique, Peter M, MD  metroNIDAZOLE (FLAGYL) 500 MG tablet Take 1 tablet (500 mg total) by mouth 2 (two) times daily. 10/05/19   Corena Herter, PA-C  nitroGLYCERIN (NITROSTAT) 0.4 MG SL tablet  Place 1 tablet (0.4 mg total) under the tongue every 5 (five) minutes as needed for chest pain. 10/15/11   Larey Dresser, MD  ondansetron (ZOFRAN) 4 MG tablet Take 1 tablet (4 mg total) by mouth every 8 (eight) hours as needed for nausea or vomiting. 01/20/19   Mesner, Corene Cornea, MD  pantoprazole (PROTONIX) 40 MG tablet Take 1 tablet (40 mg total) by mouth daily. NEED OV. 11/25/18   Martinique, Peter M, MD  Probiotic Product (PROBIOTIC-10 PO) Take by mouth daily.    [provider]  senna (SENOKOT) 8.6 MG TABS tablet Take 1 tablet by mouth daily as needed for mild constipation.     [provider]  traMADol (ULTRAM) 50 MG tablet Take 1-2 tablets (50-100 mg total) by mouth every 6 (six) hours as needed for moderate pain or severe pain. 03/23/19   Michael Boston, MD    Family History Family History  Problem Relation Age of Onset  . Stroke Mother   . Heart attack Father   . Diabetes Father   . Kidney failure Father     Social History Social History   Tobacco Use  . Smoking status: Former Smoker    Packs/day: 1.00    Years: 21.00    Pack years: 21.00    Types: Cigarettes    Quit date: 04/07/1976    Years since quitting: 43.5  . Smokeless  tobacco: Never Used  Substance Use Topics  . Alcohol use: Not Currently    Comment: 10/15/11 "haven't drank in 15-20 years"  . Drug use: No     Allergies   Patient has no known allergies.   Review of Systems Review of Systems  Musculoskeletal: Negative for back pain and neck pain.  Skin: Positive for wound.  Neurological: Negative for dizziness and light-headedness.     Physical Exam Updated Vital Signs BP (!) 142/72   Pulse 77   Temp 97.8 F (36.6 C) (Oral)   Resp 18   SpO2 100%   Physical Exam Vitals signs and nursing note reviewed. Exam conducted with a chaperone present.  Constitutional:      Appearance: Normal appearance.  HENT:     Head:     Comments: Patient has large hematoma on right side of forehead.  2 perpendicular bleeding linear lacerations above right eyebrow.  Full depth of wound probed after administration of lidocaine with epinephrine.  Surrounding ecchymoses.  Swollen right upper eyelid. Eyes:     General: No scleral icterus.    Extraocular Movements: Extraocular movements intact.     Conjunctiva/sclera: Conjunctivae normal.     Pupils: Pupils are equal, round, and reactive to light.     Comments: No pain with EOMs.  Neck:     Musculoskeletal: Normal range of motion and neck supple. No neck rigidity or muscular tenderness.  Cardiovascular:     Rate and Rhythm: Normal rate and regular rhythm.     Pulses: Normal pulses.     Heart sounds: Normal heart sounds.  Pulmonary:     Effort: Pulmonary effort is normal.     Breath sounds: Normal breath sounds.  Skin:    General: Skin is dry.     Capillary Refill: Capillary refill takes less than 2 seconds.  Neurological:     Mental Status: He is alert and oriented to person, place, and time. Mental status is at baseline.     GCS: GCS eye subscore is 4. GCS verbal subscore is 5. GCS motor subscore is 6.  Cranial Nerves: No cranial nerve deficit.     Sensory: No sensory deficit.     Motor: No  weakness.     Coordination: Coordination normal.  Psychiatric:        Mood and Affect: Mood normal.        Behavior: Behavior normal.        Thought Content: Thought content normal.          ED Treatments / Results  Labs (all labs ordered are listed, but only abnormal results are displayed) Labs Reviewed - No data to display  EKG None  Radiology Ct Head Wo Contrast  Result Date: 10/05/2019 CLINICAL DATA:  Mechanical fall.  Hit head. EXAM: CT HEAD WITHOUT CONTRAST TECHNIQUE: Contiguous axial images were obtained from the base of the skull through the vertex without intravenous contrast. COMPARISON:  03/25/2019 FINDINGS: Brain: Stable age related cerebral atrophy, ventriculomegaly and periventricular white matter disease. No extra-axial fluid collections are identified. No CT findings for acute hemispheric infarction or intracranial hemorrhage. No mass lesions. The brainstem and cerebellum are normal. Vascular: Vascular calcifications but no aneurysm or hyperdense vessels. Skull: No skull fracture or bone lesions. Sinuses/Orbits: The paranasal sinuses and mastoid air cells are clear. The globes are intact. Other: There is a right supraorbital scalp hematoma without underlying fracture. IMPRESSION: 1. Stable appearing cerebral atrophy, ventriculomegaly and extensive periventricular white matter disease. 2. No acute intracranial findings or skull fracture. 3. Right supraorbital scalp hematoma. Electronically Signed   By: Marijo Sanes M.D.   On: 10/05/2019 19:02   Ct Abdomen Pelvis W Contrast  Result Date: 10/05/2019 CLINICAL DATA:  Mid abdominal pain for several months with intermittent vomiting, altered bowel habits. EXAM: CT ABDOMEN AND PELVIS WITH CONTRAST TECHNIQUE: Multidetector CT imaging of the abdomen and pelvis was performed using the standard protocol following bolus administration of intravenous contrast. CONTRAST:  165mL OMNIPAQUE IOHEXOL 300 MG/ML  SOLN COMPARISON:  03/25/2019  FINDINGS: Lower chest: Previous atelectasis in both lower lobes shown on 03/25/2019 is mostly clear with only mild bandlike subsegmental atelectasis in the posterior basal segments. Mild cylindrical bronchiectasis in the right lower lobe. Descending thoracic aortic and left anterior descending coronary artery atherosclerotic vascular disease. Hepatobiliary: Prior cholecystectomy. The liver appears otherwise unremarkable. No biliary dilatation. Pancreas: Clustered calcifications along the right side of the pancreatic head, likely remote postinflammatory, unchanged. Otherwise unremarkable. Spleen: Unremarkable Adrenals/Urinary Tract: Both adrenal glands appear normal. Bosniak category 1 right kidney lower pole exophytic cyst measuring 5.2 by 4.5 cm on image 40/2, homogeneous internal fluid density. No urinary tract calculi. Urinary bladder appears unremarkable. No significant abnormal renal parenchymal enhancement. Stomach/Bowel: Diverticulum of the distal transverse duodenum. Suspected mild wall thickening in the proximal ascending colon raising the possibility of localized colitis, with relative sparing of the lower portion of the cecum. Terminal ileum appears unremarkable. Vascular/Lymphatic: Aortoiliac atherosclerotic vascular disease. Reproductive: Unremarkable Other: No supplemental non-categorized findings. Musculoskeletal: Lumbar spondylosis and degenerative disc disease at all levels between L2 and S1 with associated impingement at L3-4, L4-5, L5-S1. IMPRESSION: 1. Suspected mild wall thickening in the proximal ascending colon raising the possibility of localized colitis. The terminal ileum appears unremarkable. 2. Multilevel lumbar impingement. 3. Other imaging findings of potential clinical significance: Mild sciatica bronchiectasis in the right lower lobe. Aortic Atherosclerosis (ICD10-I70.0). Left anterior descending coronary atherosclerotic calcification. Simple right kidney lower pole cyst.  Electronically Signed   By: Van Clines M.D.   On: 10/05/2019 14:39    Procedures .Marland KitchenLaceration Repair  Date/Time: 10/05/2019 6:27  PM Performed by: Corena Herter, PA-C Authorized by: Corena Herter, PA-C   Consent:    Consent obtained:  Verbal   Consent given by:  Patient   Risks discussed:  Infection, need for additional repair, poor cosmetic result, poor wound healing, retained foreign body, nerve damage and vascular damage   Alternatives discussed:  No treatment and delayed treatment Universal protocol:    Procedure explained and questions answered to patient or proxy's satisfaction: yes     Relevant documents present and verified: yes     Test results available and properly labeled: yes     Imaging studies available: yes     Required blood products, implants, devices, and special equipment available: yes     Site/side marked: yes     Immediately prior to procedure, a time out was called: yes     Patient identity confirmed:  Verbally with patient Anesthesia (see MAR for exact dosages):    Anesthesia method:  Local infiltration   Local anesthetic:  Lidocaine 2% WITH epi Laceration details:    Location:  Face   Face location:  Forehead   Length (cm):  3 Repair type:    Repair type:  Simple Pre-procedure details:    Preparation:  Patient was prepped and draped in usual sterile fashion Exploration:    Hemostasis achieved with:  Epinephrine, tied off vessels and direct pressure   Wound exploration: wound explored through full range of motion   Treatment:    Area cleansed with:  Saline   Amount of cleaning:  Standard   Irrigation solution:  Sterile saline   Irrigation volume:  50 mL   Irrigation method:  Syringe Skin repair:    Repair method:  Sutures   Suture size:  5-0   Suture material:  Prolene   Suture technique:  Simple interrupted   Number of sutures:  4 Post-procedure details:    Dressing:  Antibiotic ointment and adhesive bandage   Patient tolerance  of procedure:  Tolerated well, no immediate complications   (including critical care time)  Medications Ordered in ED Medications  lidocaine-EPINEPHrine (XYLOCAINE W/EPI) 2 %-1:200000 (PF) injection 10 mL (10 mLs Infiltration Given by Other 10/05/19 1636)  bacitracin ointment (1 application Topical Given 10/05/19 1708)     Initial Impression / Assessment and Plan / ED Course  I have reviewed the triage vital signs and the nursing notes.  Pertinent labs & imaging results that were available during my care of the patient were reviewed by me and considered in my medical decision making (see chart for details).        CT abdomen and pelvis performed with contrast demonstrates mild wall thickening in the proximal ascending colon concerning for localized colitis, but no other acute intra-abdominal abnormalities.  Will treat with short course of ciprofloxacin and Flagyl and instructed to follow-up with [facility] for continued evaluation and management.    Obtained CT head without contrast given head injury while on Plavix.  CT demonstrates supraorbital hematoma, but no intracranial bleed or other acute abnormalities.  Patient is accompanied by his daughter, Johnny [name], who reports that she heard him fall and immediately was able to assist him.  She reports that he was not confused and was infected able to direct her to the hospital.  She is concerned about a head bleed, or, and appreciated noncontrast head CT given Plavix medication.  Patient has an L-shaped laceration approximately 4 cm in total length that was repaired with 4 sutures.  Used 5-0 Prolene.  Recommend that patient have sutures removed in 7 days as opposed to 5 days given presence of hematoma and on blood thinners.  Patient and daughter voiced understanding and are agreeable to the plan.  Before and after the procedure the motor function, strength, sensation, and circulatory function was assessed.  No deficits.  All of  the evaluation and work-up results were discussed with the patient and any family at bedside. They were provided opportunity to ask any additional questions and have none at this time. They have expressed understanding of verbal discharge instructions as well as return precautions and are agreeable to the plan.     Final Clinical Impressions(s) / ED Diagnoses   Final diagnoses:  Laceration of head without foreign body, unspecified part of head, initial encounter    ED Discharge Orders         Ordered    ciprofloxacin (CIPRO) 500 MG tablet  Every 12 hours     10/05/19 1833    metroNIDAZOLE (FLAGYL) 500 MG tablet  2 times daily     10/05/19 1833           Corena Herter, PA-C 10/05/19 Dellia Cloud, MD 10/07/19 0900

## 2019-10-05 NOTE — ED Notes (Signed)
Head dressing applied

## 2019-10-05 NOTE — ED Triage Notes (Addendum)
Pt arrived POV from home assite in wheelchair in ED with CC head Lac r/t mechanical fall. Pt denies new onset neck or back pain, Dizzyness or blurred vision. Pt reports nausea. Pt is on blood thinners with Hx abd surgery.

## 2019-10-05 NOTE — ED Notes (Signed)
Called daughter, she will be coming to room.

## 2019-10-05 NOTE — ED Notes (Signed)
Pt back from CT provided snacks and a drink

## 2019-10-10 ENCOUNTER — Telehealth: Payer: Self-pay | Admitting: Physician Assistant

## 2019-10-11 NOTE — Telephone Encounter (Signed)
Left message to call back. Please refer to the imaging report.

## 2019-11-14 ENCOUNTER — Other Ambulatory Visit: Payer: Self-pay

## 2019-11-14 ENCOUNTER — Encounter: Payer: Self-pay | Admitting: Podiatry

## 2019-11-14 ENCOUNTER — Ambulatory Visit (INDEPENDENT_AMBULATORY_CARE_PROVIDER_SITE_OTHER): Payer: Medicare Other | Admitting: Podiatry

## 2019-11-14 VITALS — BP 135/81 | HR 85 | Resp 16

## 2019-11-14 DIAGNOSIS — L600 Ingrowing nail: Secondary | ICD-10-CM | POA: Diagnosis not present

## 2019-11-14 MED ORDER — NEOMYCIN-POLYMYXIN-HC 1 % OT SOLN: OTIC | 1 refills | Status: DC

## 2019-11-14 NOTE — Patient Instructions (Signed)
Betadine Soak Instructions  Purchase an 8 oz. bottle of BETADINE solution (Povidone)  THE DAY AFTER THE PROCEDURE  Place 1 tablespoon of betadine solution in a quart of warm tap water.  Submerge your foot or feet with outer bandage intact for the initial soak; this will allow the bandage to become moist and wet for easy lift off.  Once you remove your bandage, continue to soak in the solution for 20 minutes.  This soak should be done twice a day.  Next, remove your foot or feet from solution, blot dry the affected area and cover.  You [place] use a band aid large enough to cover the area or use gauze and tape.  Apply other medications to the area as directed by the doctor such as cortisporin otic solution (ear drops) or neosporin.  IF YOUR SKIN BECOMES IRRITATED WHILE USING THESE INSTRUCTIONS, IT IS OKAY TO SWITCH TO EPSOM SALTS AND WATER OR WHITE VINEGAR AND WATER.   Long Term Care Instructions-Post Nail Surgery  You have had your ingrown toenail and root treated with a chemical.  This chemical causes a burn that will drain and ooze like a blister.  This can drain for 6-8 weeks or longer.  It is important to keep this area clean, covered, and follow the soaking instructions dispensed at the time of your surgery.  This area will eventually dry and form a scab.  Once the scab forms you no longer need to soak or apply a dressing.  If at any time you experience an increase in pain, redness, swelling, or drainage, you should contact the office as soon as possible.  

## 2019-11-14 NOTE — Progress Notes (Signed)
Subjective:  Patient ID: Johnny [name], male    DOB: December 15, 1940,  MRN: [id number] HPI Chief Complaint  Patient presents with  . Toe Pain    Hallux bilateral - medial borders - tender, red, swollen, especially the right, using neosporin  . New Patient (Initial Visit)    79 y.o. male presents with the above complaint.   ROS: Denies fever chills nausea vomiting muscle aches pains calf pain back pain chest pain shortness of breath.  Past Medical History:  Diagnosis Date  . Acromioclavicular joint arthritis   . Anemia   . B12 deficiency 10/15/11   "take injections q 28 days"  . Coronary artery disease    LHC 10/15/11: LAD stent patent with mid ectasia, distal LAD 50-60%, mid circumflex with ectasia, EF 55-65%.  . Dyslipidemia   . GERD (gastroesophageal reflux disease)   . Glucose intolerance (pre-diabetes)    Borderline glucose intolerance  . Hypertension   . Hypothyroidism   . Post concussive syndrome 2011   "for ~ 6-8 months"  . Rotator cuff tear   . Shortness of breath 10/15/11   "here lately I've been having it on & off any time"  . SVT (supraventricular tachycardia) ([place])    maintained on beta blocker   Past Surgical History:  Procedure Laterality Date  . BACK SURGERY    . CARDIAC CATHETERIZATION  12/19/08   NORMAL. EF 55%  . CARDIAC CATHETERIZATION  10/15/11  . CARDIOVASCULAR STRESS TEST  10/2010  . CARPAL TUNNEL RELEASE     bilaterally  . CHOLECYSTECTOMY  03/23/2019  . COLONOSCOPY W/ ENDOSCOPIC [place]    . CORONARY ANGIOPLASTY WITH STENT PLACEMENT  9/ 2005   . JOINT REPLACEMENT    . LAPAROSCOPIC CHOLECYSTECTOMY SINGLE SITE WITH INTRAOPERATIVE CHOLANGIOGRAM N/A 03/23/2019   Procedure: SINGLE SITE LAPAROSCOPIC CHOLECYSTECTOMY WITH INTRAOPERATIVE CHOLANGIOGRAM;  Surgeon: Michael Boston, MD;  Location: [facility];  Service: General;  Laterality: N/A;  . LEFT HEART CATH AND CORONARY ANGIOGRAPHY N/A 11/05/2018   Procedure: LEFT HEART CATH AND CORONARY ANGIOGRAPHY;  Surgeon: Martinique,  Peter M, MD;  Location: [place] [facility];  Service: Cardiovascular;  Laterality: N/A;  . LEFT HEART CATHETERIZATION WITH CORONARY ANGIOGRAM N/A 10/15/2011   Procedure: LEFT HEART CATHETERIZATION WITH CORONARY ANGIOGRAM;  Surgeon: Sherren Mocha, MD;  Location: [facility] CATH LAB;  Service: Cardiovascular;  Laterality: N/A;  . LUMBAR LAMINECTOMY  1970's  . NASAL SINUS SURGERY     x2  . reconstructive shoulder surgery     right  . REPLACEMENT TOTAL KNEE  12/2007   left  . RESECTION TUMOR CLAVICLE RADICAL     Open distal clavicle resection  . SHOULDER SURGERY  02/11/2011   right  . TRIGGER FINGER RELEASE     ? both hands    Current Outpatient Medications:  .  acetaminophen (TYLENOL) 650 MG CR tablet, Take 1,300 mg by mouth 2 (two) times daily. , Disp: , Rfl:  .  ANORO ELLIPTA 62.5-25 MCG/INH AEPB, Take 1 puff by mouth daily as needed (shortness of breath). , Disp: , Rfl:  .  ANTIFUNGAL 2 % cream, APPLY TO AFFECTED AREA TWICE A DAY FOR 7 10 DAYS, Disp: , Rfl:  .  aspirin 81 MG tablet, Take 81 mg by mouth daily.  , Disp: , Rfl:  .  atorvastatin (LIPITOR) 10 MG tablet, Take 1 tablet (10 mg total) by mouth daily., Disp: 90 tablet, Rfl: 3 .  Cholecalciferol (VITAMIN D3) 50 MCG (2000 UT) capsule, Take 2,000 Units by  mouth every evening., Disp: , Rfl:  .  clopidogrel (PLAVIX) 75 MG tablet, TAKE 1 TABLET BY MOUTH EVERY DAY (Patient taking differently: Take 75 mg by mouth daily. ), Disp: 90 tablet, Rfl: 1 .  Cyanocobalamin (VITAMIN B-12 IJ), Inject 1 Dose as directed every 30 (thirty) days. , Disp: , Rfl:  .  diclofenac sodium (VOLTAREN) 1 % GEL, Apply 2 g topically 4 (four) times daily. (Patient taking differently: Apply 2 g topically 4 (four) times daily as needed (hip pain). ), Disp: 1 Tube, Rfl: 5 .  DULoxetine (CYMBALTA) 30 MG capsule, Take 30 mg by mouth every evening., Disp: , Rfl:  .  furosemide (LASIX) 20 MG tablet, Take 1 tablet (20 mg total) by mouth daily as needed for fluid or edema (or SOB or  weight gain)., Disp: 30 tablet, Rfl: 0 .  Glucos-Chond-Hyal Ac-Ca Fructo (MOVE FREE JOINT HEALTH ADVANCE PO), Take 1 capsule by mouth 2 (two) times daily., Disp: , Rfl:  .  hydrOXYzine (ATARAX) 25 MG tablet, Take 25 mg by mouth at bedtime. , Disp: , Rfl:  .  levothyroxine (SYNTHROID, LEVOTHROID) 25 MCG tablet, Take 25 mcg by mouth daily.  , Disp: , Rfl:  .  lisinopril (PRINIVIL,ZESTRIL) 20 MG tablet, Take 1 tablet (20 mg total) by mouth daily., Disp: 90 tablet, Rfl: 3 .  loperamide (IMODIUM A-D) 2 MG tablet, Take 2-4 mg by mouth as needed for diarrhea or loose stools., Disp: , Rfl:  .  magnesium oxide (MAG-OX) 400 MG tablet, Take 1 tablet by mouth daily., Disp: , Rfl:  .  meclizine (ANTIVERT) 25 MG tablet, Take 25 mg by mouth 2 (two) times daily as needed for dizziness or nausea. , Disp: , Rfl:  .  metoprolol tartrate (LOPRESSOR) 50 MG tablet, Take 1.5 tablets (75 mg total) by mouth 2 (two) times daily., Disp: 270 tablet, Rfl: 3 .  metroNIDAZOLE (FLAGYL) 500 MG tablet, Take 1 tablet (500 mg total) by mouth 2 (two) times daily., Disp: 10 tablet, Rfl: 0 .  mupirocin ointment (BACTROBAN) 2 %, APPLY TO AFFECTED AREA TWICE A DAY X 7  10 DAYS, Disp: , Rfl:  .  NEOMYCIN-POLYMYXIN-HYDROCORTISONE (CORTISPORIN) 1 % SOLN OTIC solution, Apply 1-2 drops to toe BID after soaking, Disp: 10 mL, Rfl: 1 .  nitroGLYCERIN (NITROSTAT) 0.4 MG SL tablet, Place 1 tablet (0.4 mg total) under the tongue every 5 (five) minutes as needed for chest pain., Disp: 1 tablet, Rfl: 0 .  ondansetron (ZOFRAN) 4 MG tablet, Take 1 tablet (4 mg total) by mouth every 8 (eight) hours as needed for nausea or vomiting., Disp: 12 tablet, Rfl: 0 .  pantoprazole (PROTONIX) 40 MG tablet, Take 1 tablet (40 mg total) by mouth daily. NEED OV., Disp: 180 tablet, Rfl: 1 .  Probiotic Product (PROBIOTIC-10 PO), Take by mouth daily., Disp: , Rfl:  .  senna (SENOKOT) 8.6 MG TABS tablet, Take 1 tablet by mouth daily as needed for mild constipation. , Disp:  , Rfl:  .  SENNA PLUS 8.6-50 MG tablet, Take 1 tablet by mouth 2 (two) times daily., Disp: , Rfl:  .  traMADol (ULTRAM) 50 MG tablet, Take 1-2 tablets (50-100 mg total) by mouth every 6 (six) hours as needed for moderate pain or severe pain., Disp: 20 tablet, Rfl: 0  No Known Allergies Review of Systems Objective:   Vitals:   11/14/19 1020  BP: 135/81  Pulse: 85  Resp: 16    General: Well developed, nourished, in no acute  distress, alert and oriented x3   Dermatological: Skin is warm, dry and supple bilateral. Nails x 10 are well maintained; remaining integument appears unremarkable at this time. There are no open sores, no preulcerative lesions, no rash or signs of infection present.  Sharp incurvated nail margin along the tibial border of the hallux right.  Mild erythema no cellulitis drainage odor no purulence.  Vascular: Dorsalis Pedis artery and Posterior Tibial artery pedal pulses are 2/4 bilateral with immedate capillary fill time. Pedal hair growth present. No varicosities and no lower extremity edema present bilateral.   Neruologic: Grossly intact via light touch bilateral. Vibratory intact via tuning fork bilateral. Protective threshold with Semmes Wienstein monofilament intact to all pedal sites bilateral. Patellar and Achilles deep tendon reflexes 2+ bilateral. No Babinski or clonus noted bilateral.   Musculoskeletal: No gross boney pedal deformities bilateral. No pain, crepitus, or limitation noted with foot and ankle range of motion bilateral. Muscular strength 5/5 in all groups tested bilateral.  Gait: Unassisted, Nonantalgic.    Radiographs:  None taken  Assessment & Plan:   Assessment: Ingrown toenail medial border hallux right.  Plan: Chemical matricectomy was performed to the hallux right today after local anesthetic was administered.  He tolerated procedure well with out complications.  He was given both oral and home-going surgical care and soaking of the toe  as well as a prescription for Cortisporin Otic to be applied twice daily after soaking.  This was also instructed to his care provider and we will follow-up with him in 2 weeks to make sure he is doing well.  If there are any questions or concerns they will notify us immediately.     Edris Friedt T. [place], [place]

## 2019-11-21 ENCOUNTER — Telehealth: Payer: Medicare Other | Admitting: Physician Assistant

## 2019-11-22 DIAGNOSIS — Z1212 Encounter for screening for malignant neoplasm of rectum: Secondary | ICD-10-CM | POA: Diagnosis not present

## 2019-11-28 ENCOUNTER — Other Ambulatory Visit: Payer: Self-pay | Admitting: Cardiology

## 2019-11-30 ENCOUNTER — Ambulatory Visit (INDEPENDENT_AMBULATORY_CARE_PROVIDER_SITE_OTHER): Payer: Medicare Other | Admitting: Podiatry

## 2019-11-30 ENCOUNTER — Other Ambulatory Visit: Payer: Self-pay

## 2019-11-30 ENCOUNTER — Encounter: Payer: Self-pay | Admitting: Podiatry

## 2019-11-30 DIAGNOSIS — L03031 Cellulitis of right toe: Secondary | ICD-10-CM | POA: Diagnosis not present

## 2019-11-30 MED ORDER — DOXYCYCLINE HYCLATE 100 MG PO TABS: 100.0000 mg | ORAL_TABLET | Freq: Two times a day (BID) | ORAL | 0 refills | Status: DC

## 2019-11-30 NOTE — Progress Notes (Signed)
He presents today for follow-up of his matrixectomy hallux right.  States there is really no pain today but it does not really look that good to me.  This is his nurse saying this and he states that he had pain in the toe during the night the night before last.  Objective: Vital signs are stable he is alert and oriented x3 toe demonstrates mild erythema around the procedural site but all in all there is no drainage no purulence no malodor there is some mild erythema and very little in the way of tenderness.  Assessment: Well-healing surgical toe cannot rule out a little underlying superficial infection particularly with the nocturnal pain.  Plan: I went ahead and requested him to continue soaking Epson salts and warm water daily at least apply Corticosporin otic drops after soaking cover during the daytime but leave open at bedtime we will add a doxycycline oral tablet to the regimen and I will follow-up with him again in 2 to 3 weeks.

## 2019-12-01 DIAGNOSIS — D510 Vitamin B12 deficiency anemia due to intrinsic factor deficiency: Secondary | ICD-10-CM | POA: Diagnosis not present

## 2019-12-21 ENCOUNTER — Ambulatory Visit: Payer: Medicare Other | Admitting: Podiatry

## 2019-12-29 DIAGNOSIS — D510 Vitamin B12 deficiency anemia due to intrinsic factor deficiency: Secondary | ICD-10-CM | POA: Diagnosis not present

## 2019-12-30 NOTE — Progress Notes (Signed)
Johnny [name] Date of Birth: 1941-07-17   History of Present Illness: Johnny [name] is seen for follow up SVT and CAD.  He has a history of supraventricular tachycardia. He is s/p stenting of the proximal LAD in 2005 with a Cypher stent. He is on chronic DAPT. He had repeat caths in 2010 and Dec. 2012 with nonobstructive disease.   He was admitted 1/3-10/30/18 with complaints of dyspnea, chest pressure and a "fluttering" sensation. Troponin levels were negative x 3. V/Q scan was low probability for PE. Creatinine was elevated to 1.79. ACEi and diuretic were held. Metoprolol increased for fluttering. There was some consideration for cardiac cath but this was deferred due to elevated creatinine. On follow up he had  persistent anterior chest pressure and tightness like he is wearing a seatbelt that is too tight. BP has been running higher. He has no energy and complains of SOB.  Due to these persistent symptoms and improvement in renal function he did undergo a cardiac cath on 11/05/18 showing nonobstructive CAD.  In [place] he underwent laproscopic cholecystectomy. Initially did well but returned the day after DC with confusion and metabolic encephalopathy. Oxygen level was low. CT negative for PE. Echo stable. BNP mildly elevated as were troponin levels. CXR some atx. CT head was OK. DC on 5/30.   On follow up today he is still weak. He walks with a walker. He denies  SOB or  chest pain or edema. His appetite has returned and he has gained his weight back.[name] He is getting PT.    Current Outpatient Medications on File Prior to Visit  Medication Sig Dispense Refill  . acetaminophen (TYLENOL) 650 MG CR tablet Take 1,300 mg by mouth 2 (two) times daily.     Jearl Klinefelter ELLIPTA 62.5-25 MCG/INH AEPB Take 1 puff by mouth daily as needed (shortness of breath).     . ANTIFUNGAL 2 % cream APPLY TO AFFECTED AREA TWICE A DAY FOR 7 10 DAYS    . aspirin 81 MG tablet Take 81 mg by mouth daily.      . Cholecalciferol (VITAMIN  D3) 50 MCG (2000 UT) capsule Take 2,000 Units by mouth every evening.    . Cyanocobalamin (VITAMIN B-12 IJ) Inject 1 Dose as directed every 30 (thirty) days.     . diclofenac sodium (VOLTAREN) 1 % GEL Apply 2 g topically 4 (four) times daily. (Patient taking differently: Apply 2 g topically 4 (four) times daily as needed (hip pain). ) 1 Tube 5  . doxycycline (VIBRA-TABS) 100 MG tablet Take 1 tablet (100 mg total) by mouth 2 (two) times daily. 20 tablet 0  . DULoxetine (CYMBALTA) 30 MG capsule Take 30 mg by mouth every evening.    . furosemide (LASIX) 20 MG tablet Take 1 tablet (20 mg total) by mouth daily as needed for fluid or edema (or SOB or weight gain). 30 tablet 0  . Glucos-Chond-Hyal Ac-Ca Fructo (MOVE FREE JOINT HEALTH ADVANCE PO) Take 1 capsule by mouth 2 (two) times daily.    . hydrOXYzine (ATARAX) 25 MG tablet Take 25 mg by mouth at bedtime.     [name] levothyroxine (SYNTHROID, LEVOTHROID) 25 MCG tablet Take 25 mcg by mouth daily.      [name] loperamide (IMODIUM A-D) 2 MG tablet Take 2-4 mg by mouth as needed for diarrhea or loose stools.    . meclizine (ANTIVERT) 25 MG tablet Take 25 mg by mouth 2 (two) times daily as needed for dizziness or nausea.     [name]  metoprolol tartrate (LOPRESSOR) 50 MG tablet Take 1.5 tablets (75 mg total) by mouth 2 (two) times daily. 270 tablet 1  . metroNIDAZOLE (FLAGYL) 500 MG tablet Take 1 tablet (500 mg total) by mouth 2 (two) times daily. 10 tablet 0  . mupirocin ointment (BACTROBAN) 2 % APPLY TO AFFECTED AREA TWICE A DAY X 7  10 DAYS    . NEOMYCIN-POLYMYXIN-HYDROCORTISONE (CORTISPORIN) 1 % SOLN OTIC solution Apply 1-2 drops to toe BID after soaking 10 mL 1  . nitroGLYCERIN (NITROSTAT) 0.4 MG SL tablet Place 1 tablet (0.4 mg total) under the tongue every 5 (five) minutes as needed for chest pain. 1 tablet 0  . ondansetron (ZOFRAN) 4 MG tablet Take 1 tablet (4 mg total) by mouth every 8 (eight) hours as needed for nausea or vomiting. 12 tablet 0  . pantoprazole  (PROTONIX) 40 MG tablet Take 1 tablet (40 mg total) by mouth daily. NEED OV. 180 tablet 1  . Probiotic Product (PROBIOTIC-10 PO) Take by mouth daily.    [name] senna (SENOKOT) 8.6 MG TABS tablet Take 1 tablet by mouth daily as needed for mild constipation.     . SENNA PLUS 8.6-50 MG tablet Take 1 tablet by mouth 2 (two) times daily.    . traMADol (ULTRAM) 50 MG tablet Take 1-2 tablets (50-100 mg total) by mouth every 6 (six) hours as needed for moderate pain or severe pain. 20 tablet 0   No current facility-administered medications on file prior to visit.    No Known Allergies  Past Medical History:  Diagnosis Date  . Acromioclavicular joint arthritis   . Anemia   . B12 deficiency 10/15/11   "take injections q 28 days"  . Coronary artery disease    LHC 10/15/11: LAD stent patent with mid ectasia, distal LAD 50-60%, mid circumflex with ectasia, EF 55-65%.  . Dyslipidemia   . GERD (gastroesophageal reflux disease)   . Glucose intolerance (pre-diabetes)    Borderline glucose intolerance  . Hypertension   . Hypothyroidism   . Post concussive syndrome 2011   "for ~ 6-8 months"  . Rotator cuff tear   . Shortness of breath 10/15/11   "here lately I've been having it on & off any time"  . SVT (supraventricular tachycardia) ([place])    maintained on beta blocker    Past Surgical History:  Procedure Laterality Date  . BACK SURGERY    . CARDIAC CATHETERIZATION  12/19/08   NORMAL. EF 55%  . CARDIAC CATHETERIZATION  10/15/11  . CARDIOVASCULAR STRESS TEST  10/2010  . CARPAL TUNNEL RELEASE     bilaterally  . CHOLECYSTECTOMY  03/23/2019  . COLONOSCOPY W/ ENDOSCOPIC [place]    . CORONARY ANGIOPLASTY WITH STENT PLACEMENT  9/ 2005   . JOINT REPLACEMENT    . LAPAROSCOPIC CHOLECYSTECTOMY SINGLE SITE WITH INTRAOPERATIVE CHOLANGIOGRAM N/A 03/23/2019   Procedure: SINGLE SITE LAPAROSCOPIC CHOLECYSTECTOMY WITH INTRAOPERATIVE CHOLANGIOGRAM;  Surgeon: Michael Boston, MD;  Location: [facility];  Service: General;   Laterality: N/A;  . LEFT HEART CATH AND CORONARY ANGIOGRAPHY N/A 11/05/2018   Procedure: LEFT HEART CATH AND CORONARY ANGIOGRAPHY;  Surgeon: Martinique, Anysha Frappier M, MD;  Location: [place] [facility];  Service: Cardiovascular;  Laterality: N/A;  . LEFT HEART CATHETERIZATION WITH CORONARY ANGIOGRAM N/A 10/15/2011   Procedure: LEFT HEART CATHETERIZATION WITH CORONARY ANGIOGRAM;  Surgeon: Sherren Mocha, MD;  Location: [facility] CATH LAB;  Service: Cardiovascular;  Laterality: N/A;  . LUMBAR LAMINECTOMY  1970's  . NASAL SINUS SURGERY  x2  . reconstructive shoulder surgery     right  . REPLACEMENT TOTAL KNEE  12/2007   left  . RESECTION TUMOR CLAVICLE RADICAL     Open distal clavicle resection  . SHOULDER SURGERY  02/11/2011   right  . TRIGGER FINGER RELEASE     ? both hands    Social History   Tobacco Use  Smoking Status Former Smoker  . Packs/day: 1.00  . Years: 21.00  . Pack years: 21.00  . Types: Cigarettes  . Quit date: 04/07/1976  . Years since quitting: 43.7  Smokeless Tobacco Never Used    Social History   Substance and Sexual Activity  Alcohol Use Not Currently   Comment: 10/15/11 "haven't drank in 15-20 years"    Family History  Problem Relation Age of Onset  . Stroke Mother   . Heart attack Father   . Diabetes Father   . Kidney failure Father     Review of Systems: As noted in history of present illness.  All other systems were reviewed and are negative.  Physical Exam: BP 140/81   Pulse (!) 59   Temp (!) 97.3 F (36.3 C)   Resp (!) 21   Ht 6' (1.829 m)   Wt 199 lb 6.4 oz (90.4 kg)   SpO2 97%   BMI 27.04 kg/m  GENERAL:  Well appearing WM in NAD HEENT:  PERRL, EOMI, sclera are clear. Oropharynx is clear. NECK:  No jugular venous distention, carotid upstroke brisk and symmetric, no bruits, no thyromegaly or adenopathy LUNGS:  Clear to auscultation bilaterally CHEST:  Unremarkable HEART:  RRR,  PMI not displaced or sustained,S1 and S2 within normal limits, no  S3, no S4: no clicks, no rubs, no murmurs ABD:  Soft, nontender. BS +, no masses or bruits. No hepatomegaly, no splenomegaly EXT:  2 + pulses throughout, no edema, no cyanosis no clubbing. No hematoma at cath site. SKIN:  Warm and dry.  No rashes NEURO:  Alert and oriented x 3. Cranial nerves II through XII intact. PSYCH:  Cognitively intact      LABORATORY DATA:  Lab Results  Component Value Date   WBC 9.8 09/28/2019   HGB 14.7 09/28/2019   HCT 43.7 09/28/2019   PLT 262.0 09/28/2019   GLUCOSE 99 09/28/2019   CHOL 133 10/30/2018   TRIG 142 10/30/2018   HDL 39 (L) 10/30/2018   LDLCALC 66 10/30/2018   ALT 8 09/28/2019   AST 10 09/28/2019   NA 138 09/28/2019   K 3.9 09/28/2019   CL 105 09/28/2019   CREATININE 1.15 09/28/2019   BUN 17 09/28/2019   CO2 25 09/28/2019   TSH 3.504 03/26/2019   INR 1.1 03/25/2019   HGBA1C 5.0 10/29/2018    Reviewed from primary care dated 05/14/16: cholesterol 141, triglycerides 174, HDL39, LDL 67. BUN 23, creatinine 1.5. Other chemistries, Hgb, and TSH were normal. Dated 07/01/19: cholesterol 144, triglycerides 92, HDL 49, LDL 77. A1c 5.3%.    Cardiac cath 11/05/18: Procedures   LEFT HEART CATH AND CORONARY ANGIOGRAPHY  Conclusion     Previously placed Prox LAD stent (unknown type) is widely patent.  Mid LAD lesion is 45% stenosed.  LV end diastolic pressure is normal.   1. Nonobstructive CAD 2. Normal LVEDP  Plan: continue medical therapy. Will obtain an Echo as outpatient to assess LV function. Consider alternative causes of chest pain.     Assessment / Plan: 1. Coronary disease. S/p remote stenting of the proximal LAD  with Cypher stent in 2005.  Cardiac catheterization 11/05/18  showed nonobstructive disease.   2. Dyslipidemia LDL at goal. Continue statin  3. Hypertension. Well controlled.   4. SVT controlled on metoprolol  5. S/p lap choly in [place] 2020

## 2020-01-02 ENCOUNTER — Encounter: Payer: Self-pay | Admitting: Cardiology

## 2020-01-02 ENCOUNTER — Telehealth: Payer: Medicare Other | Admitting: Cardiology

## 2020-01-02 ENCOUNTER — Other Ambulatory Visit: Payer: Self-pay

## 2020-01-02 ENCOUNTER — Ambulatory Visit (INDEPENDENT_AMBULATORY_CARE_PROVIDER_SITE_OTHER): Payer: Medicare Other | Admitting: Cardiology

## 2020-01-02 VITALS — BP 140/81 | HR 59 | Temp 97.3°F | Resp 21 | Ht 72.0 in | Wt 199.4 lb

## 2020-01-02 DIAGNOSIS — E7800 Pure hypercholesterolemia, unspecified: Secondary | ICD-10-CM

## 2020-01-02 DIAGNOSIS — I471 Supraventricular tachycardia: Secondary | ICD-10-CM

## 2020-01-02 DIAGNOSIS — I10 Essential (primary) hypertension: Secondary | ICD-10-CM

## 2020-01-02 DIAGNOSIS — I25110 Atherosclerotic heart disease of native coronary artery with unstable angina pectoris: Secondary | ICD-10-CM

## 2020-01-02 MED ORDER — CLOPIDOGREL BISULFATE 75 MG PO TABS: 75.0000 mg | ORAL_TABLET | Freq: Every day | ORAL | 3 refills | Status: DC

## 2020-01-02 MED ORDER — LISINOPRIL 20 MG PO TABS: 20.0000 mg | ORAL_TABLET | Freq: Every day | ORAL | 3 refills | Status: DC

## 2020-01-02 MED ORDER — ATORVASTATIN CALCIUM 10 MG PO TABS: 10.0000 mg | ORAL_TABLET | Freq: Every day | ORAL | 3 refills | Status: DC

## 2020-01-04 DIAGNOSIS — Z1331 Encounter for screening for depression: Secondary | ICD-10-CM | POA: Diagnosis not present

## 2020-01-04 DIAGNOSIS — M1990 Unspecified osteoarthritis, unspecified site: Secondary | ICD-10-CM | POA: Diagnosis not present

## 2020-01-04 DIAGNOSIS — I2510 Atherosclerotic heart disease of native coronary artery without angina pectoris: Secondary | ICD-10-CM | POA: Diagnosis not present

## 2020-01-04 DIAGNOSIS — I10 Essential (primary) hypertension: Secondary | ICD-10-CM | POA: Diagnosis not present

## 2020-01-04 DIAGNOSIS — R7301 Impaired fasting glucose: Secondary | ICD-10-CM | POA: Diagnosis not present

## 2020-01-04 DIAGNOSIS — F329 Major depressive disorder, single episode, unspecified: Secondary | ICD-10-CM | POA: Diagnosis not present

## 2020-01-04 DIAGNOSIS — E663 Overweight: Secondary | ICD-10-CM | POA: Diagnosis not present

## 2020-01-10 DIAGNOSIS — H52203 Unspecified astigmatism, bilateral: Secondary | ICD-10-CM | POA: Diagnosis not present

## 2020-01-10 DIAGNOSIS — H5000 Unspecified esotropia: Secondary | ICD-10-CM | POA: Diagnosis not present

## 2020-01-10 DIAGNOSIS — H2513 Age-related nuclear cataract, bilateral: Secondary | ICD-10-CM | POA: Diagnosis not present

## 2020-01-12 DIAGNOSIS — J029 Acute pharyngitis, unspecified: Secondary | ICD-10-CM | POA: Diagnosis not present

## 2020-01-25 ENCOUNTER — Other Ambulatory Visit: Payer: Self-pay

## 2020-01-25 ENCOUNTER — Emergency Department (HOSPITAL_COMMUNITY): Payer: Medicare Other

## 2020-01-25 ENCOUNTER — Encounter (HOSPITAL_COMMUNITY): Payer: Self-pay | Admitting: Emergency Medicine

## 2020-01-25 ENCOUNTER — Inpatient Hospital Stay (HOSPITAL_COMMUNITY)
Admission: EM | Admit: 2020-01-25 | Discharge: 2020-01-27 | DRG: 309 | Disposition: A | Discharge status: Home / Self Care | Payer: Medicare Other | Attending: Student | Admitting: Student

## 2020-01-25 DIAGNOSIS — E860 Dehydration: Secondary | ICD-10-CM | POA: Diagnosis present

## 2020-01-25 DIAGNOSIS — E039 Hypothyroidism, unspecified: Secondary | ICD-10-CM | POA: Diagnosis present

## 2020-01-25 DIAGNOSIS — I471 Supraventricular tachycardia, unspecified: Secondary | ICD-10-CM | POA: Diagnosis present

## 2020-01-25 DIAGNOSIS — J9811 Atelectasis: Secondary | ICD-10-CM | POA: Diagnosis not present

## 2020-01-25 DIAGNOSIS — Z955 Presence of coronary angioplasty implant and graft: Secondary | ICD-10-CM | POA: Diagnosis not present

## 2020-01-25 DIAGNOSIS — Z03818 Encounter for observation for suspected exposure to other biological agents ruled out: Secondary | ICD-10-CM | POA: Diagnosis not present

## 2020-01-25 DIAGNOSIS — R7303 Prediabetes: Secondary | ICD-10-CM | POA: Diagnosis present

## 2020-01-25 DIAGNOSIS — I2510 Atherosclerotic heart disease of native coronary artery without angina pectoris: Secondary | ICD-10-CM | POA: Diagnosis present

## 2020-01-25 DIAGNOSIS — E785 Hyperlipidemia, unspecified: Secondary | ICD-10-CM | POA: Diagnosis present

## 2020-01-25 DIAGNOSIS — K219 Gastro-esophageal reflux disease without esophagitis: Secondary | ICD-10-CM | POA: Diagnosis present

## 2020-01-25 DIAGNOSIS — R5381 Other malaise: Secondary | ICD-10-CM | POA: Diagnosis present

## 2020-01-25 DIAGNOSIS — Z7982 Long term (current) use of aspirin: Secondary | ICD-10-CM

## 2020-01-25 DIAGNOSIS — N179 Acute kidney failure, unspecified: Secondary | ICD-10-CM | POA: Diagnosis present

## 2020-01-25 DIAGNOSIS — Z66 Do not resuscitate: Secondary | ICD-10-CM | POA: Diagnosis present

## 2020-01-25 DIAGNOSIS — I10 Essential (primary) hypertension: Secondary | ICD-10-CM | POA: Diagnosis present

## 2020-01-25 DIAGNOSIS — F329 Major depressive disorder, single episode, unspecified: Secondary | ICD-10-CM | POA: Diagnosis present

## 2020-01-25 DIAGNOSIS — E876 Hypokalemia: Secondary | ICD-10-CM | POA: Diagnosis present

## 2020-01-25 DIAGNOSIS — E872 Acidosis: Secondary | ICD-10-CM | POA: Diagnosis present

## 2020-01-25 DIAGNOSIS — I4891 Unspecified atrial fibrillation: Secondary | ICD-10-CM | POA: Diagnosis not present

## 2020-01-25 DIAGNOSIS — Z79891 Long term (current) use of opiate analgesic: Secondary | ICD-10-CM

## 2020-01-25 DIAGNOSIS — E538 Deficiency of other specified B group vitamins: Secondary | ICD-10-CM | POA: Diagnosis present

## 2020-01-25 DIAGNOSIS — Z79899 Other long term (current) drug therapy: Secondary | ICD-10-CM

## 2020-01-25 DIAGNOSIS — I25110 Atherosclerotic heart disease of native coronary artery with unstable angina pectoris: Secondary | ICD-10-CM | POA: Diagnosis not present

## 2020-01-25 DIAGNOSIS — D631 Anemia in chronic kidney disease: Secondary | ICD-10-CM | POA: Diagnosis present

## 2020-01-25 DIAGNOSIS — N1831 Chronic kidney disease, stage 3a: Secondary | ICD-10-CM | POA: Diagnosis present

## 2020-01-25 DIAGNOSIS — Z9049 Acquired absence of other specified parts of digestive tract: Secondary | ICD-10-CM | POA: Diagnosis not present

## 2020-01-25 DIAGNOSIS — Z86010 Personal history of colonic polyps: Secondary | ICD-10-CM | POA: Diagnosis not present

## 2020-01-25 DIAGNOSIS — I4892 Unspecified atrial flutter: Principal | ICD-10-CM | POA: Diagnosis present

## 2020-01-25 DIAGNOSIS — I130 Hypertensive heart and chronic kidney disease with heart failure and stage 1 through stage 4 chronic kidney disease, or unspecified chronic kidney disease: Secondary | ICD-10-CM | POA: Diagnosis present

## 2020-01-25 DIAGNOSIS — I5032 Chronic diastolic (congestive) heart failure: Secondary | ICD-10-CM | POA: Diagnosis present

## 2020-01-25 DIAGNOSIS — Z833 Family history of diabetes mellitus: Secondary | ICD-10-CM

## 2020-01-25 DIAGNOSIS — Z87891 Personal history of nicotine dependence: Secondary | ICD-10-CM

## 2020-01-25 DIAGNOSIS — R112 Nausea with vomiting, unspecified: Secondary | ICD-10-CM | POA: Diagnosis not present

## 2020-01-25 DIAGNOSIS — Z20822 Contact with and (suspected) exposure to covid-19: Secondary | ICD-10-CM | POA: Diagnosis present

## 2020-01-25 DIAGNOSIS — R1110 Vomiting, unspecified: Secondary | ICD-10-CM | POA: Diagnosis not present

## 2020-01-25 DIAGNOSIS — Z7902 Long term (current) use of antithrombotics/antiplatelets: Secondary | ICD-10-CM

## 2020-01-25 DIAGNOSIS — Z8249 Family history of ischemic heart disease and other diseases of the circulatory system: Secondary | ICD-10-CM

## 2020-01-25 DIAGNOSIS — N281 Cyst of kidney, acquired: Secondary | ICD-10-CM | POA: Diagnosis not present

## 2020-01-25 DIAGNOSIS — Z96652 Presence of left artificial knee joint: Secondary | ICD-10-CM | POA: Diagnosis present

## 2020-01-25 DIAGNOSIS — Z79890 Hormone replacement therapy (postmenopausal): Secondary | ICD-10-CM

## 2020-01-25 DIAGNOSIS — Z841 Family history of disorders of kidney and ureter: Secondary | ICD-10-CM

## 2020-01-25 LAB — COMPREHENSIVE METABOLIC PANEL
ALT: 14 U/L (ref 0–44)
AST: 20 U/L (ref 15–41)
Albumin: 4.2 g/dL (ref 3.5–5.0)
Alkaline Phosphatase: 59 U/L (ref 38–126)
Anion gap: 13 (ref 5–15)
BUN: 14 mg/dL (ref 8–23)
CO2: 22 mmol/L (ref 22–32)
Calcium: 9.5 mg/dL (ref 8.9–10.3)
Chloride: 103 mmol/L (ref 98–111)
Creatinine, Ser: 1.41 mg/dL — ABNORMAL HIGH (ref 0.61–1.24)
GFR calc Af Amer: 54 mL/min — ABNORMAL LOW (ref >60)
GFR calc non Af Amer: 47 mL/min — ABNORMAL LOW (ref >60)
Glucose, Bld: 137 mg/dL — ABNORMAL HIGH (ref 70–99)
Potassium: 3.5 mmol/L (ref 3.5–5.1)
Sodium: 138 mmol/L (ref 135–145)
Total Bilirubin: 1.1 mg/dL (ref 0.3–1.2)
Total Protein: 7.4 g/dL (ref 6.5–8.1)

## 2020-01-25 LAB — CBC
HCT: 47 % (ref 39.0–52.0)
HCT: 43.6 % (ref 39.0–52.0)
Hemoglobin: 15.2 g/dL (ref 13.0–17.0)
Hemoglobin: 13.9 g/dL (ref 13.0–17.0)
MCH: 29.5 pg (ref 26.0–34.0)
MCH: 29.5 pg (ref 26.0–34.0)
MCHC: 32.3 g/dL (ref 30.0–36.0)
MCHC: 31.9 g/dL (ref 30.0–36.0)
MCV: 91.1 fL (ref 80.0–100.0)
MCV: 92.6 fL (ref 80.0–100.0)
Platelets: 306 10*3/uL (ref 150–400)
Platelets: 244 10*3/uL (ref 150–400)
RBC: 5.16 MIL/uL (ref 4.22–5.81)
RBC: 4.71 MIL/uL (ref 4.22–5.81)
RDW: 13.5 % (ref 11.5–15.5)
RDW: 13.4 % (ref 11.5–15.5)
WBC: 11.1 10*3/uL — ABNORMAL HIGH (ref 4.0–10.5)
WBC: 10.4 10*3/uL (ref 4.0–10.5)
nRBC: 0 % (ref 0.0–0.2)
nRBC: 0 % (ref 0.0–0.2)

## 2020-01-25 LAB — URINALYSIS, ROUTINE W REFLEX MICROSCOPIC
Bacteria, UA: NONE SEEN
Bilirubin Urine: NEGATIVE
Glucose, UA: NEGATIVE mg/dL
Hgb urine dipstick: NEGATIVE
Ketones, ur: 5 mg/dL — AB
Leukocytes,Ua: NEGATIVE
Nitrite: NEGATIVE
Protein, ur: 30 mg/dL — AB
Specific Gravity, Urine: 1.02 (ref 1.005–1.030)
pH: 5 (ref 5.0–8.0)

## 2020-01-25 LAB — CBC WITH DIFFERENTIAL/PLATELET
Abs Immature Granulocytes: 0.05 10*3/uL (ref 0.00–0.07)
Basophils Absolute: 0.1 10*3/uL (ref 0.0–0.1)
Basophils Relative: 0 %
Eosinophils Absolute: 0.1 10*3/uL (ref 0.0–0.5)
Eosinophils Relative: 1 %
HCT: 49.7 % (ref 39.0–52.0)
Hemoglobin: 15.8 g/dL (ref 13.0–17.0)
Immature Granulocytes: 0 %
Lymphocytes Relative: 14 %
Lymphs Abs: 1.8 10*3/uL (ref 0.7–4.0)
MCH: 29.3 pg (ref 26.0–34.0)
MCHC: 31.8 g/dL (ref 30.0–36.0)
MCV: 92.2 fL (ref 80.0–100.0)
Monocytes Absolute: 1.2 10*3/uL — ABNORMAL HIGH (ref 0.1–1.0)
Monocytes Relative: 10 %
Neutro Abs: 9.5 10*3/uL — ABNORMAL HIGH (ref 1.7–7.7)
Neutrophils Relative %: 75 %
Platelets: 298 10*3/uL (ref 150–400)
RBC: 5.39 MIL/uL (ref 4.22–5.81)
RDW: 13.5 % (ref 11.5–15.5)
WBC: 12.7 10*3/uL — ABNORMAL HIGH (ref 4.0–10.5)
nRBC: 0 % (ref 0.0–0.2)

## 2020-01-25 LAB — CREATININE, SERUM
Creatinine, Ser: 1.24 mg/dL (ref 0.61–1.24)
GFR calc Af Amer: >60 mL/min (ref >60)
GFR calc non Af Amer: 55 mL/min — ABNORMAL LOW (ref >60)

## 2020-01-25 LAB — LACTIC ACID, PLASMA: Lactic Acid, Venous: 2.4 mmol/L (ref 0.5–1.9)

## 2020-01-25 LAB — LIPASE, BLOOD: Lipase: 19 U/L (ref 11–51)

## 2020-01-25 LAB — I-STAT CHEM 8, ED
BUN: 16 mg/dL (ref 8–23)
Calcium, Ion: 1.11 mmol/L — ABNORMAL LOW (ref 1.15–1.40)
Chloride: 105 mmol/L (ref 98–111)
Creatinine, Ser: 1.5 mg/dL — ABNORMAL HIGH (ref 0.61–1.24)
Glucose, Bld: 147 mg/dL — ABNORMAL HIGH (ref 70–99)
HCT: 48 % (ref 39.0–52.0)
Hemoglobin: 16.3 g/dL (ref 13.0–17.0)
Potassium: 3.7 mmol/L (ref 3.5–5.1)
Sodium: 139 mmol/L (ref 135–145)
TCO2: 24 mmol/L (ref 22–32)

## 2020-01-25 LAB — SARS CORONAVIRUS 2 (TAT 6-24 HRS): SARS Coronavirus 2: NEGATIVE

## 2020-01-25 LAB — TSH: TSH: 3.602 u[IU]/mL (ref 0.350–4.500)

## 2020-01-25 LAB — PHOSPHORUS: Phosphorus: 3.4 mg/dL (ref 2.5–4.6)

## 2020-01-25 LAB — MAGNESIUM: Magnesium: 1.9 mg/dL (ref 1.7–2.4)

## 2020-01-25 MED ORDER — ONDANSETRON HCL 4 MG/2ML IJ SOLN: 4.0000 mg | Freq: Four times a day (QID) | INTRAMUSCULAR | Status: DC | PRN

## 2020-01-25 MED ORDER — ACETAMINOPHEN 325 MG PO TABS
650.0000 mg | ORAL_TABLET | Freq: Four times a day (QID) | ORAL | Status: DC | PRN
  Administered 2020-01-26: 650 mg via ORAL
  Filled 2020-01-25: qty 2

## 2020-01-25 MED ORDER — APIXABAN 5 MG PO TABS
5.0000 mg | ORAL_TABLET | Freq: Two times a day (BID) | ORAL | Status: DC
  Administered 2020-01-25 – 2020-01-27 (×4): 5 mg via ORAL
  Filled 2020-01-25 (×4): qty 1

## 2020-01-25 MED ORDER — DILTIAZEM LOAD VIA INFUSION
15.0000 mg | Freq: Once | INTRAVENOUS | Status: AC
  Administered 2020-01-25: 15 mg via INTRAVENOUS
  Filled 2020-01-25: qty 15

## 2020-01-25 MED ORDER — LEVOTHYROXINE SODIUM 25 MCG PO TABS
25.0000 ug | ORAL_TABLET | Freq: Every day | ORAL | Status: DC
  Administered 2020-01-26 – 2020-01-27 (×2): 25 ug via ORAL
  Filled 2020-01-25 (×4): qty 1

## 2020-01-25 MED ORDER — ONDANSETRON HCL 4 MG PO TABS: 4.0000 mg | ORAL_TABLET | Freq: Four times a day (QID) | ORAL | Status: DC | PRN

## 2020-01-25 MED ORDER — DULOXETINE HCL 30 MG PO CPEP
30.0000 mg | ORAL_CAPSULE | Freq: Every evening | ORAL | Status: DC
  Administered 2020-01-25 – 2020-01-26 (×2): 30 mg via ORAL
  Filled 2020-01-25 (×2): qty 1

## 2020-01-25 MED ORDER — ASPIRIN EC 81 MG PO TBEC
81.0000 mg | DELAYED_RELEASE_TABLET | Freq: Every day | ORAL | Status: DC
  Administered 2020-01-25: 81 mg via ORAL
  Filled 2020-01-25: qty 1

## 2020-01-25 MED ORDER — SODIUM CHLORIDE 0.9 % IV SOLN: INTRAVENOUS | Status: DC

## 2020-01-25 MED ORDER — ONDANSETRON HCL 4 MG/2ML IJ SOLN
4.0000 mg | Freq: Once | INTRAMUSCULAR | Status: AC
  Administered 2020-01-25: 4 mg via INTRAVENOUS
  Filled 2020-01-25: qty 2

## 2020-01-25 MED ORDER — SODIUM CHLORIDE 0.9 % IV BOLUS
1000.0000 mL | Freq: Once | INTRAVENOUS | Status: AC
  Administered 2020-01-25: 1000 mL via INTRAVENOUS

## 2020-01-25 MED ORDER — HYDROXYZINE HCL 25 MG PO TABS
25.0000 mg | ORAL_TABLET | Freq: Every day | ORAL | Status: DC
  Administered 2020-01-25 – 2020-01-26 (×2): 25 mg via ORAL
  Filled 2020-01-25 (×3): qty 1

## 2020-01-25 MED ORDER — DILTIAZEM HCL-DEXTROSE 125-5 MG/125ML-% IV SOLN (PREMIX)
5.0000 mg/h | INTRAVENOUS | Status: DC
  Administered 2020-01-25: 5 mg/h via INTRAVENOUS
  Filled 2020-01-25: qty 125

## 2020-01-25 MED ORDER — ENOXAPARIN SODIUM 40 MG/0.4ML ~~LOC~~ SOLN
40.0000 mg | SUBCUTANEOUS | Status: DC
  Administered 2020-01-25: 40 mg via SUBCUTANEOUS
  Filled 2020-01-25: qty 0.4

## 2020-01-25 MED ORDER — ATORVASTATIN CALCIUM 10 MG PO TABS
10.0000 mg | ORAL_TABLET | Freq: Every day | ORAL | Status: DC
  Administered 2020-01-25 – 2020-01-27 (×3): 10 mg via ORAL
  Filled 2020-01-25 (×3): qty 1

## 2020-01-25 MED ORDER — ACETAMINOPHEN 650 MG RE SUPP: 650.0000 mg | Freq: Four times a day (QID) | RECTAL | Status: DC | PRN

## 2020-01-25 MED ORDER — CLOPIDOGREL BISULFATE 75 MG PO TABS
75.0000 mg | ORAL_TABLET | Freq: Every day | ORAL | Status: DC
  Administered 2020-01-25: 75 mg via ORAL
  Filled 2020-01-25: qty 1

## 2020-01-25 MED ORDER — PANTOPRAZOLE SODIUM 40 MG PO TBEC
40.0000 mg | DELAYED_RELEASE_TABLET | Freq: Every day | ORAL | Status: DC
  Administered 2020-01-26 – 2020-01-27 (×2): 40 mg via ORAL
  Filled 2020-01-25 (×2): qty 1

## 2020-01-25 NOTE — Progress Notes (Signed)
ANTICOAGULATION CONSULT NOTE - Initial Consult  Pharmacy Consult for Apixaban Indication: Nonvalvular Atrial Fibrillation  No Known Allergies  Patient Measurements: Height: 6' (182.9 cm) Weight: 190 lb (86.2 kg) IBW/kg (Calculated) : 77.6   Vital Signs: Temp: 98.3 F (36.8 C) (03/31 1131) Temp Source: Oral (03/31 1131) BP: 128/81 (03/31 2000) Pulse Rate: 90 (03/31 2000)  Labs: Recent Labs    01/25/20 1133 01/25/20 1133 01/25/20 1206 01/25/20 1206 01/25/20 1214 01/25/20 1725  HGB 15.2   < > 15.8   < > 16.3 13.9  HCT 47.0   < > 49.7  --  48.0 43.6  PLT 306  --  298  --   --  244  CREATININE 1.41*  --   --   --  1.50* 1.24   < > = values in this interval not displayed.    Estimated Creatinine Clearance: 52.2 mL/min (by C-G formula based on SCr of 1.24 mg/dL).   Medical History: Past Medical History:  Diagnosis Date  . Acromioclavicular joint arthritis   . Anemia   . B12 deficiency 10/15/11   "take injections q 28 days"  . Coronary artery disease    LHC 10/15/11: LAD stent patent with mid ectasia, distal LAD 50-60%, mid circumflex with ectasia, EF 55-65%.  . Dyslipidemia   . GERD (gastroesophageal reflux disease)   . Glucose intolerance (pre-diabetes)    Borderline glucose intolerance  . Hypertension   . Hypothyroidism   . Post concussive syndrome 2011   "for ~ 6-8 months"  . Rotator cuff tear   . Shortness of breath 10/15/11   "here lately I've been having it on & off any time"  . SVT (supraventricular tachycardia) ([facility])    maintained on beta blocker    Assessment: 79 yr old male with hx of PSVT and CAD (prior PCI), now with atrial flutter with RVR. Pharmacy is consulted to dose apixaban. He was on no anticoagulant PTA.   CBC WNL; Scr 1.24; 79 yrs old; 86.2 kg  Pt rec'd Lovenox 40 mg SQ at 18:33 PM today.  Goal of Therapy:  Prevention of stroke secondary to atrial fibrillation Monitor platelets by anticoagulation protocol: Yes   Plan:  Apixaban  5 mg PO BID starting tonight Monitor daily CBC Monitor for signs/symptoms of bleeding  Gillermina Hu, PharmD, BCPS, [facility] Clinical Pharmacist 01/25/2020,8:48 PM

## 2020-01-25 NOTE — Consult Note (Addendum)
Cardiology Consultation:   Patient ID: Johnny [name] MRN: [id number]; DOB: December 25, 1940  Admit date: 01/25/2020 Date of Consult: 01/25/2020  Primary Care Provider: Burnard Bunting, MD Primary Cardiologist: Peter Martinique, MD  Primary Electrophysiologist:  None    Patient Profile:   Johnny [name] is a 79 y.o. male with a hx of PSVT and CAD with prior PCI who is being seen today for the evaluation of tachycardia at the request of Dr Doristine Bosworth.  History of Present Illness:   Johnny [name] 78 yo male history of SVT, CAD with prior stent to prox LAD in 2005 on chronic DAPT with last cath nonobstructive CAD Jan 2020.  Jan 2020 echo LVEF 50-55%  Presented with abdominal pain, n/v since eating some bbq on Monday. While in ER found to narrow complex regular tach 160. In ER given 15mg  of IV dilt, then started on dilt gtt. He denies any palpitations at any time.   WBC 11.1 Hgb 15.2 plt 306 K 3.5 Cr 1.41 M g1.9  CXR no acute process CT A/P no acute process COVID pending EKG narrow complex regular tach 160 looks like aflutter   I Past Medical History:  Diagnosis Date  . Acromioclavicular joint arthritis   . Anemia   . B12 deficiency 10/15/11   "take injections q 28 days"  . Coronary artery disease    LHC 10/15/11: LAD stent patent with mid ectasia, distal LAD 50-60%, mid circumflex with ectasia, EF 55-65%.  . Dyslipidemia   . GERD (gastroesophageal reflux disease)   . Glucose intolerance (pre-diabetes)    Borderline glucose intolerance  . Hypertension   . Hypothyroidism   . Post concussive syndrome 2011   "for ~ 6-8 months"  . Rotator cuff tear   . Shortness of breath 10/15/11   "here lately I've been having it on & off any time"  . SVT (supraventricular tachycardia) ([place])    maintained on beta blocker    Past Surgical History:  Procedure Laterality Date  . BACK SURGERY    . CARDIAC CATHETERIZATION  12/19/08   NORMAL. EF 55%  . CARDIAC CATHETERIZATION  10/15/11  . CARDIOVASCULAR STRESS  TEST  10/2010  . CARPAL TUNNEL RELEASE     bilaterally  . CHOLECYSTECTOMY  03/23/2019  . COLONOSCOPY W/ ENDOSCOPIC [place]    . CORONARY ANGIOPLASTY WITH STENT PLACEMENT  9/ 2005   . JOINT REPLACEMENT    . LAPAROSCOPIC CHOLECYSTECTOMY SINGLE SITE WITH INTRAOPERATIVE CHOLANGIOGRAM N/A 03/23/2019   Procedure: SINGLE SITE LAPAROSCOPIC CHOLECYSTECTOMY WITH INTRAOPERATIVE CHOLANGIOGRAM;  Surgeon: Michael Boston, MD;  Location: [facility];  Service: General;  Laterality: N/A;  . LEFT HEART CATH AND CORONARY ANGIOGRAPHY N/A 11/05/2018   Procedure: LEFT HEART CATH AND CORONARY ANGIOGRAPHY;  Surgeon: Martinique, Peter M, MD;  Location: [facility];  Service: Cardiovascular;  Laterality: N/A;  . LEFT HEART CATHETERIZATION WITH CORONARY ANGIOGRAM N/A 10/15/2011   Procedure: LEFT HEART CATHETERIZATION WITH CORONARY ANGIOGRAM;  Surgeon: Sherren Mocha, MD;  Location: [facility] CATH LAB;  Service: Cardiovascular;  Laterality: N/A;  . LUMBAR LAMINECTOMY  1970's  . NASAL SINUS SURGERY     x2  . reconstructive shoulder surgery     right  . REPLACEMENT TOTAL KNEE  12/2007   left  . RESECTION TUMOR CLAVICLE RADICAL     Open distal clavicle resection  . SHOULDER SURGERY  02/11/2011   right  . TRIGGER FINGER RELEASE     ? both hands     Inpatient Medications: Scheduled Meds: .  aspirin EC  81 mg Oral Daily  . atorvastatin  10 mg Oral Daily  . clopidogrel  75 mg Oral Daily  . DULoxetine  30 mg Oral QPM  . enoxaparin (LOVENOX) injection  40 mg Subcutaneous Q24H  . hydrOXYzine  25 mg Oral QHS  . [START ON 01/26/2020] levothyroxine  25 mcg Oral QAC breakfast  . [START ON 01/26/2020] pantoprazole  40 mg Oral QAC breakfast   Continuous Infusions: . sodium chloride 100 mL/hr at 01/25/20 1827  . diltiazem (CARDIZEM) infusion 5 mg/hr (01/25/20 1822)   PRN Meds: acetaminophen **OR** acetaminophen, ondansetron **OR** ondansetron (ZOFRAN) IV  Allergies:   No Known Allergies  Social History:   Social History   Socioeconomic  History  . Marital status: Widowed    Spouse name: Not on file  . Number of children: 2  . Years of education: Not on file  . Highest education level: Not on file  Occupational History    Employer: RETIRED  Tobacco Use  . Smoking status: Former Smoker    Packs/day: 1.00    Years: 21.00    Pack years: 21.00    Types: Cigarettes    Quit date: 04/07/1976    Years since quitting: 43.8  . Smokeless tobacco: Never Used  Substance and Sexual Activity  . Alcohol use: Not Currently    Comment: 10/15/11 "haven't drank in 15-20 years"  . Drug use: No  . Sexual activity: Not Currently  Other Topics Concern  . Not on file  Social History Narrative  . Not on file   Social Determinants of Health   Financial Resource Strain:   . Difficulty of Paying Living Expenses:   Food Insecurity:   . Worried About Charity fundraiser in the Last Year:   . Arboriculturist in the Last Year:   Transportation Needs:   . Film/video editor (Medical):   [name] Lack of Transportation (Non-Medical):   Physical Activity:   . Days of Exercise per Week:   . Minutes of Exercise per Session:   Stress:   . Feeling of Stress :   Social Connections:   . Frequency of Communication with Friends and Family:   . Frequency of Social Gatherings with Friends and Family:   . Attends Religious Services:   . Active Member of Clubs or Organizations:   . Attends Archivist Meetings:   [name] Marital Status:   Intimate Partner Violence:   . Fear of Current or Ex-Partner:   . Emotionally Abused:   [name] Physically Abused:   . Sexually Abused:     Family History:    Family History  Problem Relation Age of Onset  . Stroke Mother   . Heart attack Father   . Diabetes Father   . Kidney failure Father      ROS:  Please see the history of present illness.  All other ROS reviewed and negative.     Physical Exam/Data:   Vitals:   01/25/20 1700 01/25/20 1800 01/25/20 1835 01/25/20 1900  BP: 118/89 (!) 136/92  99/87 112/71  Pulse: (!) 120 (!) 125 (!) 114 (!) 103  Resp: 19 (!) 36 18 (!) 23  Temp:      TempSrc:      SpO2: 94% 93% 92%   Weight:      Height:        Intake/Output Summary (Last 24 hours) at 01/25/2020 2004 Last data filed at 01/25/2020 1609 Gross per 24 hour  Intake  2005.92 ml  Output --  Net 2005.92 ml   Last 3 Weights 01/25/2020 01/02/2020 09/28/2019  Weight (lbs) 190 lb 199 lb 6.4 oz 200 lb  Weight (kg) 86.183 kg 90.447 kg 90.719 kg     Body mass index is 25.77 kg/m.  General:  Well nourished, well developed, in no acute distress HEENT: normal Lymph: no adenopathy Neck: no JVD Endocrine:  No thryomegaly Vascular: No carotid bruits; FA pulses 2+ bilaterally without bruits  Cardiac:  Irreg, tachy Lungs:  clear to auscultation bilaterally, no wheezing, rhonchi or rales  Abd: soft, nontender, no hepatomegaly  Ext: no edema Musculoskeletal:  No deformities, BUE and BLE strength normal and equal Skin: warm and dry  Neuro:  CNs 2-12 intact, no focal abnormalities noted Psych:  Normal affect     Laboratory Data:  High Sensitivity Troponin:  No results for input(s): TROPONINIHS in the last 720 hours.   Chemistry Recent Labs  Lab 01/25/20 1133 01/25/20 1214 01/25/20 1725  NA 138 139  --   K 3.5 3.7  --   CL 103 105  --   CO2 22  --   --   GLUCOSE 137* 147*  --   BUN 14 16  --   CREATININE 1.41* 1.50* 1.24  CALCIUM 9.5  --   --   GFRNONAA 47*  --  55*  GFRAA 54*  --  >60  ANIONGAP 13  --   --     Recent Labs  Lab 01/25/20 1133  PROT 7.4  ALBUMIN 4.2  AST 20  ALT 14  ALKPHOS 59  BILITOT 1.1   Hematology Recent Labs  Lab 01/25/20 1133 01/25/20 1133 01/25/20 1206 01/25/20 1214 01/25/20 1725  WBC 11.1*  --  12.7*  --  10.4  RBC 5.16  --  5.39  --  4.71  HGB 15.2   < > 15.8 16.3 13.9  HCT 47.0   < > 49.7 48.0 43.6  MCV 91.1  --  92.2  --  92.6  MCH 29.5  --  29.3  --  29.5  MCHC 32.3  --  31.8  --  31.9  RDW 13.5  --  13.5  --  13.4  PLT 306   --  298  --  244   < > = values in this interval not displayed.   BNPNo results for input(s): BNP, PROBNP in the last 168 hours.  DDimer No results for input(s): DDIMER in the last 168 hours.   Radiology/Studies:  CT ABDOMEN PELVIS WO CONTRAST  Result Date: 01/25/2020 CLINICAL DATA:  Nausea and vomiting. EXAM: CT ABDOMEN AND PELVIS WITHOUT CONTRAST TECHNIQUE: Multidetector CT imaging of the abdomen and pelvis was performed following the standard protocol without IV contrast. COMPARISON:  CT scan dated 10/05/2019 FINDINGS: Lower chest: No acute abnormalities. Hepatobiliary: No focal liver abnormality is seen. Status post cholecystectomy. No biliary dilatation. Pancreas: There are few chronic calcifications in the uncinate process of the pancreas, stable. The pancreas is otherwise normal. Spleen: Normal in size without focal abnormality. Adrenals/Urinary Tract: Adrenal glands are normal. Kidneys are normal except for a benign-appearing 5.3 cm cyst on the lower pole of the right kidney. Left kidney is normal. No hydronephrosis. Bladder is normal. Stomach/Bowel: Stomach is within normal limits. Appendix appears normal. No evidence of bowel wall thickening, distention, or inflammatory changes. The cecum lies in the right mid abdomen as does the appendix. Vascular/Lymphatic: Aortic atherosclerosis. No enlarged abdominal or pelvic lymph nodes. Reproductive: Prostate is  unremarkable. Other: No abdominal wall hernia or abnormality. No abdominopelvic ascites. Musculoskeletal: No acute abnormality. Severe degenerative disc disease in the lower lumbar spine. IMPRESSION: Benign-appearing abdomen and pelvis. Aortic Atherosclerosis (ICD10-I70.0). Electronically Signed   By: Lorriane Shire M.D.   On: 01/25/2020 15:34   DG Chest Portable 1 View  Result Date: 01/25/2020 CLINICAL DATA:  Acute supraventricular tachycardia EXAM: PORTABLE CHEST 1 VIEW COMPARISON:  Portable exam 1213 hours compared to 03/25/2019 FINDINGS:  Normal heart size, mediastinal contours, and pulmonary vascularity. Atherosclerotic calcification aorta. Mild elevation of RIGHT diaphragm with RIGHT basilar atelectasis. Lungs otherwise clear. No acute infiltrate, pleural effusion or pneumothorax. Osseous structures unremarkable. IMPRESSION: Mild RIGHT basilar atelectasis. Electronically Signed   By: Lavonia Dana M.D.   On: 01/25/2020 12:22   {   Assessment and Plan:   1. Aflutter with RVR - new diagnosis, prior SVT but first documented aflutter - on dilt gtt for rate control, continue overnight particularly with his N/V - CHADS2Vasc score is at least 74 (age x2, CAD). Would start eliquis 5mg  bid, d/c his DAPT - N/V would need to resolve prior to considering TEE/DCCV due to aspiration risk   2. CAD - had been on long term DAPT per Dr Martinique, history of prior prox LAD stent. Last cath Jan 2020 nonobstructive disease. - no acute issues - since starting eliquis, would d/c his long term DAPT  3. N/V - started after eating bbq on Monday - workup per primary team    For questions or updates, please contact [facility] Please consult [email or web address] for contact info under     Signed, Carlyle Dolly, MD  01/25/2020 8:04 PM

## 2020-01-25 NOTE — ED Notes (Signed)
Requested meds from pharmacy  

## 2020-01-25 NOTE — ED Triage Notes (Signed)
Pt here  From home with c/o feeling dehydrated , pt state that he has not being  Urinating much over the last 24 hrs

## 2020-01-25 NOTE — ED Notes (Signed)
Pt's health care worker states that pt has been vomiting since eating out this past Monday , pt hs been taking phenergan without relief

## 2020-01-25 NOTE — ED Notes (Signed)
Pt asked to provide a urine sample. Pt stated, "I will give you one, when I can".

## 2020-01-25 NOTE — H&P (Signed)
History and Physical    Xyaire Goldner Hannibal [id number] DOB: 1941-02-21 DOA: 01/25/2020  PCP: Burnard Bunting, MD  Patient coming from: home I have personally briefly reviewed patient's old medical records in [place]  Chief Complaint: Vomiting and dehydration  HPI: Rainey Iwanski Downs is a 79 y.o. male with medical history significant of hypertension, hypothyroidism, coronary artery disease, GERD presents to emergency department due to vomiting and dehydration.  Patient tells me that he ate barbecue on Monday and since then he has been vomiting.  No abdominal pain or diarrhea reported.  Reports that he vomited numerous times.  No hematemesis or melena.  Denies previous history of A. fib, headache, blurry vision, chest pain, shortness of breath, palpitation, leg swelling, fever, chills, cough, congestion, decreased appetite, weakness or urinary symptoms.    He reports being dizzy however denies head trauma, syncope, loss of consciousness or seizures.  He was evaluated by his home health nurse who brought patient to the ER for the further evaluation of vomiting and dehydration.  Patient lives alone, no history of smoking, alcohol, illicit drug use.  ED Course: Upon arrival to ED: Patient appears to have acute SVT but then it changed into A. fib with heart rate in 120s. CT abdomen came back negative for acute findings.  Lipase: WNL.  BMP shows worsening kidney function.  He is afebrile with leukocytosis of 12.7, chest x-ray shows mild right basilar atelectasis.  COVID-19 pending.  Patient received IV fluid bolus, Zofran and diltiazem.  [facility] hospitalist consulted for admission for A. fib with RVR likely secondary to dehydration.    Review of Systems: As per HPI otherwise negative.    Past Medical History:  Diagnosis Date  . Acromioclavicular joint arthritis   . Anemia   . B12 deficiency 10/15/11   "take injections q 28 days"  . Coronary artery disease    LHC 10/15/11: LAD stent patent with  mid ectasia, distal LAD 50-60%, mid circumflex with ectasia, EF 55-65%.  . Dyslipidemia   . GERD (gastroesophageal reflux disease)   . Glucose intolerance (pre-diabetes)    Borderline glucose intolerance  . Hypertension   . Hypothyroidism   . Post concussive syndrome 2011   "for ~ 6-8 months"  . Rotator cuff tear   . Shortness of breath 10/15/11   "here lately I've been having it on & off any time"  . SVT (supraventricular tachycardia) ([place])    maintained on beta blocker    Past Surgical History:  Procedure Laterality Date  . BACK SURGERY    . CARDIAC CATHETERIZATION  12/19/08   NORMAL. EF 55%  . CARDIAC CATHETERIZATION  10/15/11  . CARDIOVASCULAR STRESS TEST  10/2010  . CARPAL TUNNEL RELEASE     bilaterally  . CHOLECYSTECTOMY  03/23/2019  . COLONOSCOPY W/ ENDOSCOPIC [place]    . CORONARY ANGIOPLASTY WITH STENT PLACEMENT  9/ 2005   . JOINT REPLACEMENT    . LAPAROSCOPIC CHOLECYSTECTOMY SINGLE SITE WITH INTRAOPERATIVE CHOLANGIOGRAM N/A 03/23/2019   Procedure: SINGLE SITE LAPAROSCOPIC CHOLECYSTECTOMY WITH INTRAOPERATIVE CHOLANGIOGRAM;  Surgeon: Michael Boston, MD;  Location: [facility];  Service: General;  Laterality: N/A;  . LEFT HEART CATH AND CORONARY ANGIOGRAPHY N/A 11/05/2018   Procedure: LEFT HEART CATH AND CORONARY ANGIOGRAPHY;  Surgeon: Martinique, Peter M, MD;  Location: [place] [facility];  Service: Cardiovascular;  Laterality: N/A;  . LEFT HEART CATHETERIZATION WITH CORONARY ANGIOGRAM N/A 10/15/2011   Procedure: LEFT HEART CATHETERIZATION WITH CORONARY ANGIOGRAM;  Surgeon: Sherren Mocha, MD;  Location:  [facility];  Service: Cardiovascular;  Laterality: N/A;  . LUMBAR LAMINECTOMY  1970's  . NASAL SINUS SURGERY     x2  . reconstructive shoulder surgery     right  . REPLACEMENT TOTAL KNEE  12/2007   left  . RESECTION TUMOR CLAVICLE RADICAL     Open distal clavicle resection  . SHOULDER SURGERY  02/11/2011   right  . TRIGGER FINGER RELEASE     ? both hands     reports that he quit  smoking about 43 years ago. His smoking use included cigarettes. He has a 21.00 pack-year smoking history. He has never used smokeless tobacco. He reports previous alcohol use. He reports that he does not use drugs.  No Known Allergies  Family History  Problem Relation Age of Onset  . Stroke Mother   . Heart attack Father   . Diabetes Father   . Kidney failure Father     Prior to Admission medications   Medication Sig Start Date End Date Taking? Authorizing Provider  acetaminophen (TYLENOL) 650 MG CR tablet Take 1,300 mg by mouth 2 (two) times daily.    Yes [provider]  ANORO ELLIPTA 62.5-25 MCG/INH AEPB Inhale 1 puff into the lungs daily as needed (shortness of breath).  01/06/19  Yes [provider]  aspirin 81 MG tablet Take 81 mg by mouth daily.     Yes [provider]  atorvastatin (LIPITOR) 10 MG tablet Take 1 tablet (10 mg total) by mouth daily. 01/02/20  Yes Martinique, Peter M, MD  Cholecalciferol (VITAMIN D3) 50 MCG (2000 UT) capsule Take 2,000 Units by mouth every evening.   Yes [provider]  clopidogrel (PLAVIX) 75 MG tablet Take 1 tablet (75 mg total) by mouth daily. 01/02/20  Yes Martinique, Peter M, MD  Cyanocobalamin (VITAMIN B-12 IJ) Inject 1,000 mcg as directed every 30 (thirty) days.    Yes [provider]  diclofenac sodium (VOLTAREN) 1 % GEL Apply 2 g topically 4 (four) times daily. Patient taking differently: Apply 2 g topically 4 (four) times daily as needed (hip pain).  08/13/18  Yes Aundra Dubin, PA-C  DULoxetine (CYMBALTA) 30 MG capsule Take 30 mg by mouth every evening.   Yes [provider]  fluticasone (FLONASE) 50 MCG/ACT nasal spray Place 1 spray into both nostrils 2 (two) times daily as needed for allergies or rhinitis.  01/12/20  Yes [provider]  Glucos-Chond-Hyal Ac-Ca Fructo (MOVE FREE JOINT HEALTH ADVANCE PO) Take 1 capsule by mouth 2 (two) times daily.   Yes [provider]   hydrOXYzine (ATARAX) 25 MG tablet Take 25 mg by mouth at bedtime.    Yes [provider]  levothyroxine (SYNTHROID, LEVOTHROID) 25 MCG tablet Take 25 mcg by mouth daily before breakfast.    Yes [provider]  lisinopril (ZESTRIL) 20 MG tablet Take 1 tablet (20 mg total) by mouth daily. 01/02/20 12/27/20 Yes Martinique, Peter M, MD  loperamide (IMODIUM A-D) 2 MG tablet Take 2-4 mg by mouth 4 (four) times daily as needed for diarrhea or loose stools.    Yes [provider]  meclizine (ANTIVERT) 25 MG tablet Take 25 mg by mouth 2 (two) times daily as needed for dizziness or nausea.    Yes [provider]  metoprolol tartrate (LOPRESSOR) 50 MG tablet Take 1.5 tablets (75 mg total) by mouth 2 (two) times daily. Patient taking differently: Take 50 mg by mouth 2 (two) times daily.  11/29/19  Yes Martinique, Peter M, MD  mupirocin ointment (BACTROBAN) 2 % Apply 1 application topically 2 (two) times daily as needed (to affected areas- irritated hair follicles on face).  08/15/19  Yes [provider]  nitroGLYCERIN (NITROSTAT) 0.4 MG SL tablet Place 1 tablet (0.4 mg total) under the tongue every 5 (five) minutes as needed for chest pain. 10/15/11  Yes Larey Dresser, MD  ondansetron (ZOFRAN) 4 MG tablet Take 1 tablet (4 mg total) by mouth every 8 (eight) hours as needed for nausea or vomiting. 01/20/19  Yes Mesner, Corene Cornea, MD  pantoprazole (PROTONIX) 40 MG tablet Take 1 tablet (40 mg total) by mouth daily. NEED OV. Patient taking differently: Take 40 mg by mouth daily before breakfast.  11/25/18  Yes Martinique, Peter M, MD  Probiotic Product (PROBIOTIC-10 PO) Take 1 capsule by mouth daily.    Yes [provider]  doxycycline (VIBRA-TABS) 100 MG tablet Take 1 tablet (100 mg total) by mouth 2 (two) times daily. Patient not taking: Reported on 01/25/2020 11/30/19   Tyson Dense T, DPM  furosemide (LASIX) 20 MG tablet Take 1 tablet (20 mg total) by mouth daily as needed for fluid  or edema (or SOB or weight gain). Patient not taking: Reported on 01/25/2020 03/27/19   Cristal Ford, DO  metroNIDAZOLE (FLAGYL) 500 MG tablet Take 1 tablet (500 mg total) by mouth 2 (two) times daily. Patient not taking: Reported on 01/25/2020 10/05/19   Corena Herter, PA-C  NEOMYCIN-POLYMYXIN-HYDROCORTISONE (CORTISPORIN) 1 % SOLN OTIC solution Apply 1-2 drops to toe BID after soaking Patient not taking: Reported on 01/25/2020 11/14/19   Hyatt, Max T, DPM  traMADol (ULTRAM) 50 MG tablet Take 1-2 tablets (50-100 mg total) by mouth every 6 (six) hours as needed for moderate pain or severe pain. Patient not taking: Reported on 01/25/2020 03/23/19   Michael Boston, MD    Physical Exam: Vitals:   01/25/20 1131 01/25/20 1300 01/25/20 1400  BP: 101/67 133/90 110/67  Pulse: (!) 52 (!) 58 65  Resp:  18 20  Temp: 98.3 F (36.8 C)    TempSrc: Oral    SpO2: 93% 90% 94%  Weight: 86.2 kg    Height: 6' (1.829 m)      Constitutional: NAD, calm, comfortable, communicating well, appears dehydrated Eyes: PERRL, lids and conjunctivae normal ENMT: Mucous membranes are dry. Posterior pharynx clear of any exudate or lesions.Normal dentition.  Neck: normal, supple, no masses, no thyromegaly Respiratory: clear to auscultation bilaterally, no wheezing, no crackles. Normal respiratory effort. No accessory muscle use.  Cardiovascular: Tachycardic, no murmurs / rubs / gallops. No extremity edema. 2+ pedal pulses. No carotid bruits.  Abdomen: no tenderness, no masses palpated. No hepatosplenomegaly. Bowel sounds positive.  Musculoskeletal: no clubbing / cyanosis. No joint deformity upper and lower extremities. Good ROM, no contractures. Normal muscle tone.  Skin: no rashes, lesions, ulcers. No induration Neurologic: CN 2-12 grossly intact. Sensation intact, DTR normal. Strength 5/5 in all 4.  Psychiatric: Normal judgment and insight. Alert and oriented x 3. Normal mood.    Labs on Admission: I have personally  reviewed following labs and imaging studies  CBC: Recent Labs  Lab 01/25/20 1133 01/25/20 1206 01/25/20 1214  WBC 11.1* 12.7*  --   NEUTROABS  --  9.5*  --   HGB 15.2 15.8 16.3  HCT 47.0 49.7 48.0  MCV 91.1 92.2  --   PLT 306 298  --    Basic Metabolic Panel: Recent Labs  Lab 01/25/20  1133 01/25/20 1214  NA 138 139  K 3.5 3.7  CL 103 105  CO2 22  --   GLUCOSE 137* 147*  BUN 14 16  CREATININE 1.41* 1.50*  CALCIUM 9.5  --    GFR: Estimated Creatinine Clearance: 43.1 mL/min (A) (by C-G formula based on SCr of 1.5 mg/dL (H)). Liver Function Tests: Recent Labs  Lab 01/25/20 1133  AST 20  ALT 14  ALKPHOS 59  BILITOT 1.1  PROT 7.4  ALBUMIN 4.2   Recent Labs  Lab 01/25/20 1206  LIPASE 19   No results for input(s): AMMONIA in the last 168 hours. Coagulation Profile: No results for input(s): INR, PROTIME in the last 168 hours. Cardiac Enzymes: No results for input(s): CKTOTAL, CKMB, CKMBINDEX, TROPONINI in the last 168 hours. BNP (last 3 results) No results for input(s): PROBNP in the last 8760 hours. HbA1C: No results for input(s): HGBA1C in the last 72 hours. CBG: No results for input(s): GLUCAP in the last 168 hours. Lipid Profile: No results for input(s): CHOL, HDL, LDLCALC, TRIG, CHOLHDL, LDLDIRECT in the last 72 hours. Thyroid Function Tests: No results for input(s): TSH, T4TOTAL, FREET4, T3FREE, THYROIDAB in the last 72 hours. Anemia Panel: No results for input(s): VITAMINB12, FOLATE, FERRITIN, TIBC, IRON, RETICCTPCT in the last 72 hours. Urine analysis:    Component Value Date/Time   COLORURINE YELLOW 03/25/2019 1002   APPEARANCEUR CLEAR 03/25/2019 1002   LABSPEC 1.012 03/25/2019 1002   PHURINE 5.0 03/25/2019 1002   GLUCOSEU NEGATIVE 03/25/2019 1002   HGBUR SMALL (A) 03/25/2019 1002   [place] 03/25/2019 [place] 03/25/2019 [place] 03/25/2019 1002   UROBILINOGEN 0.2 01/19/2008 1246   NITRITE  NEGATIVE 03/25/2019 [place] 03/25/2019 1002    Radiological Exams on Admission: CT ABDOMEN PELVIS WO CONTRAST  Result Date: 01/25/2020 CLINICAL DATA:  Nausea and vomiting. EXAM: CT ABDOMEN AND PELVIS WITHOUT CONTRAST TECHNIQUE: Multidetector CT imaging of the abdomen and pelvis was performed following the standard protocol without IV contrast. COMPARISON:  CT scan dated 10/05/2019 FINDINGS: Lower chest: No acute abnormalities. Hepatobiliary: No focal liver abnormality is seen. Status post cholecystectomy. No biliary dilatation. Pancreas: There are few chronic calcifications in the uncinate process of the pancreas, stable. The pancreas is otherwise normal. Spleen: Normal in size without focal abnormality. Adrenals/Urinary Tract: Adrenal glands are normal. Kidneys are normal except for a benign-appearing 5.3 cm cyst on the lower pole of the right kidney. Left kidney is normal. No hydronephrosis. Bladder is normal. Stomach/Bowel: Stomach is within normal limits. Appendix appears normal. No evidence of bowel wall thickening, distention, or inflammatory changes. The cecum lies in the right mid abdomen as does the appendix. Vascular/Lymphatic: Aortic atherosclerosis. No enlarged abdominal or pelvic lymph nodes. Reproductive: Prostate is unremarkable. Other: No abdominal wall hernia or abnormality. No abdominopelvic ascites. Musculoskeletal: No acute abnormality. Severe degenerative disc disease in the lower lumbar spine. IMPRESSION: Benign-appearing abdomen and pelvis. Aortic Atherosclerosis (ICD10-I70.0). Electronically Signed   By: Lorriane Shire M.D.   On: 01/25/2020 15:34   DG Chest Portable 1 View  Result Date: 01/25/2020 CLINICAL DATA:  Acute supraventricular tachycardia EXAM: PORTABLE CHEST 1 VIEW COMPARISON:  Portable exam 1213 hours compared to 03/25/2019 FINDINGS: Normal heart size, mediastinal contours, and pulmonary vascularity. Atherosclerotic calcification aorta. Mild  elevation of RIGHT diaphragm with RIGHT basilar atelectasis. Lungs otherwise clear. No acute infiltrate, pleural effusion or pneumothorax. Osseous structures unremarkable. IMPRESSION: Mild RIGHT basilar atelectasis. Electronically Signed  By: Lavonia Dana M.D.   On: 01/25/2020 12:22    EKG: Independently reviewed. Supraventricular tachycardia, Left ventricular hypertrophy with repolarization abnormality.  Assessment/Plan Principal Problem:   Atrial fibrillation with rapid ventricular response (H[facility]) Active Problems:   Coronary artery disease   GERD   SVT (supraventricular tachycardia) ([facility])   HTN (hypertension)   Hypothyroidism   AKI (acute kidney injury) ([place])   New onset of A. fib with RVR: -Likely secondary to dehydration due to vomiting and decreased p.o. intake. -EKG upon arrival showed SVT.  Reviewed CT abdomen/pelvis result.  Reviewed chest x-ray result.  Patient is afebrile.  Has leukocytosis of 12.7.  Will check lactic acid. -Received IV fluid bolus, Zofran and diltiazem in ED. -Admit patient to stepdown unit for close monitoring. -Continue IV diltiazem.  Continue IV fluids.  Keep him n.p.o.  Zofran as needed for nausea and vomiting. -Check electrolytes, TSH. -We will get transthoracic echo. -CHA2DS2-VASc score is 4.  We will consult cardiology-discussed with Dr. Harl Bowie, he will come and evaluate the patient. -Defer to start anticoagulation to cardiology.  Coronary artery disease: -Continue aspirin, statin, Plavix -Hold metoprolol as patient is on diltiazem  Hypothyroidism: Check TSH -Continue levothyroxine  Hypertension: Blood pressure is stable -Hold lisinopril for now due to AKI.  Hold metoprolol for now as patient is on diltiazem. -Auditor blood pressure closely.  AKI on CKD stage III: Likely secondary to dehydration -Hold nephrotoxic medication and monitor kidney function closely.  Continue IV fluids.  GERD: Continue PPI  Depression: Continue Cymbalta  DVT  prophylaxis: Lovenox/SCD/TED Code Status: DNR-confirmed with the patient Family Communication: None present at bedside.  Plan of care discussed with patient in length and he verbalized understanding and agreed with it. Disposition Plan: To be determined Consults called: Cardiology Admission status: Inpatient  Mckinley Jewel MD [facility] Pager [phone]  If 7PM-7AM, please contact night-coverage [email or web address] Password [email or web address]  01/25/2020, 4:41 PM

## 2020-01-25 NOTE — ED Notes (Signed)
Attempted report 

## 2020-01-25 NOTE — ED Provider Notes (Signed)
[facility] EMERGENCY DEPARTMENT Provider Note   CSN: [id number] Arrival date & time: 01/25/20  1110     History No chief complaint on file.   Johnny [name] is a 79 y.o. male.  HPI 79 year old male presents with vomiting.  Started 2 days ago after what he assumes was bad barbecue.  No diarrhea.  Has some abdominal pain just prior to vomiting.  Has vomited numerous times.  Denies any blood in his emesis.  No fevers, chest pain, shortness of breath.  Was found to have elevated heart rate in triage consistent with SVT and so was brought immediately back.  He feels a little dizzy and has been feeling so for the last 2 days. Has not felt any palpitations.   Past Medical History:  Diagnosis Date  . Acromioclavicular joint arthritis   . Anemia   . B12 deficiency 10/15/11   "take injections q 28 days"  . Coronary artery disease    LHC 10/15/11: LAD stent patent with mid ectasia, distal LAD 50-60%, mid circumflex with ectasia, EF 55-65%.  . Dyslipidemia   . GERD (gastroesophageal reflux disease)   . Glucose intolerance (pre-diabetes)    Borderline glucose intolerance  . Hypertension   . Hypothyroidism   . Post concussive syndrome 2011   "for ~ 6-8 months"  . Rotator cuff tear   . Shortness of breath 10/15/11   "here lately I've been having it on & off any time"  . SVT (supraventricular tachycardia) ([place])    maintained on beta blocker    Patient Active Problem List   Diagnosis Date Noted  . Acute metabolic encephalopathy [id number]  . Acute on chronic cholecystitis s/p lap cholecystectomy 03/23/2019 03/23/2019  . Chronic anticoagulation 03/23/2019  . Chronic cholecystitis 03/23/2019  . Unstable angina ([place]) 11/05/2018  . Tachycardia 10/29/2018  . Pain in left hip 08/13/2018  . Trochanteric bursitis of left hip 06/01/2018  . Hypothyroidism 11/04/2011  . SVT (supraventricular tachycardia) ([place]) 04/10/2011  . HTN (hypertension) 04/10/2011  . Dyslipidemia  04/10/2011  . Coronary atherosclerosis 11/25/2010  . GERD 11/25/2010  . CONSTIPATION 11/25/2010  . CHEST PAIN UNSPECIFIED 11/25/2010  . PERSONAL HISTORY OF COLONIC POLYPS 11/25/2010    Past Surgical History:  Procedure Laterality Date  . BACK SURGERY    . CARDIAC CATHETERIZATION  12/19/08   NORMAL. EF 55%  . CARDIAC CATHETERIZATION  10/15/11  . CARDIOVASCULAR STRESS TEST  10/2010  . CARPAL TUNNEL RELEASE     bilaterally  . CHOLECYSTECTOMY  03/23/2019  . COLONOSCOPY W/ ENDOSCOPIC [place]    . CORONARY ANGIOPLASTY WITH STENT PLACEMENT  9/ 2005   . JOINT REPLACEMENT    . LAPAROSCOPIC CHOLECYSTECTOMY SINGLE SITE WITH INTRAOPERATIVE CHOLANGIOGRAM N/A 03/23/2019   Procedure: SINGLE SITE LAPAROSCOPIC CHOLECYSTECTOMY WITH INTRAOPERATIVE CHOLANGIOGRAM;  Surgeon: Michael Boston, MD;  Location: [facility];  Service: General;  Laterality: N/A;  . LEFT HEART CATH AND CORONARY ANGIOGRAPHY N/A 11/05/2018   Procedure: LEFT HEART CATH AND CORONARY ANGIOGRAPHY;  Surgeon: Martinique, Peter M, MD;  Location: [facility] CV LAB;  Service: Cardiovascular;  Laterality: N/A;  . LEFT HEART CATHETERIZATION WITH CORONARY ANGIOGRAM N/A 10/15/2011   Procedure: LEFT HEART CATHETERIZATION WITH CORONARY ANGIOGRAM;  Surgeon: Sherren Mocha, MD;  Location: [facility] CATH LAB;  Service: Cardiovascular;  Laterality: N/A;  . LUMBAR LAMINECTOMY  1970's  . NASAL SINUS SURGERY     x2  . reconstructive shoulder surgery     right  . REPLACEMENT TOTAL KNEE  12/2007  left  . RESECTION TUMOR CLAVICLE RADICAL     Open distal clavicle resection  . SHOULDER SURGERY  02/11/2011   right  . TRIGGER FINGER RELEASE     ? both hands       Family History  Problem Relation Age of Onset  . Stroke Mother   . Heart attack Father   . Diabetes Father   . Kidney failure Father     Social History   Tobacco Use  . Smoking status: Former Smoker    Packs/day: 1.00    Years: 21.00    Pack years: 21.00    Types: Cigarettes    Quit date: 04/07/1976     Years since quitting: 43.8  . Smokeless tobacco: Never Used  Substance Use Topics  . Alcohol use: Not Currently    Comment: 10/15/11 "haven't drank in 15-20 years"  . Drug use: No    Home Medications Prior to Admission medications   Medication Sig Start Date End Date Taking? Authorizing Provider  acetaminophen (TYLENOL) 650 MG CR tablet Take 1,300 mg by mouth 2 (two) times daily.    Yes [provider]  ANORO ELLIPTA 62.5-25 MCG/INH AEPB Inhale 1 puff into the lungs daily as needed (shortness of breath).  01/06/19  Yes [provider]  aspirin 81 MG tablet Take 81 mg by mouth daily.     Yes [provider]  atorvastatin (LIPITOR) 10 MG tablet Take 1 tablet (10 mg total) by mouth daily. 01/02/20  Yes Martinique, Peter M, MD  Cholecalciferol (VITAMIN D3) 50 MCG (2000 UT) capsule Take 2,000 Units by mouth every evening.   Yes [provider]  clopidogrel (PLAVIX) 75 MG tablet Take 1 tablet (75 mg total) by mouth daily. 01/02/20  Yes Martinique, Peter M, MD  Cyanocobalamin (VITAMIN B-12 IJ) Inject 1,000 mcg as directed every 30 (thirty) days.    Yes [provider]  diclofenac sodium (VOLTAREN) 1 % GEL Apply 2 g topically 4 (four) times daily. Patient taking differently: Apply 2 g topically 4 (four) times daily as needed (hip pain).  08/13/18  Yes Aundra Dubin, PA-C  DULoxetine (CYMBALTA) 30 MG capsule Take 30 mg by mouth every evening.   Yes [provider]  fluticasone (FLONASE) 50 MCG/ACT nasal spray Place 1 spray into both nostrils 2 (two) times daily as needed for allergies or rhinitis.  01/12/20  Yes [provider]  Glucos-Chond-Hyal Ac-Ca Fructo (MOVE FREE JOINT HEALTH ADVANCE PO) Take 1 capsule by mouth 2 (two) times daily.   Yes [provider]  hydrOXYzine (ATARAX) 25 MG tablet Take 25 mg by mouth at bedtime.    Yes [provider]  levothyroxine (SYNTHROID, LEVOTHROID) 25 MCG tablet Take 25 mcg by mouth daily  before breakfast.    Yes [provider]  lisinopril (ZESTRIL) 20 MG tablet Take 1 tablet (20 mg total) by mouth daily. 01/02/20 12/27/20 Yes Martinique, Peter M, MD  loperamide (IMODIUM A-D) 2 MG tablet Take 2-4 mg by mouth 4 (four) times daily as needed for diarrhea or loose stools.    Yes [provider]  meclizine (ANTIVERT) 25 MG tablet Take 25 mg by mouth 2 (two) times daily as needed for dizziness or nausea.    Yes [provider]  metoprolol tartrate (LOPRESSOR) 50 MG tablet Take 1.5 tablets (75 mg total) by mouth 2 (two) times daily. Patient taking differently: Take 50 mg by mouth 2 (two) times daily.  11/29/19  Yes Martinique, Peter M,  MD  mupirocin ointment (BACTROBAN) 2 % Apply 1 application topically 2 (two) times daily as needed (to affected areas- irritated hair follicles on face).  08/15/19  Yes [provider]  nitroGLYCERIN (NITROSTAT) 0.4 MG SL tablet Place 1 tablet (0.4 mg total) under the tongue every 5 (five) minutes as needed for chest pain. 10/15/11  Yes Larey Dresser, MD  ondansetron (ZOFRAN) 4 MG tablet Take 1 tablet (4 mg total) by mouth every 8 (eight) hours as needed for nausea or vomiting. 01/20/19  Yes Mesner, Corene Cornea, MD  pantoprazole (PROTONIX) 40 MG tablet Take 1 tablet (40 mg total) by mouth daily. NEED OV. Patient taking differently: Take 40 mg by mouth daily before breakfast.  11/25/18  Yes Martinique, Peter M, MD  Probiotic Product (PROBIOTIC-10 PO) Take 1 capsule by mouth daily.    Yes [provider]  doxycycline (VIBRA-TABS) 100 MG tablet Take 1 tablet (100 mg total) by mouth 2 (two) times daily. Patient not taking: Reported on 01/25/2020 11/30/19   Tyson Dense T, DPM  furosemide (LASIX) 20 MG tablet Take 1 tablet (20 mg total) by mouth daily as needed for fluid or edema (or SOB or weight gain). Patient not taking: Reported on 01/25/2020 03/27/19   Cristal Ford, DO  metroNIDAZOLE (FLAGYL) 500 MG tablet Take 1 tablet (500 mg total) by  mouth 2 (two) times daily. Patient not taking: Reported on 01/25/2020 10/05/19   Corena Herter, PA-C  NEOMYCIN-POLYMYXIN-HYDROCORTISONE (CORTISPORIN) 1 % SOLN OTIC solution Apply 1-2 drops to toe BID after soaking Patient not taking: Reported on 01/25/2020 11/14/19   Hyatt, Max T, DPM  traMADol (ULTRAM) 50 MG tablet Take 1-2 tablets (50-100 mg total) by mouth every 6 (six) hours as needed for moderate pain or severe pain. Patient not taking: Reported on 01/25/2020 03/23/19   Michael Boston, MD    Allergies    Patient has no known allergies.  Review of Systems   Review of Systems  Constitutional: Negative for fever.  Respiratory: Negative for shortness of breath.   Cardiovascular: Negative for chest pain and palpitations.  Gastrointestinal: Positive for abdominal pain, nausea and vomiting. Negative for diarrhea.  All other systems reviewed and are negative.   Physical Exam Updated Vital Signs BP 110/67   Pulse 65   Temp 98.3 F (36.8 C) (Oral)   Resp 20   Ht 6' (1.829 m)   Wt 86.2 kg   SpO2 94%   BMI 25.77 kg/m   Physical Exam Vitals and nursing note reviewed.  Constitutional:      General: He is in acute distress.     Appearance: He is well-developed. He is not diaphoretic.  HENT:     Head: Normocephalic and atraumatic.     Right Ear: External ear normal.     Left Ear: External ear normal.     Nose: Nose normal.  Eyes:     General:        Right eye: No discharge.        Left eye: No discharge.  Cardiovascular:     Rate and Rhythm: Regular rhythm. Tachycardia present.     Heart sounds: Normal heart sounds.  Pulmonary:     Effort: Pulmonary effort is normal.     Breath sounds: Normal breath sounds.  Abdominal:     Palpations: Abdomen is soft.     Tenderness: There is abdominal tenderness in the right upper quadrant.  Musculoskeletal:     Cervical back: Neck supple.  Skin:  General: Skin is warm and dry.  Neurological:     Mental Status: He is alert.    Psychiatric:        Mood and Affect: Mood is not anxious.     ED Results / Procedures / Treatments   Labs (all labs ordered are listed, but only abnormal results are displayed) Labs Reviewed  CBC - Abnormal; Notable for the following components:      Result Value   WBC 11.1 (*)    All other components within normal limits  COMPREHENSIVE METABOLIC PANEL - Abnormal; Notable for the following components:   Glucose, Bld 137 (*)    Creatinine, Ser 1.41 (*)    GFR calc non Af Amer 47 (*)    GFR calc Af Amer 54 (*)    All other components within normal limits  CBC WITH DIFFERENTIAL/PLATELET - Abnormal; Notable for the following components:   WBC 12.7 (*)    Neutro Abs 9.5 (*)    Monocytes Absolute 1.2 (*)    All other components within normal limits  I-STAT CHEM 8, ED - Abnormal; Notable for the following components:   Creatinine, Ser 1.50 (*)    Glucose, Bld 147 (*)    Calcium, Ion 1.11 (*)    All other components within normal limits  SARS CORONAVIRUS 2 (TAT 6-24 HRS)  LIPASE, BLOOD  URINALYSIS, ROUTINE W REFLEX MICROSCOPIC  CBG MONITORING, ED    EKG EKG Interpretation  Date/Time:  Wednesday January 25 2020 11:59:47 EDT Ventricular Rate:  170 PR Interval:    QRS Duration: 115 QT Interval:  300 QTC Calculation: 505 R Axis:   -38 Text Interpretation: Supraventricular tachycardia LVH with IVCD, LAD and secondary repol abnrm Confirmed by Sherwood Gambler [phone]) on 01/25/2020 12:11:29 PM   Radiology CT ABDOMEN PELVIS WO CONTRAST  Result Date: 01/25/2020 CLINICAL DATA:  Nausea and vomiting. EXAM: CT ABDOMEN AND PELVIS WITHOUT CONTRAST TECHNIQUE: Multidetector CT imaging of the abdomen and pelvis was performed following the standard protocol without IV contrast. COMPARISON:  CT scan dated 10/05/2019 FINDINGS: Lower chest: No acute abnormalities. Hepatobiliary: No focal liver abnormality is seen. Status post cholecystectomy. No biliary dilatation. Pancreas: There are few chronic  calcifications in the uncinate process of the pancreas, stable. The pancreas is otherwise normal. Spleen: Normal in size without focal abnormality. Adrenals/Urinary Tract: Adrenal glands are normal. Kidneys are normal except for a benign-appearing 5.3 cm cyst on the lower pole of the right kidney. Left kidney is normal. No hydronephrosis. Bladder is normal. Stomach/Bowel: Stomach is within normal limits. Appendix appears normal. No evidence of bowel wall thickening, distention, or inflammatory changes. The cecum lies in the right mid abdomen as does the appendix. Vascular/Lymphatic: Aortic atherosclerosis. No enlarged abdominal or pelvic lymph nodes. Reproductive: Prostate is unremarkable. Other: No abdominal wall hernia or abnormality. No abdominopelvic ascites. Musculoskeletal: No acute abnormality. Severe degenerative disc disease in the lower lumbar spine. IMPRESSION: Benign-appearing abdomen and pelvis. Aortic Atherosclerosis (ICD10-I70.0). Electronically Signed   By: Lorriane Shire M.D.   On: 01/25/2020 15:34   DG Chest Portable 1 View  Result Date: 01/25/2020 CLINICAL DATA:  Acute supraventricular tachycardia EXAM: PORTABLE CHEST 1 VIEW COMPARISON:  Portable exam 1213 hours compared to 03/25/2019 FINDINGS: Normal heart size, mediastinal contours, and pulmonary vascularity. Atherosclerotic calcification aorta. Mild elevation of RIGHT diaphragm with RIGHT basilar atelectasis. Lungs otherwise clear. No acute infiltrate, pleural effusion or pneumothorax. Osseous structures unremarkable. IMPRESSION: Mild RIGHT basilar atelectasis. Electronically Signed   By: Crist Infante.D.  On: 01/25/2020 12:22    Procedures .Critical Care Performed by: Sherwood Gambler, MD Authorized by: Sherwood Gambler, MD   Critical care provider statement:    Critical care time (minutes):  35   Critical care time was exclusive of:  Separately billable procedures and treating other patients   Critical care was necessary to  treat or prevent imminent or life-threatening deterioration of the following conditions:  Cardiac failure and circulatory failure   Critical care was time spent personally by me on the following activities:  Discussions with consultants, evaluation of patient's response to treatment, examination of patient, ordering and performing treatments and interventions, ordering and review of laboratory studies, ordering and review of radiographic studies, pulse oximetry, re-evaluation of patient's condition, obtaining history from patient or surrogate and review of old charts   (including critical care time)  Medications Ordered in ED Medications  diltiazem (CARDIZEM) 1 mg/mL load via infusion 15 mg (has no administration in time range)    And  diltiazem (CARDIZEM) 125 mg in dextrose 5% 125 mL (1 mg/mL) infusion (has no administration in time range)  sodium chloride 0.9 % bolus 1,000 mL (0 mLs Intravenous Stopped 01/25/20 1445)  ondansetron (ZOFRAN) injection 4 mg (4 mg Intravenous Given 01/25/20 1211)  sodium chloride 0.9 % bolus 1,000 mL (1,000 mLs Intravenous New Bag/Given 01/25/20 1450)    ED Course  I have reviewed the triage vital signs and the nursing notes.  Pertinent labs & imaging results that were available during my care of the patient were reviewed by me and considered in my medical decision making (see chart for details).    MDM Rules/Calculators/A&P                      Patient appears to have acute SVT but then this change into A. fib with a slower rate but still elevated around 120.  Has maintained heart rate in the 120s and 130s despite IV fluids.  Does have a bump in his creatinine but not kidney failure.  CT does not show any obvious intra-abdominal cause.  Unclear if the A. fib is reactionary or the cause of all of his symptoms.  He does not feel palpitations and does not have chest pain/shortness of breath.  Given the unclear exact time of A. fib he is not an ED cardioversion  candidate.  Home health nurse at the bedside reports that he has has had A. fib before but I do not see this in his chart.  He will need rate control at this point and I have discussed with Dr. Doristine Bosworth for admission. Final Clinical Impression(s) / ED Diagnoses Final diagnoses:  Atrial fibrillation with RVR ([place])  Acute dehydration    Rx / DC Orders ED Discharge Orders    None       Sherwood Gambler, MD 01/25/20 1557

## 2020-01-26 ENCOUNTER — Inpatient Hospital Stay (HOSPITAL_COMMUNITY): Payer: Medicare Other

## 2020-01-26 DIAGNOSIS — I5032 Chronic diastolic (congestive) heart failure: Secondary | ICD-10-CM

## 2020-01-26 DIAGNOSIS — R5381 Other malaise: Secondary | ICD-10-CM

## 2020-01-26 DIAGNOSIS — R1110 Vomiting, unspecified: Secondary | ICD-10-CM

## 2020-01-26 DIAGNOSIS — E039 Hypothyroidism, unspecified: Secondary | ICD-10-CM

## 2020-01-26 DIAGNOSIS — I4891 Unspecified atrial fibrillation: Secondary | ICD-10-CM

## 2020-01-26 DIAGNOSIS — E860 Dehydration: Secondary | ICD-10-CM

## 2020-01-26 DIAGNOSIS — N179 Acute kidney failure, unspecified: Secondary | ICD-10-CM

## 2020-01-26 DIAGNOSIS — Z66 Do not resuscitate: Secondary | ICD-10-CM

## 2020-01-26 DIAGNOSIS — I25110 Atherosclerotic heart disease of native coronary artery with unstable angina pectoris: Secondary | ICD-10-CM

## 2020-01-26 LAB — BASIC METABOLIC PANEL
Anion gap: 9 (ref 5–15)
BUN: 11 mg/dL (ref 8–23)
CO2: 23 mmol/L (ref 22–32)
Calcium: 8.5 mg/dL — ABNORMAL LOW (ref 8.9–10.3)
Chloride: 108 mmol/L (ref 98–111)
Creatinine, Ser: 1.19 mg/dL (ref 0.61–1.24)
GFR calc Af Amer: >60 mL/min (ref >60)
GFR calc non Af Amer: 57 mL/min — ABNORMAL LOW (ref >60)
Glucose, Bld: 102 mg/dL — ABNORMAL HIGH (ref 70–99)
Potassium: 3.3 mmol/L — ABNORMAL LOW (ref 3.5–5.1)
Sodium: 140 mmol/L (ref 135–145)

## 2020-01-26 LAB — CBC
HCT: 41.5 % (ref 39.0–52.0)
Hemoglobin: 13.6 g/dL (ref 13.0–17.0)
MCH: 29.7 pg (ref 26.0–34.0)
MCHC: 32.8 g/dL (ref 30.0–36.0)
MCV: 90.6 fL (ref 80.0–100.0)
Platelets: 225 10*3/uL (ref 150–400)
RBC: 4.58 MIL/uL (ref 4.22–5.81)
RDW: 13.5 % (ref 11.5–15.5)
WBC: 11.4 10*3/uL — ABNORMAL HIGH (ref 4.0–10.5)
nRBC: 0 % (ref 0.0–0.2)

## 2020-01-26 LAB — GLUCOSE, CAPILLARY
Glucose-Capillary: 91 mg/dL (ref 70–99)
Glucose-Capillary: 86 mg/dL (ref 70–99)

## 2020-01-26 LAB — LACTIC ACID, PLASMA: Lactic Acid, Venous: 1.4 mmol/L (ref 0.5–1.9)

## 2020-01-26 LAB — MAGNESIUM: Magnesium: 1.9 mg/dL (ref 1.7–2.4)

## 2020-01-26 LAB — ECHOCARDIOGRAM COMPLETE
Height: 72 in
Weight: 3153.46 oz

## 2020-01-26 MED ORDER — LACTATED RINGERS IV BOLUS
250.0000 mL | Freq: Once | INTRAVENOUS | Status: AC
  Administered 2020-01-26: 01:00:00 250 mL via INTRAVENOUS

## 2020-01-26 MED ORDER — AMIODARONE HCL IN DEXTROSE 360-4.14 MG/200ML-% IV SOLN
30.0000 mg/h | INTRAVENOUS | Status: DC
  Administered 2020-01-26: 19:00:00 30 mg/h via INTRAVENOUS
  Filled 2020-01-26: qty 200

## 2020-01-26 MED ORDER — ASPIRIN 81 MG PO CHEW
81.0000 mg | CHEWABLE_TABLET | Freq: Every day | ORAL | Status: DC
  Administered 2020-01-26 – 2020-01-27 (×2): 81 mg via ORAL
  Filled 2020-01-26 (×2): qty 1

## 2020-01-26 MED ORDER — METOPROLOL TARTRATE 25 MG PO TABS
25.0000 mg | ORAL_TABLET | Freq: Two times a day (BID) | ORAL | Status: DC
  Administered 2020-01-26 – 2020-01-27 (×2): 25 mg via ORAL
  Filled 2020-01-26 (×2): qty 1

## 2020-01-26 MED ORDER — POTASSIUM CHLORIDE CRYS ER 20 MEQ PO TBCR
40.0000 meq | EXTENDED_RELEASE_TABLET | ORAL | Status: AC
  Administered 2020-01-26 (×2): 40 meq via ORAL
  Filled 2020-01-26 (×2): qty 2

## 2020-01-26 MED ORDER — AMIODARONE HCL 200 MG PO TABS
200.0000 mg | ORAL_TABLET | Freq: Two times a day (BID) | ORAL | Status: DC
  Administered 2020-01-27: 06:00:00 200 mg via ORAL
  Filled 2020-01-26: qty 1

## 2020-01-26 MED ORDER — AMIODARONE HCL IN DEXTROSE 360-4.14 MG/200ML-% IV SOLN
60.0000 mg/h | INTRAVENOUS | Status: DC
  Administered 2020-01-26: 13:00:00 60 mg/h via INTRAVENOUS
  Filled 2020-01-26: qty 200

## 2020-01-26 MED ORDER — AMIODARONE IV BOLUS ONLY 150 MG/100ML
150.0000 mg | Freq: Once | INTRAVENOUS | Status: AC
  Administered 2020-01-26: 13:00:00 150 mg via INTRAVENOUS
  Filled 2020-01-26: qty 100

## 2020-01-26 NOTE — Progress Notes (Signed)
Cardiology Progress Note  Patient ID: Johnny [name] MRN: [id number] DOB: January 06, 1941 Date of Encounter: 01/26/2020  Primary Cardiologist: Peter Martinique, MD  Subjective  Converted to NSR while in room. Feeling better.   ROS:  All other ROS reviewed and negative. Pertinent positives noted in the HPI.     Inpatient Medications  Scheduled Meds: . apixaban  5 mg Oral BID  . aspirin  81 mg Oral Daily  . atorvastatin  10 mg Oral Daily  . DULoxetine  30 mg Oral QPM  . hydrOXYzine  25 mg Oral QHS  . levothyroxine  25 mcg Oral QAC breakfast  . pantoprazole  40 mg Oral QAC breakfast  . potassium chloride  40 mEq Oral Q3H   Continuous Infusions: . sodium chloride 100 mL/hr at 01/26/20 0331  . amiodarone     Followed by  . amiodarone    . amiodarone     PRN Meds: acetaminophen **OR** acetaminophen, ondansetron **OR** ondansetron (ZOFRAN) IV   Vital Signs   Vitals:   01/25/20 2251 01/26/20 0406 01/26/20 0651 01/26/20 0737  BP: 105/64 114/71 91/63 92/63   Pulse: 93 86 66 71  Resp: 17 16 17 20   Temp:  98.6 F (37 C)  98.6 F (37 C)  TempSrc:  Oral  Oral  SpO2: 92% 94% 94% 94%  Weight:      Height:        Intake/Output Summary (Last 24 hours) at 01/26/2020 0941 Last data filed at 01/26/2020 [id number] Gross per 24 hour  Intake 2745.35 ml  Output 280 ml  Net 2465.35 ml   Last 3 Weights 01/25/2020 01/25/2020 01/02/2020  Weight (lbs) 197 lb 1.5 oz 190 lb 199 lb 6.4 oz  Weight (kg) 89.4 kg 86.183 kg 90.447 kg      Telemetry  Overnight telemetry shows Afib, which I personally reviewed.   ECG  The most recent ECG shows Afib with RVR 160 bpm, which I personally reviewed.   Physical Exam   Vitals:   01/25/20 2251 01/26/20 0406 01/26/20 0651 01/26/20 0737  BP: 105/64 114/71 91/63 92/63   Pulse: 93 86 66 71  Resp: 17 16 17 20   Temp:  98.6 F (37 C)  98.6 F (37 C)  TempSrc:  Oral  Oral  SpO2: 92% 94% 94% 94%  Weight:      Height:         Intake/Output Summary (Last 24 hours) at  01/26/2020 0941 Last data filed at 01/26/2020 [id number] Gross per 24 hour  Intake 2745.35 ml  Output 280 ml  Net 2465.35 ml    Last 3 Weights 01/25/2020 01/25/2020 01/02/2020  Weight (lbs) 197 lb 1.5 oz 190 lb 199 lb 6.4 oz  Weight (kg) 89.4 kg 86.183 kg 90.447 kg    Body mass index is 26.73 kg/m.   General: ill-appearing  Head: Atraumatic, normal size  Eyes: PEERLA, EOMI  Neck: Supple, no JVD Endocrine: No thryomegaly Cardiac: Normal S1, S2; RRR; no murmurs, rubs, or gallops Lungs: Clear to auscultation bilaterally, no wheezing, rhonchi or rales  Abd: Soft, nontender, no hepatomegaly  Ext: No edema, pulses 2+ Musculoskeletal: No deformities, BUE and BLE strength normal and equal Skin: Warm and dry, no rashes   Neuro: Alert and oriented to person, place, time, and situation, CNII-XII grossly intact, no focal deficits  Psych: Normal mood and affect   Labs  High Sensitivity Troponin:  No results for input(s): TROPONINIHS in the last 720 hours.   Cardiac EnzymesNo results for input(s): TROPONINI  in the last 168 hours. No results for input(s): TROPIPOC in the last 168 hours.  Chemistry Recent Labs  Lab 01/25/20 1133 01/25/20 1133 01/25/20 1214 01/25/20 1725 01/26/20 0106  NA 138  --  139  --  140  K 3.5  --  3.7  --  3.3*  CL 103  --  105  --  108  CO2 22  --   --   --  23  GLUCOSE 137*  --  147*  --  102*  BUN 14  --  16  --  11  CREATININE 1.41*   < > 1.50* 1.24 1.19  CALCIUM 9.5  --   --   --  8.5*  PROT 7.4  --   --   --   --   ALBUMIN 4.2  --   --   --   --   AST 20  --   --   --   --   ALT 14  --   --   --   --   ALKPHOS 59  --   --   --   --   BILITOT 1.1  --   --   --   --   GFRNONAA 47*  --   --  55* 57*  GFRAA 54*  --   --  >60 >60  ANIONGAP 13  --   --   --  9   < > = values in this interval not displayed.    Hematology Recent Labs  Lab 01/25/20 1206 01/25/20 1206 01/25/20 1214 01/25/20 1725 01/26/20 0106  WBC 12.7*  --   --  10.4 11.4*  RBC 5.39  --   --   4.71 4.58  HGB 15.8   < > 16.3 13.9 13.6  HCT 49.7   < > 48.0 43.6 41.5  MCV 92.2  --   --  92.6 90.6  MCH 29.3  --   --  29.5 29.7  MCHC 31.8  --   --  31.9 32.8  RDW 13.5  --   --  13.4 13.5  PLT 298  --   --  244 225   < > = values in this interval not displayed.   BNPNo results for input(s): BNP, PROBNP in the last 168 hours.  DDimer No results for input(s): DDIMER in the last 168 hours.   Radiology  CT ABDOMEN PELVIS WO CONTRAST  Result Date: 01/25/2020 CLINICAL DATA:  Nausea and vomiting. EXAM: CT ABDOMEN AND PELVIS WITHOUT CONTRAST TECHNIQUE: Multidetector CT imaging of the abdomen and pelvis was performed following the standard protocol without IV contrast. COMPARISON:  CT scan dated 10/05/2019 FINDINGS: Lower chest: No acute abnormalities. Hepatobiliary: No focal liver abnormality is seen. Status post cholecystectomy. No biliary dilatation. Pancreas: There are few chronic calcifications in the uncinate process of the pancreas, stable. The pancreas is otherwise normal. Spleen: Normal in size without focal abnormality. Adrenals/Urinary Tract: Adrenal glands are normal. Kidneys are normal except for a benign-appearing 5.3 cm cyst on the lower pole of the right kidney. Left kidney is normal. No hydronephrosis. Bladder is normal. Stomach/Bowel: Stomach is within normal limits. Appendix appears normal. No evidence of bowel wall thickening, distention, or inflammatory changes. The cecum lies in the right mid abdomen as does the appendix. Vascular/Lymphatic: Aortic atherosclerosis. No enlarged abdominal or pelvic lymph nodes. Reproductive: Prostate is unremarkable. Other: No abdominal wall hernia or abnormality. No abdominopelvic ascites. Musculoskeletal: No acute abnormality. Severe degenerative disc  disease in the lower lumbar spine. IMPRESSION: Benign-appearing abdomen and pelvis. Aortic Atherosclerosis (ICD10-I70.0). Electronically Signed   By: Lorriane Shire M.D.   On: 01/25/2020 15:34   DG  Chest Portable 1 View  Result Date: 01/25/2020 CLINICAL DATA:  Acute supraventricular tachycardia EXAM: PORTABLE CHEST 1 VIEW COMPARISON:  Portable exam 1213 hours compared to 03/25/2019 FINDINGS: Normal heart size, mediastinal contours, and pulmonary vascularity. Atherosclerotic calcification aorta. Mild elevation of RIGHT diaphragm with RIGHT basilar atelectasis. Lungs otherwise clear. No acute infiltrate, pleural effusion or pneumothorax. Osseous structures unremarkable. IMPRESSION: Mild RIGHT basilar atelectasis. Electronically Signed   By: Lavonia Dana M.D.   On: 01/25/2020 12:22    Cardiac Studies  TTE - pending   LHC 11/05/2018  Previously placed Prox LAD stent (unknown type) is widely patent.  Mid LAD lesion is 45% stenosed.  LV end diastolic pressure is normal.   1. Nonobstructive CAD 2. Normal LVEDP  Patient Profile  Johnny [name] is a 79 y.o. male with history of CAD s/p PCI pLAD (cath with 45% mid LAD 2020), SVT who was admitted 01/25/2020 for dehydration/lactic acidosis found to be in Afib with RVR.   Assessment & Plan   1. New onset Afib with RVR -CHADSVASC = 3. Continue eliquis.  -back in NSR. EKG pending.  -transition to IV amiodarone for 24 hours. We will then transition to oral load. Suspect this is 2/2 dehydration and [place] not need amiodarone long term. -Drop plavix and continue ASA for CAD  2. CAD s/p PCI pLAD -LHC with 45% mid LAD -patent stent -continue ASA/statin. Drop plavix   For questions or updates, please contact [place] Please consult [email or web address] for contact info under   Time Spent with Patient: I have spent a total of 35 minutes with patient reviewing hospital notes, telemetry, EKGs, labs and examining the patient as well as establishing an assessment and plan that was discussed with the patient.  > 50% of time was spent in direct patient care.    Signed, Addison Naegeli. Audie Box, [place]  01/26/2020 9:41 AM

## 2020-01-26 NOTE — Progress Notes (Signed)
  Echocardiogram 2D Echocardiogram has been performed.  Johnny [name] 01/26/2020, 8:47 AM

## 2020-01-26 NOTE — Progress Notes (Signed)
PROGRESS NOTE  Johnny [name] [id number] DOB: [birth year]   PCP: Burnard Bunting, MD  Patient is from: Home.  Lives alone.  Has home health nurse.  DOA: 01/25/2020 LOS: 1  Brief Narrative / Interim history: 79 year old male with history of CAD s/p stent to proximal LAD in 2005 on chronic DAPT with last cath showing nonobstructive CAD in January [id number], diastolic CHF, PSVT, HTN, GERD and hypothyroidism brought to ED by his [facility] due to emesis and dehydration after barbecue 2 days earlier, and admitted with new onset atrial fibrillation, dehydration and AKI.  In ED, initially in SVT but change into A. fib with RVR.  WBC 12.7.  Creatinine 1.41.  Lipase normal.  LA 2.4 > 1.4.  CT abdomen and pelvis without acute finding.  CXR with right basilar atelectasis.  COVID-19 negative.  Received IV fluid boluses, Zofran and Cardizem, and admitted for new onset A. fib with RVR, dehydration and AKI.   Cardiology consulted.   Subjective: Seen and examined earlier this morning.  No major events overnight or this morning.  No complaints.  He denies chest pain, dyspnea, palpitation or dizziness.  No further nausea or emesis.  Denies abdominal pain, diarrhea or UTI symptoms.  Objective: Vitals:   01/26/20 0740 01/26/20 0830 01/26/20 0930 01/26/20 1300  BP: 92/63 110/66 118/79 127/70  Pulse: 83 (!) 48 65 67  Resp: 16 14 16 16   Temp:    97.8 F (36.6 C)  TempSrc:    Oral  SpO2: 94% 94% 95% 99%  Weight:      Height:        Intake/Output Summary (Last 24 hours) at 01/26/2020 1435 Last data filed at 01/26/2020 0619 Gross per 24 hour  Intake 2745.35 ml  Output 280 ml  Net 2465.35 ml   Filed Weights   01/25/20 1131 01/25/20 2058  Weight: 86.2 kg 89.4 kg    Examination:  GENERAL: No acute distress.  Appears well.  HEENT: MMM.  Vision and hearing grossly intact.  NECK: Supple.  No apparent JVD.  RESP: 96% on RA.  No IWOB.  Fair aeration bilaterally. CVS: RRR.  Heart sounds normal.  ABD/GI/GU: Bowel  sounds present. Soft. Non tender.  MSK/EXT:  Moves extremities. No apparent deformity. No edema.  SKIN: no apparent skin lesion or wound NEURO: Awake, alert and oriented self, place and situation.  No apparent focal neuro deficit. PSYCH: Calm. Normal affect.  Procedures:  None  Assessment & Plan: New onset A. fib with RVR-back in NSR.  CHA2DS2-VASc score 5 (age, CAD CHF and HTN).  TSH normal. -Cardiology managing. -On IV amiodarone with plan to transition to p.o. -Eliquis for anticoagulation.  -Drop Plavix and continue ASA for CAD -Resume metoprolol at reduced dose  Emesis/dehydration: unclear etiology of this.  Started after barbecue 2 days earlier.  Work-up including CMP, lipase and CT abdomen and pelvis not revealing.  Emesis resolved. -Continue gentle hydration and as needed antiemetics  AKI on CKD-3a: Baseline Cr ~1.15>> 1.41 (admit)> 1.5> 1.19 -Continue monitoring.  Hypokalemia: -Replenish and recheck.  Lactic acidosis/leukocytosis/bandemia: Likely due to dehydration.  Resolved.  CAD s/p stent to proximal LAD in 2005 with cath in January 2020 showing nonobstructive CAD.  No anginal symptoms. -Eliquis, aspirin and a statin. -Resume metoprolol at reduced dose.  Essential hypertension: Normotensive. -Continue holding home Lasix and lisinopril  Chronic diastolic CHF: Echo with EF of 50 to 55% and G1 DD.  Patient was dehydrated due to emesis on presentation.  Appears euvolemic now.  On Lasix 20 mg as needed at home. -Hold home Lasix in the setting of dehydration -Monitor fluid status  GERD: -Continue PPI  Depression: Stable. -Continue Cymbalta  Debility/physical deconditioning: Patient lives alone. -PT/OT  DNR/DNI                 DVT prophylaxis: On Eliquis for A. fib Code Status: DNR/DNI Family Communication: Patient and/or RN. Available if any question.  Discharge barrier: Atrial fibrillation on IV amiodarone.  Patient is from: Home. Final  disposition: To be determined  Consultants: Cardiology   Microbiology summarized: [id number] negative.  Sch Meds:  Scheduled Meds: . apixaban  5 mg Oral BID  . aspirin  81 mg Oral Daily  . atorvastatin  10 mg Oral Daily  . DULoxetine  30 mg Oral QPM  . hydrOXYzine  25 mg Oral QHS  . levothyroxine  25 mcg Oral QAC breakfast  . pantoprazole  40 mg Oral QAC breakfast   Continuous Infusions: . sodium chloride 100 mL/hr at 01/26/20 0331  . amiodarone 60 mg/hr (01/26/20 1312)   Followed by  . amiodarone     PRN Meds:.acetaminophen **OR** acetaminophen, ondansetron **OR** ondansetron (ZOFRAN) IV  Antimicrobials: Anti-infectives (From admission, onward)   None       I have personally reviewed the following labs and images: CBC: Recent Labs  Lab 01/25/20 1133 01/25/20 1206 01/25/20 1214 01/25/20 1725 01/26/20 0106  WBC 11.1* 12.7*  --  10.4 11.4*  NEUTROABS  --  9.5*  --   --   --   HGB 15.2 15.8 16.3 13.9 13.6  HCT 47.0 49.7 48.0 43.6 41.5  MCV 91.1 92.2  --  92.6 90.6  PLT 306 298  --  244 225   BMP &GFR Recent Labs  Lab 01/25/20 1133 01/25/20 1214 01/25/20 1725 01/26/20 0106  NA 138 139  --  140  K 3.5 3.7  --  3.3*  CL 103 105  --  108  CO2 22  --   --  23  GLUCOSE 137* 147*  --  102*  BUN 14 16  --  11  CREATININE 1.41* 1.50* 1.24 1.19  CALCIUM 9.5  --   --  8.5*  MG  --   --  1.9 1.9  PHOS  --   --  3.4  --    Estimated Creatinine Clearance: 54.3 mL/min (by C-G formula based on SCr of 1.19 mg/dL). Liver & Pancreas: Recent Labs  Lab 01/25/20 1133  AST 20  ALT 14  ALKPHOS 59  BILITOT 1.1  PROT 7.4  ALBUMIN 4.2   Recent Labs  Lab 01/25/20 1206  LIPASE 19   No results for input(s): AMMONIA in the last 168 hours. Diabetic: No results for input(s): HGBA1C in the last 72 hours. Recent Labs  Lab 01/26/20 0807 01/26/20 1151  GLUCAP 91 86   Cardiac Enzymes: No results for input(s): CKTOTAL, CKMB, CKMBINDEX, TROPONINI in the last 168  hours. No results for input(s): PROBNP in the last 8760 hours. Coagulation Profile: No results for input(s): INR, PROTIME in the last 168 hours. Thyroid Function Tests: Recent Labs    01/25/20 1730  TSH 3.602   Lipid Profile: No results for input(s): CHOL, HDL, LDLCALC, TRIG, CHOLHDL, LDLDIRECT in the last 72 hours. Anemia Panel: No results for input(s): VITAMINB12, FOLATE, FERRITIN, TIBC, IRON, RETICCTPCT in the last 72 hours. Urine analysis:    Component Value Date/Time   COLORURINE YELLOW 01/25/2020 2256   APPEARANCEUR HAZY (A) 01/25/2020  2256   LABSPEC 1.020 01/25/2020 2256   PHURINE 5.0 01/25/2020 2256   GLUCOSEU NEGATIVE 01/25/2020 2256   HGBUR NEGATIVE 01/25/2020 2256   BILIRUBINUR NEGATIVE 01/25/2020 2256   KETONESUR 5 (A) 01/25/2020 2256   PROTEINUR 30 (A) 01/25/2020 2256   UROBILINOGEN 0.2 01/19/2008 1246   NITRITE NEGATIVE 01/25/2020 2256   LEUKOCYTESUR NEGATIVE 01/25/2020 2256   Sepsis Labs: Invalid input(s): PROCALCITONIN, [place]  Microbiology: Recent Results (from the past 240 hour(s))  SARS CORONAVIRUS 2 (TAT 6-24 HRS) Nasopharyngeal Nasopharyngeal Swab     Status: None   Collection Time: 01/25/20  4:25 PM   Specimen: Nasopharyngeal Swab  Result Value Ref Range Status   SARS Coronavirus 2 NEGATIVE NEGATIVE Final    Comment: (NOTE) SARS-CoV-2 target nucleic acids are NOT DETECTED. The SARS-CoV-2 RNA is generally detectable in upper and lower respiratory specimens during the acute phase of infection. Negative results do not preclude SARS-CoV-2 infection, do not rule out co-infections with other pathogens, and should not be used as the sole basis for treatment or other patient management decisions. Negative results must be combined with clinical observations, patient history, and epidemiological information. The expected result is Negative. Fact Sheet for Patients: SugarRoll.be Fact Sheet for Healthcare  Providers: https://www.woods-mathews.com/ This test is not yet approved or cleared by the [place] FDA and  has been authorized for detection and/or diagnosis of SARS-CoV-2 by FDA under an Emergency Use Authorization (EUA). This EUA will remain  in effect (meaning this test can be used) for the duration of the COVID-19 declaration under Section 56 4(b)(1) of the Act, 21 U.S.C. section 360bbb-3(b)(1), unless the authorization is terminated or revoked sooner. Performed at [facility], [place] [address] [place], [place] [address]     Radiology Studies: CT ABDOMEN PELVIS WO CONTRAST  Result Date: 01/25/2020 CLINICAL DATA:  Nausea and vomiting. EXAM: CT ABDOMEN AND PELVIS WITHOUT CONTRAST TECHNIQUE: Multidetector CT imaging of the abdomen and pelvis was performed following the standard protocol without IV contrast. COMPARISON:  CT scan dated 10/05/2019 FINDINGS: Lower chest: No acute abnormalities. Hepatobiliary: No focal liver abnormality is seen. Status post cholecystectomy. No biliary dilatation. Pancreas: There are few chronic calcifications in the uncinate process of the pancreas, stable. The pancreas is otherwise normal. Spleen: Normal in size without focal abnormality. Adrenals/Urinary Tract: Adrenal glands are normal. Kidneys are normal except for a benign-appearing 5.3 cm cyst on the lower pole of the right kidney. Left kidney is normal. No hydronephrosis. Bladder is normal. Stomach/Bowel: Stomach is within normal limits. Appendix appears normal. No evidence of bowel wall thickening, distention, or inflammatory changes. The cecum lies in the right mid abdomen as does the appendix. Vascular/Lymphatic: Aortic atherosclerosis. No enlarged abdominal or pelvic lymph nodes. Reproductive: Prostate is unremarkable. Other: No abdominal wall hernia or abnormality. No abdominopelvic ascites. Musculoskeletal: No acute abnormality. Severe degenerative disc disease in the lower lumbar  spine. IMPRESSION: Benign-appearing abdomen and pelvis. Aortic Atherosclerosis (ICD10-I70.0). Electronically Signed   By: Lorriane Shire M.D.   On: 01/25/2020 15:34   ECHOCARDIOGRAM COMPLETE  Result Date: 01/26/2020    ECHOCARDIOGRAM REPORT   Patient Name:   Johnny [name] Date of Exam: 01/26/2020 Medical Rec #:  [id number]   Height:       72.0 in Accession #:    [id number]  Weight:       197.1 lb Date of Birth:  [birth year]   BSA:          2.117 m Patient Age:  78 years    BP:           92/63 mmHg Patient Gender: M           HR:           71 bpm. Exam Location:  Inpatient Procedure: 2D Echo, Cardiac Doppler and Color Doppler Indications:    Atrial fibrillation  History:        Patient has prior history of Echocardiogram examinations, most                 recent 02/27/2019. CAD, Arrythmias:PSVT; Risk Factors:Diabetes and                 Hypertension.  Sonographer:    Dustin Flock Referring Phys: [id number] Mckinley Jewel  Sonographer Comments: Image acquisition challenging due to respiratory motion. IMPRESSIONS  1. Low normal LV systolic function; mild LVH; grade 1 diastolic dysfunction.  2. Left ventricular ejection fraction, by estimation, is 50 to 55%. The left ventricle has low normal function. The left ventricle has no regional wall motion abnormalities. There is mild left ventricular hypertrophy. Left ventricular diastolic parameters are consistent with Grade I diastolic dysfunction (impaired relaxation).  3. Right ventricular systolic function is normal. The right ventricular size is normal. There is normal pulmonary artery systolic pressure.  4. The mitral valve is normal in structure. No evidence of mitral valve regurgitation. No evidence of mitral stenosis.  5. The aortic valve is tricuspid. Aortic valve regurgitation is not visualized. Mild aortic valve sclerosis is present, with no evidence of aortic valve stenosis.  6. The inferior vena cava is normal in size with greater than 50% respiratory  variability, suggesting right atrial pressure of 3 mmHg. FINDINGS  Left Ventricle: Left ventricular ejection fraction, by estimation, is 50 to 55%. The left ventricle has low normal function. The left ventricle has no regional wall motion abnormalities. The left ventricular internal cavity size was normal in size. There is mild left ventricular hypertrophy. Left ventricular diastolic parameters are consistent with Grade I diastolic dysfunction (impaired relaxation). Right Ventricle: The right ventricular size is normal.Right ventricular systolic function is normal. There is normal pulmonary artery systolic pressure. The tricuspid regurgitant velocity is 1.75 m/s, and with an assumed right atrial pressure of 3 mmHg, the estimated right ventricular systolic pressure is [id number] mmHg. Left Atrium: Left atrial size was normal in size. Right Atrium: Right atrial size was normal in size. Pericardium: There is no evidence of pericardial effusion. Mitral Valve: The mitral valve is normal in structure. Normal mobility of the mitral valve leaflets. No evidence of mitral valve regurgitation. No evidence of mitral valve stenosis. Tricuspid Valve: The tricuspid valve is normal in structure. Tricuspid valve regurgitation is trivial. No evidence of tricuspid stenosis. Aortic Valve: The aortic valve is tricuspid. Aortic valve regurgitation is not visualized. Mild aortic valve sclerosis is present, with no evidence of aortic valve stenosis. Pulmonic Valve: The pulmonic valve was not well visualized. Pulmonic valve regurgitation is not visualized. No evidence of pulmonic stenosis. Aorta: The aortic root is normal in size and structure. Venous: The inferior vena cava is normal in size with greater than 50% respiratory variability, suggesting right atrial pressure of 3 mmHg.  Additional Comments: Low normal LV systolic function; mild LVH; grade 1 diastolic dysfunction.  LEFT VENTRICLE PLAX 2D LVIDd:         4.70 cm  Diastology LVIDs:          3.70 cm  LV e' lateral:  4.90 cm/s LV PW:         1.20 cm  LV E/e' lateral: 13.6 LV IVS:        1.20 cm  LV e' medial:    5.77 cm/s LVOT diam:     2.30 cm  LV E/e' medial:  11.5 LV SV:         72 LV SV Index:   34 LVOT Area:     4.15 cm  RIGHT VENTRICLE RV Basal diam:  2.70 cm RV S prime:     8.81 cm/s TAPSE (M-mode): 2.5 cm LEFT ATRIUM             Index       RIGHT ATRIUM           Index LA diam:        3.40 cm 1.61 cm/m  RA Area:     11.70 cm LA Vol (A2C):   41.8 ml 19.74 ml/m RA Volume:   27.60 ml  13.04 ml/m LA Vol (A4C):   53.3 ml 25.17 ml/m LA Biplane Vol: 51.3 ml 24.23 ml/m  AORTIC VALVE LVOT Vmax:   80.90 cm/s LVOT Vmean:  45.800 cm/s LVOT VTI:    0.174 m  AORTA Ao Root diam: 3.60 cm MITRAL VALVE               TRICUSPID VALVE MV Area (PHT): 5.13 cm    TR Peak grad:   12.2 mmHg MV Decel Time: 148 msec    TR Vmax:        175.00 cm/s MV E velocity: 66.40 cm/s                            SHUNTS                            Systemic VTI:  0.17 m                            Systemic Diam: 2.30 cm Kirk Ruths MD Electronically signed by Kirk Ruths MD Signature Date/Time: 01/26/2020/12:39:33 PM    Final      Thania Woodlief T. [place]  If 7PM-7AM, please contact night-coverage [email or web address] Password [facility] 01/26/2020, 2:36 PM

## 2020-01-26 NOTE — Evaluation (Signed)
Occupational Therapy Evaluation Patient Details Name: Johnny [name] MRN: [id number] DOB: 1941/02/14 Today's Date: 01/26/2020    History of Present Illness 79 y.o. male with medical history significant of hypertension, hypothyroidism, coronary artery disease, GERD presents to emergency department due to vomiting and dehydration. In ED found to have acute SVT the a-fib, worsening kidney function, afebrile w/ leukocytosis and bibasailar atelectasis. Pt admitted w/ a-fib w/ RVR sec to dehydration.   Clinical Impression   PTA patient uses rollator for mobility, requires assist for LB ADLs and IADLs from caregiver.  He was admitted for above and is limited by problem list below, including impaired balance, decreased activity tolerance, generalized weakness, and decreased safety awareness.  Patient requires re-orientation to exact date, but able to come close saying "its near march 31st", follows commands with increased time, requires cueing for safety and problem solving (rollator safety, although uses this for all mobility); recommend further cognitive assessment. Patient currently requires min guard for bed mobility, min assist for transfers from elevated EOB, min assist-max assist for ADLs. Requires mod assist to correct LOB at sink when turning, and reports "see that's my problem" and voices difficulty with balance even PTA.  He will benefit from continued OT services while admitted and after dc recommend continued HHOT services and 24/7 support after dc home in order to optimize independence, safety with ADLs, mobility.     Follow Up Recommendations  Home health OT;Supervision/Assistance - 24 hour([place] need SNF if unable to get 24/7 support)    Equipment Recommendations  3 in 1 bedside commode    Recommendations for Other Services       Precautions / Restrictions Precautions Precautions: Fall Restrictions Weight Bearing Restrictions: No      Mobility Bed Mobility Overal bed mobility: Needs  Assistance Bed Mobility: Supine to Sit;Sit to Supine     Supine to sit: Min guard;HOB elevated Sit to supine: Min guard   General bed mobility comments: min guard with increased time to transition to/from EOB  Transfers Overall transfer level: Needs assistance Equipment used: 4-wheeled walker Transfers: Sit to/from Stand Sit to Stand: Min assist;From elevated surface Stand pivot transfers: Min assist       General transfer comment: cueing for hand placement and technique from elevated EOB with min assist to power up and steady; cueing for rollator safety with locking brakes     Balance Overall balance assessment: History of Falls;Needs assistance Sitting-balance support: Feet supported Sitting balance-Leahy Scale: Fair     Standing balance support: Bilateral upper extremity supported;During functional activity;No upper extremity supported Standing balance-Leahy Scale: Poor Standing balance comment: able to stand grooming with min assist for balance, relaint on BUE support dynamically with loss of balance at sink requiring mod assist to correct                           ADL either performed or assessed with clinical judgement   ADL Overall ADL's : Needs assistance/impaired     Grooming: Minimal assistance;Standing Grooming Details (indicate cue type and reason): oral care, min assist for balance Upper Body Bathing: Sitting;Minimal assistance   Lower Body Bathing: Moderate assistance;Sit to/from stand   Upper Body Dressing : Minimal assistance;Sitting   Lower Body Dressing: Maximal assistance;Sit to/from stand   Toilet Transfer: Minimal assistance;Ambulation Toilet Transfer Details (indicate cue type and reason): simulated in room         Functional mobility during ADLs: Minimal assistance;Cueing for safety;Cueing for sequencing(rollator )  General ADL Comments: pt limited by decreased safety awareness, impaired balance, generalized weakness; requires  cueing for rollator safety throughout session     Vision         Perception     Praxis      Pertinent Vitals/Pain Pain Assessment: Faces Faces Pain Scale: Hurts a little bit Pain Location: generalized with mobility Pain Descriptors / Indicators: Discomfort Pain Intervention(s): Monitored during session;Repositioned;Limited activity within patient's tolerance     Hand Dominance     Extremity/Trunk Assessment Upper Extremity Assessment Upper Extremity Assessment: Generalized weakness   Lower Extremity Assessment Lower Extremity Assessment: Defer to PT evaluation   Cervical / Trunk Assessment Cervical / Trunk Assessment: Kyphotic   Communication Communication Communication: No difficulties   Cognition Arousal/Alertness: Awake/alert Behavior During Therapy: WFL for tasks assessed/performed Overall Cognitive Status: No family/caregiver present to determine baseline cognitive functioning Area of Impairment: Safety/judgement;Problem solving                         Safety/Judgement: Decreased awareness of deficits;Decreased awareness of safety   Problem Solving: Requires verbal cues General Comments: pt with some STM deficits, reports date is "near March 31st, because thats my brothers birthday", but poor awareness of safety    General Comments  VSS during session, on RA     Exercises     Shoulder Instructions      Home Living Family/patient expects to be discharged to:: Private residence Living Arrangements: Alone Available Help at Discharge: Family;Personal care attendant;Available PRN/intermittently(9-5 daily) Type of Home: House Home Access: Ramped entrance     Home Layout: One level     Bathroom Shower/Tub: Occupational psychologist: Handicapped height Bathroom Accessibility: No   Home Equipment: Environmental consultant - 2 wheels;Bedside commode;Cane - single point;Shower seat;Grab bars - toilet;Grab bars - tub/shower   Additional Comments: reports  rollator not fitting in bathroom, uses grabbars in bathroom only      Prior Functioning/Environment Level of Independence: Needs assistance  Gait / Transfers Assistance Needed: states ambulates with FWW in home ADL's / Homemaking Assistance Needed: initially reports independent with ADLs, but then reports his caregiver assists with LB ADLs as needed; reports still showers alone  Communication / Swallowing Assistance Needed: no difficulty          OT Problem List: Decreased strength;Decreased activity tolerance;Impaired balance (sitting and/or standing);Decreased cognition;Decreased safety awareness;Decreased knowledge of use of DME or AE;Decreased knowledge of precautions;Obesity      OT Treatment/Interventions: Self-care/ADL training;Energy conservation;DME and/or AE instruction;Therapeutic activities;Cognitive remediation/compensation;Patient/family education;Balance training    OT Goals(Current goals can be found in the care plan section) Acute Rehab OT Goals Patient Stated Goal: wants to return home, states he is more independent there OT Goal Formulation: With patient Time For Goal Achievement: 02/09/20 Potential to Achieve Goals: Good  OT Frequency: Min 2X/week   Barriers to D/C: Decreased caregiver support          Co-evaluation              AM-PAC OT "6 Clicks" Daily Activity     Outcome Measure Help from another person eating meals?: None Help from another person taking care of personal grooming?: A Little Help from another person toileting, which includes using toliet, bedpan, or urinal?: A Lot Help from another person bathing (including washing, rinsing, drying)?: A Lot Help from another person to put on and taking off regular upper body clothing?: A Little Help from another person to put on  and taking off regular lower body clothing?: A Lot 6 Click Score: 16   End of Session Equipment Utilized During Treatment: Gait belt;Other (comment)(rollator) Nurse  Communication: Mobility status  Activity Tolerance: Patient tolerated treatment well Patient left: in bed;with call bell/phone within reach;with bed alarm set;with SCD's reapplied  OT Visit Diagnosis: Other abnormalities of gait and mobility (R26.89);Muscle weakness (generalized) (M62.81);History of falling (Z91.81)                Time: [id number] OT Time Calculation (min): 32 min Charges:  OT General Charges $OT Visit: 1 Visit OT Evaluation $OT Eval Moderate Complexity: 1 Mod OT Treatments $Self Care/Home Management : 8-22 mins  Jolaine Artist, OT Acute Rehabilitation Services Pager [phone] Office [phone]   Delight Stare 01/26/2020, 3:17 PM

## 2020-01-26 NOTE — TOC Initial Note (Signed)
Transition of Care [facility]) - Initial/Assessment Note    Patient Details  Name: Johnny [name] MRN: [id number] Date of Birth: 15-Nov-1940  Transition of Care [facility]) CM/SW Contact:    Bethena Roys, RN Phone Number: 01/26/2020, 4:22 PM  Clinical Narrative: Patient presented for vomiting- found to be in Atrial Fib. Prior to arrival patient was from home with caregiver. Caregiver is in the home from 9 am-5 pm Monday-Friday. Patient uses [facility] and gets medications without any problems. Patient has primary care provider and gets to all appointments either by caregiver or daughter. Case Manager received verbal permission to call the daughter to discuss Physical and Occupational Therapy recommendations. Patient has used home health services in the past and he feels that the services are not beneficial. He states that the caregiver can assist with exercises that he had to go by before. Daughter is in agreement with the patient. Daughter will increase the hours for the personal caregiver to provide 24/7 supervision. Patient has durable medical equipment (DME): Kasandra Knudsen, [place], Rollator, Wheelchair and 3n1. No DME needs identified at this time. No home health services to be set up. Case Manager to continue to follow for additional transition of care needs.    Expected Discharge Plan: Home/Self Care Barriers to Discharge: No Barriers Identified   Patient Goals and CMS Choice Patient states their goals for this hospitalization and ongoing recovery are:: "to return home"   Choice offered to / list presented to : NA  Expected Discharge Plan and Services Expected Discharge Plan: Home/Self Care In-house Referral: NA Discharge Planning Services: CM Consult Post Acute Care Choice: NA Living arrangements for the past 2 months: Single Family Home                 DME Arranged: N/A         HH Arranged: Refused [place] Agency: NA        Prior Living Arrangements/Services Living arrangements for  the past 2 months: Single Family Home Lives with:: Self(Has support of daughter and caregiver.) Patient language and need for interpreter reviewed:: Yes Do you feel safe going back to the place where you live?: Yes      Need for Family Participation in Patient Care: Yes (Comment) Care giver support system in place?: Yes (comment)   Criminal Activity/Legal Involvement Pertinent to Current Situation/Hospitalization: No - Comment as needed  Activities of Daily Living Home Assistive Devices/Equipment: Dentures (specify type), Walker (specify type) ADL Screening (condition at time of admission) Patient's cognitive ability adequate to safely complete daily activities?: Yes Is the patient deaf or have difficulty hearing?: Yes Does the patient have difficulty seeing, even when wearing glasses/contacts?: Yes Does the patient have difficulty concentrating, remembering, or making decisions?: No Patient able to express need for assistance with ADLs?: Yes Does the patient have difficulty dressing or bathing?: No Independently performs ADLs?: Yes (appropriate for developmental age) Does the patient have difficulty walking or climbing stairs?: Yes Weakness of Legs: Left Weakness of Arms/Hands: None  Permission Sought/Granted Permission sought to share information with : Family Supports                Emotional Assessment Appearance:: Appears stated age Attitude/Demeanor/Rapport: Engaged Affect (typically observed): Appropriate Orientation: : Oriented to  Time, Oriented to Situation, Oriented to Place, Oriented to Self Alcohol / Substance Use: Not Applicable Psych Involvement: No (comment)  Admission diagnosis:  Atrial fibrillation with rapid ventricular response ([place]) [I48.91] Atrial fibrillation with RVR ([place]) [I48.91] Acute dehydration [id number]  Patient Active Problem List   Diagnosis Date Noted  . Atrial fibrillation with rapid ventricular response ([place]) 01/25/2020  . AKI (acute kidney  injury) ([place])   . Acute metabolic encephalopathy [id number]  . Acute on chronic cholecystitis s/p lap cholecystectomy 03/23/2019 03/23/2019  . Chronic anticoagulation 03/23/2019  . Chronic cholecystitis 03/23/2019  . Unstable angina ([place]) 11/05/2018  . Tachycardia 10/29/2018  . Pain in left hip 08/13/2018  . Trochanteric bursitis of left hip 06/01/2018  . Hypothyroidism 11/04/2011  . SVT (supraventricular tachycardia) ([place]) 04/10/2011  . HTN (hypertension) 04/10/2011  . Dyslipidemia 04/10/2011  . Coronary artery disease 11/25/2010  . GERD 11/25/2010  . CONSTIPATION 11/25/2010  . CHEST PAIN UNSPECIFIED 11/25/2010  . PERSONAL HISTORY OF COLONIC POLYPS 11/25/2010   PCP:  Burnard Bunting, MD Pharmacy:   [id number] [place], [address] [place] [address] Phone: [phone] Fax: [phone]   Readmission Risk Interventions No flowsheet data found.

## 2020-01-26 NOTE — Discharge Instructions (Signed)
Information on my medicine - ELIQUIS® (apixaban) ° °Why was Eliquis® prescribed for you? °Eliquis® was prescribed for you to reduce the risk of a blood clot forming that can cause a stroke if you have a medical condition called atrial fibrillation (a type of irregular heartbeat). ° °What do You need to know about Eliquis® ? °Take your Eliquis® TWICE DAILY - one tablet in the morning and one tablet in the evening with or without food. If you have difficulty swallowing the tablet whole please discuss with your pharmacist how to take the medication safely. ° °Take Eliquis® exactly as prescribed by your doctor and DO NOT stop taking Eliquis® without talking to the doctor who prescribed the medication.  Stopping B[place]e increase your risk of developing a stroke.  Refill your prescription before you run out. ° °After discharge, you should have regular check-up appointments with your healthcare provider that is prescribing your Eliquis®.  In the future your dose Loe need to be changed if your kidney function or weight changes by a significant amount or as you get older. ° °What do you do if you miss a dose? °If you miss a dose, take it as soon as you remember on the same day and resume taking twice daily.  Do not take more than one dose of ELIQUIS at the same time to make up a missed dose. ° °Important Safety Information °A possible side effect of Eliquis® is bleeding. You should call your healthcare provider right away if you experience any of the following: °? Bleeding from an injury or your nose that does not stop. °? Unusual colored urine (red or dark brown) or unusual colored stools (red or black). °? Unusual bruising for unknown reasons. °? A serious fall or if you hit your head (even if there is no bleeding). ° °Some medicines Ilic interact with Eliquis® and might increase your risk of bleeding or clotting while on Eliquis®. To help avoid this, consult your healthcare provider or pharmacist prior to using any new  prescription or non-prescription medications, including herbals, vitamins, non-steroidal anti-inflammatory drugs (NSAIDs) and supplements. ° °This website has more information on Eliquis® (apixaban): http://www.eliquis.com/eliquis/home ° °

## 2020-01-26 NOTE — Evaluation (Signed)
Physical Therapy Evaluation Patient Details Name: Johnny [name] MRN: [id number] DOB: 19-Nov-1940 Today's Date: 01/26/2020   History of Present Illness  79 y.o. male with medical history significant of hypertension, hypothyroidism, coronary artery disease, GERD presents to emergency department due to vomiting and dehydration. In ED found to have acute SVT the a-fib, worsening kidney function, afebrile w/ leukocytosis and bibasailar atelectasis. Pt admitted w/ a-fib w/ RVR sec to dehydration.  Clinical Impression   Pt admitted with above dx and above hx. Was living home alone, has caregiver he states from 9-5 who helps with mostly everything as needed. Pt reports 3-4 falls this year alone, states he gets dizzy sometimes and just falls, also reports some injuries from this. This am pt is needing mod a with most mobility, he was not able to get out of bed w/o mod a, sat edge of bed unsupported with fair trunk control but fatigues quickly needing encouragement to continue to next task, sit<>stand with mod a also, pt needed max cues to complete, he reports unable to push from bed sec to B shoulder injuries but unable to pull himself up otherwise (even though this is very unsafe) sec to weakness. Pt lost balance backwards multiple times through transfers and also with ambulation needing mod a to correct and not fall. Pt ambulated approx 120ft with fWW and mod a, VSS on monitor. Pt will greatly benefit from continued PT tx while in hospital to address above mentioned deficits in strength, balance and coordination, independence, safety and activity tolerance. He has a caregiver 9-5 but is home alone 5-9, when asked when his prior falls where, ie when caregiver is there or not pt was not able to give an answer. He denies need for placement or even attempting rehabilitation stating he can manage at home. He will need supervision for all out of bed activities and he is losing his balance backwards and has hx of multi falls  already. If this is not able to provided by family and caregiver then SNF/ALF placement [place] be needed.     Follow Up Recommendations Supervision for mobility/OOB([place] need SNF)    Equipment Recommendations  None recommended by PT    Recommendations for Other Services       Precautions / Restrictions Precautions Precautions: Fall Restrictions Weight Bearing Restrictions: No      Mobility  Bed Mobility Overal bed mobility: Needs Assistance Bed Mobility: Supine to Sit;Sit to Supine     Supine to sit: Mod assist Sit to supine: Min assist   General bed mobility comments: needs a to pull trunk to sit (offers hand and says pull)  Transfers Overall transfer level: Needs assistance Equipment used: 4-wheeled walker Transfers: Sit to/from [facility] Sit to Stand: Mod assist Stand pivot transfers: Min assist       General transfer comment: needs cues for walker safety and also sequencing, argues about cues also states all therapists that have come into his home liked to argue with him. Multiple loses of balance needing therapist mod a to prevent fall backwards  Ambulation/Gait Ambulation/Gait assistance: Mod assist Gait Distance (Feet): 120 Feet Assistive device: 4-wheeled walker Gait Pattern/deviations: Step-through pattern;Wide base of support;Ataxic;Shuffle Gait velocity: dec   General Gait Details: multiple loses of balance with ambulation, needed mod a to prevent fall.   Stairs            Wheelchair Mobility    Modified Rankin (Stroke Patients Only)       Balance Overall balance assessment:  History of Falls;Needs assistance Sitting-balance support: Feet supported Sitting balance-Leahy Scale: Fair     Standing balance support: During functional activity;Bilateral upper extremity supported Standing balance-Leahy Scale: Poor Standing balance comment: needs mod a to maintain and not fall backwards                              Pertinent Vitals/Pain Pain Assessment: Faces Faces Pain Scale: Hurts little more Pain Location: w/ some mobility Pain Intervention(s): Limited activity within patient's tolerance    Home Living Family/patient expects to be discharged to:: Private residence Living Arrangements: Alone Available Help at Discharge: Family;Personal care attendant Type of Home: House Home Access: Ramped entrance     Home Layout: One level Home Equipment: Environmental consultant - 2 wheels;Bedside commode;Cane - single point;Shower seat Additional Comments: spouse has passed away pt had caregiver 9-5 daily    Prior Function Level of Independence: Needs assistance   Gait / Transfers Assistance Needed: states ambulates with FWW in home  ADL's / Homemaking Assistance Needed: care giver assist with ADLs as needed        Hand Dominance        Extremity/Trunk Assessment   Upper Extremity Assessment Upper Extremity Assessment: Generalized weakness    Lower Extremity Assessment Lower Extremity Assessment: Generalized weakness    Cervical / Trunk Assessment Cervical / Trunk Assessment: Kyphotic  Communication   Communication: No difficulties  Cognition Arousal/Alertness: Awake/alert Behavior During Therapy: WFL for tasks assessed/performed Overall Cognitive Status: No family/caregiver present to determine baseline cognitive functioning                                 General Comments: seems to be sharp and close to baseline but safety deficits persist      General Comments      Exercises     Assessment/Plan    PT Assessment Patient needs continued PT services  PT Problem List Decreased strength;Decreased activity tolerance;Decreased balance;Decreased mobility;Decreased coordination;Decreased knowledge of use of DME;Decreased safety awareness;Decreased cognition;Decreased knowledge of precautions       PT Treatment Interventions DME instruction;Gait training;Functional mobility  training;Therapeutic activities;Therapeutic exercise;Balance training;Neuromuscular re-education;Cognitive remediation;Patient/family education    PT Goals (Current goals can be found in the Care Plan section)  Acute Rehab PT Goals Patient Stated Goal: wants to return home, states he is more independent there PT Goal Formulation: With patient Time For Goal Achievement: 02/09/20 Potential to Achieve Goals: Fair    Frequency Min 3X/week   Barriers to discharge Other (comment) is home alone 5-9    Co-evaluation               AM-PAC PT "6 Clicks" Mobility  Outcome Measure Help needed turning from your back to your side while in a flat bed without using bedrails?: A Little Help needed moving from lying on your back to sitting on the side of a flat bed without using bedrails?: A Lot Help needed moving to and from a bed to a chair (including a wheelchair)?: A Lot Help needed standing up from a chair using your arms (e.g., wheelchair or bedside chair)?: A Lot Help needed to walk in hospital room?: A Lot Help needed climbing 3-5 steps with a railing? : A Lot 6 Click Score: 13    End of Session Equipment Utilized During Treatment: Gait belt Activity Tolerance: Patient limited by fatigue;Patient limited by lethargy;Treatment limited secondary to  medical complications (Comment) Patient left: in bed;with call bell/phone within reach;with bed alarm set Nurse Communication: Mobility status PT Visit Diagnosis: Repeated falls (R29.6);Other abnormalities of gait and mobility (R26.89);Unsteadiness on feet (R26.81);Muscle weakness (generalized) (M62.81)    Time: [id number] PT Time Calculation (min) (ACUTE ONLY): 43 min   Charges:   PT Evaluation $PT Eval Moderate Complexity: 1 Mod PT Treatments $Gait Training: 8-22 mins $Therapeutic Activity: 8-22 mins        Horald Chestnut, PT   Delford Field 01/26/2020, 2:01 PM

## 2020-01-27 DIAGNOSIS — K219 Gastro-esophageal reflux disease without esophagitis: Secondary | ICD-10-CM

## 2020-01-27 LAB — RENAL FUNCTION PANEL
Albumin: 3.2 g/dL — ABNORMAL LOW (ref 3.5–5.0)
Anion gap: 9 (ref 5–15)
BUN: 8 mg/dL (ref 8–23)
CO2: 21 mmol/L — ABNORMAL LOW (ref 22–32)
Calcium: 8.5 mg/dL — ABNORMAL LOW (ref 8.9–10.3)
Chloride: 107 mmol/L (ref 98–111)
Creatinine, Ser: 1.12 mg/dL (ref 0.61–1.24)
GFR calc Af Amer: >60 mL/min (ref >60)
GFR calc non Af Amer: >60 mL/min (ref >60)
Glucose, Bld: 91 mg/dL (ref 70–99)
Phosphorus: 2.7 mg/dL (ref 2.5–4.6)
Potassium: 3.7 mmol/L (ref 3.5–5.1)
Sodium: 137 mmol/L (ref 135–145)

## 2020-01-27 LAB — MAGNESIUM: Magnesium: 1.7 mg/dL (ref 1.7–2.4)

## 2020-01-27 MED ORDER — AMIODARONE HCL 200 MG PO TABS: 200.0000 mg | ORAL_TABLET | Freq: Every day | ORAL | Status: DC

## 2020-01-27 MED ORDER — MAGNESIUM SULFATE 2 GM/50ML IV SOLN
2.0000 g | Freq: Once | INTRAVENOUS | Status: DC
  Filled 2020-01-27: qty 50

## 2020-01-27 MED ORDER — ALBUTEROL SULFATE HFA 108 (90 BASE) MCG/ACT IN AERS: 2.0000 | INHALATION_SPRAY | Freq: Four times a day (QID) | RESPIRATORY_TRACT | 1 refills | Status: AC | PRN

## 2020-01-27 MED ORDER — AMIODARONE HCL 200 MG PO TABS: ORAL_TABLET | ORAL | 0 refills | Status: DC

## 2020-01-27 MED ORDER — APIXABAN 5 MG PO TABS: 5.0000 mg | ORAL_TABLET | Freq: Two times a day (BID) | ORAL | 1 refills | Status: DC

## 2020-01-27 MED ORDER — POTASSIUM CHLORIDE CRYS ER 20 MEQ PO TBCR
40.0000 meq | EXTENDED_RELEASE_TABLET | Freq: Once | ORAL | Status: AC
  Administered 2020-01-27: 10:00:00 40 meq via ORAL
  Filled 2020-01-27: qty 2

## 2020-01-27 MED ORDER — AMIODARONE HCL 200 MG PO TABS
400.0000 mg | ORAL_TABLET | Freq: Two times a day (BID) | ORAL | Status: DC
  Administered 2020-01-27: 10:00:00 400 mg via ORAL
  Filled 2020-01-27: qty 2

## 2020-01-27 MED ORDER — METOPROLOL TARTRATE 50 MG PO TABS: 50.0000 mg | ORAL_TABLET | Freq: Two times a day (BID) | ORAL | 1 refills | Status: DC

## 2020-01-27 MED FILL — AMIODARONE HCL 200 MG TAB: 200 | 28 days supply | Qty: 49 | Fill #0

## 2020-01-27 MED FILL — ELIQUIS 5 MG TABLET: 5 | 30 days supply | Qty: 60 | Fill #0

## 2020-01-27 MED FILL — ALBUTEROL SULFATE HFA 108 (: 108 (90 BAS | 25 days supply | Qty: 18 | Fill #0

## 2020-01-27 NOTE — Discharge Summary (Signed)
Physician Discharge Summary  Osby M Dupler [id number] DOB: 01-Apr-1941 DOA: 01/25/2020  PCP: Burnard Bunting, MD  Admit date: 01/25/2020 Discharge date: 01/27/2020  Admitted From: Home Disposition: Home  Recommendations for Outpatient Follow-up:  1. Follow ups as below. 2. Please obtain CBC/BMP/Mag at follow up 3. Please follow up on the following pending results: None  Home Health: PT/OT/RN/aide Equipment/Devices: None  Discharge Condition: Stable CODE STATUS: Full code  Follow-up Information    Burnard Bunting, MD. Schedule an appointment as soon as possible for a visit in 1 week(s).   Specialty: Internal Medicine Contact information: [place] [address] [phone]        Martinique, Peter M, MD. Schedule an appointment as soon as possible for a visit in 2 week(s).   Specialty: Cardiology Contact information: [address] [place] [address] [phone]            Hospital Course: 79 year old male with history of CAD s/p stent to proximal LAD in 2005 on chronic DAPT with last cath showing nonobstructive CAD in January [id number], diastolic CHF, PSVT, HTN, GERD and hypothyroidism brought to ED by his [facility] due to emesis and dehydration after barbecue 2 days earlier, and admitted with new onset atrial fibrillation, dehydration and AKI.  In ED, initially in SVT but change into A. fib with RVR.  WBC 12.7.  Creatinine 1.41.  Lipase normal.  LA 2.4 > 1.4.  CT abdomen and pelvis without acute finding.  CXR with right basilar atelectasis.  COVID-19 negative.  Received IV fluid boluses, Zofran and Cardizem, and admitted for new onset A. fib with RVR, dehydration and AKI.   Cardiology consulted and started on IV amiodarone and transitioned to p.o. amiodarone.  He was also started on Eliquis for anticoagulation.  He was hydrated with IV fluids for dehydration.  On the day of discharge, he felt well and wanted to go home.  He was discharged on  amiodarone 400 mg twice daily for 7 days followed by amiodarone 200 mg daily for 21 days.  He is also on metoprolol 50 mg twice daily.  Patient was evaluated by PT/OT who recommended SNF. However, he refused SNF and discharged home with home health.   See individual problem list below for more on hospital course.  Discharge Diagnoses:  New onset A. fib with RVR-back in NSR.  CHA2DS2-VASc score 5 (age, CAD CHF and HTN).  TSH normal.  -Discharged on amiodarone 400 mg twice daily for 1 week followed by 200 mg daily for 3 weeks. -Also continued home metoprolol at 50 mg twice daily -Eliquis for anticoagulation.   Emesis/dehydration: work-up including CMP, lipase and CT abdomen and pelvis not revealing.    Resolved.  AKI on CKD-3a: Baseline Cr ~1.15>> 1.41 (admit)> 1.5> 1.12  Hypokalemia: Resolved.  Lactic acidosis/leukocytosis/bandemia: Likely due to dehydration.  Resolved.  CAD s/p stent to proximal LAD in 2005 with cath in January 2020 showing nonobstructive CAD.  No anginal symptoms. -Eliquis, aspirin and a statin.  Discontinued Plavix. -Resume metoprolol tartrate at 50 mg twice daily.  Essential hypertension: Normotensive. -Resume home lisinopril, metoprolol and Lasix.  Chronic diastolic CHF: Echo with EF of 50 to 55% and G1 DD.  Patient was dehydrated due to emesis on presentation.  Hydrated with IV fluid.  -Resume Lasix on discharge.  GERD: -Continue PPI  Depression: Stable. -Continue Cymbalta  Debility/physical deconditioning: Patient lives alone. -PT/OT/RN/aide  DNR/DNI    Discharge Instructions  Discharge Instructions    (HEART FAILURE  PATIENTS) Call MD:  Anytime you have any of the following symptoms: 1) 3 pound weight gain in 24 hours or 5 pounds in 1 week 2) shortness of breath, with or without a dry hacking cough 3) swelling in the hands, feet or stomach 4) if you have to sleep on extra pillows at night in order to breathe.   Complete by: As directed      Call MD for:  difficulty breathing, headache or visual disturbances   Complete by: As directed    Call MD for:  extreme fatigue   Complete by: As directed    Call MD for:  persistant dizziness or light-headedness   Complete by: As directed    Call MD for:  persistant nausea and vomiting   Complete by: As directed    Diet - low sodium heart healthy   Complete by: As directed    Discharge instructions   Complete by: As directed    It has been a pleasure taking care of you! You were hospitalized with atrial fibrillation (irregular heartbeat) in the setting of dehydration likely due to vomiting.  We have started you on medication to regulate your heart beat.  We have also started you on blood thinner to reduce your risk of stroke from irregular heartbeat.  We Tr[facility] have made some adjustment to your home medications as well. Please review your new medication list and the directions before you take your medications.  Please follow-up with your primary care doctor and cardiologist in 1 to 2 weeks.   Take care,   Increase activity slowly   Complete by: As directed      Allergies as of 01/27/2020   No Known Allergies     Medication List    STOP taking these medications   clopidogrel 75 MG tablet Commonly known as: PLAVIX   doxycycline 100 MG tablet Commonly known as: VIBRA-TABS   metroNIDAZOLE 500 MG tablet Commonly known as: FLAGYL   NEOMYCIN-POLYMYXIN-HYDROCORTISONE 1 % Soln OTIC solution Commonly known as: CORTISPORIN   traMADol 50 MG tablet Commonly known as: ULTRAM     TAKE these medications   acetaminophen 650 MG CR tablet Commonly known as: TYLENOL Take 1,300 mg by mouth 2 (two) times daily.   albuterol 108 (90 Base) MCG/ACT inhaler Commonly known as: VENTOLIN HFA Inhale 2 puffs into the lungs every 6 (six) hours as needed for wheezing or shortness of breath.   amiodarone 200 MG tablet Commonly known as: PACERONE Take 2 tablets (400 mg total) by mouth 2 (two)  times daily for 7 days, THEN 1 tablet (200 mg total) daily for 21 days. Start taking on: January 27, 2020   Anoro Ellipta 62.5-25 MCG/INH Aepb Generic drug: umeclidinium-vilanterol Inhale 1 puff into the lungs daily as needed (shortness of breath).   apixaban 5 MG Tabs tablet Commonly known as: ELIQUIS Take 1 tablet (5 mg total) by mouth 2 (two) times daily.   aspirin 81 MG tablet Take 81 mg by mouth daily.   atorvastatin 10 MG tablet Commonly known as: LIPITOR Take 1 tablet (10 mg total) by mouth daily.   diclofenac sodium 1 % Gel Commonly known as: Voltaren Apply 2 g topically 4 (four) times daily. What changed:   when to take this  reasons to take this   DULoxetine 30 MG capsule Commonly known as: CYMBALTA Take 30 mg by mouth every evening.   fluticasone 50 MCG/ACT nasal spray Commonly known as: FLONASE Place 1 spray into both nostrils 2 (  two) times daily as needed for allergies or rhinitis.   furosemide 20 MG tablet Commonly known as: Lasix Take 1 tablet (20 mg total) by mouth daily as needed for fluid or edema (or SOB or weight gain).   hydrOXYzine 25 MG tablet Commonly known as: ATARAX/VISTARIL Take 25 mg by mouth at bedtime.   levothyroxine 25 MCG tablet Commonly known as: SYNTHROID Take 25 mcg by mouth daily before breakfast.   lisinopril 20 MG tablet Commonly known as: ZESTRIL Take 1 tablet (20 mg total) by mouth daily.   loperamide 2 MG tablet Commonly known as: IMODIUM A-D Take 2-4 mg by mouth 4 (four) times daily as needed for diarrhea or loose stools.   meclizine 25 MG tablet Commonly known as: ANTIVERT Take 25 mg by mouth 2 (two) times daily as needed for dizziness or nausea.   metoprolol tartrate 50 MG tablet Commonly known as: LOPRESSOR Take 1 tablet (50 mg total) by mouth 2 (two) times daily.   MOVE FREE JOINT HEALTH ADVANCE PO Take 1 capsule by mouth 2 (two) times daily.   mupirocin ointment 2 % Commonly known as: BACTROBAN Apply 1  application topically 2 (two) times daily as needed (to affected areas- irritated hair follicles on face).   nitroGLYCERIN 0.4 MG SL tablet Commonly known as: Nitrostat Place 1 tablet (0.4 mg total) under the tongue every 5 (five) minutes as needed for chest pain.   ondansetron 4 MG tablet Commonly known as: ZOFRAN Take 1 tablet (4 mg total) by mouth every 8 (eight) hours as needed for nausea or vomiting.   pantoprazole 40 MG tablet Commonly known as: PROTONIX Take 1 tablet (40 mg total) by mouth daily. NEED OV. What changed:   when to take this  additional instructions   PROBIOTIC-10 PO Take 1 capsule by mouth daily.   VITAMIN B-12 IJ Inject 1,000 mcg as directed every 30 (thirty) days.   Vitamin D3 50 MCG (2000 UT) capsule Take 2,000 Units by mouth every evening.       Consultations:  Cardiology  Procedures/Studies:  2D Echo on 01/26/2020 1. Low normal LV systolic function; mild LVH; grade 1 diastolic  dysfunction.  2. Left ventricular ejection fraction, by estimation, is 50 to 55%. The  left ventricle has low normal function. The left ventricle has no regional  wall motion abnormalities. There is mild left ventricular hypertrophy.  Left ventricular diastolic  parameters are consistent with Grade I diastolic dysfunction (impaired  relaxation).  3. Right ventricular systolic function is normal. The right ventricular  size is normal. There is normal pulmonary artery systolic pressure.  4. The mitral valve is normal in structure. No evidence of mitral valve  regurgitation. No evidence of mitral stenosis.  5. The aortic valve is tricuspid. Aortic valve regurgitation is not  visualized. Mild aortic valve sclerosis is present, with no evidence of  aortic valve stenosis.  6. The inferior vena cava is normal in size with greater than 50%  respiratory variability, suggesting right atrial pressure of 3 mmHg.    CT ABDOMEN PELVIS WO CONTRAST  Result Date:  01/25/2020 CLINICAL DATA:  Nausea and vomiting. EXAM: CT ABDOMEN AND PELVIS WITHOUT CONTRAST TECHNIQUE: Multidetector CT imaging of the abdomen and pelvis was performed following the standard protocol without IV contrast. COMPARISON:  CT scan dated 10/05/2019 FINDINGS: Lower chest: No acute abnormalities. Hepatobiliary: No focal liver abnormality is seen. Status post cholecystectomy. No biliary dilatation. Pancreas: There are few chronic calcifications in the uncinate process of the  pancreas, stable. The pancreas is otherwise normal. Spleen: Normal in size without focal abnormality. Adrenals/Urinary Tract: Adrenal glands are normal. Kidneys are normal except for a benign-appearing 5.3 cm cyst on the lower pole of the right kidney. Left kidney is normal. No hydronephrosis. Bladder is normal. Stomach/Bowel: Stomach is within normal limits. Appendix appears normal. No evidence of bowel wall thickening, distention, or inflammatory changes. The cecum lies in the right mid abdomen as does the appendix. Vascular/Lymphatic: Aortic atherosclerosis. No enlarged abdominal or pelvic lymph nodes. Reproductive: Prostate is unremarkable. Other: No abdominal wall hernia or abnormality. No abdominopelvic ascites. Musculoskeletal: No acute abnormality. Severe degenerative disc disease in the lower lumbar spine. IMPRESSION: Benign-appearing abdomen and pelvis. Aortic Atherosclerosis (ICD10-I70.0). Electronically Signed   By: Lorriane Shire M.D.   On: 01/25/2020 15:34   DG Chest Portable 1 View  Result Date: 01/25/2020 CLINICAL DATA:  Acute supraventricular tachycardia EXAM: PORTABLE CHEST 1 VIEW COMPARISON:  Portable exam 1213 hours compared to 03/25/2019 FINDINGS: Normal heart size, mediastinal contours, and pulmonary vascularity. Atherosclerotic calcification aorta. Mild elevation of RIGHT diaphragm with RIGHT basilar atelectasis. Lungs otherwise clear. No acute infiltrate, pleural effusion or pneumothorax. Osseous structures  unremarkable. IMPRESSION: Mild RIGHT basilar atelectasis. Electronically Signed   By: Lavonia Dana M.D.   On: 01/25/2020 12:22   ECHOCARDIOGRAM COMPLETE  Result Date: 01/26/2020    ECHOCARDIOGRAM REPORT   Patient Name:   Johnny [name] Date of Exam: 01/26/2020 Medical Rec #:  [id number]   Height:       72.0 in Accession #:    [id number]  Weight:       197.1 lb Date of Birth:  February 11, 1941   BSA:          2.117 m Patient Age:    79 years    BP:           92/63 mmHg Patient Gender: M           HR:           71 bpm. Exam Location:  Inpatient Procedure: 2D Echo, Cardiac Doppler and Color Doppler Indications:    Atrial fibrillation  History:        Patient has prior history of Echocardiogram examinations, most                 recent 02/27/2019. CAD, Arrythmias:PSVT; Risk Factors:Diabetes and                 Hypertension.  Sonographer:    Dustin Flock Referring Phys: [id number] Mckinley Jewel  Sonographer Comments: Image acquisition challenging due to respiratory motion. IMPRESSIONS  1. Low normal LV systolic function; mild LVH; grade 1 diastolic dysfunction.  2. Left ventricular ejection fraction, by estimation, is 50 to 55%. The left ventricle has low normal function. The left ventricle has no regional wall motion abnormalities. There is mild left ventricular hypertrophy. Left ventricular diastolic parameters are consistent with Grade I diastolic dysfunction (impaired relaxation).  3. Right ventricular systolic function is normal. The right ventricular size is normal. There is normal pulmonary artery systolic pressure.  4. The mitral valve is normal in structure. No evidence of mitral valve regurgitation. No evidence of mitral stenosis.  5. The aortic valve is tricuspid. Aortic valve regurgitation is not visualized. Mild aortic valve sclerosis is present, with no evidence of aortic valve stenosis.  6. The inferior vena cava is normal in size with greater than 50% respiratory variability, suggesting right atrial pressure of 3  mmHg. FINDINGS  Left Ventricle: Left ventricular ejection fraction, by estimation, is 50 to 55%. The left ventricle has low normal function. The left ventricle has no regional wall motion abnormalities. The left ventricular internal cavity size was normal in size. There is mild left ventricular hypertrophy. Left ventricular diastolic parameters are consistent with Grade I diastolic dysfunction (impaired relaxation). Right Ventricle: The right ventricular size is normal.Right ventricular systolic function is normal. There is normal pulmonary artery systolic pressure. The tricuspid regurgitant velocity is 1.75 m/s, and with an assumed right atrial pressure of 3 mmHg, the estimated right ventricular systolic pressure is [id number] mmHg. Left Atrium: Left atrial size was normal in size. Right Atrium: Right atrial size was normal in size. Pericardium: There is no evidence of pericardial effusion. Mitral Valve: The mitral valve is normal in structure. Normal mobility of the mitral valve leaflets. No evidence of mitral valve regurgitation. No evidence of mitral valve stenosis. Tricuspid Valve: The tricuspid valve is normal in structure. Tricuspid valve regurgitation is trivial. No evidence of tricuspid stenosis. Aortic Valve: The aortic valve is tricuspid. Aortic valve regurgitation is not visualized. Mild aortic valve sclerosis is present, with no evidence of aortic valve stenosis. Pulmonic Valve: The pulmonic valve was not well visualized. Pulmonic valve regurgitation is not visualized. No evidence of pulmonic stenosis. Aorta: The aortic root is normal in size and structure. Venous: The inferior vena cava is normal in size with greater than 50% respiratory variability, suggesting right atrial pressure of 3 mmHg.  Additional Comments: Low normal LV systolic function; mild LVH; grade 1 diastolic dysfunction.  LEFT VENTRICLE PLAX 2D LVIDd:         4.70 cm  Diastology LVIDs:         3.70 cm  LV e' lateral:   4.90 cm/s LV PW:          1.20 cm  LV E/e' lateral: 13.6 LV IVS:        1.20 cm  LV e' medial:    5.77 cm/s LVOT diam:     2.30 cm  LV E/e' medial:  11.5 LV SV:         72 LV SV Index:   34 LVOT Area:     4.15 cm  RIGHT VENTRICLE RV Basal diam:  2.70 cm RV S prime:     8.81 cm/s TAPSE (M-mode): 2.5 cm LEFT ATRIUM             Index       RIGHT ATRIUM           Index LA diam:        3.40 cm 1.61 cm/m  RA Area:     11.70 cm LA Vol (A2C):   41.8 ml 19.74 ml/m RA Volume:   27.60 ml  13.04 ml/m LA Vol (A4C):   53.3 ml 25.17 ml/m LA Biplane Vol: 51.3 ml 24.23 ml/m  AORTIC VALVE LVOT Vmax:   80.90 cm/s LVOT Vmean:  45.800 cm/s LVOT VTI:    0.174 m  AORTA Ao Root diam: 3.60 cm MITRAL VALVE               TRICUSPID VALVE MV Area (PHT): 5.13 cm    TR Peak grad:   12.2 mmHg MV Decel Time: 148 msec    TR Vmax:        175.00 cm/s MV E velocity: 66.40 cm/s  SHUNTS                            Systemic VTI:  0.17 m                            Systemic Diam: 2.30 cm Kirk Ruths MD Electronically signed by Kirk Ruths MD Signature Date/Time: 01/26/2020/12:39:33 PM    Final        Discharge Exam: Vitals:   01/27/20 0512 01/27/20 0609  BP: 135/81   Pulse: 63 72  Resp: 20 17  Temp: 98.5 F (36.9 C)   SpO2: (!) 89% 98%    GENERAL: No acute distress.  Appears well.  HEENT: MMM.  Vision and hearing grossly intact.  NECK: Supple.  No apparent JVD.  RESP:  No IWOB. Good air movement bilaterally. CVS:  RRR. Heart sounds normal.  ABD/GI/GU: Bowel sounds present. Soft. Non tender.  MSK/EXT:  Moves extremities. No apparent deformity or edema.  SKIN: no apparent skin lesion or wound NEURO: Awake, alert and oriented appropriately.  No apparent focal neuro deficit. PSYCH: Calm. Normal affect.    The results of significant diagnostics from this hospitalization (including imaging, microbiology, ancillary and laboratory) are listed below for reference.     Microbiology: Recent Results (from the past 240  hour(s))  SARS CORONAVIRUS 2 (TAT 6-24 HRS) Nasopharyngeal Nasopharyngeal Swab     Status: None   Collection Time: 01/25/20  4:25 PM   Specimen: Nasopharyngeal Swab  Result Value Ref Range Status   SARS Coronavirus 2 NEGATIVE NEGATIVE Final    Comment: (NOTE) SARS-CoV-2 target nucleic acids are NOT DETECTED. The SARS-CoV-2 RNA is generally detectable in upper and lower respiratory specimens during the acute phase of infection. Negative results do not preclude SARS-CoV-2 infection, do not rule out co-infections with other pathogens, and should not be used as the sole basis for treatment or other patient management decisions. Negative results must be combined with clinical observations, patient history, and epidemiological information. The expected result is Negative. Fact Sheet for Patients: SugarRoll.be Fact Sheet for Healthcare Providers: https://www.woods-mathews.com/ This test is not yet approved or cleared by the [place] FDA and  has been authorized for detection and/or diagnosis of SARS-CoV-2 by FDA under an Emergency Use Authorization (EUA). This EUA will remain  in effect (meaning this test can be used) for the duration of the COVID-19 declaration under Section 56 4(b)(1) of the Act, 21 U.S.C. section 360bbb-3(b)(1), unless the authorization is terminated or revoked sooner. Performed at [facility], [place] [address] [place], [place] [address]      Labs: BNP (last 3 results) Recent Labs    03/25/19 1055  BNP [id number]*   Basic Metabolic Panel: Recent Labs  Lab 01/25/20 1133 01/25/20 1214 01/25/20 1725 01/26/20 0106 01/27/20 0349  NA 138 139  --  140 137  K 3.5 3.7  --  3.3* 3.7  CL 103 105  --  108 107  CO2 22  --   --  23 21*  GLUCOSE 137* 147*  --  102* 91  BUN 14 16  --  11 8  CREATININE 1.41* 1.50* 1.24 1.19 1.12  CALCIUM 9.5  --   --  8.5* 8.5*  MG  --   --  1.9 1.9 1.7  PHOS  --   --  3.4  --  2.7    Liver Function Tests: Recent  Labs  Lab 01/25/20 1133 01/27/20 0349  AST 20  --   ALT 14  --   ALKPHOS 59  --   BILITOT 1.1  --   PROT 7.4  --   ALBUMIN 4.2 3.2*   Recent Labs  Lab 01/25/20 1206  LIPASE 19   No results for input(s): AMMONIA in the last 168 hours. CBC: Recent Labs  Lab 01/25/20 1133 01/25/20 1206 01/25/20 1214 01/25/20 1725 01/26/20 0106  WBC 11.1* 12.7*  --  10.4 11.4*  NEUTROABS  --  9.5*  --   --   --   HGB 15.2 15.8 16.3 13.9 13.6  HCT 47.0 49.7 48.0 43.6 41.5  MCV 91.1 92.2  --  92.6 90.6  PLT 306 298  --  244 225   Cardiac Enzymes: No results for input(s): CKTOTAL, CKMB, CKMBINDEX, TROPONINI in the last 168 hours. BNP: Invalid input(s): POCBNP CBG: Recent Labs  Lab 01/26/20 0807 01/26/20 1151  GLUCAP 91 86   D-Dimer No results for input(s): DDIMER in the last 72 hours. Hgb A1c No results for input(s): HGBA1C in the last 72 hours. Lipid Profile No results for input(s): CHOL, HDL, LDLCALC, TRIG, CHOLHDL, LDLDIRECT in the last 72 hours. Thyroid function studies Recent Labs    01/25/20 1730  TSH 3.602   Anemia work up No results for input(s): VITAMINB12, FOLATE, FERRITIN, TIBC, IRON, RETICCTPCT in the last 72 hours. Urinalysis    Component Value Date/Time   COLORURINE YELLOW 01/25/2020 2256   APPEARANCEUR HAZY (A) 01/25/2020 2256   LABSPEC 1.020 01/25/2020 2256   PHURINE 5.0 01/25/2020 2256   GLUCOSEU NEGATIVE 01/25/2020 2256   HGBUR NEGATIVE 01/25/2020 2256   BILIRUBINUR NEGATIVE 01/25/2020 2256   KETONESUR 5 (A) 01/25/2020 2256   PROTEINUR 30 (A) 01/25/2020 2256   UROBILINOGEN 0.2 01/19/2008 1246   NITRITE NEGATIVE 01/25/2020 2256   LEUKOCYTESUR NEGATIVE 01/25/2020 2256   Sepsis Labs Invalid input(s): PROCALCITONIN,  WBC,  LACTICIDVEN   Time coordinating discharge: 35 minutes  SIGNED:  Mercy Riding, MD  [facility] 01/27/2020, 9:42 AM  If 7PM-7AM, please contact night-coverage [email or web address] Password  [email or web address]

## 2020-01-27 NOTE — Progress Notes (Signed)
Cardiology Progress Note  Patient ID: Johnny [name] MRN: [id number] DOB: [birth year] Date of Encounter: 01/27/2020  Primary Cardiologist: Peter Martinique, MD  Subjective  Confused this AM. Maintaining sinus rhythm.   ROS:  All other ROS reviewed and negative. Pertinent positives noted in the HPI.     Inpatient Medications  Scheduled Meds: . amiodarone  400 mg Oral BID   Followed by  . [START ON 02/03/2020] amiodarone  200 mg Oral Daily  . apixaban  5 mg Oral BID  . aspirin  81 mg Oral Daily  . atorvastatin  10 mg Oral Daily  . DULoxetine  30 mg Oral QPM  . hydrOXYzine  25 mg Oral QHS  . levothyroxine  25 mcg Oral QAC breakfast  . metoprolol tartrate  25 mg Oral BID  . pantoprazole  40 mg Oral QAC breakfast  . potassium chloride  40 mEq Oral Once   Continuous Infusions: . sodium chloride 100 mL/hr at 01/27/20 0024  . magnesium sulfate bolus IVPB     PRN Meds: acetaminophen **OR** acetaminophen, ondansetron **OR** ondansetron (ZOFRAN) IV   Vital Signs   Vitals:   01/26/20 2112 01/26/20 2309 01/27/20 0512 01/27/20 0609  BP:  125/84 135/81   Pulse: 61 (!) 58 63 72  Resp: 14 (!) 22 20 17   Temp:   98.5 F (36.9 C)   TempSrc:   Oral   SpO2: 99% 100% (!) 89% 98%  Weight:    92.5 kg  Height:        Intake/Output Summary (Last 24 hours) at 01/27/2020 0940 Last data filed at 01/27/2020 0647 Gross per 24 hour  Intake 1498.89 ml  Output 500 ml  Net 998.89 ml   Last 3 Weights 01/27/2020 01/25/2020 01/25/2020  Weight (lbs) 203 lb 14.8 oz 197 lb 1.5 oz 190 lb  Weight (kg) 92.5 kg 89.4 kg 86.183 kg      Telemetry  Overnight telemetry shows NSR, which I personally reviewed.   ECG  The most recent ECG shows sinus bradycardia 59 bpm, which I personally reviewed.   Physical Exam   Vitals:   01/26/20 2112 01/26/20 2309 01/27/20 0512 01/27/20 0609  BP:  125/84 135/81   Pulse: 61 (!) 58 63 72  Resp: 14 (!) 22 20 17   Temp:   98.5 F (36.9 C)   TempSrc:   Oral   SpO2: 99% 100% (!)  89% 98%  Weight:    92.5 kg  Height:         Intake/Output Summary (Last 24 hours) at 01/27/2020 0940 Last data filed at 01/27/2020 0647 Gross per 24 hour  Intake 1498.89 ml  Output 500 ml  Net 998.89 ml    Last 3 Weights 01/27/2020 01/25/2020 01/25/2020  Weight (lbs) 203 lb 14.8 oz 197 lb 1.5 oz 190 lb  Weight (kg) 92.5 kg 89.4 kg 86.183 kg    Body mass index is 27.66 kg/m.   General: eager to go home  Head: Atraumatic, normal size  Eyes: PEERLA, EOMI  Neck: Supple, no JVD Endocrine: No thryomegaly Cardiac: Normal S1, S2; RRR; no murmurs, rubs, or gallops Lungs: Clear to auscultation bilaterally, no wheezing, rhonchi or rales  Abd: Soft, nontender, no hepatomegaly  Ext: No edema, pulses 2+ Musculoskeletal: No deformities, BUE and BLE strength normal and equal Skin: Warm and dry, no rashes   Neuro: Alert and oriented to person, place, time, and situation, CNII-XII grossly intact, no focal deficits  Psych: Normal mood and affect   Labs  High Sensitivity Troponin:  No results for input(s): TROPONINIHS in the last 720 hours.   Cardiac EnzymesNo results for input(s): TROPONINI in the last 168 hours. No results for input(s): TROPIPOC in the last 168 hours.  Chemistry Recent Labs  Lab 01/25/20 1133 01/25/20 1133 01/25/20 1214 01/25/20 1725 01/26/20 0106 01/27/20 0349  NA 138   < > 139  --  140 137  K 3.5   < > 3.7  --  3.3* 3.7  CL 103   < > 105  --  108 107  CO2 22  --   --   --  23 21*  GLUCOSE 137*   < > 147*  --  102* 91  BUN 14   < > 16  --  11 8  CREATININE 1.41*   < > 1.50* 1.24 1.19 1.12  CALCIUM 9.5  --   --   --  8.5* 8.5*  PROT 7.4  --   --   --   --   --   ALBUMIN 4.2  --   --   --   --  3.2*  AST 20  --   --   --   --   --   ALT 14  --   --   --   --   --   ALKPHOS 59  --   --   --   --   --   BILITOT 1.1  --   --   --   --   --   GFRNONAA 47*   < >  --  55* 57* >60  GFRAA 54*   < >  --  >60 >60 >60  ANIONGAP 13  --   --   --  9 9   < > = values in this  interval not displayed.    Hematology Recent Labs  Lab 01/25/20 1206 01/25/20 1206 01/25/20 1214 01/25/20 1725 01/26/20 0106  WBC 12.7*  --   --  10.4 11.4*  RBC 5.39  --   --  4.71 4.58  HGB 15.8   < > 16.3 13.9 13.6  HCT 49.7   < > 48.0 43.6 41.5  MCV 92.2  --   --  92.6 90.6  MCH 29.3  --   --  29.5 29.7  MCHC 31.8  --   --  31.9 32.8  RDW 13.5  --   --  13.4 13.5  PLT 298  --   --  244 225   < > = values in this interval not displayed.   BNPNo results for input(s): BNP, PROBNP in the last 168 hours.  DDimer No results for input(s): DDIMER in the last 168 hours.   Radiology  CT ABDOMEN PELVIS WO CONTRAST  Result Date: 01/25/2020 CLINICAL DATA:  Nausea and vomiting. EXAM: CT ABDOMEN AND PELVIS WITHOUT CONTRAST TECHNIQUE: Multidetector CT imaging of the abdomen and pelvis was performed following the standard protocol without IV contrast. COMPARISON:  CT scan dated 10/05/2019 FINDINGS: Lower chest: No acute abnormalities. Hepatobiliary: No focal liver abnormality is seen. Status post cholecystectomy. No biliary dilatation. Pancreas: There are few chronic calcifications in the uncinate process of the pancreas, stable. The pancreas is otherwise normal. Spleen: Normal in size without focal abnormality. Adrenals/Urinary Tract: Adrenal glands are normal. Kidneys are normal except for a benign-appearing 5.3 cm cyst on the lower pole of the right kidney. Left kidney is normal. No hydronephrosis. Bladder is normal. Stomach/Bowel: Stomach is within  normal limits. Appendix appears normal. No evidence of bowel wall thickening, distention, or inflammatory changes. The cecum lies in the right mid abdomen as does the appendix. Vascular/Lymphatic: Aortic atherosclerosis. No enlarged abdominal or pelvic lymph nodes. Reproductive: Prostate is unremarkable. Other: No abdominal wall hernia or abnormality. No abdominopelvic ascites. Musculoskeletal: No acute abnormality. Severe degenerative disc disease in  the lower lumbar spine. IMPRESSION: Benign-appearing abdomen and pelvis. Aortic Atherosclerosis (ICD10-I70.0). Electronically Signed   By: Lorriane Shire M.D.   On: 01/25/2020 15:34   DG Chest Portable 1 View  Result Date: 01/25/2020 CLINICAL DATA:  Acute supraventricular tachycardia EXAM: PORTABLE CHEST 1 VIEW COMPARISON:  Portable exam 1213 hours compared to 03/25/2019 FINDINGS: Normal heart size, mediastinal contours, and pulmonary vascularity. Atherosclerotic calcification aorta. Mild elevation of RIGHT diaphragm with RIGHT basilar atelectasis. Lungs otherwise clear. No acute infiltrate, pleural effusion or pneumothorax. Osseous structures unremarkable. IMPRESSION: Mild RIGHT basilar atelectasis. Electronically Signed   By: Lavonia Dana M.D.   On: 01/25/2020 12:22   ECHOCARDIOGRAM COMPLETE  Result Date: 01/26/2020    ECHOCARDIOGRAM REPORT   Patient Name:   CAIDE FRIEDMAN Brook Date of Exam: 01/26/2020 Medical Rec #:  [id number]   Height:       72.0 in Accession #:    [id number]  Weight:       197.1 lb Date of Birth:  Jun 26, 1941   BSA:          2.117 m Patient Age:    53 years    BP:           92/63 mmHg Patient Gender: M           HR:           71 bpm. Exam Location:  Inpatient Procedure: 2D Echo, Cardiac Doppler and Color Doppler Indications:    Atrial fibrillation  History:        Patient has prior history of Echocardiogram examinations, most                 recent 02/27/2019. CAD, Arrythmias:PSVT; Risk Factors:Diabetes and                 Hypertension.  Sonographer:    Dustin Flock Referring Phys: [id number] Mckinley Jewel  Sonographer Comments: Image acquisition challenging due to respiratory motion. IMPRESSIONS  1. Low normal LV systolic function; mild LVH; grade 1 diastolic dysfunction.  2. Left ventricular ejection fraction, by estimation, is 50 to 55%. The left ventricle has low normal function. The left ventricle has no regional wall motion abnormalities. There is mild left ventricular hypertrophy. Left  ventricular diastolic parameters are consistent with Grade I diastolic dysfunction (impaired relaxation).  3. Right ventricular systolic function is normal. The right ventricular size is normal. There is normal pulmonary artery systolic pressure.  4. The mitral valve is normal in structure. No evidence of mitral valve regurgitation. No evidence of mitral stenosis.  5. The aortic valve is tricuspid. Aortic valve regurgitation is not visualized. Mild aortic valve sclerosis is present, with no evidence of aortic valve stenosis.  6. The inferior vena cava is normal in size with greater than 50% respiratory variability, suggesting right atrial pressure of 3 mmHg. FINDINGS  Left Ventricle: Left ventricular ejection fraction, by estimation, is 50 to 55%. The left ventricle has low normal function. The left ventricle has no regional wall motion abnormalities. The left ventricular internal cavity size was normal in size. There is mild left ventricular hypertrophy. Left ventricular diastolic parameters are consistent with  Grade I diastolic dysfunction (impaired relaxation). Right Ventricle: The right ventricular size is normal.Right ventricular systolic function is normal. There is normal pulmonary artery systolic pressure. The tricuspid regurgitant velocity is 1.75 m/s, and with an assumed right atrial pressure of 3 mmHg, the estimated right ventricular systolic pressure is [id number] mmHg. Left Atrium: Left atrial size was normal in size. Right Atrium: Right atrial size was normal in size. Pericardium: There is no evidence of pericardial effusion. Mitral Valve: The mitral valve is normal in structure. Normal mobility of the mitral valve leaflets. No evidence of mitral valve regurgitation. No evidence of mitral valve stenosis. Tricuspid Valve: The tricuspid valve is normal in structure. Tricuspid valve regurgitation is trivial. No evidence of tricuspid stenosis. Aortic Valve: The aortic valve is tricuspid. Aortic valve  regurgitation is not visualized. Mild aortic valve sclerosis is present, with no evidence of aortic valve stenosis. Pulmonic Valve: The pulmonic valve was not well visualized. Pulmonic valve regurgitation is not visualized. No evidence of pulmonic stenosis. Aorta: The aortic root is normal in size and structure. Venous: The inferior vena cava is normal in size with greater than 50% respiratory variability, suggesting right atrial pressure of 3 mmHg.  Additional Comments: Low normal LV systolic function; mild LVH; grade 1 diastolic dysfunction.  LEFT VENTRICLE PLAX 2D LVIDd:         4.70 cm  Diastology LVIDs:         3.70 cm  LV e' lateral:   4.90 cm/s LV PW:         1.20 cm  LV E/e' lateral: 13.6 LV IVS:        1.20 cm  LV e' medial:    5.77 cm/s LVOT diam:     2.30 cm  LV E/e' medial:  11.5 LV SV:         72 LV SV Index:   34 LVOT Area:     4.15 cm  RIGHT VENTRICLE RV Basal diam:  2.70 cm RV S prime:     8.81 cm/s TAPSE (M-mode): 2.5 cm LEFT ATRIUM             Index       RIGHT ATRIUM           Index LA diam:        3.40 cm 1.61 cm/m  RA Area:     11.70 cm LA Vol (A2C):   41.8 ml 19.74 ml/m RA Volume:   27.60 ml  13.04 ml/m LA Vol (A4C):   53.3 ml 25.17 ml/m LA Biplane Vol: 51.3 ml 24.23 ml/m  AORTIC VALVE LVOT Vmax:   80.90 cm/s LVOT Vmean:  45.800 cm/s LVOT VTI:    0.174 m  AORTA Ao Root diam: 3.60 cm MITRAL VALVE               TRICUSPID VALVE MV Area (PHT): 5.13 cm    TR Peak grad:   12.2 mmHg MV Decel Time: 148 msec    TR Vmax:        175.00 cm/s MV E velocity: 66.40 cm/s                            SHUNTS                            Systemic VTI:  0.17 m  Systemic Diam: 2.30 cm Kirk Ruths MD Electronically signed by Kirk Ruths MD Signature Date/Time: 01/26/2020/12:39:33 PM    Final     Cardiac Studies  TTE - pending   LHC 11/05/2018  Previously placed Prox LAD stent (unknown type) is widely patent.  Mid LAD lesion is 45% stenosed.  LV end diastolic pressure is  normal.   1. Nonobstructive CAD 2. Normal LVEDP  Patient Profile  Johnny [name] is a 79 y.o. male with history of CAD s/p PCI pLAD (cath with 45% mid LAD 2020), SVT who was admitted 01/25/2020 for dehydration/lactic acidosis found to be in Afib with RVR.   Assessment & Plan   1. New onset Afib with RVR -CHADSVASC = 3. Continue eliquis.  -back in NSR. Continue amiodarone load (400 mg BID for 7 days and then 200 mg for 21 days). This will be 30 days and stop. Not long term med due to secondary Afib.  -Drop plavix and continue ASA for CAD  2. CAD s/p PCI pLAD -LHC with 45% mid LAD -patent stent -continue ASA/statin. Drop plavix   [facility] will sign off.   Medication Recommendations:  Amiodarone as above, ASA/eliquis, stop plavix, continue other BP medications Other recommendations (labs, testing, etc):  none Follow up as an outpatient:  We will arrange follow up with Dr. Martinique in 4-6 weeks  Signed, Addison Naegeli. Audie Box, [place]  01/27/2020 9:42 AM   For questions or updates, please contact [place] Please consult [email or web address] for contact info under   Time Spent with Patient: I have spent a total of 25 minutes with patient reviewing hospital notes, telemetry, EKGs, labs and examining the patient as well as establishing an assessment and plan that was discussed with the patient.  > 50% of time was spent in direct patient care.    Signed, Addison Naegeli. Audie Box, [place]  01/27/2020 9:40 AM

## 2020-01-27 NOTE — TOC Benefit Eligibility Note (Signed)
Transition of Care [facility]) Benefit Eligibility Note    Patient Details  Name: Johnny [name] MRN: [id number] Date of Birth: Jun 27, 1941   Medication/Dose: ELIQUIS  2.5 MG BID  , CO-PAY- $ 209.56   and   ELIQUIS  5MG BID , CO-PAY- $209.56  Covered?: Yes  Tier: (IN DEDUCTIBLE PHASE)  Prescription Coverage Preferred Pharmacy: [facility]  Spoke with Person/Company/Phone Number:: TONYA    @ [place] AC #  [phone]  Co-Pay: $209.56  FOR EACH PRESCRIPTION  Prior Approval: No  Deductible: Unmet(OUT -OF-POCKET: NOT MET)       Memory Argue Phone Number: 01/27/2020, 11:36 AM

## 2020-01-27 NOTE — Care Management (Signed)
[id number] 01-27-20 Benefits check submitted for Eliquis. Case Manager will follow for cost. Graves-Bigelow, Ocie Cornfield, RN,BSN Case Manager

## 2020-02-20 ENCOUNTER — Telehealth: Payer: Self-pay | Admitting: Cardiology

## 2020-02-20 NOTE — Telephone Encounter (Signed)
We will just need to wait and evaluate him at his OV as amiodarone has already been reduced.  Suesan Mohrmann Martinique MD, [facility]

## 2020-02-20 NOTE — Telephone Encounter (Signed)
Returned the call to the patient's daughter and his nurse. The nurse is with him 5 days a week. She stated that the patient has been complaining of extreme fatigue lately and is wondering if it could be the Amiodarone. He is currently on 200mg  once daily (having completed the 7 day 400mg  bid) which was started at his hospital discharge on 01/27/20.  The patient does not have any edema or shortness of breath outside his norm. The nurse stated that the patient's blood pressure and heart rate have been normal, although she did not have any readings to give at this time.  The patient has a follow up with Dr. Martinique on 02/27/20.

## 2020-02-20 NOTE — Telephone Encounter (Signed)
Pt c/o medication issue:  1. Name of Medication: amiodarone (PACERONE) 200 MG tablet  2. How are you currently taking this medication (dosage and times per day)? 1 tablet by mouth daily   3. Are you having a reaction (difficulty breathing--STAT)? Yes   4. What is your medication issue? Amy is calling stating Kale has been experiencing extreme fatigue, but no other symptoms are present. She states she researched his medications and thinkms it Samaras be due to him taking amiodarone. Please advise.

## 2020-02-21 NOTE — Telephone Encounter (Signed)
Returned call to patient's daughter Amy.Dr.Jordan's advice given.Advised to keep appointment as planned 5/3 at 9:20 am.

## 2020-02-23 NOTE — Progress Notes (Signed)
Johnny [name] Date of Birth: [birth year]   History of Present Illness: [name] is seen for follow up SVT and CAD.  He has a history of supraventricular tachycardia. He is s/p stenting of the proximal LAD in 2005 with a Cypher stent. He is on chronic DAPT. He had repeat caths in 2010 and Dec. 2012 with nonobstructive disease.   He was admitted 1/3-10/30/18 with complaints of dyspnea, chest pressure and a "fluttering" sensation. Troponin levels were negative x 3. V/Q scan was low probability for PE. Creatinine was elevated to 1.79. ACEi and diuretic were held. Metoprolol increased for fluttering. There was some consideration for cardiac cath but this was deferred due to elevated creatinine. On follow up he had  persistent anterior chest pressure and tightness like he is wearing a seatbelt that is too tight. BP has been running higher. He has no energy and complains of SOB.  Due to these persistent symptoms and improvement in renal function he did undergo a cardiac cath on 11/05/18 showing nonobstructive CAD.  In [place] he underwent laproscopic cholecystectomy. Initially did well but returned the day after DC with confusion and metabolic encephalopathy. Oxygen level was low. CT negative for PE. Echo stable. BNP mildly elevated as were troponin levels. CXR some atx. CT head was OK. DC on 5/30.   He as admitted 3/30-01/27/20 with Afib and RVR. This followed acute food poisoning with projectile N/V after eating  barbecue. He was dehydrated. Seen by cardiology and started on amiodarone and Eliquis. Plavix discontinued. He converted to NSR. DC on amiodarone 400 mg daily later reduced to 200 mg. Echo was unremarkable.   On follow up today he is still veryweak. He is still having episodes of N/V usually when he exerts himself.  He denies  SOB or  chest pain or edema. No recurrent palpitations.    Current Outpatient Medications on File Prior to Visit  Medication Sig Dispense Refill  . acetaminophen (TYLENOL) 650 MG  CR tablet Take 1,300 mg by mouth 2 (two) times daily.     [name] albuterol (VENTOLIN HFA) 108 (90 Base) MCG/ACT inhaler Inhale 2 puffs into the lungs every 6 (six) hours as needed for wheezing or shortness of breath. 18 g 1  . ANORO ELLIPTA 62.5-25 MCG/INH AEPB Inhale 1 puff into the lungs daily as needed (shortness of breath).     [name] apixaban (ELIQUIS) 5 MG TABS tablet Take 1 tablet (5 mg total) by mouth 2 (two) times daily. 180 tablet 1  . aspirin 81 MG tablet Take 81 mg by mouth daily.      [name] atorvastatin (LIPITOR) 10 MG tablet Take 1 tablet (10 mg total) by mouth daily. 90 tablet 3  . Cholecalciferol (VITAMIN D3) 50 MCG (2000 UT) capsule Take 2,000 Units by mouth every evening.    . Cyanocobalamin (VITAMIN B-12 IJ) Inject 1,000 mcg as directed every 30 (thirty) days.     . diclofenac sodium (VOLTAREN) 1 % GEL Apply 2 g topically 4 (four) times daily. (Patient taking differently: Apply 2 g topically 4 (four) times daily as needed (hip pain). ) 1 Tube 5  . DULoxetine (CYMBALTA) 30 MG capsule Take 30 mg by mouth every evening.    . fluticasone (FLONASE) 50 MCG/ACT nasal spray Place 1 spray into both nostrils 2 (two) times daily as needed for allergies or rhinitis.     . furosemide (LASIX) 20 MG tablet Take 1 tablet (20 mg total) by mouth daily as needed for fluid or  edema (or SOB or weight gain). 30 tablet 0  . Glucos-Chond-Hyal Ac-Ca Fructo (MOVE FREE JOINT HEALTH ADVANCE PO) Take 1 capsule by mouth 2 (two) times daily.    . hydrOXYzine (ATARAX) 25 MG tablet Take 25 mg by mouth at bedtime.     [name] levothyroxine (SYNTHROID, LEVOTHROID) 25 MCG tablet Take 25 mcg by mouth daily before breakfast.     . lisinopril (ZESTRIL) 20 MG tablet Take 1 tablet (20 mg total) by mouth daily. 90 tablet 3  . loperamide (IMODIUM A-D) 2 MG tablet Take 2-4 mg by mouth 4 (four) times daily as needed for diarrhea or loose stools.     . meclizine (ANTIVERT) 25 MG tablet Take 25 mg by mouth 2 (two) times daily as needed for  dizziness or nausea.     . metoprolol tartrate (LOPRESSOR) 50 MG tablet Take 1 tablet (50 mg total) by mouth 2 (two) times daily. 180 tablet 1  . mupirocin ointment (BACTROBAN) 2 % Apply 1 application topically 2 (two) times daily as needed (to affected areas- irritated hair follicles on face).     . nitroGLYCERIN (NITROSTAT) 0.4 MG SL tablet Place 1 tablet (0.4 mg total) under the tongue every 5 (five) minutes as needed for chest pain. 1 tablet 0  . ondansetron (ZOFRAN) 4 MG tablet Take 1 tablet (4 mg total) by mouth every 8 (eight) hours as needed for nausea or vomiting. 12 tablet 0  . pantoprazole (PROTONIX) 40 MG tablet Take 1 tablet (40 mg total) by mouth daily. NEED OV. (Patient taking differently: Take 40 mg by mouth daily before breakfast. ) 180 tablet 1  . Probiotic Product (PROBIOTIC-10 PO) Take 1 capsule by mouth daily.      No current facility-administered medications on file prior to visit.    No Known Allergies  Past Medical History:  Diagnosis Date  . Acromioclavicular joint arthritis   . Anemia   . B12 deficiency 10/15/11   "take injections q 28 days"  . Coronary artery disease    LHC 10/15/11: LAD stent patent with mid ectasia, distal LAD 50-60%, mid circumflex with ectasia, EF 55-65%.  . Dyslipidemia   . GERD (gastroesophageal reflux disease)   . Glucose intolerance (pre-diabetes)    Borderline glucose intolerance  . Hypertension   . Hypothyroidism   . Post concussive syndrome 2011   "for ~ 6-8 months"  . Rotator cuff tear   . Shortness of breath 10/15/11   "here lately I've been having it on & off any time"  . SVT (supraventricular tachycardia) ([place])    maintained on beta blocker    Past Surgical History:  Procedure Laterality Date  . BACK SURGERY    . CARDIAC CATHETERIZATION  12/19/08   NORMAL. EF 55%  . CARDIAC CATHETERIZATION  10/15/11  . CARDIOVASCULAR STRESS TEST  10/2010  . CARPAL TUNNEL RELEASE     bilaterally  . CHOLECYSTECTOMY  03/23/2019  .  COLONOSCOPY W/ ENDOSCOPIC [place]    . CORONARY ANGIOPLASTY WITH STENT PLACEMENT  9/ 2005   . JOINT REPLACEMENT    . LAPAROSCOPIC CHOLECYSTECTOMY SINGLE SITE WITH INTRAOPERATIVE CHOLANGIOGRAM N/A 03/23/2019   Procedure: SINGLE SITE LAPAROSCOPIC CHOLECYSTECTOMY WITH INTRAOPERATIVE CHOLANGIOGRAM;  Surgeon: Michael Boston, MD;  Location: [facility];  Service: General;  Laterality: N/A;  . LEFT HEART CATH AND CORONARY ANGIOGRAPHY N/A 11/05/2018   Procedure: LEFT HEART CATH AND CORONARY ANGIOGRAPHY;  Surgeon: Martinique, Mattson Dayal M, MD;  Location: [facility];  Service: Cardiovascular;  Laterality: N/A;  .  LEFT HEART CATHETERIZATION WITH CORONARY ANGIOGRAM N/A 10/15/2011   Procedure: LEFT HEART CATHETERIZATION WITH CORONARY ANGIOGRAM;  Surgeon: Sherren Mocha, MD;  Location: [facility] CATH LAB;  Service: Cardiovascular;  Laterality: N/A;  . LUMBAR LAMINECTOMY  1970's  . NASAL SINUS SURGERY     x2  . reconstructive shoulder surgery     right  . REPLACEMENT TOTAL KNEE  12/2007   left  . RESECTION TUMOR CLAVICLE RADICAL     Open distal clavicle resection  . SHOULDER SURGERY  02/11/2011   right  . TRIGGER FINGER RELEASE     ? both hands    Social History   Tobacco Use  Smoking Status Former Smoker  . Packs/day: 1.00  . Years: 21.00  . Pack years: 21.00  . Types: Cigarettes  . Quit date: 04/07/1976  . Years since quitting: 43.9  Smokeless Tobacco Never Used    Social History   Substance and Sexual Activity  Alcohol Use Not Currently   Comment: 10/15/11 "haven't drank in 15-20 years"    Family History  Problem Relation Age of Onset  . Stroke Mother   . Heart attack Father   . Diabetes Father   . Kidney failure Father     Review of Systems: As noted in history of present illness.  All other systems were reviewed and are negative.  Physical Exam: BP 112/65   Pulse (!) 54   Ht 6' (1.829 m)   Wt 197 lb 6.4 oz (89.5 kg)   SpO2 98%   BMI 26.77 kg/m  GENERAL:  Well appearing WM in NAD HEENT:   PERRL, EOMI, sclera are clear. Oropharynx is clear. NECK:  No jugular venous distention, carotid upstroke brisk and symmetric, no bruits, no thyromegaly or adenopathy LUNGS:  Clear to auscultation bilaterally CHEST:  Unremarkable HEART:  RRR,  PMI not displaced or sustained,S1 and S2 within normal limits, no S3, no S4: no clicks, no rubs, no murmurs ABD:  Soft, nontender. BS +, no masses or bruits. No hepatomegaly, no splenomegaly EXT:  2 + pulses throughout, no edema, no cyanosis no clubbing. No hematoma at cath site. SKIN:  Warm and dry.  No rashes NEURO:  Alert and oriented x 3. Cranial nerves II through XII intact. PSYCH:  Cognitively intact      LABORATORY DATA:  Lab Results  Component Value Date   WBC 11.4 (H) 01/26/2020   HGB 13.6 01/26/2020   HCT 41.5 01/26/2020   PLT 225 01/26/2020   GLUCOSE 91 01/27/2020   CHOL 133 10/30/2018   TRIG 142 10/30/2018   HDL 39 (L) 10/30/2018   LDLCALC 66 10/30/2018   ALT 14 01/25/2020   AST 20 01/25/2020   NA 137 01/27/2020   K 3.7 01/27/2020   CL 107 01/27/2020   CREATININE 1.12 01/27/2020   BUN 8 01/27/2020   CO2 21 (L) 01/27/2020   TSH 3.602 01/25/2020   INR 1.1 03/25/2019   HGBA1C 5.0 10/29/2018    Reviewed from primary care dated 05/14/16: cholesterol 141, triglycerides 174, HDL39, LDL 67. BUN 23, creatinine 1.5. Other chemistries, Hgb, and TSH were normal. Dated 07/01/19: cholesterol 144, triglycerides 92, HDL 49, LDL 77. A1c 5.3%.    Cardiac cath 11/05/18: Procedures   LEFT HEART CATH AND CORONARY ANGIOGRAPHY  Conclusion     Previously placed Prox LAD stent (unknown type) is widely patent.  Mid LAD lesion is 45% stenosed.  LV end diastolic pressure is normal.   1. Nonobstructive CAD 2. Normal LVEDP  Plan: continue medical therapy. Will obtain an Echo as outpatient to assess LV function. Consider alternative causes of chest pain.   Echo 01/26/20: IMPRESSIONS    1. Low normal LV systolic function; mild LVH;  grade 1 diastolic  dysfunction.  2. Left ventricular ejection fraction, by estimation, is 50 to 55%. The  left ventricle has low normal function. The left ventricle has no regional  wall motion abnormalities. There is mild left ventricular hypertrophy.  Left ventricular diastolic  parameters are consistent with Grade I diastolic dysfunction (impaired  relaxation).  3. Right ventricular systolic function is normal. The right ventricular  size is normal. There is normal pulmonary artery systolic pressure.  4. The mitral valve is normal in structure. No evidence of mitral valve  regurgitation. No evidence of mitral stenosis.  5. The aortic valve is tricuspid. Aortic valve regurgitation is not  visualized. Mild aortic valve sclerosis is present, with no evidence of  aortic valve stenosis.  6. The inferior vena cava is normal in size with greater than 50%  respiratory variability, suggesting right atrial pressure of 3 mmHg.   Assessment / Plan: 1. Coronary disease. S/p remote stenting of the proximal LAD with Cypher stent in 2005.  Cardiac catheterization 11/05/18  showed nonobstructive disease.   2. Dyslipidemia LDL at goal. Continue statin  3. Hypertension. Well controlled.   4. SVT controlled on metoprolol  5. S/p lap choly in [place] 2020  6. Atrial fibrillation. Triggered by stress of acute food poisoning. Converted to NSR on amiodarone. [place] vasc score of 3-4. On anticoagulation. Now with persistent fatigue, weakness and intermittent N/V. I suspect a lot of this is related to amiodarone. Will discontinue amiodarone now. If symptoms to do not resolve Meter need to see Dr Reynaldo Minium in follow up. I will see in 3 months. If Afib recurs will need to consider alternative therapy.

## 2020-02-27 ENCOUNTER — Encounter: Payer: Self-pay | Admitting: Cardiology

## 2020-02-27 ENCOUNTER — Other Ambulatory Visit: Payer: Self-pay

## 2020-02-27 ENCOUNTER — Ambulatory Visit (INDEPENDENT_AMBULATORY_CARE_PROVIDER_SITE_OTHER): Payer: Medicare Other | Admitting: Cardiology

## 2020-02-27 VITALS — BP 112/65 | HR 54 | Ht 72.0 in | Wt 197.4 lb

## 2020-02-27 DIAGNOSIS — I25110 Atherosclerotic heart disease of native coronary artery with unstable angina pectoris: Secondary | ICD-10-CM

## 2020-02-27 DIAGNOSIS — I10 Essential (primary) hypertension: Secondary | ICD-10-CM | POA: Diagnosis not present

## 2020-02-27 DIAGNOSIS — I471 Supraventricular tachycardia: Secondary | ICD-10-CM

## 2020-02-27 DIAGNOSIS — D510 Vitamin B12 deficiency anemia due to intrinsic factor deficiency: Secondary | ICD-10-CM | POA: Diagnosis not present

## 2020-02-27 DIAGNOSIS — I4891 Unspecified atrial fibrillation: Secondary | ICD-10-CM

## 2020-02-27 DIAGNOSIS — E7800 Pure hypercholesterolemia, unspecified: Secondary | ICD-10-CM | POA: Diagnosis not present

## 2020-02-27 NOTE — Patient Instructions (Signed)
Stop taking amiodarone.   Continue your other therapy  Follow up in 3 months

## 2020-02-29 MED ORDER — APIXABAN 5 MG PO TABS: 5.0000 mg | ORAL_TABLET | Freq: Two times a day (BID) | ORAL | 1 refills | Status: DC

## 2020-04-03 DIAGNOSIS — D510 Vitamin B12 deficiency anemia due to intrinsic factor deficiency: Secondary | ICD-10-CM | POA: Diagnosis not present

## 2020-04-05 DIAGNOSIS — H2512 Age-related nuclear cataract, left eye: Secondary | ICD-10-CM | POA: Diagnosis not present

## 2020-05-08 DIAGNOSIS — D510 Vitamin B12 deficiency anemia due to intrinsic factor deficiency: Secondary | ICD-10-CM | POA: Diagnosis not present

## 2020-05-26 ENCOUNTER — Other Ambulatory Visit: Payer: Self-pay | Admitting: Cardiology

## 2020-06-08 NOTE — Progress Notes (Signed)
Dietrich M Clawson Date of Birth: January 09, 1941   History of Present Illness: Mr. Luth is seen for follow up SVT and CAD.  He has a history of supraventricular tachycardia. He is s/p stenting of the proximal LAD in 2005 with a Cypher stent. He is on chronic DAPT. He had repeat caths in 2010 and Dec. 2012 with nonobstructive disease.   He was admitted 1/3-10/30/18 with complaints of dyspnea, chest pressure and a "fluttering" sensation. Troponin levels were negative x 3. V/Q scan was low probability for PE. Creatinine was elevated to 1.79. ACEi and diuretic were held. Metoprolol increased for fluttering. There was some consideration for cardiac cath but this was deferred due to elevated creatinine. On follow up he had  persistent anterior chest pressure and tightness like he is wearing a seatbelt that is too tight. BP has been running higher. He has no energy and complains of SOB.  Due to these persistent symptoms and improvement in renal function he did undergo a cardiac cath on 11/05/18 showing nonobstructive CAD.  In [place] 2020 he underwent laproscopic cholecystectomy. Initially did well but returned the day after DC with confusion and metabolic encephalopathy. Oxygen level was low. CT negative for PE. Echo stable. BNP mildly elevated as were troponin levels. CXR some atx. CT head was OK. DC on 5/30.   He as admitted 3/30-01/27/20 with Afib and RVR. This followed acute food poisoning with projectile N/V after eating  barbecue. He was dehydrated. Seen by cardiology and started on amiodarone and Eliquis. Plavix discontinued. He converted to NSR. DC on amiodarone 400 mg daily later reduced to 200 mg. Echo was unremarkable. On follow up Amiodarone was discontinued due to fatigue,weakness, N/V.  On follow up today he is seen with his sitter. Still is very weak. Has persistent nausea. Has lost 2 lbs. Has never had to use lasix. Denies any palpitations, chest pain. Notes when BP is taken his pulse is  Regular.  Gets winded  easily.   Current Outpatient Medications on File Prior to Visit  Medication Sig Dispense Refill  . acetaminophen (TYLENOL) 650 MG CR tablet Take 1,300 mg by mouth 2 (two) times daily.     [name] albuterol (VENTOLIN HFA) 108 (90 Base) MCG/ACT inhaler Inhale 2 puffs into the lungs every 6 (six) hours as needed for wheezing or shortness of breath. 18 g 1  . ANORO ELLIPTA 62.5-25 MCG/INH AEPB Inhale 1 puff into the lungs daily as needed (shortness of breath).     [name] apixaban (ELIQUIS) 5 MG TABS tablet Take 1 tablet (5 mg total) by mouth 2 (two) times daily. 180 tablet 1  . atorvastatin (LIPITOR) 10 MG tablet Take 1 tablet (10 mg total) by mouth daily. 90 tablet 3  . Cholecalciferol (VITAMIN D3) 50 MCG (2000 UT) capsule Take 2,000 Units by mouth every evening.    . Cyanocobalamin (VITAMIN B-12 IJ) Inject 1,000 mcg as directed every 30 (thirty) days.     . diclofenac sodium (VOLTAREN) 1 % GEL Apply 2 g topically 4 (four) times daily. (Patient taking differently: Apply 2 g topically 4 (four) times daily as needed (hip pain). ) 1 Tube 5  . DULoxetine (CYMBALTA) 30 MG capsule Take 30 mg by mouth every evening.    . fluticasone (FLONASE) 50 MCG/ACT nasal spray Place 1 spray into both nostrils 2 (two) times daily as needed for allergies or rhinitis.     . furosemide (LASIX) 20 MG tablet Take 1 tablet (20 mg total) by mouth daily  as needed for fluid or edema (or SOB or weight gain). 30 tablet 0  . Glucos-Chond-Hyal Ac-Ca Fructo (MOVE FREE JOINT HEALTH ADVANCE PO) Take 1 capsule by mouth 2 (two) times daily.    . hydrOXYzine (ATARAX) 25 MG tablet Take 25 mg by mouth at bedtime.     [name] levothyroxine (SYNTHROID, LEVOTHROID) 25 MCG tablet Take 25 mcg by mouth daily before breakfast.     . lisinopril (ZESTRIL) 20 MG tablet Take 1 tablet (20 mg total) by mouth daily. 90 tablet 3  . loperamide (IMODIUM A-D) 2 MG tablet Take 2-4 mg by mouth 4 (four) times daily as needed for diarrhea or loose stools.     . meclizine  (ANTIVERT) 25 MG tablet Take 25 mg by mouth 2 (two) times daily as needed for dizziness or nausea.     . metoprolol tartrate (LOPRESSOR) 50 MG tablet Take 1.5 tablets (75 mg total) by mouth 2 (two) times daily. 270 tablet 3  . mupirocin ointment (BACTROBAN) 2 % Apply 1 application topically 2 (two) times daily as needed (to affected areas- irritated hair follicles on face).     . nitroGLYCERIN (NITROSTAT) 0.4 MG SL tablet Place 1 tablet (0.4 mg total) under the tongue every 5 (five) minutes as needed for chest pain. 1 tablet 0  . ondansetron (ZOFRAN) 4 MG tablet Take 1 tablet (4 mg total) by mouth every 8 (eight) hours as needed for nausea or vomiting. 12 tablet 0  . pantoprazole (PROTONIX) 40 MG tablet Take 1 tablet (40 mg total) by mouth daily. NEED OV. (Patient taking differently: Take 40 mg by mouth daily before breakfast. ) 180 tablet 1  . Probiotic Product (PROBIOTIC-10 PO) Take 1 capsule by mouth daily.      No current facility-administered medications on file prior to visit.    No Known Allergies  Past Medical History:  Diagnosis Date  . Acromioclavicular joint arthritis   . Anemia   . B12 deficiency 10/15/11   "take injections q 28 days"  . Coronary artery disease    LHC 10/15/11: LAD stent patent with mid ectasia, distal LAD 50-60%, mid circumflex with ectasia, EF 55-65%.  . Dyslipidemia   . GERD (gastroesophageal reflux disease)   . Glucose intolerance (pre-diabetes)    Borderline glucose intolerance  . Hypertension   . Hypothyroidism   . Post concussive syndrome 2011   "for ~ 6-8 months"  . Rotator cuff tear   . Shortness of breath 10/15/11   "here lately I've been having it on & off any time"  . SVT (supraventricular tachycardia) ([place])    maintained on beta blocker    Past Surgical History:  Procedure Laterality Date  . BACK SURGERY    . CARDIAC CATHETERIZATION  12/19/08   NORMAL. EF 55%  . CARDIAC CATHETERIZATION  10/15/11  . CARDIOVASCULAR STRESS TEST  10/2010   . CARPAL TUNNEL RELEASE     bilaterally  . CHOLECYSTECTOMY  03/23/2019  . COLONOSCOPY W/ ENDOSCOPIC [place]    . CORONARY ANGIOPLASTY WITH STENT PLACEMENT  9/ 2005   . JOINT REPLACEMENT    . LAPAROSCOPIC CHOLECYSTECTOMY SINGLE SITE WITH INTRAOPERATIVE CHOLANGIOGRAM N/A 03/23/2019   Procedure: SINGLE SITE LAPAROSCOPIC CHOLECYSTECTOMY WITH INTRAOPERATIVE CHOLANGIOGRAM;  Surgeon: Michael Boston, MD;  Location: [facility];  Service: General;  Laterality: N/A;  . LEFT HEART CATH AND CORONARY ANGIOGRAPHY N/A 11/05/2018   Procedure: LEFT HEART CATH AND CORONARY ANGIOGRAPHY;  Surgeon: Martinique, Donaven Criswell M, MD;  Location: [facility];  Service: Cardiovascular;  Laterality: N/A;  . LEFT HEART CATHETERIZATION WITH CORONARY ANGIOGRAM N/A 10/15/2011   Procedure: LEFT HEART CATHETERIZATION WITH CORONARY ANGIOGRAM;  Surgeon: Sherren Mocha, MD;  Location: [facility] CATH LAB;  Service: Cardiovascular;  Laterality: N/A;  . LUMBAR LAMINECTOMY  1970's  . NASAL SINUS SURGERY     x2  . reconstructive shoulder surgery     right  . REPLACEMENT TOTAL KNEE  12/2007   left  . RESECTION TUMOR CLAVICLE RADICAL     Open distal clavicle resection  . SHOULDER SURGERY  02/11/2011   right  . TRIGGER FINGER RELEASE     ? both hands    Social History   Tobacco Use  Smoking Status Former Smoker  . Packs/day: 1.00  . Years: 21.00  . Pack years: 21.00  . Types: Cigarettes  . Quit date: 04/07/1976  . Years since quitting: 44.2  Smokeless Tobacco Never Used    Social History   Substance and Sexual Activity  Alcohol Use Not Currently   Comment: 10/15/11 "haven't drank in 15-20 years"    Family History  Problem Relation Age of Onset  . Stroke Mother   . Heart attack Father   . Diabetes Father   . Kidney failure Father     Review of Systems: As noted in history of present illness.  All other systems were reviewed and are negative.  Physical Exam: BP 129/72   Pulse 95   Ht 6' (1.829 m)   Wt 195 lb 12.8 oz (88.8 kg)    SpO2 95%   BMI 26.56 kg/m  GENERAL:  Well appearing WM in NAD HEENT:  PERRL, EOMI, sclera are clear. Oropharynx is clear. NECK:  No jugular venous distention, carotid upstroke brisk and symmetric, no bruits, no thyromegaly or adenopathy LUNGS:  Clear to auscultation bilaterally CHEST:  Unremarkable HEART:  RRR with occ extrasystoles. I get a pulse in the 70s,  PMI not displaced or sustained,S1 and S2 within normal limits, no S3, no S4: no clicks, no rubs, no murmurs ABD:  Soft, nontender. BS +, no masses or bruits. No hepatomegaly, no splenomegaly EXT:  2 + pulses throughout, no edema, no cyanosis no clubbing. No hematoma at cath site. SKIN:  Warm and dry.  No rashes NEURO:  Alert and oriented x 3. Cranial nerves II through XII intact. PSYCH:  Cognitively intact      LABORATORY DATA:  Lab Results  Component Value Date   WBC 11.4 (H) 01/26/2020   HGB 13.6 01/26/2020   HCT 41.5 01/26/2020   PLT 225 01/26/2020   GLUCOSE 91 01/27/2020   CHOL 133 10/30/2018   TRIG 142 10/30/2018   HDL 39 (L) 10/30/2018   LDLCALC 66 10/30/2018   ALT 14 01/25/2020   AST 20 01/25/2020   NA 137 01/27/2020   K 3.7 01/27/2020   CL 107 01/27/2020   CREATININE 1.12 01/27/2020   BUN 8 01/27/2020   CO2 21 (L) 01/27/2020   TSH 3.602 01/25/2020   INR 1.1 03/25/2019   HGBA1C 5.0 10/29/2018    Reviewed from primary care dated 05/14/16: cholesterol 141, triglycerides 174, HDL39, LDL 67. BUN 23, creatinine 1.5. Other chemistries, Hgb, and TSH were normal. Dated 07/01/19: cholesterol 144, triglycerides 92, HDL 49, LDL 77. A1c 5.3%.     Cardiac cath 11/05/18: Procedures   LEFT HEART CATH AND CORONARY ANGIOGRAPHY  Conclusion     Previously placed Prox LAD stent (unknown type) is widely patent.  Mid LAD lesion is 45%  stenosed.  LV end diastolic pressure is normal.   1. Nonobstructive CAD 2. Normal LVEDP  Plan: continue medical therapy. Will obtain an Echo as outpatient to assess LV function.  Consider alternative causes of chest pain.   Echo 01/26/20: IMPRESSIONS    1. Low normal LV systolic function; mild LVH; grade 1 diastolic  dysfunction.  2. Left ventricular ejection fraction, by estimation, is 50 to 55%. The  left ventricle has low normal function. The left ventricle has no regional  wall motion abnormalities. There is mild left ventricular hypertrophy.  Left ventricular diastolic  parameters are consistent with Grade I diastolic dysfunction (impaired  relaxation).  3. Right ventricular systolic function is normal. The right ventricular  size is normal. There is normal pulmonary artery systolic pressure.  4. The mitral valve is normal in structure. No evidence of mitral valve  regurgitation. No evidence of mitral stenosis.  5. The aortic valve is tricuspid. Aortic valve regurgitation is not  visualized. Mild aortic valve sclerosis is present, with no evidence of  aortic valve stenosis.  6. The inferior vena cava is normal in size with greater than 50%  respiratory variability, suggesting right atrial pressure of 3 mmHg.   Assessment / Plan: 1. Coronary disease. S/p remote stenting of the proximal LAD with Cypher stent in 2005.  Cardiac catheterization 11/05/18  showed nonobstructive disease. Recommend stopping ASA since he is on Eliquis.   2. Dyslipidemia LDL at goal. Continue statin. Labs followed by Dr Reynaldo Minium.   3. Hypertension. Well controlled.   4. SVT controlled on metoprolol  5. S/p lap choly in [place] 2020  6. Atrial fibrillation. Triggered by stress of acute food poisoning. Converted to NSR on amiodarone. [place] vasc score of 3-4. On anticoagulation with Eliquis. Amiodarone previously stopped due to fatigue and nausea which did not improve. It appears he is maintaining NSR.    Follow up in 6 months.

## 2020-06-11 ENCOUNTER — Other Ambulatory Visit: Payer: Self-pay

## 2020-06-11 ENCOUNTER — Encounter: Payer: Self-pay | Admitting: Cardiology

## 2020-06-11 ENCOUNTER — Ambulatory Visit (INDEPENDENT_AMBULATORY_CARE_PROVIDER_SITE_OTHER): Payer: Medicare Other | Admitting: Cardiology

## 2020-06-11 VITALS — BP 129/72 | HR 95 | Ht 72.0 in | Wt 195.8 lb

## 2020-06-11 DIAGNOSIS — I25118 Atherosclerotic heart disease of native coronary artery with other forms of angina pectoris: Secondary | ICD-10-CM | POA: Diagnosis not present

## 2020-06-11 DIAGNOSIS — I25110 Atherosclerotic heart disease of native coronary artery with unstable angina pectoris: Secondary | ICD-10-CM | POA: Diagnosis not present

## 2020-06-11 DIAGNOSIS — I10 Essential (primary) hypertension: Secondary | ICD-10-CM

## 2020-06-11 DIAGNOSIS — I471 Supraventricular tachycardia: Secondary | ICD-10-CM

## 2020-06-11 DIAGNOSIS — I4891 Unspecified atrial fibrillation: Secondary | ICD-10-CM

## 2020-06-11 DIAGNOSIS — E7800 Pure hypercholesterolemia, unspecified: Secondary | ICD-10-CM

## 2020-06-11 NOTE — Patient Instructions (Signed)
Stop taking ASA. Continue your other therapy  

## 2020-06-14 DIAGNOSIS — D510 Vitamin B12 deficiency anemia due to intrinsic factor deficiency: Secondary | ICD-10-CM | POA: Diagnosis not present

## 2020-06-29 DIAGNOSIS — I10 Essential (primary) hypertension: Secondary | ICD-10-CM | POA: Diagnosis not present

## 2020-06-29 DIAGNOSIS — E039 Hypothyroidism, unspecified: Secondary | ICD-10-CM | POA: Diagnosis not present

## 2020-06-29 DIAGNOSIS — D510 Vitamin B12 deficiency anemia due to intrinsic factor deficiency: Secondary | ICD-10-CM | POA: Diagnosis not present

## 2020-06-29 DIAGNOSIS — R7301 Impaired fasting glucose: Secondary | ICD-10-CM | POA: Diagnosis not present

## 2020-06-29 DIAGNOSIS — Z125 Encounter for screening for malignant neoplasm of prostate: Secondary | ICD-10-CM | POA: Diagnosis not present

## 2020-07-05 DIAGNOSIS — K219 Gastro-esophageal reflux disease without esophagitis: Secondary | ICD-10-CM | POA: Diagnosis not present

## 2020-07-05 DIAGNOSIS — I10 Essential (primary) hypertension: Secondary | ICD-10-CM | POA: Diagnosis not present

## 2020-07-05 DIAGNOSIS — I2510 Atherosclerotic heart disease of native coronary artery without angina pectoris: Secondary | ICD-10-CM | POA: Diagnosis not present

## 2020-07-05 DIAGNOSIS — E039 Hypothyroidism, unspecified: Secondary | ICD-10-CM | POA: Diagnosis not present

## 2020-07-05 DIAGNOSIS — E663 Overweight: Secondary | ICD-10-CM | POA: Diagnosis not present

## 2020-07-05 DIAGNOSIS — R7301 Impaired fasting glucose: Secondary | ICD-10-CM | POA: Diagnosis not present

## 2020-07-05 DIAGNOSIS — Z0000 Encounter for general adult medical examination without abnormal findings: Secondary | ICD-10-CM | POA: Diagnosis not present

## 2020-07-05 DIAGNOSIS — R82998 Other abnormal findings in urine: Secondary | ICD-10-CM | POA: Diagnosis not present

## 2020-07-20 DIAGNOSIS — Z1212 Encounter for screening for malignant neoplasm of rectum: Secondary | ICD-10-CM | POA: Diagnosis not present

## 2020-08-02 DIAGNOSIS — D510 Vitamin B12 deficiency anemia due to intrinsic factor deficiency: Secondary | ICD-10-CM | POA: Diagnosis not present

## 2020-08-08 DIAGNOSIS — Z23 Encounter for immunization: Secondary | ICD-10-CM | POA: Diagnosis not present

## 2020-08-23 ENCOUNTER — Other Ambulatory Visit: Payer: Self-pay | Admitting: Cardiology

## 2020-09-11 DIAGNOSIS — D510 Vitamin B12 deficiency anemia due to intrinsic factor deficiency: Secondary | ICD-10-CM | POA: Diagnosis not present

## 2020-10-02 DIAGNOSIS — M1711 Unilateral primary osteoarthritis, right knee: Secondary | ICD-10-CM | POA: Diagnosis not present

## 2020-10-02 DIAGNOSIS — M25461 Effusion, right knee: Secondary | ICD-10-CM | POA: Diagnosis not present

## 2020-10-02 DIAGNOSIS — M25561 Pain in right knee: Secondary | ICD-10-CM | POA: Diagnosis not present

## 2020-10-16 DIAGNOSIS — M25561 Pain in right knee: Secondary | ICD-10-CM | POA: Diagnosis not present

## 2020-11-01 DIAGNOSIS — D510 Vitamin B12 deficiency anemia due to intrinsic factor deficiency: Secondary | ICD-10-CM | POA: Diagnosis not present

## 2020-11-11 DIAGNOSIS — Z23 Encounter for immunization: Secondary | ICD-10-CM | POA: Diagnosis not present

## 2020-11-28 DIAGNOSIS — M25552 Pain in left hip: Secondary | ICD-10-CM | POA: Diagnosis not present

## 2020-11-28 DIAGNOSIS — M25551 Pain in right hip: Secondary | ICD-10-CM | POA: Diagnosis not present

## 2020-11-30 IMAGING — CT CT ABD-PELV W/O CM
2 of 4 series · 16 of 46 positions shown, 18 images · non-contrast
Comparison: CT scan dated 10/05/2019

CLINICAL DATA: Nausea and vomiting.

EXAM:
CT ABDOMEN AND PELVIS WITHOUT CONTRAST
TECHNIQUE: Multidetector CT imaging of the abdomen and pelvis was performed
following the standard protocol without IV contrast.

[Series 2: abd/ pelvis 5.0 i30f 2 · axial · 0.83mm/px · z∈[+754,+1259]mm · 13 of 111 slices shown, 15 images]
[im 5/111  soft-tissue]
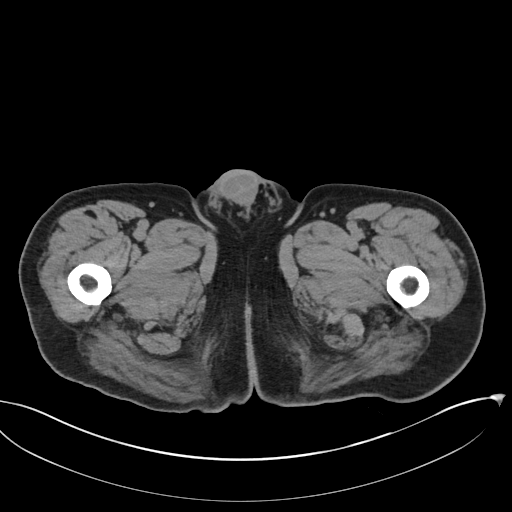
[im 5/111  bone]
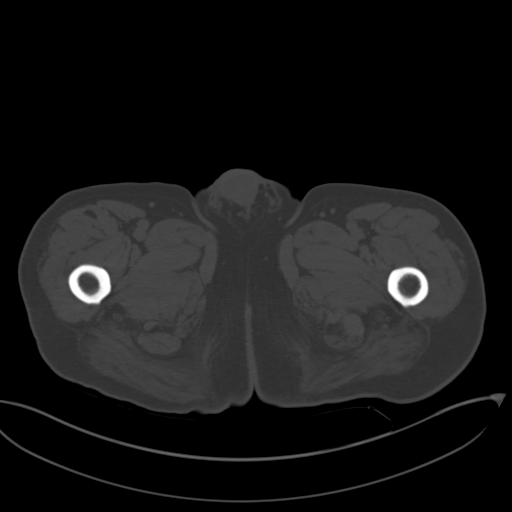
[im 14/111  soft-tissue]
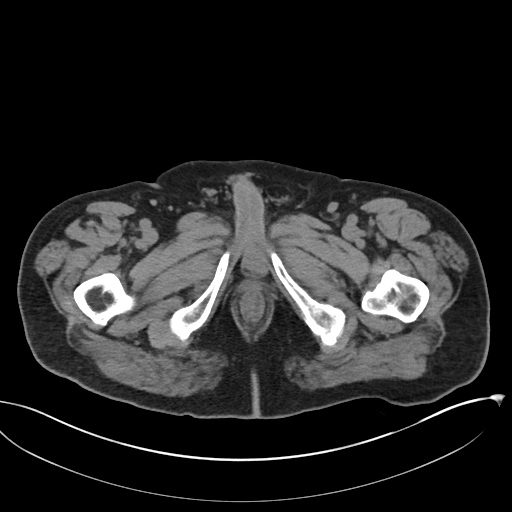
[im 23/111  soft-tissue]
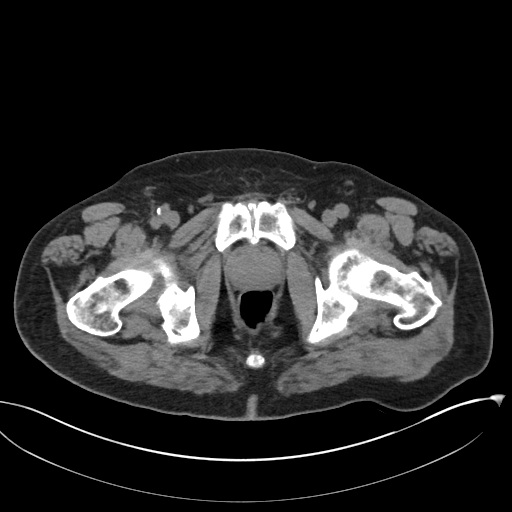
[im 31/111  soft-tissue]
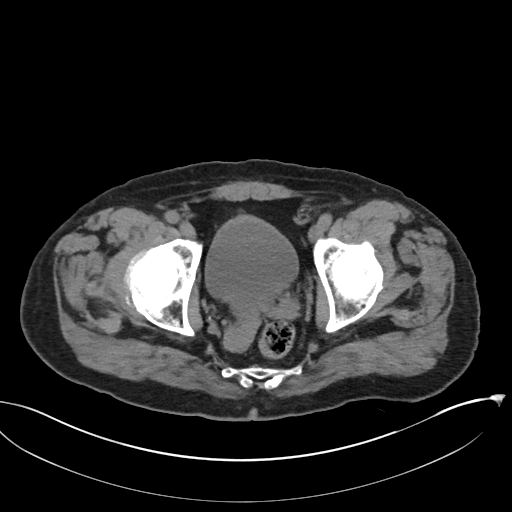
[im 40/111  soft-tissue]
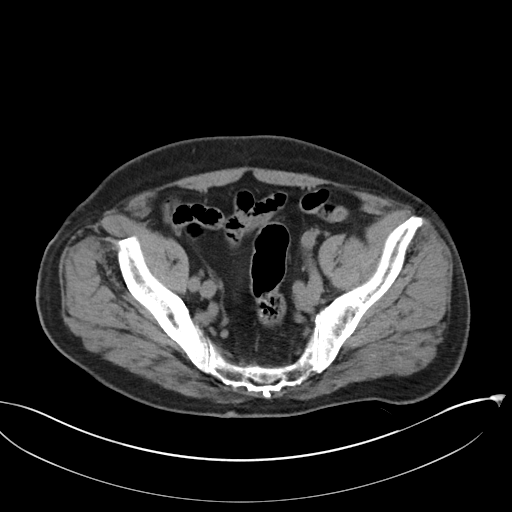
[im 49/111  soft-tissue]
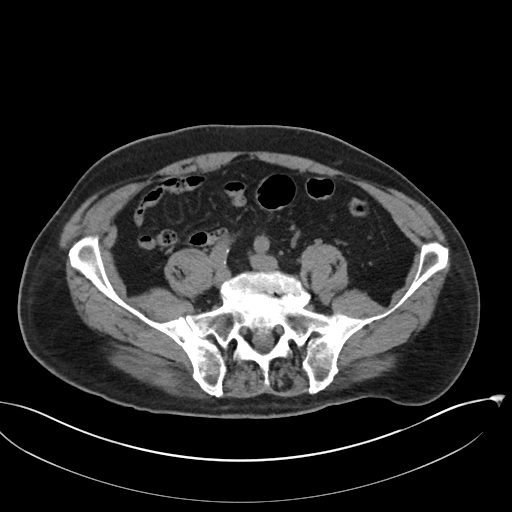
[im 58/111  soft-tissue]
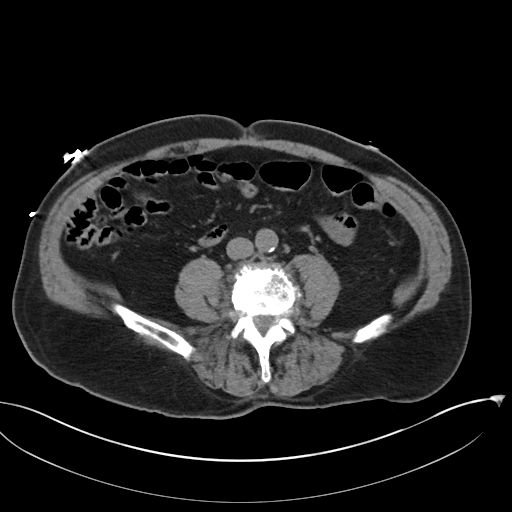
[im 62/111  soft-tissue]
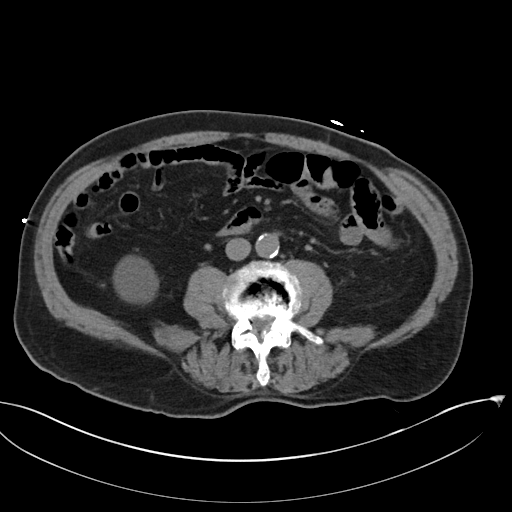
[im 71/111  soft-tissue]
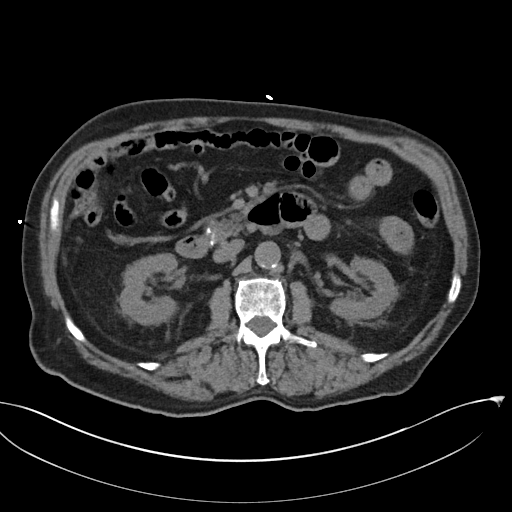
[im 71/111  bone]
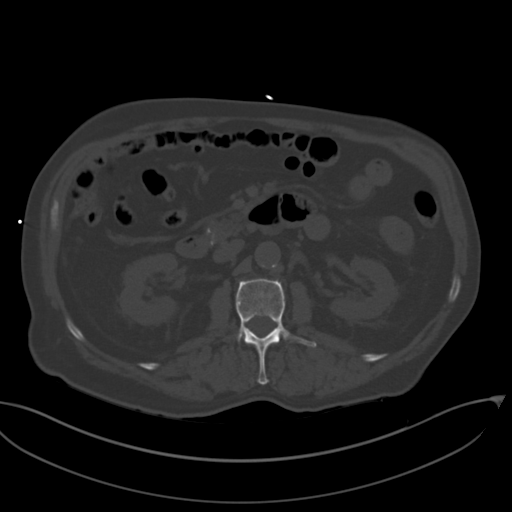
[im 80/111  soft-tissue]
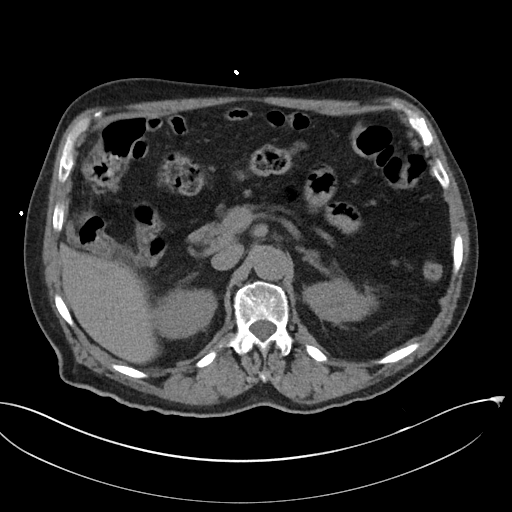
[im 89/111  soft-tissue]
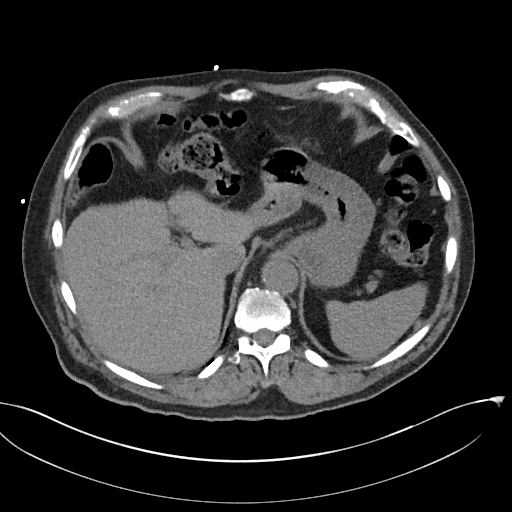
[im 97/111  soft-tissue]
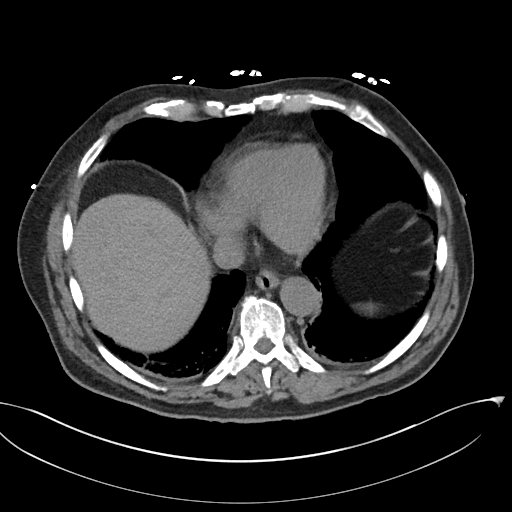
[im 106/111  soft-tissue]
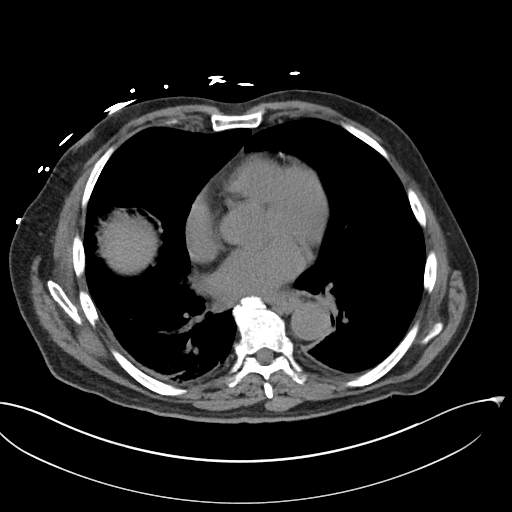

[Series 4: cor st · coronal · 0.94mm/px · 3 of 106 slices shown]
[im 36/106  soft-tissue]
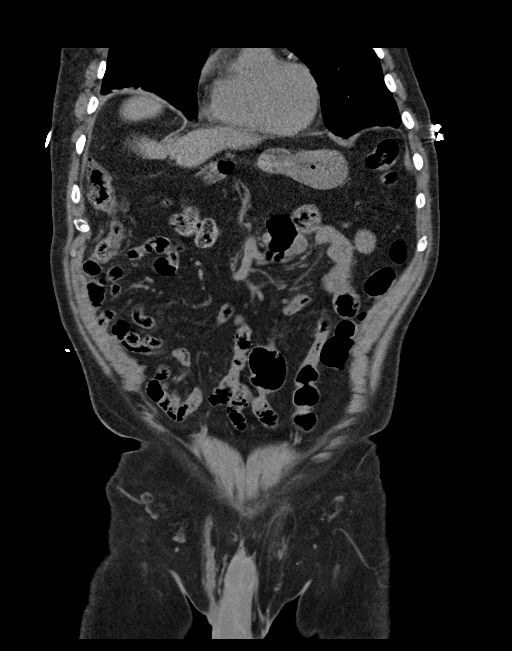
[im 47/106  soft-tissue]
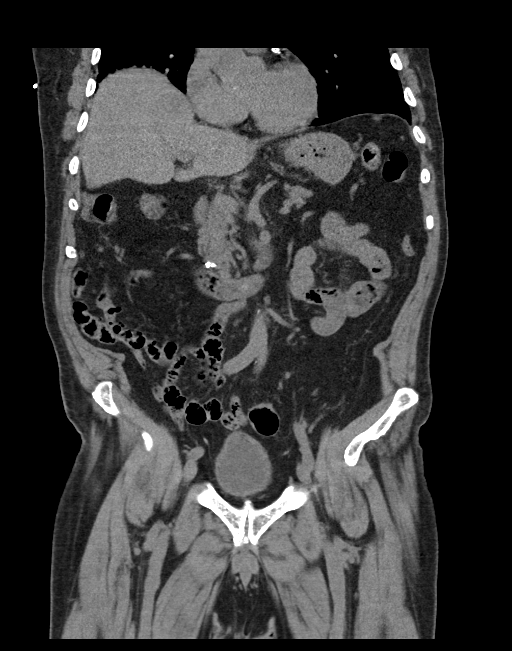
[im 59/106  soft-tissue]
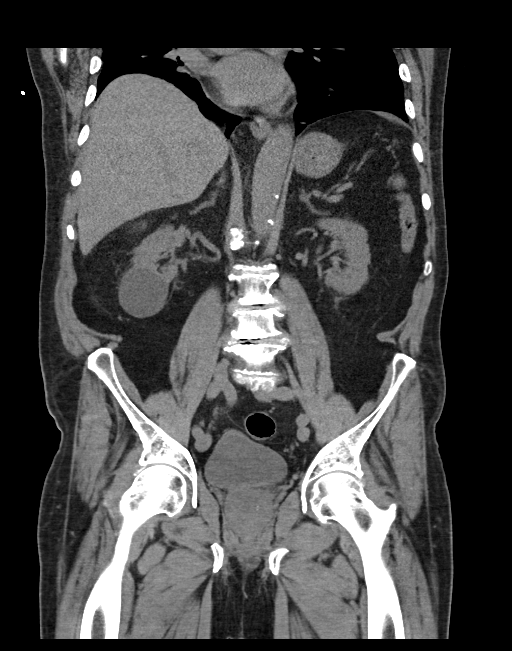

[16 of 46 positions shown; findings below may reference images not displayed]

FINDINGS: Lower chest: No acute abnormalities.

Hepatobiliary: No focal liver abnormality is seen. Status post
cholecystectomy. No biliary dilatation.

Pancreas: There are few chronic calcifications in the uncinate
process of the pancreas, stable. The pancreas is otherwise normal.

Spleen: Normal in size without focal abnormality.

Adrenals/Urinary Tract: Adrenal glands are normal. Kidneys are
normal except for a benign-appearing 5.3 cm cyst on the lower pole
of the right kidney. Left kidney is normal. No hydronephrosis.
Bladder is normal.

Stomach/Bowel: Stomach is within normal limits. Appendix appears
normal. No evidence of bowel wall thickening, distention, or
inflammatory changes. The cecum lies in the right mid abdomen as
does the appendix.

Vascular/Lymphatic: Aortic atherosclerosis. No enlarged abdominal or
pelvic lymph nodes.

Reproductive: Prostate is unremarkable.

Other: No abdominal wall hernia or abnormality. No abdominopelvic
ascites.

Musculoskeletal: No acute abnormality. Severe degenerative disc
disease in the lower lumbar spine.
IMPRESSION: Benign-appearing abdomen and pelvis.

Aortic Atherosclerosis ([id number]).

## 2020-12-12 DIAGNOSIS — M25551 Pain in right hip: Secondary | ICD-10-CM | POA: Diagnosis not present

## 2020-12-12 DIAGNOSIS — M25552 Pain in left hip: Secondary | ICD-10-CM | POA: Diagnosis not present

## 2020-12-12 DIAGNOSIS — M25561 Pain in right knee: Secondary | ICD-10-CM | POA: Diagnosis not present

## 2020-12-18 ENCOUNTER — Emergency Department (HOSPITAL_COMMUNITY)
Admission: EM | Admit: 2020-12-18 | Discharge: 2020-12-19 | Disposition: A | Discharge status: Home / Self Care | Payer: Medicare Other | Attending: Emergency Medicine | Admitting: Emergency Medicine

## 2020-12-18 ENCOUNTER — Other Ambulatory Visit: Payer: Self-pay

## 2020-12-18 ENCOUNTER — Emergency Department (HOSPITAL_COMMUNITY): Payer: Medicare Other

## 2020-12-18 DIAGNOSIS — G8929 Other chronic pain: Secondary | ICD-10-CM | POA: Diagnosis not present

## 2020-12-18 DIAGNOSIS — S0003XA Contusion of scalp, initial encounter: Secondary | ICD-10-CM | POA: Diagnosis not present

## 2020-12-18 DIAGNOSIS — I10 Essential (primary) hypertension: Secondary | ICD-10-CM | POA: Diagnosis not present

## 2020-12-18 DIAGNOSIS — M25551 Pain in right hip: Secondary | ICD-10-CM | POA: Insufficient documentation

## 2020-12-18 DIAGNOSIS — R1111 Vomiting without nausea: Secondary | ICD-10-CM | POA: Diagnosis not present

## 2020-12-18 DIAGNOSIS — S199XXA Unspecified injury of neck, initial encounter: Secondary | ICD-10-CM | POA: Diagnosis not present

## 2020-12-18 DIAGNOSIS — W19XXXA Unspecified fall, initial encounter: Secondary | ICD-10-CM | POA: Diagnosis not present

## 2020-12-18 DIAGNOSIS — D510 Vitamin B12 deficiency anemia due to intrinsic factor deficiency: Secondary | ICD-10-CM | POA: Diagnosis not present

## 2020-12-18 DIAGNOSIS — Z79899 Other long term (current) drug therapy: Secondary | ICD-10-CM | POA: Insufficient documentation

## 2020-12-18 DIAGNOSIS — W1830XA Fall on same level, unspecified, initial encounter: Secondary | ICD-10-CM | POA: Insufficient documentation

## 2020-12-18 DIAGNOSIS — M47816 Spondylosis without myelopathy or radiculopathy, lumbar region: Secondary | ICD-10-CM | POA: Diagnosis not present

## 2020-12-18 DIAGNOSIS — R001 Bradycardia, unspecified: Secondary | ICD-10-CM | POA: Diagnosis not present

## 2020-12-18 DIAGNOSIS — S7001XA Contusion of right hip, initial encounter: Secondary | ICD-10-CM | POA: Diagnosis not present

## 2020-12-18 DIAGNOSIS — M79604 Pain in right leg: Secondary | ICD-10-CM | POA: Diagnosis not present

## 2020-12-18 DIAGNOSIS — M76891 Other specified enthesopathies of right lower limb, excluding foot: Secondary | ICD-10-CM | POA: Diagnosis not present

## 2020-12-18 DIAGNOSIS — S0093XA Contusion of unspecified part of head, initial encounter: Secondary | ICD-10-CM | POA: Diagnosis not present

## 2020-12-18 DIAGNOSIS — Z87891 Personal history of nicotine dependence: Secondary | ICD-10-CM | POA: Diagnosis not present

## 2020-12-18 DIAGNOSIS — I2510 Atherosclerotic heart disease of native coronary artery without angina pectoris: Secondary | ICD-10-CM | POA: Diagnosis not present

## 2020-12-18 DIAGNOSIS — E039 Hypothyroidism, unspecified: Secondary | ICD-10-CM | POA: Diagnosis not present

## 2020-12-18 DIAGNOSIS — Z951 Presence of aortocoronary bypass graft: Secondary | ICD-10-CM | POA: Insufficient documentation

## 2020-12-18 DIAGNOSIS — Z7901 Long term (current) use of anticoagulants: Secondary | ICD-10-CM | POA: Insufficient documentation

## 2020-12-18 DIAGNOSIS — J9811 Atelectasis: Secondary | ICD-10-CM | POA: Diagnosis not present

## 2020-12-18 DIAGNOSIS — M1611 Unilateral primary osteoarthritis, right hip: Secondary | ICD-10-CM | POA: Diagnosis not present

## 2020-12-18 DIAGNOSIS — M533 Sacrococcygeal disorders, not elsewhere classified: Secondary | ICD-10-CM | POA: Diagnosis not present

## 2020-12-18 DIAGNOSIS — Z96652 Presence of left artificial knee joint: Secondary | ICD-10-CM | POA: Insufficient documentation

## 2020-12-18 DIAGNOSIS — R110 Nausea: Secondary | ICD-10-CM | POA: Diagnosis not present

## 2020-12-18 DIAGNOSIS — S0990XA Unspecified injury of head, initial encounter: Secondary | ICD-10-CM | POA: Diagnosis not present

## 2020-12-18 DIAGNOSIS — I6782 Cerebral ischemia: Secondary | ICD-10-CM | POA: Diagnosis not present

## 2020-12-18 LAB — CBC WITH DIFFERENTIAL/PLATELET
Abs Immature Granulocytes: 0.14 10*3/uL — ABNORMAL HIGH (ref 0.00–0.07)
Basophils Absolute: 0.1 10*3/uL (ref 0.0–0.1)
Basophils Relative: 0 %
Eosinophils Absolute: 0.1 10*3/uL (ref 0.0–0.5)
Eosinophils Relative: 1 %
HCT: 48.7 % (ref 39.0–52.0)
Hemoglobin: 15.1 g/dL (ref 13.0–17.0)
Immature Granulocytes: 1 %
Lymphocytes Relative: 15 %
Lymphs Abs: 2.2 10*3/uL (ref 0.7–4.0)
MCH: 28.4 pg (ref 26.0–34.0)
MCHC: 31 g/dL (ref 30.0–36.0)
MCV: 91.5 fL (ref 80.0–100.0)
Monocytes Absolute: 1.5 10*3/uL — ABNORMAL HIGH (ref 0.1–1.0)
Monocytes Relative: 10 %
Neutro Abs: 10.6 10*3/uL — ABNORMAL HIGH (ref 1.7–7.7)
Neutrophils Relative %: 73 %
Platelets: 261 10*3/uL (ref 150–400)
RBC: 5.32 MIL/uL (ref 4.22–5.81)
RDW: 15.6 % — ABNORMAL HIGH (ref 11.5–15.5)
WBC: 14.6 10*3/uL — ABNORMAL HIGH (ref 4.0–10.5)
nRBC: 0 % (ref 0.0–0.2)

## 2020-12-18 LAB — I-STAT CHEM 8, ED
BUN: 22 mg/dL (ref 8–23)
Calcium, Ion: 1.05 mmol/L — ABNORMAL LOW (ref 1.15–1.40)
Chloride: 106 mmol/L (ref 98–111)
Creatinine, Ser: 1.2 mg/dL (ref 0.61–1.24)
Glucose, Bld: 109 mg/dL — ABNORMAL HIGH (ref 70–99)
HCT: 47 % (ref 39.0–52.0)
Hemoglobin: 16 g/dL (ref 13.0–17.0)
Potassium: 3.7 mmol/L (ref 3.5–5.1)
Sodium: 137 mmol/L (ref 135–145)
TCO2: 21 mmol/L — ABNORMAL LOW (ref 22–32)

## 2020-12-18 LAB — COMPREHENSIVE METABOLIC PANEL
ALT: 22 U/L (ref 0–44)
AST: 18 U/L (ref 15–41)
Albumin: 3.7 g/dL (ref 3.5–5.0)
Alkaline Phosphatase: 53 U/L (ref 38–126)
Anion gap: 13 (ref 5–15)
BUN: 20 mg/dL (ref 8–23)
CO2: 21 mmol/L — ABNORMAL LOW (ref 22–32)
Calcium: 9.4 mg/dL (ref 8.9–10.3)
Chloride: 104 mmol/L (ref 98–111)
Creatinine, Ser: 1.37 mg/dL — ABNORMAL HIGH (ref 0.61–1.24)
GFR, Estimated: 52 mL/min — ABNORMAL LOW (ref >60)
Glucose, Bld: 112 mg/dL — ABNORMAL HIGH (ref 70–99)
Potassium: 3.8 mmol/L (ref 3.5–5.1)
Sodium: 138 mmol/L (ref 135–145)
Total Bilirubin: 0.9 mg/dL (ref 0.3–1.2)
Total Protein: 6.4 g/dL — ABNORMAL LOW (ref 6.5–8.1)

## 2020-12-18 LAB — LIPASE, BLOOD: Lipase: 35 U/L (ref 11–51)

## 2020-12-18 LAB — TYPE AND SCREEN
ABO/RH(D): O POS
Antibody Screen: NEGATIVE

## 2020-12-18 NOTE — ED Triage Notes (Signed)
Pt arrived via [facility], level 2 trauma for ground level fall on blood thinners. Pt A&Ox4, coming from home, lost balance using walker falling backward hitting right side of head on ground. No deformity, injury, or tenderness noted in area. Pt denies any injury from fall, but reports chronic hip pain from previous fall and nausea/vomiting that began yesterday. PMH of afib.

## 2020-12-18 NOTE — ED Notes (Signed)
Daughter, Amy at bedside, reports nausea/vomiting began after receiving B12 shot today.

## 2020-12-18 NOTE — ED Provider Notes (Signed)
[facility] EMERGENCY DEPARTMENT Provider Note  CSN: [id number] Arrival date & time: 12/18/20 2131    History Chief Complaint  Patient presents with  . Fall    HPI  Johnny [name] is a 80 y.o. male brought from home for evaluation of fall. Patient reports he lost his balance using his walker and fell backwards hitting his head on the ground. He is on blood thinners. He denies LOC. Complaining of some R hip pain, states he has chronic pain there but worse since the fall. No neck pain. Patient reports he vomited several times after coming home from his doctor's office where he had a B12 shot. He has never had this kind of reaction to B12 injection before. Not nauseated at this time.    Past Medical History:  Diagnosis Date  . Acromioclavicular joint arthritis   . Anemia   . B12 deficiency 10/15/11   "take injections q 28 days"  . Coronary artery disease    LHC 10/15/11: LAD stent patent with mid ectasia, distal LAD 50-60%, mid circumflex with ectasia, EF 55-65%.  . Dyslipidemia   . GERD (gastroesophageal reflux disease)   . Glucose intolerance (pre-diabetes)    Borderline glucose intolerance  . Hypertension   . Hypothyroidism   . Post concussive syndrome 2011   "for ~ 6-8 months"  . Rotator cuff tear   . Shortness of breath 10/15/11   "here lately I've been having it on & off any time"  . SVT (supraventricular tachycardia) ([place])    maintained on beta blocker    Past Surgical History:  Procedure Laterality Date  . BACK SURGERY    . CARDIAC CATHETERIZATION  12/19/08   NORMAL. EF 55%  . CARDIAC CATHETERIZATION  10/15/11  . CARDIOVASCULAR STRESS TEST  10/2010  . CARPAL TUNNEL RELEASE     bilaterally  . CHOLECYSTECTOMY  03/23/2019  . COLONOSCOPY W/ ENDOSCOPIC [place]    . CORONARY ANGIOPLASTY WITH STENT PLACEMENT  9/ 2005   . JOINT REPLACEMENT    . LAPAROSCOPIC CHOLECYSTECTOMY SINGLE SITE WITH INTRAOPERATIVE CHOLANGIOGRAM N/A 03/23/2019   Procedure: SINGLE SITE LAPAROSCOPIC  CHOLECYSTECTOMY WITH INTRAOPERATIVE CHOLANGIOGRAM;  Surgeon: Michael Boston, MD;  Location: [facility];  Service: General;  Laterality: N/A;  . LEFT HEART CATH AND CORONARY ANGIOGRAPHY N/A 11/05/2018   Procedure: LEFT HEART CATH AND CORONARY ANGIOGRAPHY;  Surgeon: Martinique, Peter M, MD;  Location: [facility] CV LAB;  Service: Cardiovascular;  Laterality: N/A;  . LEFT HEART CATHETERIZATION WITH CORONARY ANGIOGRAM N/A 10/15/2011   Procedure: LEFT HEART CATHETERIZATION WITH CORONARY ANGIOGRAM;  Surgeon: Sherren Mocha, MD;  Location: [facility] CATH LAB;  Service: Cardiovascular;  Laterality: N/A;  . LUMBAR LAMINECTOMY  1970's  . NASAL SINUS SURGERY     x2  . reconstructive shoulder surgery     right  . REPLACEMENT TOTAL KNEE  12/2007   left  . RESECTION TUMOR CLAVICLE RADICAL     Open distal clavicle resection  . SHOULDER SURGERY  02/11/2011   right  . TRIGGER FINGER RELEASE     ? both hands    Family History  Problem Relation Age of Onset  . Stroke Mother   . Heart attack Father   . Diabetes Father   . Kidney failure Father     Social History   Tobacco Use  . Smoking status: Former Smoker    Packs/day: 1.00    Years: 21.00    Pack years: 21.00    Types: Cigarettes    Quit  date: 04/07/1976    Years since quitting: 44.7  . Smokeless tobacco: Never Used  Vaping Use  . Vaping Use: Never used  Substance Use Topics  . Alcohol use: Not Currently    Comment: 10/15/11 "haven't drank in 15-20 years"  . Drug use: No     Home Medications Prior to Admission medications   Medication Sig Start Date End Date Taking? Authorizing Provider  acetaminophen (TYLENOL) 650 MG CR tablet Take 1,300 mg by mouth 2 (two) times daily.     [provider]  albuterol (VENTOLIN HFA) 108 (90 Base) MCG/ACT inhaler Inhale 2 puffs into the lungs every 6 (six) hours as needed for wheezing or shortness of breath. 01/27/20   Mercy Riding, MD  ANORO ELLIPTA 62.5-25 MCG/INH AEPB Inhale 1 puff into the lungs daily as  needed (shortness of breath).  01/06/19   [provider]  atorvastatin (LIPITOR) 10 MG tablet Take 1 tablet (10 mg total) by mouth daily. 01/02/20   Martinique, Peter M, MD  Cholecalciferol (VITAMIN D3) 50 MCG (2000 UT) capsule Take 2,000 Units by mouth every evening.    [provider]  Cyanocobalamin (VITAMIN B-12 IJ) Inject 1,000 mcg as directed every 30 (thirty) days.     [provider]  diclofenac sodium (VOLTAREN) 1 % GEL Apply 2 g topically 4 (four) times daily. Patient taking differently: Apply 2 g topically 4 (four) times daily as needed (hip pain).  08/13/18   Aundra Dubin, PA-C  DULoxetine (CYMBALTA) 30 MG capsule Take 30 mg by mouth every evening.    [provider]  ELIQUIS 5 MG TABS tablet TAKE 1 TABLET BY MOUTH TWICE A DAY 08/23/20   Martinique, Peter M, MD  fluticasone [facility]) 50 MCG/ACT nasal spray Place 1 spray into both nostrils 2 (two) times daily as needed for allergies or rhinitis.  01/12/20   [provider]  furosemide (LASIX) 20 MG tablet Take 1 tablet (20 mg total) by mouth daily as needed for fluid or edema (or SOB or weight gain). 03/27/19   Mikhail, Velta Addison, DO  Glucos-Chond-Hyal Ac-Ca Fructo (MOVE FREE JOINT HEALTH ADVANCE PO) Take 1 capsule by mouth 2 (two) times daily.    [provider]  hydrOXYzine (ATARAX) 25 MG tablet Take 25 mg by mouth at bedtime.     [provider]  levothyroxine (SYNTHROID, LEVOTHROID) 25 MCG tablet Take 25 mcg by mouth daily before breakfast.     [provider]  lisinopril (ZESTRIL) 20 MG tablet Take 1 tablet (20 mg total) by mouth daily. 01/02/20 12/27/20  Martinique, Peter M, MD  loperamide (IMODIUM A-D) 2 MG tablet Take 2-4 mg by mouth 4 (four) times daily as needed for diarrhea or loose stools.     [provider]  meclizine (ANTIVERT) 25 MG tablet Take 25 mg by mouth 2 (two) times daily as needed for dizziness or nausea.     [provider]  metoprolol tartrate  (LOPRESSOR) 50 MG tablet Take 1.5 tablets (75 mg total) by mouth 2 (two) times daily. 05/28/20 05/28/21  Martinique, Peter M, MD  mupirocin ointment (BACTROBAN) 2 % Apply 1 application topically 2 (two) times daily as needed (to affected areas- irritated hair follicles on face).  08/15/19   [provider]  nitroGLYCERIN (NITROSTAT) 0.4 MG SL tablet Place 1 tablet (0.4 mg total) under the tongue every 5 (five) minutes as needed for chest pain. 10/15/11   Larey Dresser, MD  ondansetron (ZOFRAN) 4 MG  tablet Take 1 tablet (4 mg total) by mouth every 8 (eight) hours as needed for nausea or vomiting. 01/20/19   Mesner, Corene Cornea, MD  pantoprazole (PROTONIX) 40 MG tablet Take 1 tablet (40 mg total) by mouth daily. NEED OV. Patient taking differently: Take 40 mg by mouth daily before breakfast.  11/25/18   Martinique, Peter M, MD  Probiotic Product (PROBIOTIC-10 PO) Take 1 capsule by mouth daily.     [provider]     Allergies    Patient has no known allergies.   Review of Systems   Review of Systems A comprehensive review of systems was completed and negative except as noted in HPI.    Physical Exam BP (!) 163/70   Pulse 76   Temp (!) 97.4 F (36.3 C) (Oral)   Resp 15   SpO2 95%   Physical Exam Vitals and nursing note reviewed.  Constitutional:      Appearance: Normal appearance.  HENT:     Head: Normocephalic.     Comments: L posterior hematoma    Nose: Nose normal.     Mouth/Throat:     Mouth: Mucous membranes are moist.  Eyes:     Extraocular Movements: Extraocular movements intact.     Conjunctiva/sclera: Conjunctivae normal.  Cardiovascular:     Rate and Rhythm: Normal rate.  Pulmonary:     Effort: Pulmonary effort is normal.     Breath sounds: Normal breath sounds.  Abdominal:     General: Abdomen is flat.     Palpations: Abdomen is soft.     Tenderness: There is no abdominal tenderness.  Musculoskeletal:        General: No swelling. Normal range of motion.      Cervical back: Neck supple. No tenderness.     Comments: R leg is shorter and externally rotated, some decreased ROM of that side with rotation but no significant pain, NVI  Skin:    General: Skin is warm and dry.  Neurological:     General: No focal deficit present.     Mental Status: He is alert.  Psychiatric:        Mood and Affect: Mood normal.      ED Results / Procedures / Treatments   Labs (all labs ordered are listed, but only abnormal results are displayed) Labs Reviewed  COMPREHENSIVE METABOLIC PANEL - Abnormal; Notable for the following components:      Result Value   CO2 21 (*)    Glucose, Bld 112 (*)    Creatinine, Ser 1.37 (*)    Total Protein 6.4 (*)    GFR, Estimated 52 (*)    All other components within normal limits  CBC WITH DIFFERENTIAL/PLATELET - Abnormal; Notable for the following components:   WBC 14.6 (*)    RDW 15.6 (*)    Neutro Abs 10.6 (*)    Monocytes Absolute 1.5 (*)    Abs Immature Granulocytes 0.14 (*)    All other components within normal limits  I-STAT CHEM 8, ED - Abnormal; Notable for the following components:   Glucose, Bld 109 (*)    Calcium, Ion 1.05 (*)    TCO2 21 (*)    All other components within normal limits  LIPASE, BLOOD  URINALYSIS, ROUTINE W REFLEX MICROSCOPIC  TYPE AND SCREEN    EKG EKG Interpretation  Date/Time:  Tuesday December 18 2020 21:33:51 EST Ventricular Rate:  91 PR Interval:    QRS Duration: 121 QT Interval:  376 QTC Calculation:  463 R Axis:   -23 Text Interpretation: Atrial fibrillation Ventricular premature complex Left bundle branch block Since last tracing Atrial fibrillation has replaced Sinus bradycardia Confirmed by Calvert Cantor [phone]) on 12/18/2020 10:23:17 PM   Radiology No results found.  Procedures Procedures  Medications Ordered in the ED Medications - No data to display   MDM Rules/Calculators/A&P MDM  ED Course  I have reviewed the triage vital signs and the nursing  notes.  Pertinent labs & imaging results that were available during my care of the patient were reviewed by me and considered in my medical decision making (see chart for details).  Clinical Course as of 12/19/20 0801  Tue Dec 18, 2020  2236 CBC with mild leukocytosis. CMP is unremarkable.  [CS]  8343 Hip xray neg for fracture. CXR is neg  [CS]  2324 Care of the patient signed out to Dr. Betsey Holiday at the change of shift pending CT.  [CS]    Clinical Course User Index [CS] Truddie Hidden, MD    Final Clinical Impression(s) / ED Diagnoses Final diagnoses:  Fall    Rx / DC Orders ED Discharge Orders    None       Truddie Hidden, MD 12/19/20 [phone]

## 2020-12-19 ENCOUNTER — Emergency Department (HOSPITAL_COMMUNITY): Payer: Medicare Other

## 2020-12-19 DIAGNOSIS — M79604 Pain in right leg: Secondary | ICD-10-CM | POA: Diagnosis not present

## 2020-12-19 DIAGNOSIS — M76891 Other specified enthesopathies of right lower limb, excluding foot: Secondary | ICD-10-CM | POA: Diagnosis not present

## 2020-12-19 DIAGNOSIS — M533 Sacrococcygeal disorders, not elsewhere classified: Secondary | ICD-10-CM | POA: Diagnosis not present

## 2020-12-19 DIAGNOSIS — G8929 Other chronic pain: Secondary | ICD-10-CM | POA: Diagnosis not present

## 2020-12-19 DIAGNOSIS — S0093XA Contusion of unspecified part of head, initial encounter: Secondary | ICD-10-CM | POA: Diagnosis not present

## 2020-12-19 NOTE — ED Notes (Signed)
Pt was ambulated. Pt seen with right leg drag as well as some unbalance.

## 2020-12-19 NOTE — ED Provider Notes (Signed)
Patient signed out to me by Dr. Karle Starch to follow-up on imaging. Patient had a ground-level fall earlier tonight. CT head and cervical spine do not show any injury. Patient continues to complain of some pain in the right hip. He is able to bear weight and walk on it but daughter reports that his gait seems different than normal. He therefore went back to radiology for a CT scan to further evaluate. No evidence of fracture noted.   Orpah Greek, MD 12/19/20 [phone]

## 2020-12-22 ENCOUNTER — Emergency Department
Admission: EM | Admit: 2020-12-22 | Discharge: 2020-12-23 | Disposition: A | Discharge status: Home / Self Care | Payer: Medicare Other | Attending: Emergency Medicine | Admitting: Emergency Medicine

## 2020-12-22 ENCOUNTER — Other Ambulatory Visit: Payer: Self-pay

## 2020-12-22 ENCOUNTER — Emergency Department: Payer: Medicare Other

## 2020-12-22 ENCOUNTER — Encounter: Payer: Self-pay | Admitting: Emergency Medicine

## 2020-12-22 DIAGNOSIS — E039 Hypothyroidism, unspecified: Secondary | ICD-10-CM | POA: Diagnosis not present

## 2020-12-22 DIAGNOSIS — R112 Nausea with vomiting, unspecified: Secondary | ICD-10-CM | POA: Insufficient documentation

## 2020-12-22 DIAGNOSIS — Z87891 Personal history of nicotine dependence: Secondary | ICD-10-CM | POA: Insufficient documentation

## 2020-12-22 DIAGNOSIS — W19XXXA Unspecified fall, initial encounter: Secondary | ICD-10-CM | POA: Insufficient documentation

## 2020-12-22 DIAGNOSIS — Z96652 Presence of left artificial knee joint: Secondary | ICD-10-CM | POA: Diagnosis not present

## 2020-12-22 DIAGNOSIS — I10 Essential (primary) hypertension: Secondary | ICD-10-CM | POA: Diagnosis not present

## 2020-12-22 DIAGNOSIS — Z79899 Other long term (current) drug therapy: Secondary | ICD-10-CM | POA: Insufficient documentation

## 2020-12-22 DIAGNOSIS — E860 Dehydration: Secondary | ICD-10-CM | POA: Insufficient documentation

## 2020-12-22 DIAGNOSIS — M47816 Spondylosis without myelopathy or radiculopathy, lumbar region: Secondary | ICD-10-CM | POA: Diagnosis not present

## 2020-12-22 DIAGNOSIS — Z7901 Long term (current) use of anticoagulants: Secondary | ICD-10-CM | POA: Diagnosis not present

## 2020-12-22 DIAGNOSIS — I2510 Atherosclerotic heart disease of native coronary artery without angina pectoris: Secondary | ICD-10-CM | POA: Insufficient documentation

## 2020-12-22 DIAGNOSIS — M1612 Unilateral primary osteoarthritis, left hip: Secondary | ICD-10-CM | POA: Diagnosis not present

## 2020-12-22 DIAGNOSIS — Z955 Presence of coronary angioplasty implant and graft: Secondary | ICD-10-CM | POA: Insufficient documentation

## 2020-12-22 DIAGNOSIS — R531 Weakness: Secondary | ICD-10-CM | POA: Insufficient documentation

## 2020-12-22 DIAGNOSIS — M25552 Pain in left hip: Secondary | ICD-10-CM | POA: Diagnosis not present

## 2020-12-22 DIAGNOSIS — R4182 Altered mental status, unspecified: Secondary | ICD-10-CM | POA: Diagnosis not present

## 2020-12-22 LAB — URINALYSIS, COMPLETE (UACMP) WITH MICROSCOPIC
Bacteria, UA: NONE SEEN
Bilirubin Urine: NEGATIVE
Glucose, UA: NEGATIVE mg/dL
Hgb urine dipstick: NEGATIVE
Ketones, ur: 5 mg/dL — AB
Leukocytes,Ua: NEGATIVE
Nitrite: NEGATIVE
Protein, ur: NEGATIVE mg/dL
Specific Gravity, Urine: 1.026 (ref 1.005–1.030)
pH: 5 (ref 5.0–8.0)

## 2020-12-22 LAB — CBC
HCT: 46.2 % (ref 39.0–52.0)
Hemoglobin: 14.8 g/dL (ref 13.0–17.0)
MCH: 28.5 pg (ref 26.0–34.0)
MCHC: 32 g/dL (ref 30.0–36.0)
MCV: 88.8 fL (ref 80.0–100.0)
Platelets: 255 10*3/uL (ref 150–400)
RBC: 5.2 MIL/uL (ref 4.22–5.81)
RDW: 15.4 % (ref 11.5–15.5)
WBC: 9.5 10*3/uL (ref 4.0–10.5)
nRBC: 0 % (ref 0.0–0.2)

## 2020-12-22 LAB — BASIC METABOLIC PANEL
Anion gap: 9 (ref 5–15)
BUN: 14 mg/dL (ref 8–23)
CO2: 24 mmol/L (ref 22–32)
Calcium: 9 mg/dL (ref 8.9–10.3)
Chloride: 102 mmol/L (ref 98–111)
Creatinine, Ser: 1.29 mg/dL — ABNORMAL HIGH (ref 0.61–1.24)
GFR, Estimated: 56 mL/min — ABNORMAL LOW (ref >60)
Glucose, Bld: 124 mg/dL — ABNORMAL HIGH (ref 70–99)
Potassium: 3.8 mmol/L (ref 3.5–5.1)
Sodium: 135 mmol/L (ref 135–145)

## 2020-12-22 MED ORDER — MECLIZINE HCL 25 MG PO TABS: 25.0000 mg | ORAL_TABLET | Freq: Once | ORAL | Status: DC

## 2020-12-22 MED ORDER — SODIUM CHLORIDE 0.9 % IV BOLUS
500.0000 mL | Freq: Once | INTRAVENOUS | Status: AC
  Administered 2020-12-22: 500 mL via INTRAVENOUS

## 2020-12-22 NOTE — ED Provider Notes (Signed)
[facility] Emergency Department Provider Note   ____________________________________________   I have reviewed the triage vital signs and the nursing notes.   HISTORY  Chief Complaint Hip Pain   History limited by: Not Limited   HPI Johnny [name] is a 80 y.o. male who presents to the emergency department today because of concerns for left hip pain.  Patient did have a fall and was seen 4 days ago.  At that time he was complaining primarily of right hip pain.  However for the past couple days he has now had worsening left hip pain.  The patient has not had any new falls. Patient does have history of arthritis. Additionally patient suffers from chronic nausea and has been vomiting the past couple of days, concerning family that he might be dehydrated. Furthermore the patient has been having some generalized increased weakness over the past 4-6 weeks. Family did notice that urine was darker today.   Records reviewed. Per medical record review patient has a history of ER visit 4 days ago with negative head CT and right hip ct.   Past Medical History:  Diagnosis Date  . Acromioclavicular joint arthritis   . Anemia   . B12 deficiency 10/15/11   "take injections q 28 days"  . Coronary artery disease    LHC 10/15/11: LAD stent patent with mid ectasia, distal LAD 50-60%, mid circumflex with ectasia, EF 55-65%.  . Dyslipidemia   . GERD (gastroesophageal reflux disease)   . Glucose intolerance (pre-diabetes)    Borderline glucose intolerance  . Hypertension   . Hypothyroidism   . Post concussive syndrome 2011   "for ~ 6-8 months"  . Rotator cuff tear   . Shortness of breath 10/15/11   "here lately I've been having it on & off any time"  . SVT (supraventricular tachycardia) ([place])    maintained on beta blocker    Patient Active Problem List   Diagnosis Date Noted  . Atrial fibrillation with rapid ventricular response ([place]) 01/25/2020  . AKI (acute kidney  injury) ([place])   . Acute metabolic encephalopathy 34/19/6222  . Acute on chronic cholecystitis s/p lap cholecystectomy 03/23/2019 03/23/2019  . Chronic anticoagulation 03/23/2019  . Chronic cholecystitis 03/23/2019  . Unstable angina ([place]) 11/05/2018  . Tachycardia 10/29/2018  . Pain in left hip 08/13/2018  . Trochanteric bursitis of left hip 06/01/2018  . Hypothyroidism 11/04/2011  . SVT (supraventricular tachycardia) ([place]) 04/10/2011  . HTN (hypertension) 04/10/2011  . Dyslipidemia 04/10/2011  . Coronary artery disease 11/25/2010  . GERD 11/25/2010  . CONSTIPATION 11/25/2010  . CHEST PAIN UNSPECIFIED 11/25/2010  . PERSONAL HISTORY OF COLONIC POLYPS 11/25/2010    Past Surgical History:  Procedure Laterality Date  . BACK SURGERY    . CARDIAC CATHETERIZATION  12/19/08   NORMAL. EF 55%  . CARDIAC CATHETERIZATION  10/15/11  . CARDIOVASCULAR STRESS TEST  10/2010  . CARPAL TUNNEL RELEASE     bilaterally  . CHOLECYSTECTOMY  03/23/2019  . COLONOSCOPY W/ ENDOSCOPIC [place]    . CORONARY ANGIOPLASTY WITH STENT PLACEMENT  9/ 2005   . JOINT REPLACEMENT    . LAPAROSCOPIC CHOLECYSTECTOMY SINGLE SITE WITH INTRAOPERATIVE CHOLANGIOGRAM N/A 03/23/2019   Procedure: SINGLE SITE LAPAROSCOPIC CHOLECYSTECTOMY WITH INTRAOPERATIVE CHOLANGIOGRAM;  Surgeon: Michael Boston, MD;  Location: [place];  Service: General;  Laterality: N/A;  . LEFT HEART CATH AND CORONARY ANGIOGRAPHY N/A 11/05/2018   Procedure: LEFT HEART CATH AND CORONARY ANGIOGRAPHY;  Surgeon: Martinique, Peter M, MD;  Location: [place]  [facility];  Service: Cardiovascular;  Laterality: N/A;  . LEFT HEART CATHETERIZATION WITH CORONARY ANGIOGRAM N/A 10/15/2011   Procedure: LEFT HEART CATHETERIZATION WITH CORONARY ANGIOGRAM;  Surgeon: Sherren Mocha, MD;  Location: [facility] CATH LAB;  Service: Cardiovascular;  Laterality: N/A;  . LUMBAR LAMINECTOMY  1970's  . NASAL SINUS SURGERY     x2  . reconstructive shoulder surgery     right  . REPLACEMENT TOTAL KNEE  12/2007    left  . RESECTION TUMOR CLAVICLE RADICAL     Open distal clavicle resection  . SHOULDER SURGERY  02/11/2011   right  . TRIGGER FINGER RELEASE     ? both hands    Prior to Admission medications   Medication Sig Start Date End Date Taking? Authorizing Provider  acetaminophen (TYLENOL) 650 MG CR tablet Take 1,300 mg by mouth 2 (two) times daily.     [provider]  albuterol (VENTOLIN HFA) 108 (90 Base) MCG/ACT inhaler Inhale 2 puffs into the lungs every 6 (six) hours as needed for wheezing or shortness of breath. 01/27/20   Mercy Riding, MD  ANORO ELLIPTA 62.5-25 MCG/INH AEPB Inhale 1 puff into the lungs daily as needed (shortness of breath).  01/06/19   [provider]  atorvastatin (LIPITOR) 10 MG tablet Take 1 tablet (10 mg total) by mouth daily. 01/02/20   Martinique, Peter M, MD  Cholecalciferol (VITAMIN D3) 50 MCG (2000 UT) capsule Take 2,000 Units by mouth every evening.    [provider]  Cyanocobalamin (VITAMIN B-12 IJ) Inject 1,000 mcg as directed every 30 (thirty) days.    [provider]  diclofenac sodium (VOLTAREN) 1 % GEL Apply 2 g topically 4 (four) times daily. Patient taking differently: Apply 2 g topically 4 (four) times daily as needed (hip pain). 08/13/18   Aundra Dubin, PA-C  DULoxetine (CYMBALTA) 30 MG capsule Take 30 mg by mouth every evening.    [provider]  ELIQUIS 5 MG TABS tablet TAKE 1 TABLET BY MOUTH TWICE A DAY Patient taking differently: Take 5 mg by mouth 2 (two) times daily. 08/23/20   Martinique, Peter M, MD  furosemide (LASIX) 20 MG tablet Take 1 tablet (20 mg total) by mouth daily as needed for fluid or edema (or SOB or weight gain). 03/27/19   Mikhail, Velta Addison, DO  Glucos-Chond-Hyal Ac-Ca Fructo (MOVE FREE JOINT HEALTH ADVANCE PO) Take 1 capsule by mouth 2 (two) times daily.    [provider]  hydrOXYzine (ATARAX) 25 MG tablet Take 25 mg by mouth at bedtime.    [provider]  levothyroxine  (SYNTHROID) 50 MCG tablet Take 50 mcg by mouth daily before breakfast.    [provider]  lisinopril (ZESTRIL) 20 MG tablet Take 1 tablet (20 mg total) by mouth daily. 01/02/20 12/27/20  Martinique, Peter M, MD  loperamide (IMODIUM A-D) 2 MG tablet Take 2-4 mg by mouth 4 (four) times daily as needed for diarrhea or loose stools.     [provider]  meclizine (ANTIVERT) 25 MG tablet Take 25 mg by mouth 2 (two) times daily as needed for dizziness or nausea.    [provider]  metoprolol tartrate (LOPRESSOR) 50 MG tablet Take 1.5 tablets (75 mg total) by mouth 2 (two) times daily. 05/28/20 05/28/21  Martinique, Peter M, MD  mupirocin ointment (BACTROBAN) 2 % Apply 1 application topically 2 (two) times daily as needed (to affected areas- irritated hair follicles on face).  08/15/19   [provider]  nitroGLYCERIN (NITROSTAT) 0.4 MG SL tablet Place 1 tablet (0.4 mg total) under the tongue every 5 (five) minutes as needed for chest pain. 10/15/11   Larey Dresser, MD  ondansetron (ZOFRAN) 4 MG tablet Take 1 tablet (4 mg total) by mouth every 8 (eight) hours as needed for nausea or vomiting. 01/20/19   Mesner, Corene Cornea, MD  pantoprazole (PROTONIX) 40 MG tablet Take 1 tablet (40 mg total) by mouth daily. NEED OV. Patient taking differently: Take 40 mg by mouth daily before breakfast. 11/25/18   Martinique, Peter M, MD    Allergies Patient has no known allergies.  Family History  Problem Relation Age of Onset  . Stroke Mother   . Heart attack Father   . Diabetes Father   . Kidney failure Father     Social History Social History   Tobacco Use  . Smoking status: Former Smoker    Packs/day: 1.00    Years: 21.00    Pack years: 21.00    Types: Cigarettes    Quit date: 04/07/1976    Years since quitting: 44.7  . Smokeless tobacco: Never Used  Vaping Use  . Vaping Use: Never used  Substance Use Topics  . Alcohol use: Not Currently    Comment: 10/15/11 "haven't drank in 15-20  years"  . Drug use: No    Review of Systems Constitutional: No fever/chills Eyes: No visual changes. ENT: No sore throat. Cardiovascular: Denies chest pain. Respiratory: Denies shortness of breath. Gastrointestinal: No abdominal pain. Positive for nausea and vomiting.  Genitourinary: Positive for dark urine.  Musculoskeletal: Positive for left hip pain.  Skin: Negative for rash. Neurological: Negative for headaches, focal weakness or numbness.  ____________________________________________   PHYSICAL EXAM:  VITAL SIGNS: ED Triage Vitals  Enc Vitals Group     BP 12/22/20 1656 136/86     Pulse Rate 12/22/20 1656 64     Resp 12/22/20 1656 20     Temp 12/22/20 1656 98.4 F (36.9 C)     Temp Source 12/22/20 1656 Oral     SpO2 12/22/20 1656 100 %     Weight 12/22/20 1657 190 lb (86.2 kg)     Height 12/22/20 1657 6' (1.829 m)     Head Circumference --      Peak Flow --      Pain Score 12/22/20 1920 0     Pain Loc --      Pain Edu? --      Excl. in [place]? --      Constitutional: Alert and oriented.  Eyes: Conjunctivae are normal.  ENT      Head: Normocephalic and atraumatic.      Nose: No congestion/rhinnorhea.      Mouth/Throat: Mucous membranes are moist.      Neck: No stridor. Hematological/Lymphatic/Immunilogical: No cervical lymphadenopathy. Cardiovascular: Normal rate, regular rhythm.  No murmurs, rubs, or gallops.  Respiratory: Normal respiratory effort without tachypnea nor retractions. Breath sounds are clear and equal bilaterally. No wheezes/rales/rhonchi. Gastrointestinal: Soft and non tender. No rebound. No guarding.  Genitourinary: Deferred Musculoskeletal: Normal range of motion in all extremities. No tenderness to palpation of the left hip.  Neurologic:  Normal speech and language. No gross focal neurologic deficits are appreciated.  Skin:  Skin is warm, dry and intact. No rash noted. Psychiatric: Anxious.  Tearful.  ____________________________________________    LABS (pertinent positives/negatives)  CBC wbc 9.5, hgb 14.8, plt 255 BMP wnl except glu 124, cr 1.29 UA clear, ketones 5,  0-5 rbc and wbc ____________________________________________   EKG  I, Nance Pear, attending physician, personally viewed and interpreted this EKG  EKG Time: 1647 Rate: 70 Rhythm: possible atrial bigeminy Axis: left axis deviation Intervals: qtc 477 QRS: non specific IVCD ST changes: no st eelvation Impression: abnormal ekg  ____________________________________________    RADIOLOGY  CT head No acute abnormality  Left hip No acute osseous abnormality ____________________________________________   PROCEDURES  Procedures  ____________________________________________   INITIAL IMPRESSION / ASSESSMENT AND PLAN / ED COURSE  Pertinent labs & imaging results that were available during my care of the patient were reviewed by me and considered in my medical decision making (see chart for details).   Patient presented to the emergency department today primarily for left hip pain.  Patient has been complaining of worsening of left hip pain over the past couple of days.  On exam patient without any tenderness to the left hip.  No significant discomfort with manipulation of the left hip.  X-ray was obtained which not show any acute osseous abnormality.  There also was some concern for a urinary tract infection however that was negative. Patient did have some nausea and vomiting here. However it appears that this is a somewhat chronic issue for the patient. Does take zofran at home, currently being dosed every 8 hours. Did discuss with family that they can increase the dosing to see if that helps his symptoms.   ____________________________________________   FINAL CLINICAL IMPRESSION(S) / ED DIAGNOSES  Final diagnoses:  Left hip pain  Dehydration     Note: This dictation was prepared  with Dragon dictation. Any transcriptional errors that result from this process are unintentional     Nance Pear, MD 12/22/20 2350

## 2020-12-22 NOTE — Discharge Instructions (Addendum)
Please seek medical attention for any high fevers, chest pain, shortness of breath, change in behavior, persistent vomiting, bloody stool or any other new or concerning symptoms.  

## 2020-12-22 NOTE — ED Notes (Addendum)
Patient had an episode of bile-colored emesis when he was brought back from imaging. Patient's daughter states that every time he moved today, either walking and being pushed in the wheelchair, he stated that he felt nauseous or vomited. Daughter states he vomited x6 today. Daughter states patient has only voided once since 2200 last night. Dr. Archie Balboa aware.

## 2020-12-22 NOTE — ED Notes (Signed)
Patient is tearful at times. Patient denies hip pain at this time.

## 2020-12-22 NOTE — ED Notes (Signed)
Patient state he feels better. Daughter states patient looks better than he has.

## 2020-12-22 NOTE — ED Notes (Signed)
Report given to Haley RN.

## 2020-12-22 NOTE — ED Notes (Signed)
Patient is at imaging. 

## 2020-12-22 NOTE — ED Triage Notes (Addendum)
Pt via POV from home. Per daughter, pt was seen at [facility] on 2/22, for a mechanical fall. Pt was d/c from [facility]. Pt is on Eliquis. Pt c/o L hip and pelvic pain and nausea. Pt has not had any new falls since he was seen at [place] has a previous prescription for Zofran with no relief. Pt is alert but when asked questions pt keeps stating "I don't know" and crying. Per family, pt has been more confused for the past couple of weeks. Pt has a hx B12 deficiency and family states that he got his B12 shot approx 3 weeks late. Shot was given on 2/22

## 2021-01-02 ENCOUNTER — Other Ambulatory Visit: Payer: Self-pay | Admitting: Internal Medicine

## 2021-01-02 ENCOUNTER — Ambulatory Visit (HOSPITAL_COMMUNITY)
Admission: RE | Admit: 2021-01-02 | Discharge: 2021-01-02 | Disposition: A | Discharge status: Home / Self Care | Payer: Medicare Other | Source: Ambulatory Visit | Attending: Internal Medicine | Admitting: Internal Medicine

## 2021-01-02 ENCOUNTER — Other Ambulatory Visit: Payer: Self-pay

## 2021-01-02 DIAGNOSIS — R269 Unspecified abnormalities of gait and mobility: Secondary | ICD-10-CM | POA: Insufficient documentation

## 2021-01-02 DIAGNOSIS — R413 Other amnesia: Secondary | ICD-10-CM | POA: Diagnosis not present

## 2021-01-02 DIAGNOSIS — D510 Vitamin B12 deficiency anemia due to intrinsic factor deficiency: Secondary | ICD-10-CM | POA: Diagnosis not present

## 2021-01-02 DIAGNOSIS — G9389 Other specified disorders of brain: Secondary | ICD-10-CM | POA: Diagnosis not present

## 2021-01-02 DIAGNOSIS — R296 Repeated falls: Secondary | ICD-10-CM | POA: Insufficient documentation

## 2021-01-02 DIAGNOSIS — E039 Hypothyroidism, unspecified: Secondary | ICD-10-CM | POA: Diagnosis not present

## 2021-01-02 DIAGNOSIS — I10 Essential (primary) hypertension: Secondary | ICD-10-CM | POA: Diagnosis not present

## 2021-01-02 DIAGNOSIS — G319 Degenerative disease of nervous system, unspecified: Secondary | ICD-10-CM | POA: Diagnosis not present

## 2021-01-02 DIAGNOSIS — K219 Gastro-esophageal reflux disease without esophagitis: Secondary | ICD-10-CM | POA: Diagnosis not present

## 2021-01-02 DIAGNOSIS — R7301 Impaired fasting glucose: Secondary | ICD-10-CM | POA: Diagnosis not present

## 2021-01-02 DIAGNOSIS — I2510 Atherosclerotic heart disease of native coronary artery without angina pectoris: Secondary | ICD-10-CM | POA: Diagnosis not present

## 2021-01-02 DIAGNOSIS — F329 Major depressive disorder, single episode, unspecified: Secondary | ICD-10-CM | POA: Diagnosis not present

## 2021-01-02 DIAGNOSIS — I6782 Cerebral ischemia: Secondary | ICD-10-CM | POA: Diagnosis not present

## 2021-01-02 DIAGNOSIS — N1831 Chronic kidney disease, stage 3a: Secondary | ICD-10-CM | POA: Diagnosis not present

## 2021-01-02 DIAGNOSIS — I129 Hypertensive chronic kidney disease with stage 1 through stage 4 chronic kidney disease, or unspecified chronic kidney disease: Secondary | ICD-10-CM | POA: Diagnosis not present

## 2021-01-02 DIAGNOSIS — M545 Low back pain, unspecified: Secondary | ICD-10-CM | POA: Diagnosis not present

## 2021-01-02 DIAGNOSIS — I6389 Other cerebral infarction: Secondary | ICD-10-CM | POA: Diagnosis not present

## 2021-01-02 MED ORDER — GADOBUTROL 1 MMOL/ML IV SOLN
10.0000 mL | Freq: Once | INTRAVENOUS | Status: AC | PRN
  Administered 2021-01-02: 10 mL via INTRAVENOUS

## 2021-01-04 DIAGNOSIS — I129 Hypertensive chronic kidney disease with stage 1 through stage 4 chronic kidney disease, or unspecified chronic kidney disease: Secondary | ICD-10-CM | POA: Diagnosis not present

## 2021-01-04 DIAGNOSIS — D510 Vitamin B12 deficiency anemia due to intrinsic factor deficiency: Secondary | ICD-10-CM | POA: Diagnosis not present

## 2021-01-04 DIAGNOSIS — R269 Unspecified abnormalities of gait and mobility: Secondary | ICD-10-CM | POA: Diagnosis not present

## 2021-01-04 DIAGNOSIS — N1831 Chronic kidney disease, stage 3a: Secondary | ICD-10-CM | POA: Diagnosis not present

## 2021-01-04 DIAGNOSIS — R413 Other amnesia: Secondary | ICD-10-CM | POA: Diagnosis not present

## 2021-01-04 DIAGNOSIS — R296 Repeated falls: Secondary | ICD-10-CM | POA: Diagnosis not present

## 2021-01-04 DIAGNOSIS — R110 Nausea: Secondary | ICD-10-CM | POA: Diagnosis not present

## 2021-01-04 DIAGNOSIS — I6389 Other cerebral infarction: Secondary | ICD-10-CM | POA: Diagnosis not present

## 2021-01-04 DIAGNOSIS — K219 Gastro-esophageal reflux disease without esophagitis: Secondary | ICD-10-CM | POA: Diagnosis not present

## 2021-01-08 DIAGNOSIS — M25561 Pain in right knee: Secondary | ICD-10-CM | POA: Diagnosis not present

## 2021-01-11 DIAGNOSIS — Z7901 Long term (current) use of anticoagulants: Secondary | ICD-10-CM | POA: Diagnosis not present

## 2021-01-11 DIAGNOSIS — N189 Chronic kidney disease, unspecified: Secondary | ICD-10-CM | POA: Diagnosis not present

## 2021-01-11 DIAGNOSIS — I2510 Atherosclerotic heart disease of native coronary artery without angina pectoris: Secondary | ICD-10-CM | POA: Diagnosis not present

## 2021-01-11 DIAGNOSIS — I69398 Other sequelae of cerebral infarction: Secondary | ICD-10-CM | POA: Diagnosis not present

## 2021-01-11 DIAGNOSIS — E039 Hypothyroidism, unspecified: Secondary | ICD-10-CM | POA: Diagnosis not present

## 2021-01-11 DIAGNOSIS — I129 Hypertensive chronic kidney disease with stage 1 through stage 4 chronic kidney disease, or unspecified chronic kidney disease: Secondary | ICD-10-CM | POA: Diagnosis not present

## 2021-01-11 DIAGNOSIS — F329 Major depressive disorder, single episode, unspecified: Secondary | ICD-10-CM | POA: Diagnosis not present

## 2021-01-11 DIAGNOSIS — R269 Unspecified abnormalities of gait and mobility: Secondary | ICD-10-CM | POA: Diagnosis not present

## 2021-01-11 DIAGNOSIS — K219 Gastro-esophageal reflux disease without esophagitis: Secondary | ICD-10-CM | POA: Diagnosis not present

## 2021-01-11 DIAGNOSIS — D510 Vitamin B12 deficiency anemia due to intrinsic factor deficiency: Secondary | ICD-10-CM | POA: Diagnosis not present

## 2021-01-11 DIAGNOSIS — Z9181 History of falling: Secondary | ICD-10-CM | POA: Diagnosis not present

## 2021-01-11 DIAGNOSIS — Z87891 Personal history of nicotine dependence: Secondary | ICD-10-CM | POA: Diagnosis not present

## 2021-01-15 DIAGNOSIS — D510 Vitamin B12 deficiency anemia due to intrinsic factor deficiency: Secondary | ICD-10-CM | POA: Diagnosis not present

## 2021-01-18 DIAGNOSIS — K219 Gastro-esophageal reflux disease without esophagitis: Secondary | ICD-10-CM | POA: Diagnosis not present

## 2021-01-18 DIAGNOSIS — I69398 Other sequelae of cerebral infarction: Secondary | ICD-10-CM | POA: Diagnosis not present

## 2021-01-18 DIAGNOSIS — I2510 Atherosclerotic heart disease of native coronary artery without angina pectoris: Secondary | ICD-10-CM | POA: Diagnosis not present

## 2021-01-18 DIAGNOSIS — N189 Chronic kidney disease, unspecified: Secondary | ICD-10-CM | POA: Diagnosis not present

## 2021-01-18 DIAGNOSIS — R269 Unspecified abnormalities of gait and mobility: Secondary | ICD-10-CM | POA: Diagnosis not present

## 2021-01-18 DIAGNOSIS — I129 Hypertensive chronic kidney disease with stage 1 through stage 4 chronic kidney disease, or unspecified chronic kidney disease: Secondary | ICD-10-CM | POA: Diagnosis not present

## 2021-02-10 DIAGNOSIS — F329 Major depressive disorder, single episode, unspecified: Secondary | ICD-10-CM | POA: Diagnosis not present

## 2021-02-10 DIAGNOSIS — N189 Chronic kidney disease, unspecified: Secondary | ICD-10-CM | POA: Diagnosis not present

## 2021-02-10 DIAGNOSIS — I2510 Atherosclerotic heart disease of native coronary artery without angina pectoris: Secondary | ICD-10-CM | POA: Diagnosis not present

## 2021-02-10 DIAGNOSIS — Z9181 History of falling: Secondary | ICD-10-CM | POA: Diagnosis not present

## 2021-02-10 DIAGNOSIS — I129 Hypertensive chronic kidney disease with stage 1 through stage 4 chronic kidney disease, or unspecified chronic kidney disease: Secondary | ICD-10-CM | POA: Diagnosis not present

## 2021-02-10 DIAGNOSIS — K219 Gastro-esophageal reflux disease without esophagitis: Secondary | ICD-10-CM | POA: Diagnosis not present

## 2021-02-10 DIAGNOSIS — D510 Vitamin B12 deficiency anemia due to intrinsic factor deficiency: Secondary | ICD-10-CM | POA: Diagnosis not present

## 2021-02-10 DIAGNOSIS — R269 Unspecified abnormalities of gait and mobility: Secondary | ICD-10-CM | POA: Diagnosis not present

## 2021-02-10 DIAGNOSIS — E039 Hypothyroidism, unspecified: Secondary | ICD-10-CM | POA: Diagnosis not present

## 2021-02-10 DIAGNOSIS — I69398 Other sequelae of cerebral infarction: Secondary | ICD-10-CM | POA: Diagnosis not present

## 2021-02-10 DIAGNOSIS — Z7901 Long term (current) use of anticoagulants: Secondary | ICD-10-CM | POA: Diagnosis not present

## 2021-02-10 DIAGNOSIS — Z87891 Personal history of nicotine dependence: Secondary | ICD-10-CM | POA: Diagnosis not present

## 2021-02-12 DIAGNOSIS — N189 Chronic kidney disease, unspecified: Secondary | ICD-10-CM | POA: Diagnosis not present

## 2021-02-12 DIAGNOSIS — K219 Gastro-esophageal reflux disease without esophagitis: Secondary | ICD-10-CM | POA: Diagnosis not present

## 2021-02-12 DIAGNOSIS — I2510 Atherosclerotic heart disease of native coronary artery without angina pectoris: Secondary | ICD-10-CM | POA: Diagnosis not present

## 2021-02-12 DIAGNOSIS — I69398 Other sequelae of cerebral infarction: Secondary | ICD-10-CM | POA: Diagnosis not present

## 2021-02-12 DIAGNOSIS — I129 Hypertensive chronic kidney disease with stage 1 through stage 4 chronic kidney disease, or unspecified chronic kidney disease: Secondary | ICD-10-CM | POA: Diagnosis not present

## 2021-02-12 DIAGNOSIS — R269 Unspecified abnormalities of gait and mobility: Secondary | ICD-10-CM | POA: Diagnosis not present

## 2021-02-20 DIAGNOSIS — D510 Vitamin B12 deficiency anemia due to intrinsic factor deficiency: Secondary | ICD-10-CM | POA: Diagnosis not present

## 2021-02-22 DIAGNOSIS — F32A Depression, unspecified: Secondary | ICD-10-CM | POA: Diagnosis not present

## 2021-02-22 DIAGNOSIS — E039 Hypothyroidism, unspecified: Secondary | ICD-10-CM | POA: Diagnosis not present

## 2021-02-22 DIAGNOSIS — M1990 Unspecified osteoarthritis, unspecified site: Secondary | ICD-10-CM | POA: Diagnosis not present

## 2021-02-22 DIAGNOSIS — I119 Hypertensive heart disease without heart failure: Secondary | ICD-10-CM | POA: Diagnosis not present

## 2021-02-22 DIAGNOSIS — R634 Abnormal weight loss: Secondary | ICD-10-CM | POA: Diagnosis not present

## 2021-02-22 DIAGNOSIS — I482 Chronic atrial fibrillation, unspecified: Secondary | ICD-10-CM | POA: Diagnosis not present

## 2021-02-22 DIAGNOSIS — E785 Hyperlipidemia, unspecified: Secondary | ICD-10-CM | POA: Diagnosis not present

## 2021-02-22 DIAGNOSIS — F419 Anxiety disorder, unspecified: Secondary | ICD-10-CM | POA: Diagnosis not present

## 2021-02-22 DIAGNOSIS — K219 Gastro-esophageal reflux disease without esophagitis: Secondary | ICD-10-CM | POA: Diagnosis not present

## 2021-02-23 NOTE — Progress Notes (Deleted)
Johnny [name] Date of Birth: 1941/07/23   History of Present Illness: Johnny [name] is seen for follow up SVT and CAD.  He has a history of supraventricular tachycardia. He is s/p stenting of the proximal LAD in 2005 with a Cypher stent. He is on chronic DAPT. He had repeat caths in 2010 and Dec. 2012 with nonobstructive disease.   He was admitted 1/3-10/30/18 with complaints of dyspnea, chest pressure and a "fluttering" sensation. Troponin levels were negative x 3. V/Q scan was low probability for PE. Creatinine was elevated to 1.79. ACEi and diuretic were held. Metoprolol increased for fluttering. There was some consideration for cardiac cath but this was deferred due to elevated creatinine. On follow up he had  persistent anterior chest pressure and tightness like he is wearing a seatbelt that is too tight. BP has been running higher. He has no energy and complains of SOB.  Due to these persistent symptoms and improvement in renal function he did undergo a cardiac cath on 11/05/18 showing nonobstructive CAD.  In [place] 2020 he underwent laproscopic cholecystectomy. Initially did well but returned the day after DC with confusion and metabolic encephalopathy. Oxygen level was low. CT negative for PE. Echo stable. BNP mildly elevated as were troponin levels. CXR some atx. CT head was OK. DC on 5/30.   He as admitted 3/30-01/27/20 with Afib and RVR. This followed acute food poisoning with projectile N/V after eating  barbecue. He was dehydrated. Seen by cardiology and started on amiodarone and Eliquis. Plavix discontinued. He converted to NSR. DC on amiodarone 400 mg daily later reduced to 200 mg. Echo was unremarkable. On follow up Amiodarone was discontinued due to fatigue,weakness, N/V.  On follow up today he is seen with his sitter. Still is very weak. Has persistent nausea. Has lost 2 lbs. Has never had to use lasix. Denies any palpitations, chest pain. Notes when BP is taken his pulse is  Regular.  Gets winded  easily.   Current Outpatient Medications on File Prior to Visit  Medication Sig Dispense Refill  . acetaminophen (TYLENOL) 650 MG CR tablet Take 1,300 mg by mouth 2 (two) times daily.     [name] albuterol (VENTOLIN HFA) 108 (90 Base) MCG/ACT inhaler Inhale 2 puffs into the lungs every 6 (six) hours as needed for wheezing or shortness of breath. 18 g 1  . ANORO ELLIPTA 62.5-25 MCG/INH AEPB Inhale 1 puff into the lungs daily as needed (shortness of breath).     [name] atorvastatin (LIPITOR) 10 MG tablet Take 1 tablet (10 mg total) by mouth daily. 90 tablet 3  . Cholecalciferol (VITAMIN D3) 50 MCG (2000 UT) capsule Take 2,000 Units by mouth every evening.    . Cyanocobalamin (VITAMIN B-12 IJ) Inject 1,000 mcg as directed every 30 (thirty) days.    . diclofenac sodium (VOLTAREN) 1 % GEL Apply 2 g topically 4 (four) times daily. (Patient taking differently: Apply 2 g topically 4 (four) times daily as needed (hip pain).) 1 Tube 5  . DULoxetine (CYMBALTA) 30 MG capsule Take 30 mg by mouth every evening.    [name] ELIQUIS 5 MG TABS tablet TAKE 1 TABLET BY MOUTH TWICE A DAY (Patient taking differently: Take 5 mg by mouth 2 (two) times daily.) 180 tablet 1  . furosemide (LASIX) 20 MG tablet Take 1 tablet (20 mg total) by mouth daily as needed for fluid or edema (or SOB or weight gain). 30 tablet 0  . Glucos-Chond-Hyal Ac-Ca Fructo (MOVE FREE JOINT  HEALTH ADVANCE PO) Take 1 capsule by mouth 2 (two) times daily.    . hydrOXYzine (ATARAX) 25 MG tablet Take 25 mg by mouth at bedtime.    [name] levothyroxine (SYNTHROID) 50 MCG tablet Take 50 mcg by mouth daily before breakfast.    . lisinopril (ZESTRIL) 20 MG tablet Take 1 tablet (20 mg total) by mouth daily. 90 tablet 3  . loperamide (IMODIUM A-D) 2 MG tablet Take 2-4 mg by mouth 4 (four) times daily as needed for diarrhea or loose stools.     . meclizine (ANTIVERT) 25 MG tablet Take 25 mg by mouth 2 (two) times daily as needed for dizziness or nausea.    . metoprolol tartrate  (LOPRESSOR) 50 MG tablet Take 1.5 tablets (75 mg total) by mouth 2 (two) times daily. 270 tablet 3  . mupirocin ointment (BACTROBAN) 2 % Apply 1 application topically 2 (two) times daily as needed (to affected areas- irritated hair follicles on face).     . nitroGLYCERIN (NITROSTAT) 0.4 MG SL tablet Place 1 tablet (0.4 mg total) under the tongue every 5 (five) minutes as needed for chest pain. 1 tablet 0  . ondansetron (ZOFRAN) 4 MG tablet Take 1 tablet (4 mg total) by mouth every 8 (eight) hours as needed for nausea or vomiting. 12 tablet 0  . pantoprazole (PROTONIX) 40 MG tablet Take 1 tablet (40 mg total) by mouth daily. NEED OV. (Patient taking differently: Take 40 mg by mouth daily before breakfast.) 180 tablet 1   No current facility-administered medications on file prior to visit.    No Known Allergies  Past Medical History:  Diagnosis Date  . Acromioclavicular joint arthritis   . Anemia   . B12 deficiency 10/15/11   "take injections q 28 days"  . Coronary artery disease    LHC 10/15/11: LAD stent patent with mid ectasia, distal LAD 50-60%, mid circumflex with ectasia, EF 55-65%.  . Dyslipidemia   . GERD (gastroesophageal reflux disease)   . Glucose intolerance (pre-diabetes)    Borderline glucose intolerance  . Hypertension   . Hypothyroidism   . Post concussive syndrome 2011   "for ~ 6-8 months"  . Rotator cuff tear   . Shortness of breath 10/15/11   "here lately I've been having it on & off any time"  . SVT (supraventricular tachycardia) ([place])    maintained on beta blocker    Past Surgical History:  Procedure Laterality Date  . BACK SURGERY    . CARDIAC CATHETERIZATION  12/19/08   NORMAL. EF 55%  . CARDIAC CATHETERIZATION  10/15/11  . CARDIOVASCULAR STRESS TEST  10/2010  . CARPAL TUNNEL RELEASE     bilaterally  . CHOLECYSTECTOMY  03/23/2019  . COLONOSCOPY W/ ENDOSCOPIC [place]    . CORONARY ANGIOPLASTY WITH STENT PLACEMENT  9/ 2005   . JOINT REPLACEMENT    .  LAPAROSCOPIC CHOLECYSTECTOMY SINGLE SITE WITH INTRAOPERATIVE CHOLANGIOGRAM N/A 03/23/2019   Procedure: SINGLE SITE LAPAROSCOPIC CHOLECYSTECTOMY WITH INTRAOPERATIVE CHOLANGIOGRAM;  Surgeon: Michael Boston, MD;  Location: [facility];  Service: General;  Laterality: N/A;  . LEFT HEART CATH AND CORONARY ANGIOGRAPHY N/A 11/05/2018   Procedure: LEFT HEART CATH AND CORONARY ANGIOGRAPHY;  Surgeon: [place], Stepehn Eckard M, MD;  Location: [facility];  Service: Cardiovascular;  Laterality: N/A;  . LEFT HEART CATHETERIZATION WITH CORONARY ANGIOGRAM N/A 10/15/2011   Procedure: LEFT HEART CATHETERIZATION WITH CORONARY ANGIOGRAM;  Surgeon: Sherren Mocha, MD;  Location: [facility] C[facility];  Service: Cardiovascular;  Laterality: N/A;  .  LUMBAR LAMINECTOMY  1970's  . NASAL SINUS SURGERY     x2  . reconstructive shoulder surgery     right  . REPLACEMENT TOTAL KNEE  12/2007   left  . RESECTION TUMOR CLAVICLE RADICAL     Open distal clavicle resection  . SHOULDER SURGERY  02/11/2011   right  . TRIGGER FINGER RELEASE     ? both hands    Social History   Tobacco Use  Smoking Status Former Smoker  . Packs/day: 1.00  . Years: 21.00  . Pack years: 21.00  . Types: Cigarettes  . Quit date: 04/07/1976  . Years since quitting: 44.9  Smokeless Tobacco Never Used    Social History   Substance and Sexual Activity  Alcohol Use Not Currently   Comment: 10/15/11 "haven't drank in 15-20 years"    Family History  Problem Relation Age of Onset  . Stroke Mother   . Heart attack Father   . Diabetes Father   . Kidney failure Father     Review of Systems: As noted in history of present illness.  All other systems were reviewed and are negative.  Physical Exam: There were no vitals taken for this visit. GENERAL:  Well appearing WM in NAD HEENT:  PERRL, EOMI, sclera are clear. Oropharynx is clear. NECK:  No jugular venous distention, carotid upstroke brisk and symmetric, no bruits, no thyromegaly or adenopathy LUNGS:   Clear to auscultation bilaterally CHEST:  Unremarkable HEART:  RRR with occ extrasystoles. I get a pulse in the 70s,  PMI not displaced or sustained,S1 and S2 within normal limits, no S3, no S4: no clicks, no rubs, no murmurs ABD:  Soft, nontender. BS +, no masses or bruits. No hepatomegaly, no splenomegaly EXT:  2 + pulses throughout, no edema, no cyanosis no clubbing. No hematoma at cath site. SKIN:  Warm and dry.  No rashes NEURO:  Alert and oriented x 3. Cranial nerves II through XII intact. PSYCH:  Cognitively intact      LABORATORY DATA:  Lab Results  Component Value Date   WBC 9.5 12/22/2020   HGB 14.8 12/22/2020   HCT 46.2 12/22/2020   PLT 255 12/22/2020   GLUCOSE 124 (H) 12/22/2020   CHOL 133 10/30/2018   TRIG 142 10/30/2018   HDL 39 (L) 10/30/2018   LDLCALC 66 10/30/2018   ALT 22 12/18/2020   AST 18 12/18/2020   NA 135 12/22/2020   K 3.8 12/22/2020   CL 102 12/22/2020   CREATININE 1.29 (H) 12/22/2020   BUN 14 12/22/2020   CO2 24 12/22/2020   TSH 3.602 01/25/2020   INR 1.1 03/25/2019   HGBA1C 5.0 10/29/2018    Reviewed from primary care dated 05/14/16: cholesterol 141, triglycerides 174, HDL39, LDL 67. BUN 23, creatinine 1.5. Other chemistries, Hgb, and TSH were normal. Dated 07/01/19: cholesterol 144, triglycerides 92, HDL 49, LDL 77. A1c 5.3%.  Dated 06/29/20: cholesterol 146, triglycerides 160, HDL 47, LDL 67.  Dated 12/22/20: normal CBC. Creatinine 1.29. Dated 01/02/21: A1c 5.6%. CMET normal   Cardiac cath 11/05/18: Procedures   LEFT HEART CATH AND CORONARY ANGIOGRAPHY  Conclusion     Previously placed Prox LAD stent (unknown type) is widely patent.  Mid LAD lesion is 45% stenosed.  LV end diastolic pressure is normal.   1. Nonobstructive CAD 2. Normal LVEDP  Plan: continue medical therapy. Will obtain an Echo as outpatient to assess LV function. Consider alternative causes of chest pain.   Echo 01/26/20: IMPRESSIONS  1. Low normal LV systolic  function; mild LVH; grade 1 diastolic  dysfunction.  2. Left ventricular ejection fraction, by estimation, is 50 to 55%. The  left ventricle has low normal function. The left ventricle has no regional  wall motion abnormalities. There is mild left ventricular hypertrophy.  Left ventricular diastolic  parameters are consistent with Grade I diastolic dysfunction (impaired  relaxation).  3. Right ventricular systolic function is normal. The right ventricular  size is normal. There is normal pulmonary artery systolic pressure.  4. The mitral valve is normal in structure. No evidence of mitral valve  regurgitation. No evidence of mitral stenosis.  5. The aortic valve is tricuspid. Aortic valve regurgitation is not  visualized. Mild aortic valve sclerosis is present, with no evidence of  aortic valve stenosis.  6. The inferior vena cava is normal in size with greater than 50%  respiratory variability, suggesting right atrial pressure of 3 mmHg.   Assessment / Plan: 1. Coronary disease. S/p remote stenting of the proximal LAD with Cypher stent in 2005.  Cardiac catheterization 11/05/18  showed nonobstructive disease. Recommend stopping ASA since he is on Eliquis.   2. Dyslipidemia LDL at goal. Continue statin. Labs followed by Dr Reynaldo Minium.   3. Hypertension. Well controlled.   4. SVT controlled on metoprolol  5. S/p lap choly in [place] 2020  6. Atrial fibrillation. Triggered by stress of acute food poisoning. Converted to NSR on amiodarone. [place] vasc score of 3-4. On anticoagulation with Eliquis. Amiodarone previously stopped due to fatigue and nausea which did not improve. It appears he is maintaining NSR.    Follow up in 6 months.

## 2021-02-24 DIAGNOSIS — R634 Abnormal weight loss: Secondary | ICD-10-CM | POA: Diagnosis not present

## 2021-02-24 DIAGNOSIS — I119 Hypertensive heart disease without heart failure: Secondary | ICD-10-CM | POA: Diagnosis not present

## 2021-02-24 DIAGNOSIS — E039 Hypothyroidism, unspecified: Secondary | ICD-10-CM | POA: Diagnosis not present

## 2021-02-24 DIAGNOSIS — K219 Gastro-esophageal reflux disease without esophagitis: Secondary | ICD-10-CM | POA: Diagnosis not present

## 2021-02-24 DIAGNOSIS — F32A Depression, unspecified: Secondary | ICD-10-CM | POA: Diagnosis not present

## 2021-02-24 DIAGNOSIS — M1990 Unspecified osteoarthritis, unspecified site: Secondary | ICD-10-CM | POA: Diagnosis not present

## 2021-02-24 DIAGNOSIS — E785 Hyperlipidemia, unspecified: Secondary | ICD-10-CM | POA: Diagnosis not present

## 2021-02-24 DIAGNOSIS — I482 Chronic atrial fibrillation, unspecified: Secondary | ICD-10-CM | POA: Diagnosis not present

## 2021-02-24 DIAGNOSIS — F419 Anxiety disorder, unspecified: Secondary | ICD-10-CM | POA: Diagnosis not present

## 2021-02-25 ENCOUNTER — Ambulatory Visit: Payer: Medicare Other | Admitting: Cardiology

## 2021-02-26 DIAGNOSIS — F419 Anxiety disorder, unspecified: Secondary | ICD-10-CM | POA: Diagnosis not present

## 2021-02-26 DIAGNOSIS — I119 Hypertensive heart disease without heart failure: Secondary | ICD-10-CM | POA: Diagnosis not present

## 2021-02-26 DIAGNOSIS — F32A Depression, unspecified: Secondary | ICD-10-CM | POA: Diagnosis not present

## 2021-02-26 DIAGNOSIS — I482 Chronic atrial fibrillation, unspecified: Secondary | ICD-10-CM | POA: Diagnosis not present

## 2021-02-26 DIAGNOSIS — R634 Abnormal weight loss: Secondary | ICD-10-CM | POA: Diagnosis not present

## 2021-02-26 DIAGNOSIS — E785 Hyperlipidemia, unspecified: Secondary | ICD-10-CM | POA: Diagnosis not present

## 2021-02-27 ENCOUNTER — Other Ambulatory Visit: Payer: Self-pay | Admitting: Cardiology

## 2021-03-05 DIAGNOSIS — I119 Hypertensive heart disease without heart failure: Secondary | ICD-10-CM | POA: Diagnosis not present

## 2021-03-05 DIAGNOSIS — I482 Chronic atrial fibrillation, unspecified: Secondary | ICD-10-CM | POA: Diagnosis not present

## 2021-03-05 DIAGNOSIS — E785 Hyperlipidemia, unspecified: Secondary | ICD-10-CM | POA: Diagnosis not present

## 2021-03-05 DIAGNOSIS — F419 Anxiety disorder, unspecified: Secondary | ICD-10-CM | POA: Diagnosis not present

## 2021-03-05 DIAGNOSIS — F32A Depression, unspecified: Secondary | ICD-10-CM | POA: Diagnosis not present

## 2021-03-05 DIAGNOSIS — R634 Abnormal weight loss: Secondary | ICD-10-CM | POA: Diagnosis not present

## 2021-03-15 DIAGNOSIS — E785 Hyperlipidemia, unspecified: Secondary | ICD-10-CM | POA: Diagnosis not present

## 2021-03-15 DIAGNOSIS — I119 Hypertensive heart disease without heart failure: Secondary | ICD-10-CM | POA: Diagnosis not present

## 2021-03-15 DIAGNOSIS — F32A Depression, unspecified: Secondary | ICD-10-CM | POA: Diagnosis not present

## 2021-03-15 DIAGNOSIS — F419 Anxiety disorder, unspecified: Secondary | ICD-10-CM | POA: Diagnosis not present

## 2021-03-15 DIAGNOSIS — R634 Abnormal weight loss: Secondary | ICD-10-CM | POA: Diagnosis not present

## 2021-03-15 DIAGNOSIS — I482 Chronic atrial fibrillation, unspecified: Secondary | ICD-10-CM | POA: Diagnosis not present

## 2021-03-19 DIAGNOSIS — E785 Hyperlipidemia, unspecified: Secondary | ICD-10-CM | POA: Diagnosis not present

## 2021-03-19 DIAGNOSIS — I119 Hypertensive heart disease without heart failure: Secondary | ICD-10-CM | POA: Diagnosis not present

## 2021-03-19 DIAGNOSIS — F419 Anxiety disorder, unspecified: Secondary | ICD-10-CM | POA: Diagnosis not present

## 2021-03-19 DIAGNOSIS — F32A Depression, unspecified: Secondary | ICD-10-CM | POA: Diagnosis not present

## 2021-03-19 DIAGNOSIS — R634 Abnormal weight loss: Secondary | ICD-10-CM | POA: Diagnosis not present

## 2021-03-19 DIAGNOSIS — I482 Chronic atrial fibrillation, unspecified: Secondary | ICD-10-CM | POA: Diagnosis not present

## 2021-03-26 DIAGNOSIS — R634 Abnormal weight loss: Secondary | ICD-10-CM | POA: Diagnosis not present

## 2021-03-26 DIAGNOSIS — I119 Hypertensive heart disease without heart failure: Secondary | ICD-10-CM | POA: Diagnosis not present

## 2021-03-26 DIAGNOSIS — I482 Chronic atrial fibrillation, unspecified: Secondary | ICD-10-CM | POA: Diagnosis not present

## 2021-03-26 DIAGNOSIS — F32A Depression, unspecified: Secondary | ICD-10-CM | POA: Diagnosis not present

## 2021-03-26 DIAGNOSIS — F419 Anxiety disorder, unspecified: Secondary | ICD-10-CM | POA: Diagnosis not present

## 2021-03-26 DIAGNOSIS — E785 Hyperlipidemia, unspecified: Secondary | ICD-10-CM | POA: Diagnosis not present

## 2021-03-27 DIAGNOSIS — E039 Hypothyroidism, unspecified: Secondary | ICD-10-CM | POA: Diagnosis not present

## 2021-03-27 DIAGNOSIS — I482 Chronic atrial fibrillation, unspecified: Secondary | ICD-10-CM | POA: Diagnosis not present

## 2021-03-27 DIAGNOSIS — K219 Gastro-esophageal reflux disease without esophagitis: Secondary | ICD-10-CM | POA: Diagnosis not present

## 2021-03-27 DIAGNOSIS — M1990 Unspecified osteoarthritis, unspecified site: Secondary | ICD-10-CM | POA: Diagnosis not present

## 2021-03-27 DIAGNOSIS — E785 Hyperlipidemia, unspecified: Secondary | ICD-10-CM | POA: Diagnosis not present

## 2021-03-27 DIAGNOSIS — R634 Abnormal weight loss: Secondary | ICD-10-CM | POA: Diagnosis not present

## 2021-03-27 DIAGNOSIS — F32A Depression, unspecified: Secondary | ICD-10-CM | POA: Diagnosis not present

## 2021-03-27 DIAGNOSIS — I119 Hypertensive heart disease without heart failure: Secondary | ICD-10-CM | POA: Diagnosis not present

## 2021-03-27 DIAGNOSIS — F419 Anxiety disorder, unspecified: Secondary | ICD-10-CM | POA: Diagnosis not present

## 2021-04-01 DIAGNOSIS — F32A Depression, unspecified: Secondary | ICD-10-CM | POA: Diagnosis not present

## 2021-04-01 DIAGNOSIS — F419 Anxiety disorder, unspecified: Secondary | ICD-10-CM | POA: Diagnosis not present

## 2021-04-01 DIAGNOSIS — I119 Hypertensive heart disease without heart failure: Secondary | ICD-10-CM | POA: Diagnosis not present

## 2021-04-01 DIAGNOSIS — I482 Chronic atrial fibrillation, unspecified: Secondary | ICD-10-CM | POA: Diagnosis not present

## 2021-04-01 DIAGNOSIS — E785 Hyperlipidemia, unspecified: Secondary | ICD-10-CM | POA: Diagnosis not present

## 2021-04-01 DIAGNOSIS — R634 Abnormal weight loss: Secondary | ICD-10-CM | POA: Diagnosis not present

## 2021-04-02 DIAGNOSIS — F32A Depression, unspecified: Secondary | ICD-10-CM | POA: Diagnosis not present

## 2021-04-02 DIAGNOSIS — R634 Abnormal weight loss: Secondary | ICD-10-CM | POA: Diagnosis not present

## 2021-04-02 DIAGNOSIS — E785 Hyperlipidemia, unspecified: Secondary | ICD-10-CM | POA: Diagnosis not present

## 2021-04-02 DIAGNOSIS — I119 Hypertensive heart disease without heart failure: Secondary | ICD-10-CM | POA: Diagnosis not present

## 2021-04-02 DIAGNOSIS — I482 Chronic atrial fibrillation, unspecified: Secondary | ICD-10-CM | POA: Diagnosis not present

## 2021-04-02 DIAGNOSIS — F419 Anxiety disorder, unspecified: Secondary | ICD-10-CM | POA: Diagnosis not present

## 2021-04-04 ENCOUNTER — Other Ambulatory Visit: Payer: Self-pay | Admitting: Cardiology

## 2021-04-09 DIAGNOSIS — F32A Depression, unspecified: Secondary | ICD-10-CM | POA: Diagnosis not present

## 2021-04-09 DIAGNOSIS — I119 Hypertensive heart disease without heart failure: Secondary | ICD-10-CM | POA: Diagnosis not present

## 2021-04-09 DIAGNOSIS — I482 Chronic atrial fibrillation, unspecified: Secondary | ICD-10-CM | POA: Diagnosis not present

## 2021-04-09 DIAGNOSIS — M25561 Pain in right knee: Secondary | ICD-10-CM | POA: Diagnosis not present

## 2021-04-09 DIAGNOSIS — R634 Abnormal weight loss: Secondary | ICD-10-CM | POA: Diagnosis not present

## 2021-04-09 DIAGNOSIS — F419 Anxiety disorder, unspecified: Secondary | ICD-10-CM | POA: Diagnosis not present

## 2021-04-09 DIAGNOSIS — E785 Hyperlipidemia, unspecified: Secondary | ICD-10-CM | POA: Diagnosis not present

## 2021-04-19 ENCOUNTER — Other Ambulatory Visit: Payer: Self-pay | Admitting: Cardiology

## 2021-04-24 DIAGNOSIS — I482 Chronic atrial fibrillation, unspecified: Secondary | ICD-10-CM | POA: Diagnosis not present

## 2021-04-24 DIAGNOSIS — R634 Abnormal weight loss: Secondary | ICD-10-CM | POA: Diagnosis not present

## 2021-04-24 DIAGNOSIS — F32A Depression, unspecified: Secondary | ICD-10-CM | POA: Diagnosis not present

## 2021-04-24 DIAGNOSIS — F419 Anxiety disorder, unspecified: Secondary | ICD-10-CM | POA: Diagnosis not present

## 2021-04-24 DIAGNOSIS — E785 Hyperlipidemia, unspecified: Secondary | ICD-10-CM | POA: Diagnosis not present

## 2021-04-24 DIAGNOSIS — I119 Hypertensive heart disease without heart failure: Secondary | ICD-10-CM | POA: Diagnosis not present

## 2021-04-26 DIAGNOSIS — E039 Hypothyroidism, unspecified: Secondary | ICD-10-CM | POA: Diagnosis not present

## 2021-04-26 DIAGNOSIS — F32A Depression, unspecified: Secondary | ICD-10-CM | POA: Diagnosis not present

## 2021-04-26 DIAGNOSIS — K219 Gastro-esophageal reflux disease without esophagitis: Secondary | ICD-10-CM | POA: Diagnosis not present

## 2021-04-26 DIAGNOSIS — E785 Hyperlipidemia, unspecified: Secondary | ICD-10-CM | POA: Diagnosis not present

## 2021-04-26 DIAGNOSIS — I482 Chronic atrial fibrillation, unspecified: Secondary | ICD-10-CM | POA: Diagnosis not present

## 2021-04-26 DIAGNOSIS — F419 Anxiety disorder, unspecified: Secondary | ICD-10-CM | POA: Diagnosis not present

## 2021-04-26 DIAGNOSIS — M1990 Unspecified osteoarthritis, unspecified site: Secondary | ICD-10-CM | POA: Diagnosis not present

## 2021-04-26 DIAGNOSIS — R634 Abnormal weight loss: Secondary | ICD-10-CM | POA: Diagnosis not present

## 2021-04-26 DIAGNOSIS — I119 Hypertensive heart disease without heart failure: Secondary | ICD-10-CM | POA: Diagnosis not present

## 2021-04-30 DIAGNOSIS — I482 Chronic atrial fibrillation, unspecified: Secondary | ICD-10-CM | POA: Diagnosis not present

## 2021-04-30 DIAGNOSIS — I119 Hypertensive heart disease without heart failure: Secondary | ICD-10-CM | POA: Diagnosis not present

## 2021-04-30 DIAGNOSIS — F32A Depression, unspecified: Secondary | ICD-10-CM | POA: Diagnosis not present

## 2021-04-30 DIAGNOSIS — R634 Abnormal weight loss: Secondary | ICD-10-CM | POA: Diagnosis not present

## 2021-04-30 DIAGNOSIS — F419 Anxiety disorder, unspecified: Secondary | ICD-10-CM | POA: Diagnosis not present

## 2021-04-30 DIAGNOSIS — E785 Hyperlipidemia, unspecified: Secondary | ICD-10-CM | POA: Diagnosis not present

## 2021-05-03 DIAGNOSIS — E785 Hyperlipidemia, unspecified: Secondary | ICD-10-CM | POA: Diagnosis not present

## 2021-05-03 DIAGNOSIS — F419 Anxiety disorder, unspecified: Secondary | ICD-10-CM | POA: Diagnosis not present

## 2021-05-03 DIAGNOSIS — R634 Abnormal weight loss: Secondary | ICD-10-CM | POA: Diagnosis not present

## 2021-05-03 DIAGNOSIS — F32A Depression, unspecified: Secondary | ICD-10-CM | POA: Diagnosis not present

## 2021-05-03 DIAGNOSIS — I482 Chronic atrial fibrillation, unspecified: Secondary | ICD-10-CM | POA: Diagnosis not present

## 2021-05-03 DIAGNOSIS — I119 Hypertensive heart disease without heart failure: Secondary | ICD-10-CM | POA: Diagnosis not present

## 2021-05-08 DIAGNOSIS — I119 Hypertensive heart disease without heart failure: Secondary | ICD-10-CM | POA: Diagnosis not present

## 2021-05-08 DIAGNOSIS — I482 Chronic atrial fibrillation, unspecified: Secondary | ICD-10-CM | POA: Diagnosis not present

## 2021-05-08 DIAGNOSIS — F32A Depression, unspecified: Secondary | ICD-10-CM | POA: Diagnosis not present

## 2021-05-08 DIAGNOSIS — R634 Abnormal weight loss: Secondary | ICD-10-CM | POA: Diagnosis not present

## 2021-05-08 DIAGNOSIS — F419 Anxiety disorder, unspecified: Secondary | ICD-10-CM | POA: Diagnosis not present

## 2021-05-08 DIAGNOSIS — E785 Hyperlipidemia, unspecified: Secondary | ICD-10-CM | POA: Diagnosis not present

## 2021-05-10 DIAGNOSIS — I119 Hypertensive heart disease without heart failure: Secondary | ICD-10-CM | POA: Diagnosis not present

## 2021-05-10 DIAGNOSIS — R634 Abnormal weight loss: Secondary | ICD-10-CM | POA: Diagnosis not present

## 2021-05-10 DIAGNOSIS — F419 Anxiety disorder, unspecified: Secondary | ICD-10-CM | POA: Diagnosis not present

## 2021-05-10 DIAGNOSIS — F32A Depression, unspecified: Secondary | ICD-10-CM | POA: Diagnosis not present

## 2021-05-10 DIAGNOSIS — E785 Hyperlipidemia, unspecified: Secondary | ICD-10-CM | POA: Diagnosis not present

## 2021-05-10 DIAGNOSIS — I482 Chronic atrial fibrillation, unspecified: Secondary | ICD-10-CM | POA: Diagnosis not present

## 2021-05-14 DIAGNOSIS — I482 Chronic atrial fibrillation, unspecified: Secondary | ICD-10-CM | POA: Diagnosis not present

## 2021-05-14 DIAGNOSIS — F419 Anxiety disorder, unspecified: Secondary | ICD-10-CM | POA: Diagnosis not present

## 2021-05-14 DIAGNOSIS — R634 Abnormal weight loss: Secondary | ICD-10-CM | POA: Diagnosis not present

## 2021-05-14 DIAGNOSIS — E785 Hyperlipidemia, unspecified: Secondary | ICD-10-CM | POA: Diagnosis not present

## 2021-05-14 DIAGNOSIS — I119 Hypertensive heart disease without heart failure: Secondary | ICD-10-CM | POA: Diagnosis not present

## 2021-05-14 DIAGNOSIS — F32A Depression, unspecified: Secondary | ICD-10-CM | POA: Diagnosis not present

## 2021-05-21 DIAGNOSIS — I119 Hypertensive heart disease without heart failure: Secondary | ICD-10-CM | POA: Diagnosis not present

## 2021-05-21 DIAGNOSIS — R634 Abnormal weight loss: Secondary | ICD-10-CM | POA: Diagnosis not present

## 2021-05-21 DIAGNOSIS — E785 Hyperlipidemia, unspecified: Secondary | ICD-10-CM | POA: Diagnosis not present

## 2021-05-21 DIAGNOSIS — I482 Chronic atrial fibrillation, unspecified: Secondary | ICD-10-CM | POA: Diagnosis not present

## 2021-05-21 DIAGNOSIS — F419 Anxiety disorder, unspecified: Secondary | ICD-10-CM | POA: Diagnosis not present

## 2021-05-21 DIAGNOSIS — F32A Depression, unspecified: Secondary | ICD-10-CM | POA: Diagnosis not present

## 2021-05-27 DIAGNOSIS — I69351 Hemiplegia and hemiparesis following cerebral infarction affecting right dominant side: Secondary | ICD-10-CM | POA: Diagnosis not present

## 2021-05-27 DIAGNOSIS — F419 Anxiety disorder, unspecified: Secondary | ICD-10-CM | POA: Diagnosis not present

## 2021-05-27 DIAGNOSIS — R634 Abnormal weight loss: Secondary | ICD-10-CM | POA: Diagnosis not present

## 2021-05-27 DIAGNOSIS — E785 Hyperlipidemia, unspecified: Secondary | ICD-10-CM | POA: Diagnosis not present

## 2021-05-27 DIAGNOSIS — R296 Repeated falls: Secondary | ICD-10-CM | POA: Diagnosis not present

## 2021-05-27 DIAGNOSIS — I119 Hypertensive heart disease without heart failure: Secondary | ICD-10-CM | POA: Diagnosis not present

## 2021-05-27 DIAGNOSIS — M1990 Unspecified osteoarthritis, unspecified site: Secondary | ICD-10-CM | POA: Diagnosis not present

## 2021-05-27 DIAGNOSIS — I482 Chronic atrial fibrillation, unspecified: Secondary | ICD-10-CM | POA: Diagnosis not present

## 2021-05-27 DIAGNOSIS — K219 Gastro-esophageal reflux disease without esophagitis: Secondary | ICD-10-CM | POA: Diagnosis not present

## 2021-05-27 DIAGNOSIS — E039 Hypothyroidism, unspecified: Secondary | ICD-10-CM | POA: Diagnosis not present

## 2021-05-27 DIAGNOSIS — F32A Depression, unspecified: Secondary | ICD-10-CM | POA: Diagnosis not present

## 2021-05-27 DIAGNOSIS — I69391 Dysphagia following cerebral infarction: Secondary | ICD-10-CM | POA: Diagnosis not present

## 2021-05-28 DIAGNOSIS — E785 Hyperlipidemia, unspecified: Secondary | ICD-10-CM | POA: Diagnosis not present

## 2021-05-28 DIAGNOSIS — I482 Chronic atrial fibrillation, unspecified: Secondary | ICD-10-CM | POA: Diagnosis not present

## 2021-05-28 DIAGNOSIS — I119 Hypertensive heart disease without heart failure: Secondary | ICD-10-CM | POA: Diagnosis not present

## 2021-05-28 DIAGNOSIS — R634 Abnormal weight loss: Secondary | ICD-10-CM | POA: Diagnosis not present

## 2021-05-28 DIAGNOSIS — F32A Depression, unspecified: Secondary | ICD-10-CM | POA: Diagnosis not present

## 2021-05-28 DIAGNOSIS — F419 Anxiety disorder, unspecified: Secondary | ICD-10-CM | POA: Diagnosis not present

## 2021-05-29 DIAGNOSIS — F32A Depression, unspecified: Secondary | ICD-10-CM | POA: Diagnosis not present

## 2021-05-29 DIAGNOSIS — F419 Anxiety disorder, unspecified: Secondary | ICD-10-CM | POA: Diagnosis not present

## 2021-05-29 DIAGNOSIS — I119 Hypertensive heart disease without heart failure: Secondary | ICD-10-CM | POA: Diagnosis not present

## 2021-05-29 DIAGNOSIS — I482 Chronic atrial fibrillation, unspecified: Secondary | ICD-10-CM | POA: Diagnosis not present

## 2021-05-29 DIAGNOSIS — E785 Hyperlipidemia, unspecified: Secondary | ICD-10-CM | POA: Diagnosis not present

## 2021-05-29 DIAGNOSIS — R634 Abnormal weight loss: Secondary | ICD-10-CM | POA: Diagnosis not present

## 2021-06-04 DIAGNOSIS — I119 Hypertensive heart disease without heart failure: Secondary | ICD-10-CM | POA: Diagnosis not present

## 2021-06-04 DIAGNOSIS — I482 Chronic atrial fibrillation, unspecified: Secondary | ICD-10-CM | POA: Diagnosis not present

## 2021-06-04 DIAGNOSIS — R634 Abnormal weight loss: Secondary | ICD-10-CM | POA: Diagnosis not present

## 2021-06-04 DIAGNOSIS — F32A Depression, unspecified: Secondary | ICD-10-CM | POA: Diagnosis not present

## 2021-06-04 DIAGNOSIS — F419 Anxiety disorder, unspecified: Secondary | ICD-10-CM | POA: Diagnosis not present

## 2021-06-04 DIAGNOSIS — E785 Hyperlipidemia, unspecified: Secondary | ICD-10-CM | POA: Diagnosis not present

## 2021-06-18 DIAGNOSIS — I482 Chronic atrial fibrillation, unspecified: Secondary | ICD-10-CM | POA: Diagnosis not present

## 2021-06-18 DIAGNOSIS — F32A Depression, unspecified: Secondary | ICD-10-CM | POA: Diagnosis not present

## 2021-06-18 DIAGNOSIS — E785 Hyperlipidemia, unspecified: Secondary | ICD-10-CM | POA: Diagnosis not present

## 2021-06-18 DIAGNOSIS — I119 Hypertensive heart disease without heart failure: Secondary | ICD-10-CM | POA: Diagnosis not present

## 2021-06-18 DIAGNOSIS — R634 Abnormal weight loss: Secondary | ICD-10-CM | POA: Diagnosis not present

## 2021-06-18 DIAGNOSIS — F419 Anxiety disorder, unspecified: Secondary | ICD-10-CM | POA: Diagnosis not present

## 2021-06-26 DIAGNOSIS — F419 Anxiety disorder, unspecified: Secondary | ICD-10-CM | POA: Diagnosis not present

## 2021-06-26 DIAGNOSIS — F32A Depression, unspecified: Secondary | ICD-10-CM | POA: Diagnosis not present

## 2021-06-26 DIAGNOSIS — E785 Hyperlipidemia, unspecified: Secondary | ICD-10-CM | POA: Diagnosis not present

## 2021-06-26 DIAGNOSIS — R634 Abnormal weight loss: Secondary | ICD-10-CM | POA: Diagnosis not present

## 2021-06-26 DIAGNOSIS — I482 Chronic atrial fibrillation, unspecified: Secondary | ICD-10-CM | POA: Diagnosis not present

## 2021-06-26 DIAGNOSIS — I119 Hypertensive heart disease without heart failure: Secondary | ICD-10-CM | POA: Diagnosis not present

## 2021-06-27 DIAGNOSIS — I69391 Dysphagia following cerebral infarction: Secondary | ICD-10-CM | POA: Diagnosis not present

## 2021-06-27 DIAGNOSIS — E039 Hypothyroidism, unspecified: Secondary | ICD-10-CM | POA: Diagnosis not present

## 2021-06-27 DIAGNOSIS — R634 Abnormal weight loss: Secondary | ICD-10-CM | POA: Diagnosis not present

## 2021-06-27 DIAGNOSIS — F32A Depression, unspecified: Secondary | ICD-10-CM | POA: Diagnosis not present

## 2021-06-27 DIAGNOSIS — I482 Chronic atrial fibrillation, unspecified: Secondary | ICD-10-CM | POA: Diagnosis not present

## 2021-06-27 DIAGNOSIS — K219 Gastro-esophageal reflux disease without esophagitis: Secondary | ICD-10-CM | POA: Diagnosis not present

## 2021-06-27 DIAGNOSIS — F419 Anxiety disorder, unspecified: Secondary | ICD-10-CM | POA: Diagnosis not present

## 2021-06-27 DIAGNOSIS — I69351 Hemiplegia and hemiparesis following cerebral infarction affecting right dominant side: Secondary | ICD-10-CM | POA: Diagnosis not present

## 2021-06-27 DIAGNOSIS — R296 Repeated falls: Secondary | ICD-10-CM | POA: Diagnosis not present

## 2021-06-27 DIAGNOSIS — M1990 Unspecified osteoarthritis, unspecified site: Secondary | ICD-10-CM | POA: Diagnosis not present

## 2021-06-27 DIAGNOSIS — I119 Hypertensive heart disease without heart failure: Secondary | ICD-10-CM | POA: Diagnosis not present

## 2021-06-27 DIAGNOSIS — E785 Hyperlipidemia, unspecified: Secondary | ICD-10-CM | POA: Diagnosis not present

## 2021-07-07 DIAGNOSIS — I119 Hypertensive heart disease without heart failure: Secondary | ICD-10-CM | POA: Diagnosis not present

## 2021-07-07 DIAGNOSIS — E785 Hyperlipidemia, unspecified: Secondary | ICD-10-CM | POA: Diagnosis not present

## 2021-07-07 DIAGNOSIS — I482 Chronic atrial fibrillation, unspecified: Secondary | ICD-10-CM | POA: Diagnosis not present

## 2021-07-07 DIAGNOSIS — R634 Abnormal weight loss: Secondary | ICD-10-CM | POA: Diagnosis not present

## 2021-07-07 DIAGNOSIS — F32A Depression, unspecified: Secondary | ICD-10-CM | POA: Diagnosis not present

## 2021-07-07 DIAGNOSIS — F419 Anxiety disorder, unspecified: Secondary | ICD-10-CM | POA: Diagnosis not present

## 2021-07-08 DIAGNOSIS — F32A Depression, unspecified: Secondary | ICD-10-CM | POA: Diagnosis not present

## 2021-07-08 DIAGNOSIS — E785 Hyperlipidemia, unspecified: Secondary | ICD-10-CM | POA: Diagnosis not present

## 2021-07-08 DIAGNOSIS — I482 Chronic atrial fibrillation, unspecified: Secondary | ICD-10-CM | POA: Diagnosis not present

## 2021-07-08 DIAGNOSIS — I119 Hypertensive heart disease without heart failure: Secondary | ICD-10-CM | POA: Diagnosis not present

## 2021-07-08 DIAGNOSIS — R634 Abnormal weight loss: Secondary | ICD-10-CM | POA: Diagnosis not present

## 2021-07-08 DIAGNOSIS — F419 Anxiety disorder, unspecified: Secondary | ICD-10-CM | POA: Diagnosis not present

## 2021-07-23 DIAGNOSIS — E785 Hyperlipidemia, unspecified: Secondary | ICD-10-CM | POA: Diagnosis not present

## 2021-07-23 DIAGNOSIS — I482 Chronic atrial fibrillation, unspecified: Secondary | ICD-10-CM | POA: Diagnosis not present

## 2021-07-23 DIAGNOSIS — F32A Depression, unspecified: Secondary | ICD-10-CM | POA: Diagnosis not present

## 2021-07-23 DIAGNOSIS — F419 Anxiety disorder, unspecified: Secondary | ICD-10-CM | POA: Diagnosis not present

## 2021-07-23 DIAGNOSIS — R634 Abnormal weight loss: Secondary | ICD-10-CM | POA: Diagnosis not present

## 2021-07-23 DIAGNOSIS — I119 Hypertensive heart disease without heart failure: Secondary | ICD-10-CM | POA: Diagnosis not present

## 2021-07-25 DIAGNOSIS — F32A Depression, unspecified: Secondary | ICD-10-CM | POA: Diagnosis not present

## 2021-07-25 DIAGNOSIS — I119 Hypertensive heart disease without heart failure: Secondary | ICD-10-CM | POA: Diagnosis not present

## 2021-07-25 DIAGNOSIS — R634 Abnormal weight loss: Secondary | ICD-10-CM | POA: Diagnosis not present

## 2021-07-25 DIAGNOSIS — E785 Hyperlipidemia, unspecified: Secondary | ICD-10-CM | POA: Diagnosis not present

## 2021-07-25 DIAGNOSIS — F419 Anxiety disorder, unspecified: Secondary | ICD-10-CM | POA: Diagnosis not present

## 2021-07-25 DIAGNOSIS — I482 Chronic atrial fibrillation, unspecified: Secondary | ICD-10-CM | POA: Diagnosis not present

## 2021-07-27 DIAGNOSIS — I119 Hypertensive heart disease without heart failure: Secondary | ICD-10-CM | POA: Diagnosis not present

## 2021-07-27 DIAGNOSIS — I69351 Hemiplegia and hemiparesis following cerebral infarction affecting right dominant side: Secondary | ICD-10-CM | POA: Diagnosis not present

## 2021-07-27 DIAGNOSIS — K219 Gastro-esophageal reflux disease without esophagitis: Secondary | ICD-10-CM | POA: Diagnosis not present

## 2021-07-27 DIAGNOSIS — R634 Abnormal weight loss: Secondary | ICD-10-CM | POA: Diagnosis not present

## 2021-07-27 DIAGNOSIS — I482 Chronic atrial fibrillation, unspecified: Secondary | ICD-10-CM | POA: Diagnosis not present

## 2021-07-27 DIAGNOSIS — F419 Anxiety disorder, unspecified: Secondary | ICD-10-CM | POA: Diagnosis not present

## 2021-07-27 DIAGNOSIS — E039 Hypothyroidism, unspecified: Secondary | ICD-10-CM | POA: Diagnosis not present

## 2021-07-27 DIAGNOSIS — E785 Hyperlipidemia, unspecified: Secondary | ICD-10-CM | POA: Diagnosis not present

## 2021-07-27 DIAGNOSIS — I69391 Dysphagia following cerebral infarction: Secondary | ICD-10-CM | POA: Diagnosis not present

## 2021-07-27 DIAGNOSIS — F32A Depression, unspecified: Secondary | ICD-10-CM | POA: Diagnosis not present

## 2021-07-27 DIAGNOSIS — R296 Repeated falls: Secondary | ICD-10-CM | POA: Diagnosis not present

## 2021-07-27 DIAGNOSIS — M1990 Unspecified osteoarthritis, unspecified site: Secondary | ICD-10-CM | POA: Diagnosis not present

## 2021-07-29 DIAGNOSIS — I482 Chronic atrial fibrillation, unspecified: Secondary | ICD-10-CM | POA: Diagnosis not present

## 2021-07-29 DIAGNOSIS — E785 Hyperlipidemia, unspecified: Secondary | ICD-10-CM | POA: Diagnosis not present

## 2021-07-29 DIAGNOSIS — F32A Depression, unspecified: Secondary | ICD-10-CM | POA: Diagnosis not present

## 2021-07-29 DIAGNOSIS — R634 Abnormal weight loss: Secondary | ICD-10-CM | POA: Diagnosis not present

## 2021-07-29 DIAGNOSIS — F419 Anxiety disorder, unspecified: Secondary | ICD-10-CM | POA: Diagnosis not present

## 2021-07-29 DIAGNOSIS — I119 Hypertensive heart disease without heart failure: Secondary | ICD-10-CM | POA: Diagnosis not present

## 2021-08-08 DIAGNOSIS — I482 Chronic atrial fibrillation, unspecified: Secondary | ICD-10-CM | POA: Diagnosis not present

## 2021-08-08 DIAGNOSIS — F32A Depression, unspecified: Secondary | ICD-10-CM | POA: Diagnosis not present

## 2021-08-08 DIAGNOSIS — E785 Hyperlipidemia, unspecified: Secondary | ICD-10-CM | POA: Diagnosis not present

## 2021-08-08 DIAGNOSIS — R634 Abnormal weight loss: Secondary | ICD-10-CM | POA: Diagnosis not present

## 2021-08-08 DIAGNOSIS — I119 Hypertensive heart disease without heart failure: Secondary | ICD-10-CM | POA: Diagnosis not present

## 2021-08-08 DIAGNOSIS — F419 Anxiety disorder, unspecified: Secondary | ICD-10-CM | POA: Diagnosis not present

## 2021-08-11 DIAGNOSIS — I119 Hypertensive heart disease without heart failure: Secondary | ICD-10-CM | POA: Diagnosis not present

## 2021-08-11 DIAGNOSIS — F32A Depression, unspecified: Secondary | ICD-10-CM | POA: Diagnosis not present

## 2021-08-11 DIAGNOSIS — E785 Hyperlipidemia, unspecified: Secondary | ICD-10-CM | POA: Diagnosis not present

## 2021-08-11 DIAGNOSIS — I482 Chronic atrial fibrillation, unspecified: Secondary | ICD-10-CM | POA: Diagnosis not present

## 2021-08-11 DIAGNOSIS — R634 Abnormal weight loss: Secondary | ICD-10-CM | POA: Diagnosis not present

## 2021-08-11 DIAGNOSIS — F419 Anxiety disorder, unspecified: Secondary | ICD-10-CM | POA: Diagnosis not present

## 2021-08-12 DIAGNOSIS — R634 Abnormal weight loss: Secondary | ICD-10-CM | POA: Diagnosis not present

## 2021-08-12 DIAGNOSIS — F32A Depression, unspecified: Secondary | ICD-10-CM | POA: Diagnosis not present

## 2021-08-12 DIAGNOSIS — F419 Anxiety disorder, unspecified: Secondary | ICD-10-CM | POA: Diagnosis not present

## 2021-08-12 DIAGNOSIS — I119 Hypertensive heart disease without heart failure: Secondary | ICD-10-CM | POA: Diagnosis not present

## 2021-08-12 DIAGNOSIS — I482 Chronic atrial fibrillation, unspecified: Secondary | ICD-10-CM | POA: Diagnosis not present

## 2021-08-12 DIAGNOSIS — E785 Hyperlipidemia, unspecified: Secondary | ICD-10-CM | POA: Diagnosis not present

## 2021-08-14 DIAGNOSIS — I482 Chronic atrial fibrillation, unspecified: Secondary | ICD-10-CM | POA: Diagnosis not present

## 2021-08-14 DIAGNOSIS — I119 Hypertensive heart disease without heart failure: Secondary | ICD-10-CM | POA: Diagnosis not present

## 2021-08-14 DIAGNOSIS — F419 Anxiety disorder, unspecified: Secondary | ICD-10-CM | POA: Diagnosis not present

## 2021-08-14 DIAGNOSIS — R634 Abnormal weight loss: Secondary | ICD-10-CM | POA: Diagnosis not present

## 2021-08-14 DIAGNOSIS — E785 Hyperlipidemia, unspecified: Secondary | ICD-10-CM | POA: Diagnosis not present

## 2021-08-14 DIAGNOSIS — F32A Depression, unspecified: Secondary | ICD-10-CM | POA: Diagnosis not present

## 2021-08-15 DIAGNOSIS — I482 Chronic atrial fibrillation, unspecified: Secondary | ICD-10-CM | POA: Diagnosis not present

## 2021-08-15 DIAGNOSIS — I119 Hypertensive heart disease without heart failure: Secondary | ICD-10-CM | POA: Diagnosis not present

## 2021-08-15 DIAGNOSIS — F32A Depression, unspecified: Secondary | ICD-10-CM | POA: Diagnosis not present

## 2021-08-15 DIAGNOSIS — R634 Abnormal weight loss: Secondary | ICD-10-CM | POA: Diagnosis not present

## 2021-08-15 DIAGNOSIS — F419 Anxiety disorder, unspecified: Secondary | ICD-10-CM | POA: Diagnosis not present

## 2021-08-15 DIAGNOSIS — E785 Hyperlipidemia, unspecified: Secondary | ICD-10-CM | POA: Diagnosis not present

## 2021-08-16 DIAGNOSIS — I119 Hypertensive heart disease without heart failure: Secondary | ICD-10-CM | POA: Diagnosis not present

## 2021-08-16 DIAGNOSIS — F419 Anxiety disorder, unspecified: Secondary | ICD-10-CM | POA: Diagnosis not present

## 2021-08-16 DIAGNOSIS — E785 Hyperlipidemia, unspecified: Secondary | ICD-10-CM | POA: Diagnosis not present

## 2021-08-16 DIAGNOSIS — R634 Abnormal weight loss: Secondary | ICD-10-CM | POA: Diagnosis not present

## 2021-08-16 DIAGNOSIS — I482 Chronic atrial fibrillation, unspecified: Secondary | ICD-10-CM | POA: Diagnosis not present

## 2021-08-16 DIAGNOSIS — F32A Depression, unspecified: Secondary | ICD-10-CM | POA: Diagnosis not present

## 2021-08-27 DEATH — deceased
